# Patient Record
Sex: Male | Born: 1945 | Race: White | Hispanic: No | Marital: Married | Smoking: Never smoker
Health system: Southern US, Community
[De-identification: ages and names within clinical notes are randomized; demographics above are authoritative.]

## PROBLEM LIST (undated history)

## (undated) ENCOUNTER — Emergency Department (HOSPITAL_BASED_OUTPATIENT_CLINIC_OR_DEPARTMENT_OTHER): Payer: PPO

## (undated) DIAGNOSIS — R42 Dizziness and giddiness: Secondary | ICD-10-CM

## (undated) DIAGNOSIS — E7800 Pure hypercholesterolemia, unspecified: Secondary | ICD-10-CM

## (undated) DIAGNOSIS — R0789 Other chest pain: Secondary | ICD-10-CM

## (undated) DIAGNOSIS — R7303 Prediabetes: Secondary | ICD-10-CM

## (undated) DIAGNOSIS — N183 Chronic kidney disease, stage 3 unspecified: Secondary | ICD-10-CM

## (undated) DIAGNOSIS — I480 Paroxysmal atrial fibrillation: Secondary | ICD-10-CM

## (undated) DIAGNOSIS — I2510 Atherosclerotic heart disease of native coronary artery without angina pectoris: Secondary | ICD-10-CM

## (undated) DIAGNOSIS — L219 Seborrheic dermatitis, unspecified: Secondary | ICD-10-CM

## (undated) DIAGNOSIS — I499 Cardiac arrhythmia, unspecified: Secondary | ICD-10-CM

## (undated) DIAGNOSIS — IMO0001 Reserved for inherently not codable concepts without codable children: Secondary | ICD-10-CM

## (undated) DIAGNOSIS — K219 Gastro-esophageal reflux disease without esophagitis: Secondary | ICD-10-CM

## (undated) DIAGNOSIS — M109 Gout, unspecified: Secondary | ICD-10-CM

## (undated) DIAGNOSIS — C50911 Malignant neoplasm of unspecified site of right female breast: Secondary | ICD-10-CM

## (undated) DIAGNOSIS — M1990 Unspecified osteoarthritis, unspecified site: Secondary | ICD-10-CM

## (undated) DIAGNOSIS — I10 Essential (primary) hypertension: Secondary | ICD-10-CM

## (undated) DIAGNOSIS — D126 Benign neoplasm of colon, unspecified: Secondary | ICD-10-CM

## (undated) DIAGNOSIS — N529 Male erectile dysfunction, unspecified: Secondary | ICD-10-CM

## (undated) DIAGNOSIS — K5790 Diverticulosis of intestine, part unspecified, without perforation or abscess without bleeding: Secondary | ICD-10-CM

## (undated) DIAGNOSIS — I77810 Thoracic aortic ectasia: Secondary | ICD-10-CM

## (undated) DIAGNOSIS — I639 Cerebral infarction, unspecified: Secondary | ICD-10-CM

## (undated) DIAGNOSIS — Z973 Presence of spectacles and contact lenses: Secondary | ICD-10-CM

## (undated) DIAGNOSIS — N62 Hypertrophy of breast: Secondary | ICD-10-CM

## (undated) DIAGNOSIS — H9190 Unspecified hearing loss, unspecified ear: Secondary | ICD-10-CM

## (undated) DIAGNOSIS — B351 Tinea unguium: Secondary | ICD-10-CM

## (undated) DIAGNOSIS — Z974 Presence of external hearing-aid: Secondary | ICD-10-CM

## (undated) DIAGNOSIS — R519 Headache, unspecified: Secondary | ICD-10-CM

## (undated) DIAGNOSIS — R030 Elevated blood-pressure reading, without diagnosis of hypertension: Secondary | ICD-10-CM

## (undated) DIAGNOSIS — R51 Headache: Secondary | ICD-10-CM

## (undated) HISTORY — DX: Paroxysmal atrial fibrillation: I48.0

## (undated) HISTORY — DX: Chronic kidney disease, stage 3 (moderate): N18.3

## (undated) HISTORY — DX: Male erectile dysfunction, unspecified: N52.9

## (undated) HISTORY — DX: Unspecified osteoarthritis, unspecified site: M19.90

## (undated) HISTORY — PX: HIP ARTHROPLASTY: SHX981

## (undated) HISTORY — DX: Gout, unspecified: M10.9

## (undated) HISTORY — DX: Diverticulosis of intestine, part unspecified, without perforation or abscess without bleeding: K57.90

## (undated) HISTORY — DX: Pure hypercholesterolemia, unspecified: E78.00

## (undated) HISTORY — DX: Headache, unspecified: R51.9

## (undated) HISTORY — DX: Unspecified hearing loss, unspecified ear: H91.90

## (undated) HISTORY — DX: Gastro-esophageal reflux disease without esophagitis: K21.9

## (undated) HISTORY — DX: Elevated blood-pressure reading, without diagnosis of hypertension: R03.0

## (undated) HISTORY — DX: Dizziness and giddiness: R42

## (undated) HISTORY — DX: Thoracic aortic ectasia: I77.810

## (undated) HISTORY — DX: Atherosclerotic heart disease of native coronary artery without angina pectoris: I25.10

## (undated) HISTORY — DX: Headache: R51

## (undated) HISTORY — DX: Essential (primary) hypertension: I10

## (undated) HISTORY — DX: Chronic kidney disease, stage 3 unspecified: N18.30

## (undated) HISTORY — DX: Seborrheic dermatitis, unspecified: L21.9

## (undated) HISTORY — DX: Tinea unguium: B35.1

## (undated) HISTORY — DX: Reserved for inherently not codable concepts without codable children: IMO0001

## (undated) HISTORY — DX: Hypertrophy of breast: N62

## (undated) HISTORY — DX: Benign neoplasm of colon, unspecified: D12.6

## (undated) HISTORY — PX: KNEE ARTHROSCOPY: SUR90

## (undated) HISTORY — DX: Other chest pain: R07.89

## (undated) HISTORY — PX: OTHER SURGICAL HISTORY: SHX169

---

## 2003-09-22 ENCOUNTER — Ambulatory Visit (HOSPITAL_COMMUNITY): Admission: RE | Admit: 2003-09-22 | Discharge: 2003-09-22 | Payer: Self-pay | Admitting: Gastroenterology

## 2004-01-22 ENCOUNTER — Inpatient Hospital Stay (HOSPITAL_COMMUNITY): Admission: RE | Admit: 2004-01-22 | Discharge: 2004-01-25 | Payer: Self-pay | Admitting: Orthopedic Surgery

## 2004-08-28 ENCOUNTER — Inpatient Hospital Stay (HOSPITAL_COMMUNITY): Admission: RE | Admit: 2004-08-28 | Discharge: 2004-08-31 | Payer: Self-pay | Admitting: Orthopedic Surgery

## 2013-10-13 DIAGNOSIS — D126 Benign neoplasm of colon, unspecified: Secondary | ICD-10-CM

## 2013-10-13 HISTORY — DX: Benign neoplasm of colon, unspecified: D12.6

## 2014-08-09 ENCOUNTER — Other Ambulatory Visit: Payer: Self-pay | Admitting: Internal Medicine

## 2015-07-08 DIAGNOSIS — E785 Hyperlipidemia, unspecified: Secondary | ICD-10-CM | POA: Insufficient documentation

## 2015-07-08 DIAGNOSIS — R079 Chest pain, unspecified: Secondary | ICD-10-CM | POA: Insufficient documentation

## 2015-07-08 DIAGNOSIS — I10 Essential (primary) hypertension: Secondary | ICD-10-CM | POA: Insufficient documentation

## 2015-07-08 NOTE — Progress Notes (Signed)
Cardiology Office Note   Date:  07/11/2015   ID:  Logan Wright, DOB October 10, 1946, MRN [id number]  PCP:  Lottie Dawson, MD  Cardiologist:  Sinclair Grooms, MD   Chief Complaint  Patient presents with  . Chest Pain      History of Present Illness: Logan Wright is a 69 y.o. male who presents for CHEST PAIN. There is a history of hypertension, hyperlipidemia, and 20th ago a negative stress Cardiolite study.  Several months ago the patient was referred by the primary physician, Dr. Michail Sermon. He has been fleeting left parasternal chest discomfort. The discomfort is sharp. The discomfort is nonexertional. 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 Recent exertional tolerance has been slightly impaired compared to one to 2 years ago.   Past Medical History  Diagnosis Date  . Non-cardiac chest pain   . Gynecomastia     LEFT  . Hypercholesteremia   . Seborrhea   . Severe frontal headaches   . GERD (gastroesophageal reflux disease)   . DJD (degenerative joint disease)   . ED (erectile dysfunction)   . Diverticulosis   . Elevated blood pressure 2007-2008  . Vertigo   . Onychomycosis of foot with other complication   . Hearing loss   . Colon adenoma 2015  . OA (osteoarthritis)     LEFT SHOULDER  . Esophageal reflux   . HTN (hypertension)   . CKD (chronic kidney disease), stage III   . Gout     No past surgical history on file.   Current Outpatient Prescriptions  Medication Sig Dispense Refill  . aspirin EC 81 MG tablet Take 81 mg by mouth daily.    . colchicine 0.6 MG tablet Take 0.6 mg by mouth daily.    . colestipol (COLESTID) 1 G tablet Take 1 g by mouth 2 (two) times daily.    Marland Kitchen GLUCOS-MSM-C-MN-GINGER-WILLOW PO Take 1 tablet by mouth daily.     . hydrochlorothiazide (HYDRODIURIL) 25 MG tablet Take 25 mg by mouth daily.    . indomethacin (INDOCIN) 50 MG capsule Take 50 mg by mouth 3 (three) times daily with  meals.    . Multiple Vitamin (MULTIVITAMIN) tablet Take 1 tablet by mouth as directed.    . Omega-3 Fatty Acids (FISH OIL PO) Take 1,000 mg by mouth daily.    . pseudoephedrine (SUDAFED) 30 MG tablet Take 30 mg by mouth every 6 (six) hours as needed for congestion.    . simvastatin (ZOCOR) 20 MG tablet Take 20 mg by mouth daily.     No current facility-administered medications for this visit.    Allergies:   Review of patient's allergies indicates no known allergies.    Social History:  The patient  reports that he quit smoking about 50 years ago. He has never used smokeless tobacco. He reports that he does not drink alcohol or use illicit drugs.   Family History:  The patient's family history is not on file.    ROS:  Please see the history of present illness.   Otherwise, review of systems are positive for cramping, but no claudication, no neurological complaints, but significant weight gain. .   All other systems are reviewed and negative.    PHYSICAL EXAM: VS:  BP 160/90 mmHg  Pulse 64  Ht 5\' 7"  (1.702 m)  Wt 103.021 kg (227 lb 1.9 oz)  BMI 35.56 kg/m2 , BMI Body mass index is 35.56 kg/(m^2). GEN:  Well nourished, well developed, in no acute distress HEENT: normal Neck: no JVD, carotid bruits, or masses Cardiac: RRR.  There is no murmur, rub, or gallop. There is no edema. Respiratory:  clear to auscultation bilaterally, normal work of breathing. GI: soft, nontender, nondistended, + BS MS: no deformity or atrophy Skin: warm and dry, no rash Neuro:  Strength and sensation are intact Psych: euthymic mood, full affect   EKG:  EKG is ordered today. The ekg reveals normal sinus rhythm with nonspecific T-wave abnormality suggestive of hypokalemia   Recent Labs: No results found for requested labs within last 365 days.    Lipid Panel No results found for: CHOL, TRIG, HDL, CHOLHDL, VLDL, LDLCALC, LDLDIRECT    Wt Readings from Last 3 Encounters:  07/11/15 103.021 kg (227 lb  1.9 oz)      Other studies Reviewed: Additional studies/ records that were reviewed today inclde records from [place]. Findings include  previous negative stress Cardiolite 1996. Evidence of hyperlipidemia treated with non-statin therapy without significant control..    ASSESSMENT AND PLAN:  1. Other chest pain Atypical. Symptoms do not warrant ischemic evaluation  2. Essential hypertension Elevated and needs tighter control.   3. Hyperlipidemia  No recent data concerning levels on the current medical regimen    Current medicines are reviewed at length with the patient today.  The patient has the following concerns regarding medicines was discussed:  Potential detriments  of high-dose thiazide diuretic, can include hyperglycemia, hyperuricemia, and hypercalcemia.  The following changes/actions have been instituted:    Change antihypertensive regimen to Hyzaar 50/12.5 mg . This will allow [place] to decrease intensity of diuretic therapy.   Bmet in 1 month  Cautioned to call if any side effects.  We'll have blood pressure follow-up in the hypertension clinic in the next week.  Labs/ tests ordered today include:  No orders of the defined types were placed in this encounter.     Disposition:   FU with HS in 1 month  Signed, Sinclair Grooms, MD  07/11/2015 9:16 AM    [facility] [place], [place], [place]  [address] Phone: [phone]; Fax: [phone]

## 2015-07-09 DIAGNOSIS — R51 Headache: Secondary | ICD-10-CM

## 2015-07-09 DIAGNOSIS — E7800 Pure hypercholesterolemia, unspecified: Secondary | ICD-10-CM | POA: Insufficient documentation

## 2015-07-09 DIAGNOSIS — D126 Benign neoplasm of colon, unspecified: Secondary | ICD-10-CM | POA: Insufficient documentation

## 2015-07-09 DIAGNOSIS — B351 Tinea unguium: Secondary | ICD-10-CM | POA: Insufficient documentation

## 2015-07-09 DIAGNOSIS — R0789 Other chest pain: Secondary | ICD-10-CM | POA: Insufficient documentation

## 2015-07-09 DIAGNOSIS — M1990 Unspecified osteoarthritis, unspecified site: Secondary | ICD-10-CM | POA: Insufficient documentation

## 2015-07-09 DIAGNOSIS — N62 Hypertrophy of breast: Secondary | ICD-10-CM | POA: Insufficient documentation

## 2015-07-09 DIAGNOSIS — K219 Gastro-esophageal reflux disease without esophagitis: Secondary | ICD-10-CM | POA: Insufficient documentation

## 2015-07-09 DIAGNOSIS — R519 Headache, unspecified: Secondary | ICD-10-CM | POA: Insufficient documentation

## 2015-07-09 DIAGNOSIS — N529 Male erectile dysfunction, unspecified: Secondary | ICD-10-CM | POA: Insufficient documentation

## 2015-07-09 DIAGNOSIS — K5790 Diverticulosis of intestine, part unspecified, without perforation or abscess without bleeding: Secondary | ICD-10-CM | POA: Insufficient documentation

## 2015-07-09 DIAGNOSIS — R030 Elevated blood-pressure reading, without diagnosis of hypertension: Secondary | ICD-10-CM

## 2015-07-09 DIAGNOSIS — L219 Seborrheic dermatitis, unspecified: Secondary | ICD-10-CM | POA: Insufficient documentation

## 2015-07-09 DIAGNOSIS — H9190 Unspecified hearing loss, unspecified ear: Secondary | ICD-10-CM | POA: Insufficient documentation

## 2015-07-09 DIAGNOSIS — R42 Dizziness and giddiness: Secondary | ICD-10-CM | POA: Insufficient documentation

## 2015-07-09 DIAGNOSIS — IMO0001 Reserved for inherently not codable concepts without codable children: Secondary | ICD-10-CM | POA: Insufficient documentation

## 2015-07-11 ENCOUNTER — Ambulatory Visit (INDEPENDENT_AMBULATORY_CARE_PROVIDER_SITE_OTHER): Payer: Medicare Other | Admitting: Interventional Cardiology

## 2015-07-11 ENCOUNTER — Encounter: Payer: Self-pay | Admitting: Interventional Cardiology

## 2015-07-11 VITALS — BP 160/90 | HR 64 | Ht 67.0 in | Wt 227.1 lb

## 2015-07-11 DIAGNOSIS — I10 Essential (primary) hypertension: Secondary | ICD-10-CM

## 2015-07-11 DIAGNOSIS — E785 Hyperlipidemia, unspecified: Secondary | ICD-10-CM | POA: Diagnosis not present

## 2015-07-11 DIAGNOSIS — R0789 Other chest pain: Secondary | ICD-10-CM | POA: Diagnosis not present

## 2015-07-11 MED ORDER — LOSARTAN POTASSIUM-HCTZ 50-12.5 MG PO TABS: 1.0000 | ORAL_TABLET | Freq: Every day | ORAL | Status: DC

## 2015-07-11 NOTE — Patient Instructions (Addendum)
Medication Instructions:  Your physician has recommended you make the following change in your medication:  1) STOP Hctz 2) START Hyzaar 50-12.5mg  daily. An Rx has been sent to your pharmacy   Labwork: Your physician recommends that you return for lab work in: 1 month (Bmet)   Testing/Procedures: None ordered  Follow-Up: Your physician recommends that you schedule a follow-up appointment in: 1 month with a PA/NP   Any Other Special Instructions Will Be Listed Below (If Applicable).

## 2015-08-07 ENCOUNTER — Encounter: Payer: Self-pay | Admitting: Cardiology

## 2015-08-07 ENCOUNTER — Ambulatory Visit (INDEPENDENT_AMBULATORY_CARE_PROVIDER_SITE_OTHER): Payer: Medicare Other | Admitting: Cardiology

## 2015-08-07 ENCOUNTER — Other Ambulatory Visit (INDEPENDENT_AMBULATORY_CARE_PROVIDER_SITE_OTHER): Payer: Medicare Other | Admitting: *Deleted

## 2015-08-07 VITALS — BP 140/78 | HR 68 | Ht 67.0 in | Wt 233.0 lb

## 2015-08-07 DIAGNOSIS — E785 Hyperlipidemia, unspecified: Secondary | ICD-10-CM

## 2015-08-07 DIAGNOSIS — E7800 Pure hypercholesterolemia, unspecified: Secondary | ICD-10-CM

## 2015-08-07 DIAGNOSIS — I10 Essential (primary) hypertension: Secondary | ICD-10-CM

## 2015-08-07 LAB — BASIC METABOLIC PANEL
BUN: 23 mg/dL (ref 7–25)
CO2: 23 mmol/L (ref 20–31)
Calcium: 9.3 mg/dL (ref 8.6–10.3)
Chloride: 103 mmol/L (ref 98–110)
Creat: 1.41 mg/dL — ABNORMAL HIGH (ref 0.70–1.18)
Glucose, Bld: 182 mg/dL — ABNORMAL HIGH (ref 65–99)
Potassium: 3.9 mmol/L (ref 3.5–5.3)
Sodium: 137 mmol/L (ref 135–146)

## 2015-08-07 NOTE — Progress Notes (Signed)
08/07/2015 Logan Wright   1946-08-30  [id number]  Primary Physician Shamleffer, Herschell Dimes, MD Primary Cardiologist: Dr. Tamala Julian   Reason for Visit/CC: 1 Month follow-up for hypertension  HPI:  The patient is a 69 year old male followed by Dr. Tamala Julian who presents to clinic today for follow-up of his HTN. He also has a history of hyperlipidemia followed by his PCP. He was initially referred to Dr. Tamala Julian by his PCP 1 month ago for evaluation of chest pain. Per Dr. Thompson Caul note, his chest pain was felt to be atypical and did not warrant ischemic evaluation. However, at the time of his last office appointment his blood pressure was poorly controlled. His BP was 160/90. Dr. Tamala Julian adjusted his medications. He initiated Hyzaar 50/12.5 mg. The patient was instructed to follow-up in one month for a follow-up BMP as well as a blood pressure check.  He reports full compliance with his medicines. No side effects. He denies dizziness, HA, syncope/ near syncope. No further chest pain or dyspnea. His BP today is 140/68. He admits that he eats high sodium foods and notes moderate caffeine consumption. He dose not smoke. He exercises regularly but admits that he needs to loose more weight (mainly truncal obesity). He wishes to avoid additional medications, if possible.     Current Outpatient Prescriptions  Medication Sig Dispense Refill  . aspirin EC 81 MG tablet Take 81 mg by mouth daily.    . colchicine 0.6 MG tablet Take 0.6 mg by mouth as needed (GOUT).     . colestipol (COLESTID) 1 G tablet Take 1 g by mouth 2 (two) times daily.    Marland Kitchen GLUCOS-MSM-C-MN-GINGER-WILLOW PO Take 1 tablet by mouth daily.     . indomethacin (INDOCIN) 50 MG capsule Take 50 mg by mouth 3 (three) times daily as needed (GOUT).     Marland Kitchen losartan-hydrochlorothiazide (HYZAAR) 50-12.5 MG tablet Take 1 tablet by mouth daily. 30 tablet 5  . Multiple Vitamin (MULTIVITAMIN) tablet Take 1 tablet by mouth as directed.    . Omega-3 Fatty  Acids (FISH OIL PO) Take 1,000 mg by mouth daily.    . pseudoephedrine (SUDAFED) 30 MG tablet Take 30 mg by mouth every 6 (six) hours as needed for congestion.    . simvastatin (ZOCOR) 20 MG tablet Take 20 mg by mouth daily.    Marland Kitchen FLUZONE HIGH-DOSE 0.5 ML SUSY      No current facility-administered medications for this visit.    No Known Allergies  Social History   Social History  . Marital Status: Married    Spouse Name: N/A  . Number of Children: N/A  . Years of Education: N/A   Occupational History  . Not on file.   Social History Main Topics  . Smoking status: Former Smoker -- 0.25 packs/day    Quit date: 07/08/1965  . Smokeless tobacco: Never Used  . Alcohol Use: No  . Drug Use: No  . Sexual Activity: Not on file   Other Topics Concern  . Not on file   Social History Narrative     Review of Systems: General: negative for chills, fever, night sweats or weight changes.  Cardiovascular: negative for chest pain, dyspnea on exertion, edema, orthopnea, palpitations, paroxysmal nocturnal dyspnea or shortness of breath Dermatological: negative for rash Respiratory: negative for cough or wheezing Urologic: negative for hematuria Abdominal: negative for nausea, vomiting, diarrhea, bright red blood per rectum, melena, or hematemesis Neurologic: negative for visual changes, syncope, or dizziness All other systems reviewed and  are otherwise negative except as noted above.    Blood pressure 140/78, pulse 68, height 5\' 7"  (1.702 m), weight 233 lb (105.688 kg), SpO2 93 %.  General appearance: alert, cooperative and no distress Neck: no carotid bruit and no JVD Lungs: clear to auscultation bilaterally Heart: regular rate and rhythm, S1, S2 normal, no murmur, click, rub or gallop Extremities: no LEE Pulses: 2+ and symmetric Skin: warm and dry Neurologic: Grossly normal  EKG not preformed  ASSESSMENT AND PLAN:   1. HTN: BP improved with change to Hyzarr. Will check f/u  BMP today to asses renal function and K. He wishes to avoid addtion of a second agent if possible. He will try lifestyle modifications, through diet and weight loss for additional BP controlled. If feel that this is reasonable, since his BP is only mildly elevated at [id number] systolic (signfiicantly improved from 160 at last OV). We also discussed restricting sodium from his diet. His PCP will continue to follow his BP.  2. HLD: on statin therapy with simvastatin. Followed by his PCP.   3. Chest Pain: denies any further recurrence. Previous symptoms were atypical. He denies any exertional chest pain or dyspnea. Agree with Dr. Tamala Julian that no ischemic eval is warranted. Continue CAD risk factor modification.    PLAN  F/u as needed.   Lyda Jester PA-C 08/07/2015 9:40 AM

## 2015-08-07 NOTE — Addendum Note (Signed)
Addended by: Eulis Foster on: 08/07/2015 08:48 AM   Modules accepted: Orders

## 2015-08-07 NOTE — Patient Instructions (Addendum)
Medication Instructions:  Your physician recommends that you continue on your current medications as directed. Please refer to the Current Medication list given to you today.   Labwork: None ordered  Testing/Procedures: None ordered  Follow-Up: Your physician recommends that you schedule a follow-up appointment AS NEEDED WITH Dr Tamala Julian   Any Other Special Instructions Will Be Listed Below (If Applicable).     If you need a refill on your cardiac medications before your next appointment, please call your pharmacy.

## 2015-08-21 ENCOUNTER — Telehealth: Payer: Self-pay

## 2015-08-21 NOTE — Telephone Encounter (Signed)
2nd attempt to reach pt. Called to give pt lab results, and Dr.Smith's recommendation. Lmtcb. Message also via my chart

## 2015-08-22 ENCOUNTER — Telehealth: Payer: Self-pay | Admitting: Interventional Cardiology

## 2015-08-22 DIAGNOSIS — I10 Essential (primary) hypertension: Secondary | ICD-10-CM

## 2015-08-22 NOTE — Telephone Encounter (Signed)
Follow Up  Pt returned call for results.   Please call the mobile number

## 2015-08-23 NOTE — Telephone Encounter (Signed)
Pt has had an app f/u on 10/25 with Leland, PA. Pt will come to the office on 11/11 for a repeat bmet.

## 2015-08-24 ENCOUNTER — Other Ambulatory Visit (INDEPENDENT_AMBULATORY_CARE_PROVIDER_SITE_OTHER): Payer: Medicare Other | Admitting: *Deleted

## 2015-08-24 DIAGNOSIS — I10 Essential (primary) hypertension: Secondary | ICD-10-CM | POA: Diagnosis not present

## 2015-08-24 LAB — BASIC METABOLIC PANEL
BUN: 22 mg/dL (ref 7–25)
CO2: 22 mmol/L (ref 20–31)
Calcium: 9.2 mg/dL (ref 8.6–10.3)
Chloride: 103 mmol/L (ref 98–110)
Creat: 1.47 mg/dL — ABNORMAL HIGH (ref 0.70–1.18)
Glucose, Bld: 109 mg/dL — ABNORMAL HIGH (ref 65–99)
Potassium: 3.7 mmol/L (ref 3.5–5.3)
Sodium: 138 mmol/L (ref 135–146)

## 2015-08-24 NOTE — Addendum Note (Signed)
Addended by: Eulis Foster on: 08/24/2015 12:56 PM   Modules accepted: Orders

## 2016-01-10 ENCOUNTER — Other Ambulatory Visit: Payer: Self-pay | Admitting: Interventional Cardiology

## 2017-11-02 DIAGNOSIS — M109 Gout, unspecified: Secondary | ICD-10-CM | POA: Diagnosis not present

## 2017-11-02 DIAGNOSIS — E6609 Other obesity due to excess calories: Secondary | ICD-10-CM | POA: Diagnosis not present

## 2017-11-02 DIAGNOSIS — I10 Essential (primary) hypertension: Secondary | ICD-10-CM | POA: Diagnosis not present

## 2017-11-02 DIAGNOSIS — Z6834 Body mass index (BMI) 34.0-34.9, adult: Secondary | ICD-10-CM | POA: Diagnosis not present

## 2017-11-02 DIAGNOSIS — K219 Gastro-esophageal reflux disease without esophagitis: Secondary | ICD-10-CM | POA: Diagnosis not present

## 2017-11-02 DIAGNOSIS — E782 Mixed hyperlipidemia: Secondary | ICD-10-CM | POA: Diagnosis not present

## 2017-11-02 DIAGNOSIS — R739 Hyperglycemia, unspecified: Secondary | ICD-10-CM | POA: Diagnosis not present

## 2017-11-02 DIAGNOSIS — N183 Chronic kidney disease, stage 3 (moderate): Secondary | ICD-10-CM | POA: Diagnosis not present

## 2017-11-06 DIAGNOSIS — E6609 Other obesity due to excess calories: Secondary | ICD-10-CM | POA: Diagnosis not present

## 2017-11-06 DIAGNOSIS — E782 Mixed hyperlipidemia: Secondary | ICD-10-CM | POA: Diagnosis not present

## 2017-11-06 DIAGNOSIS — I10 Essential (primary) hypertension: Secondary | ICD-10-CM | POA: Diagnosis not present

## 2017-11-06 DIAGNOSIS — R739 Hyperglycemia, unspecified: Secondary | ICD-10-CM | POA: Diagnosis not present

## 2017-11-06 DIAGNOSIS — K219 Gastro-esophageal reflux disease without esophagitis: Secondary | ICD-10-CM | POA: Diagnosis not present

## 2017-11-06 DIAGNOSIS — M109 Gout, unspecified: Secondary | ICD-10-CM | POA: Diagnosis not present

## 2017-11-06 DIAGNOSIS — N183 Chronic kidney disease, stage 3 (moderate): Secondary | ICD-10-CM | POA: Diagnosis not present

## 2018-01-28 DIAGNOSIS — R0989 Other specified symptoms and signs involving the circulatory and respiratory systems: Secondary | ICD-10-CM | POA: Diagnosis not present

## 2018-01-28 DIAGNOSIS — E6609 Other obesity due to excess calories: Secondary | ICD-10-CM | POA: Diagnosis not present

## 2018-01-28 DIAGNOSIS — Z6835 Body mass index (BMI) 35.0-35.9, adult: Secondary | ICD-10-CM | POA: Diagnosis not present

## 2018-01-28 DIAGNOSIS — R05 Cough: Secondary | ICD-10-CM | POA: Diagnosis not present

## 2018-05-05 DIAGNOSIS — K219 Gastro-esophageal reflux disease without esophagitis: Secondary | ICD-10-CM | POA: Diagnosis not present

## 2018-05-05 DIAGNOSIS — N183 Chronic kidney disease, stage 3 (moderate): Secondary | ICD-10-CM | POA: Diagnosis not present

## 2018-05-05 DIAGNOSIS — Z0000 Encounter for general adult medical examination without abnormal findings: Secondary | ICD-10-CM | POA: Diagnosis not present

## 2018-05-05 DIAGNOSIS — E782 Mixed hyperlipidemia: Secondary | ICD-10-CM | POA: Diagnosis not present

## 2018-05-05 DIAGNOSIS — M1610 Unilateral primary osteoarthritis, unspecified hip: Secondary | ICD-10-CM | POA: Diagnosis not present

## 2018-05-05 DIAGNOSIS — Z125 Encounter for screening for malignant neoplasm of prostate: Secondary | ICD-10-CM | POA: Diagnosis not present

## 2018-05-05 DIAGNOSIS — Z6835 Body mass index (BMI) 35.0-35.9, adult: Secondary | ICD-10-CM | POA: Diagnosis not present

## 2018-05-05 DIAGNOSIS — Z1389 Encounter for screening for other disorder: Secondary | ICD-10-CM | POA: Diagnosis not present

## 2018-05-05 DIAGNOSIS — I10 Essential (primary) hypertension: Secondary | ICD-10-CM | POA: Diagnosis not present

## 2018-05-05 DIAGNOSIS — M1710 Unilateral primary osteoarthritis, unspecified knee: Secondary | ICD-10-CM | POA: Diagnosis not present

## 2018-05-05 DIAGNOSIS — M109 Gout, unspecified: Secondary | ICD-10-CM | POA: Diagnosis not present

## 2018-05-05 DIAGNOSIS — Z0184 Encounter for antibody response examination: Secondary | ICD-10-CM | POA: Diagnosis not present

## 2018-05-05 DIAGNOSIS — R739 Hyperglycemia, unspecified: Secondary | ICD-10-CM | POA: Diagnosis not present

## 2018-07-28 ENCOUNTER — Encounter: Payer: Self-pay | Admitting: Registered"

## 2018-08-18 DIAGNOSIS — L821 Other seborrheic keratosis: Secondary | ICD-10-CM | POA: Diagnosis not present

## 2018-08-18 DIAGNOSIS — D229 Melanocytic nevi, unspecified: Secondary | ICD-10-CM | POA: Diagnosis not present

## 2018-08-18 DIAGNOSIS — D485 Neoplasm of uncertain behavior of skin: Secondary | ICD-10-CM | POA: Diagnosis not present

## 2018-08-18 DIAGNOSIS — L820 Inflamed seborrheic keratosis: Secondary | ICD-10-CM | POA: Diagnosis not present

## 2018-08-18 DIAGNOSIS — B351 Tinea unguium: Secondary | ICD-10-CM | POA: Diagnosis not present

## 2018-08-23 DIAGNOSIS — I10 Essential (primary) hypertension: Secondary | ICD-10-CM | POA: Diagnosis not present

## 2018-08-23 DIAGNOSIS — M1710 Unilateral primary osteoarthritis, unspecified knee: Secondary | ICD-10-CM | POA: Diagnosis not present

## 2018-08-23 DIAGNOSIS — R739 Hyperglycemia, unspecified: Secondary | ICD-10-CM | POA: Diagnosis not present

## 2018-08-23 DIAGNOSIS — E782 Mixed hyperlipidemia: Secondary | ICD-10-CM | POA: Diagnosis not present

## 2018-08-23 DIAGNOSIS — M1990 Unspecified osteoarthritis, unspecified site: Secondary | ICD-10-CM | POA: Diagnosis not present

## 2018-08-23 DIAGNOSIS — H6123 Impacted cerumen, bilateral: Secondary | ICD-10-CM | POA: Diagnosis not present

## 2018-08-23 DIAGNOSIS — N183 Chronic kidney disease, stage 3 (moderate): Secondary | ICD-10-CM | POA: Diagnosis not present

## 2018-08-27 DIAGNOSIS — M25562 Pain in left knee: Secondary | ICD-10-CM | POA: Diagnosis not present

## 2018-08-27 DIAGNOSIS — M1711 Unilateral primary osteoarthritis, right knee: Secondary | ICD-10-CM | POA: Diagnosis not present

## 2018-08-27 DIAGNOSIS — M25561 Pain in right knee: Secondary | ICD-10-CM | POA: Diagnosis not present

## 2018-08-27 DIAGNOSIS — M1712 Unilateral primary osteoarthritis, left knee: Secondary | ICD-10-CM | POA: Diagnosis not present

## 2018-09-01 DIAGNOSIS — H524 Presbyopia: Secondary | ICD-10-CM | POA: Diagnosis not present

## 2018-09-01 DIAGNOSIS — H40013 Open angle with borderline findings, low risk, bilateral: Secondary | ICD-10-CM | POA: Diagnosis not present

## 2018-09-01 DIAGNOSIS — H2513 Age-related nuclear cataract, bilateral: Secondary | ICD-10-CM | POA: Diagnosis not present

## 2018-09-01 DIAGNOSIS — H25043 Posterior subcapsular polar age-related cataract, bilateral: Secondary | ICD-10-CM | POA: Diagnosis not present

## 2018-09-29 DIAGNOSIS — M1712 Unilateral primary osteoarthritis, left knee: Secondary | ICD-10-CM | POA: Diagnosis not present

## 2018-09-29 DIAGNOSIS — M25561 Pain in right knee: Secondary | ICD-10-CM | POA: Diagnosis not present

## 2018-09-29 DIAGNOSIS — M25562 Pain in left knee: Secondary | ICD-10-CM | POA: Diagnosis not present

## 2018-11-05 DIAGNOSIS — N183 Chronic kidney disease, stage 3 (moderate): Secondary | ICD-10-CM | POA: Diagnosis not present

## 2018-11-05 DIAGNOSIS — M1710 Unilateral primary osteoarthritis, unspecified knee: Secondary | ICD-10-CM | POA: Diagnosis not present

## 2018-11-05 DIAGNOSIS — I10 Essential (primary) hypertension: Secondary | ICD-10-CM | POA: Diagnosis not present

## 2018-11-05 DIAGNOSIS — R739 Hyperglycemia, unspecified: Secondary | ICD-10-CM | POA: Diagnosis not present

## 2018-11-05 DIAGNOSIS — E782 Mixed hyperlipidemia: Secondary | ICD-10-CM | POA: Diagnosis not present

## 2018-11-08 DIAGNOSIS — M1712 Unilateral primary osteoarthritis, left knee: Secondary | ICD-10-CM | POA: Diagnosis not present

## 2018-11-08 NOTE — H&P (Signed)
TOTAL KNEE ADMISSION H&P  Patient is being admitted for left total knee arthroplasty.  Subjective:  Chief Complaint:   Left knee primary OA / pain  HPI: Logan Wright, 73 y.o. male, has a history of pain and functional disability in the left knee due to arthritis and has failed non-surgical conservative treatments for greater than 12 weeks to include NSAID's and/or analgesics, corticosteriod injections and activity modification.  Onset of symptoms was gradual, starting 1+ years ago with gradually worsening course since that time. The patient noted prior procedures on the knee to include  arthroscopy on the left knee.  Patient currently rates pain in the left knee(s) at 7 out of 10 with activity. Patient has worsening of pain with activity and weight bearing, pain that interferes with activities of daily living, pain with passive range of motion, crepitus and joint swelling.  Patient has evidence of periarticular osteophytes and joint space narrowing by imaging studies.  There is no active infection.   Risks, benefits and expectations were discussed with the patient.  Risks including but not limited to the risk of anesthesia, blood clots, nerve damage, blood vessel damage, failure of the prosthesis, infection and up to and including death.  Patient understand the risks, benefits and expectations and wishes to proceed with surgery.   PCP: Shamleffer, Melanie Crazier, MD  D/C Plans:       Home   Post-op Meds:       No Rx given  Tranexamic Acid:      To be given - IV   Decadron:      Is to be given  FYI:      ASA  Norco  DME:    Rx given for - RW & 3-n-1  PT:    OPPT    Patient Active Problem List   Diagnosis Date Noted  . Non-cardiac chest pain   . Gynecomastia   . Hypercholesteremia   . Seborrhea   . Severe frontal headaches   . GERD (gastroesophageal reflux disease)   . ED (erectile dysfunction)   . Diverticulosis   . Elevated blood pressure   . Vertigo   . Onychomycosis of foot  with other complication   . Hearing loss   . Colon adenoma   . OA (osteoarthritis)   . Esophageal reflux   . Chest pain 07/08/2015  . Essential hypertension 07/08/2015  . Hyperlipidemia 07/08/2015   Past Medical History:  Diagnosis Date  . CKD (chronic kidney disease), stage III   . Colon adenoma 2015  . Diverticulosis   . DJD (degenerative joint disease)   . ED (erectile dysfunction)   . Elevated blood pressure 2007-2008  . Esophageal reflux   . GERD (gastroesophageal reflux disease)   . Gout   . Gynecomastia    LEFT  . Hearing loss   . HTN (hypertension)   . Hypercholesteremia   . Non-cardiac chest pain   . OA (osteoarthritis)    LEFT SHOULDER  . Onychomycosis of foot with other complication   . Seborrhea   . Severe frontal headaches   . Vertigo     No past surgical history on file.  No current facility-administered medications for this encounter.    Current Outpatient Medications  Medication Sig Dispense Refill Last Dose  . aspirin EC 81 MG tablet Take 81 mg by mouth daily.   Taking  . colchicine 0.6 MG tablet Take 0.6 mg by mouth as needed (GOUT).    Taking  . colestipol (COLESTID) 1  G tablet Take 1 g by mouth 2 (two) times daily.   Taking  . FLUZONE HIGH-DOSE 0.5 ML SUSY      . GLUCOS-MSM-C-MN-GINGER-WILLOW PO Take 1 tablet by mouth daily.    Taking  . indomethacin (INDOCIN) 50 MG capsule Take 50 mg by mouth 3 (three) times daily as needed (GOUT).    Taking  . losartan-hydrochlorothiazide (HYZAAR) 50-12.5 MG tablet TAKE 1 TABLET BY MOUTH DAILY. 30 tablet 5   . Multiple Vitamin (MULTIVITAMIN) tablet Take 1 tablet by mouth as directed.   Taking  . Omega-3 Fatty Acids (FISH OIL PO) Take 1,000 mg by mouth daily.   Taking  . pseudoephedrine (SUDAFED) 30 MG tablet Take 30 mg by mouth every 6 (six) hours as needed for congestion.   Taking  . simvastatin (ZOCOR) 20 MG tablet Take 20 mg by mouth daily.   Taking   No Known Allergies   Social History   Tobacco Use  .  Smoking status: Former Smoker    Packs/day: 0.25    Last attempt to quit: 07/08/1965    Years since quitting: 53.3  . Smokeless tobacco: Never Used  Substance Use Topics  . Alcohol use: No    Alcohol/week: 0.0 standard drinks    Family History  Problem Relation Age of Onset  . Heart failure Father        21  . Heart attack Mother   . Stroke Mother   . Hypertension Neg Hx      Review of Systems  Constitutional: Negative.   HENT: Positive for hearing loss.   Eyes: Negative.   Respiratory: Negative.   Cardiovascular: Negative.   Gastrointestinal: Positive for heartburn.  Genitourinary: Negative.   Musculoskeletal: Positive for joint pain.  Skin: Negative.   Neurological: Positive for headaches.  Endo/Heme/Allergies: Negative.   Psychiatric/Behavioral: Negative.     Objective:  Physical Exam  Constitutional: He is oriented to person, place, and time. He appears well-developed.  HENT:  Head: Normocephalic.  Eyes: Pupils are equal, round, and reactive to light.  Neck: Neck supple. No JVD present. No tracheal deviation present. No thyromegaly present.  Cardiovascular: Normal rate, regular rhythm and intact distal pulses.  Respiratory: Effort normal and breath sounds normal. No respiratory distress. He has no wheezes.  GI: Soft. There is no abdominal tenderness. There is no guarding.  Musculoskeletal:     Left knee: He exhibits decreased range of motion, swelling and bony tenderness. He exhibits no ecchymosis, no deformity, no laceration and no erythema. Tenderness found.  Lymphadenopathy:    He has no cervical adenopathy.  Neurological: He is alert and oriented to person, place, and time.  Skin: Skin is warm and dry.  Psychiatric: He has a normal mood and affect.      Labs:  Estimated body mass index is 36.49 kg/m as calculated from the following:   Height as of 08/07/15: 5\' 7"  (1.702 m).   Weight as of 08/07/15: 105.7 kg.   Imaging Review Plain radiographs  demonstrate severe degenerative joint disease of the left knee. The overall alignment isneutral. The bone quality appears to be good for age and reported activity level.   Preoperative templating of the joint replacement has been completed, documented, and submitted to the Operating Room personnel in order to optimize intra-operative equipment management.    Patient's anticipated LOS is less than 2 midnights, meeting these requirements: - Lives within 1 hour of care - Has a competent adult at home to recover with post-op recover -  NO history of  - Chronic pain requiring opiods  - Diabetes  - Coronary Artery Disease  - Heart failure  - Heart attack  - Stroke  - DVT/VTE  - Cardiac arrhythmia  - Respiratory Failure/COPD  - Renal failure  - Anemia  - Advanced Liver disease    Assessment/Plan:  End stage arthritis, left knee   The patient history, physical examination, clinical judgment of the provider and imaging studies are consistent with end stage degenerative joint disease of the left knee(s) and total knee arthroplasty is deemed medically necessary. The treatment options including medical management, injection therapy arthroscopy and arthroplasty were discussed at length. The risks and benefits of total knee arthroplasty were presented and reviewed. The risks due to aseptic loosening, infection, stiffness, patella tracking problems, thromboembolic complications and other imponderables were discussed. The patient acknowledged the explanation, agreed to proceed with the plan and consent was signed. Patient is being admitted for inpatient treatment for surgery, pain control, PT, OT, prophylactic antibiotics, VTE prophylaxis, progressive ambulation and ADL's and discharge planning. The patient is planning to be discharged home.     West Pugh Chizaram Latino   PA-C  11/08/2018, 11:16 AM

## 2018-11-16 NOTE — Patient Instructions (Addendum)
Logan Wright  11/16/2018   Your procedure is scheduled on:  11-23-2018    Report to [facility] Main  Entrance     Report to admitting at 5:30AM    Call this number if you have problems the morning of surgery [phone]     Remember: Do not eat food or drink liquids :After Midnight. BRUSH YOUR TEETH MORNING OF SURGERY AND RINSE YOUR MOUTH OUT, NO CHEWING GUM CANDY OR MINTS.     Take these medicines the morning of surgery with A SIP OF WATER: ALLOPURINOL, AMLODIPINE                                You may not have any metal on your body including hair pins and              piercings  Do not wear jewelry, make-up, lotions, powders or perfumes, deodorant                     Men may shave face and neck.   Do not bring valuables to the hospital. [place].  Contacts, dentures or bridgework may not be worn into surgery.  Leave suitcase in the car. After surgery it may be brought to your room.                  Please read over the following fact sheets you were given: _____________________________________________________________________             [facility] - Preparing for Surgery Before surgery, you can play an important role.  Because skin is not sterile, your skin needs to be as free of germs as possible.  You can reduce the number of germs on your skin by washing with CHG (chlorahexidine gluconate) soap before surgery.  CHG is an antiseptic cleaner which kills germs and bonds with the skin to continue killing germs even after washing. Please DO NOT use if you have an allergy to CHG or antibacterial soaps.  If your skin becomes reddened/irritated stop using the CHG and inform your nurse when you arrive at Short Stay. Do not shave (including legs and underarms) for at least 48 hours prior to the first CHG shower.  You may shave your face/neck. Please follow these instructions carefully:  1.  Shower with  CHG Soap the night before surgery and the  morning of Surgery.  2.  If you choose to wash your hair, wash your hair first as usual with your  normal  shampoo.  3.  After you shampoo, rinse your hair and body thoroughly to remove the  shampoo.                           4.  Use CHG as you would any other liquid soap.  You can apply chg directly  to the skin and wash                       Gently with a scrungie or clean washcloth.  5.  Apply the CHG Soap to your body ONLY FROM THE NECK DOWN.   Do not use on face/ open  Wound or open sores. Avoid contact with eyes, ears mouth and genitals (private parts).                       Wash face,  Genitals (private parts) with your normal soap.             6.  Wash thoroughly, paying special attention to the area where your surgery  will be performed.  7.  Thoroughly rinse your body with warm water from the neck down.  8.  DO NOT shower/wash with your normal soap after using and rinsing off  the CHG Soap.                9.  Pat yourself dry with a clean towel.            10.  Wear clean pajamas.            11.  Place clean sheets on your bed the night of your first shower and do not  sleep with pets. Day of Surgery : Do not apply any lotions/deodorants the morning of surgery.  Please wear clean clothes to the hospital/surgery center.  FAILURE TO FOLLOW THESE INSTRUCTIONS MAY RESULT IN THE CANCELLATION OF YOUR SURGERY PATIENT SIGNATURE_________________________________  NURSE SIGNATURE__________________________________  ________________________________________________________________________   Adam Phenix  An incentive spirometer is a tool that can help keep your lungs clear and active. This tool measures how well you are filling your lungs with each breath. Taking long deep breaths may help reverse or decrease the chance of developing breathing (pulmonary) problems (especially infection) following:  A long period of  time when you are unable to move or be active. BEFORE THE PROCEDURE   If the spirometer includes an indicator to show your best effort, your nurse or respiratory therapist will set it to a desired goal.  If possible, sit up straight or lean slightly forward. Try not to slouch.  Hold the incentive spirometer in an upright position. INSTRUCTIONS FOR USE  1. Sit on the edge of your bed if possible, or sit up as far as you can in bed or on a chair. 2. Hold the incentive spirometer in an upright position. 3. Breathe out normally. 4. Place the mouthpiece in your mouth and seal your lips tightly around it. 5. Breathe in slowly and as deeply as possible, raising the piston or the ball toward the top of the column. 6. Hold your breath for 3-5 seconds or for as long as possible. Allow the piston or ball to fall to the bottom of the column. 7. Remove the mouthpiece from your mouth and breathe out normally. 8. Rest for a few seconds and repeat Steps 1 through 7 at least 10 times every 1-2 hours when you are awake. Take your time and take a few normal breaths between deep breaths. 9. The spirometer may include an indicator to show your best effort. Use the indicator as a goal to work toward during each repetition. 10. After each set of 10 deep breaths, practice coughing to be sure your lungs are clear. If you have an incision (the cut made at the time of surgery), support your incision when coughing by placing a pillow or rolled up towels firmly against it. Once you are able to get out of bed, walk around indoors and cough well. You may stop using the incentive spirometer when instructed by your caregiver.  RISKS AND COMPLICATIONS  Take your time so you do not get  dizzy or light-headed.  If you are in pain, you may need to take or ask for pain medication before doing incentive spirometry. It is harder to take a deep breath if you are having pain. AFTER USE  Rest and breathe slowly and easily.  It can  be helpful to keep track of a log of your progress. Your caregiver can provide you with a simple table to help with this. If you are using the spirometer at home, follow these instructions: [place] IF:   You are having difficultly using the spirometer.  You have trouble using the spirometer as often as instructed.  Your pain medication is not giving enough relief while using the spirometer.  You develop fever of 100.5 F (38.1 C) or higher. SEEK IMMEDIATE MEDICAL CARE IF:   You cough up bloody sputum that had not been present before.  You develop fever of 102 F (38.9 C) or greater.  You develop worsening pain at or near the incision site. MAKE SURE YOU:   Understand these instructions.  Will watch your condition.  Will get help right away if you are not doing well or get worse. Document Released: 02/09/2007 Document Revised: 12/22/2011 Document Reviewed: 04/12/2007 ExitCare Patient Information 2014 ExitCare, [place].   ________________________________________________________________________  WHAT IS A BLOOD TRANSFUSION? Blood Transfusion Information  A transfusion is the replacement of blood or some of its parts. Blood is made up of multiple cells which provide different functions.  Red blood cells carry oxygen and are used for blood loss replacement.  White blood cells fight against infection.  Platelets control bleeding.  Plasma helps clot blood.  Other blood products are available for specialized needs, such as hemophilia or other clotting disorders. BEFORE THE TRANSFUSION  Who gives blood for transfusions?   Healthy volunteers who are fully evaluated to make sure their blood is safe. This is blood bank blood. Transfusion therapy is the safest it has ever been in the practice of medicine. Before blood is taken from a donor, a complete history is taken to make sure that person has no history of diseases nor engages in risky social behavior (examples are  intravenous drug use or sexual activity with multiple partners). The donor's travel history is screened to minimize risk of transmitting infections, such as malaria. The donated blood is tested for signs of infectious diseases, such as HIV and hepatitis. The blood is then tested to be sure it is compatible with you in order to minimize the chance of a transfusion reaction. If you or a relative donates blood, this is often done in anticipation of surgery and is not appropriate for emergency situations. It takes many days to process the donated blood. RISKS AND COMPLICATIONS Although transfusion therapy is very safe and saves many lives, the main dangers of transfusion include:   Getting an infectious disease.  Developing a transfusion reaction. This is an allergic reaction to something in the blood you were given. Every precaution is taken to prevent this. The decision to have a blood transfusion has been considered carefully by your caregiver before blood is given. Blood is not given unless the benefits outweigh the risks. AFTER THE TRANSFUSION  Right after receiving a blood transfusion, you will usually feel much better and more energetic. This is especially true if your red blood cells have gotten low (anemic). The transfusion raises the level of the red blood cells which carry oxygen, and this usually causes an energy increase.  The nurse administering the transfusion will  monitor you carefully for complications. HOME CARE INSTRUCTIONS  No special instructions are needed after a transfusion. You may find your energy is better. Speak with your caregiver about any limitations on activity for underlying diseases you may have. SEEK MEDICAL CARE IF:   Your condition is not improving after your transfusion.  You develop redness or irritation at the intravenous (IV) site. SEEK IMMEDIATE MEDICAL CARE IF:  Any of the following symptoms occur over the next 12 hours:  Shaking chills.  You have a  temperature by mouth above 102 F (38.9 C), not controlled by medicine.  Chest, back, or muscle pain.  People around you feel you are not acting correctly or are confused.  Shortness of breath or difficulty breathing.  Dizziness and fainting.  You get a rash or develop hives.  You have a decrease in urine output.  Your urine turns a dark color or changes to pink, red, or brown. Any of the following symptoms occur over the next 10 days:  You have a temperature by mouth above 102 F (38.9 C), not controlled by medicine.  Shortness of breath.  Weakness after normal activity.  The white part of the eye turns yellow (jaundice).  You have a decrease in the amount of urine or are urinating less often.  Your urine turns a dark color or changes to pink, red, or brown. Document Released: 09/26/2000 Document Revised: 12/22/2011 Document Reviewed: 05/15/2008 [facility] Patient Information 2014 [place], [place].  _______________________________________________________________________

## 2018-11-18 ENCOUNTER — Other Ambulatory Visit: Payer: Self-pay

## 2018-11-18 ENCOUNTER — Encounter (HOSPITAL_COMMUNITY)
Admission: RE | Admit: 2018-11-18 | Discharge: 2018-11-18 | Disposition: A | Discharge status: Home / Self Care | Payer: PPO | Source: Ambulatory Visit | Attending: Orthopedic Surgery | Admitting: Orthopedic Surgery

## 2018-11-18 ENCOUNTER — Encounter (HOSPITAL_COMMUNITY): Payer: Self-pay

## 2018-11-18 DIAGNOSIS — Z01818 Encounter for other preprocedural examination: Secondary | ICD-10-CM | POA: Diagnosis not present

## 2018-11-18 HISTORY — DX: Prediabetes: R73.03

## 2018-11-18 HISTORY — DX: Presence of external hearing-aid: Z97.4

## 2018-11-18 LAB — CBC
HCT: 49 % (ref 39.0–52.0)
Hemoglobin: 15.5 g/dL (ref 13.0–17.0)
MCH: 28.1 pg (ref 26.0–34.0)
MCHC: 31.6 g/dL (ref 30.0–36.0)
MCV: 88.8 fL (ref 80.0–100.0)
Platelets: 253 10*3/uL (ref 150–400)
RBC: 5.52 MIL/uL (ref 4.22–5.81)
RDW: 14.4 % (ref 11.5–15.5)
WBC: 9.5 10*3/uL (ref 4.0–10.5)
nRBC: 0 % (ref 0.0–0.2)

## 2018-11-18 LAB — ABO/RH: ABO/RH(D): B POS

## 2018-11-18 LAB — BASIC METABOLIC PANEL
Anion gap: 9 (ref 5–15)
BUN: 21 mg/dL (ref 8–23)
CO2: 25 mmol/L (ref 22–32)
Calcium: 9.1 mg/dL (ref 8.9–10.3)
Chloride: 103 mmol/L (ref 98–111)
Creatinine, Ser: 1.32 mg/dL — ABNORMAL HIGH (ref 0.61–1.24)
GFR calc Af Amer: >60 mL/min (ref >60)
GFR calc non Af Amer: 53 mL/min — ABNORMAL LOW (ref >60)
Glucose, Bld: 92 mg/dL (ref 70–99)
Potassium: 4 mmol/L (ref 3.5–5.1)
Sodium: 137 mmol/L (ref 135–145)

## 2018-11-18 LAB — SURGICAL PCR SCREEN
MRSA, PCR: NEGATIVE
Staphylococcus aureus: NEGATIVE

## 2018-11-18 LAB — HEMOGLOBIN A1C
Hgb A1c MFr Bld: 6.3 % — ABNORMAL HIGH (ref 4.8–5.6)
Mean Plasma Glucose: 134.11 mg/dL

## 2018-11-18 NOTE — Progress Notes (Addendum)
Anesthesia Chart Review   Case:  [id number] Date/Time:  11/23/18 0700   Procedure:  TOTAL KNEE ARTHROPLASTY (Left ) - 70 mins   Anesthesia type:  Spinal   Pre-op diagnosis:  Left knee osteoarthritis   Location:  [place] ROOM 09 / [facility] ORS   Surgeon:  Paralee Cancel, MD      DISCUSSION: 73 yo former smoker (quit 07/08/65) with h/o GERD, HTN, CKD Stage III, pre-diabetics, left knee OA scheduled for above surgery on 11/23/2018 with Dr. Paralee Cancel.   Clearance received from PCP, Dr. Yaakov Guthrie, which states pt is moderate risk due to multiple risk factors including pre-diabetes, age, morbid obesity, hyperlipidemia, HTN, CKD.  Per his clearance Dr. Jacelyn Grip states he requires cardiac clearance.    Dr. Aurea Graff office made aware.   Addendum 11/22/2018: Pt seen by cardiologist, Dr. Mertie Moores, 11/22/2018 as requested by PCP.  Per his note, "BP has been well controlled. He does not have any cardiac issues.  He is at low risk for vascular complications with his upcoming left total knee replacement."  Pt can proceed with planned procedure barring acute status change.  VS: BP 138/78 (BP Location: Right Arm)   Pulse 61   Temp 37 C (Oral)   Resp 18   Ht 5\' 6"  (1.676 m)   Wt 103.5 kg   SpO2 96%   BMI 36.83 kg/m   PROVIDERS: [name], MD is PCP   Daneen Schick, MD is Cardiologist  LABS: Labs reviewed: Acceptable for surgery. (all labs ordered are listed, but only abnormal results are displayed)  Labs Reviewed  BASIC METABOLIC PANEL - Abnormal; Notable for the following components:      Result Value   Creatinine, Ser 1.32 (*)    GFR calc non Af Amer 53 (*)    All other components within normal limits  HEMOGLOBIN A1C - Abnormal; Notable for the following components:   Hgb A1c MFr Bld 6.3 (*)    All other components within normal limits  SURGICAL PCR SCREEN  CBC  TYPE AND SCREEN  ABO/RH     IMAGES:   EKG: 11/18/2018 Rate 60 bpm Sinus rhythm with Premature atrial complexes in a  pattern of bigeminy Minimal voltage criteria for LVH, may be normal variant Borderline ECG   CV:  Past Medical History:  Diagnosis Date  . CKD (chronic kidney disease), stage III ([place])   . Colon adenoma 2015  . Diverticulosis    DENIES   . DJD (degenerative joint disease)   . ED (erectile dysfunction)   . Elevated blood pressure 2007-2008  . Esophageal reflux   . GERD (gastroesophageal reflux disease)   . Gout   . Gynecomastia    LEFT  . Hearing loss   . HTN (hypertension)   . Hypercholesteremia   . Non-cardiac chest pain   . OA (osteoarthritis)    LEFT SHOULDER  . Onychomycosis of foot with other complication   . Pre-diabetes   . Seborrhea   . Severe frontal headaches    RESOLVED   . Vertigo    RESOLVED   . Wears hearing aid     Past Surgical History:  Procedure Laterality Date  . COLONSCOPY     . HIP ARTHROPLASTY Bilateral    AT [place]   . KNEE ARTHROSCOPY     UNSURE WHICH KNEE     MEDICATIONS: . allopurinol (ZYLOPRIM) 100 MG tablet  . amLODipine (NORVASC) 2.5 MG tablet  . aspirin EC 81 MG tablet  .  atorvastatin (LIPITOR) 40 MG tablet  . GLUCOSAMINE-CHONDROITIN PO  . losartan-hydrochlorothiazide (HYZAAR) 50-12.5 MG tablet  . Multiple Vitamin (MULTIVITAMIN) tablet  . Omega-3 Fatty Acids (FISH OIL PO)  . Potassium 99 MG TABS   No current facility-administered medications for this encounter.     [facility] Pre-Surgical Testing [phone] 11/18/18 4:14 PM

## 2018-11-19 ENCOUNTER — Telehealth: Payer: Self-pay | Admitting: *Deleted

## 2018-11-19 NOTE — Telephone Encounter (Signed)
PT IS NEW PT.. SCHEDULED TO SEE DR. [name] 11/22/2018.       [facility] Pre-operative Risk Assessment    Request for surgical clearance:  1. What type of surgery is being performed? LEFT TOTAL KNEE   2. When is this surgery scheduled?  11/23/18   3. What type of clearance is required (medical clearance vs. Pharmacy clearance to hold med vs. Both)?  MEDICAL  4. Are there any medications that need to be held prior to surgery and how long? N/A   5. Practice name and name of physician performing surgery?  [facility] / DR, OLIN   6. What is your office phone number [phone]    7.   What is your office fax number [phone]  8.   Anesthesia type (None, local, MAC, general) ?  SPINAL    Logan Wright 11/19/2018, 2:12 PM  _________________________________________________________________   (provider comments below)

## 2018-11-22 ENCOUNTER — Ambulatory Visit (INDEPENDENT_AMBULATORY_CARE_PROVIDER_SITE_OTHER): Payer: PPO | Admitting: Cardiovascular Disease

## 2018-11-22 ENCOUNTER — Encounter: Payer: Self-pay | Admitting: Cardiovascular Disease

## 2018-11-22 VITALS — BP 128/80 | HR 75 | Ht 66.0 in | Wt 230.1 lb

## 2018-11-22 DIAGNOSIS — I10 Essential (primary) hypertension: Secondary | ICD-10-CM | POA: Diagnosis not present

## 2018-11-22 NOTE — Patient Instructions (Signed)
Medication Instructions:  Your physician recommends that you continue on your current medications as directed. Please refer to the Current Medication list given to you today.  If you need a refill on your cardiac medications before your next appointment, please call your pharmacy.    Lab work: None Ordered    Testing/Procedures: None Ordered    Follow-Up: At [facility], you and your health needs are our priority.  As part of our continuing mission to provide you with exceptional heart care, we have created designated Provider Care Teams.  These Care Teams include your primary Cardiologist (physician) and Advanced Practice Providers (APPs -  Physician Assistants and Nurse Practitioners) who all work together to provide you with the care you need, when you need it. You will need a follow up appointment in:   as needed.  You may see Dr. Acie Fredrickson or one of the following Advanced Practice Providers on your designated Care Team: Richardson Dopp, PA-C [place], [place] . Daune Perch, NP

## 2018-11-22 NOTE — Progress Notes (Signed)
Cardiology Office Note:    Date:  11/22/2018   ID:  Logan Wright, DOB [birth year], MRN [id number]  PCP:  [name], MD  Cardiologist:  No primary care provider on file.  Electrophysiologist:  None   Referring MD: Paralee Cancel, MD   Chief Complaint  Patient presents with  . Hypertension     Feb. 10, 2020    Logan Wright is a 73 y.o. male with a hx of hypertension and atypical chest pain.  Asked to see him today for preoperative evaluation  By Dr. Alvan Dame prior to left total knee replacement.  Has seen Dr. Tamala Julian in the past ( at his request)  Now he was told that he needs cardiac clearence.   Exercises regularly .   Aerobic and weight lifting . Does a boot camp several times a week.  He averages exercising 5 times a week. Recently has been having some left knee problems so he is been cycling more.  Not any chest pain or shortness of breath while exercising.  No syncope or presyncope. He has held his aspirin for the past 4 days. Has also been taking Turmeric.     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    Past Medical History:  Diagnosis Date  . CKD (chronic kidney disease), stage III ([place])   . Colon adenoma 2015  . Diverticulosis    DENIES   . DJD (degenerative joint disease)   . ED (erectile dysfunction)   . Elevated blood pressure 2007-2008  . Esophageal reflux   . GERD (gastroesophageal reflux disease)   . Gout   . Gynecomastia    LEFT  . Hearing loss   . HTN (hypertension)   . Hypercholesteremia   . Non-cardiac chest pain   . OA (osteoarthritis)    LEFT SHOULDER  . Onychomycosis of foot with other complication   . Pre-diabetes   . Seborrhea   . Severe frontal headaches    RESOLVED   . Vertigo    RESOLVED   . Wears hearing aid     Past Surgical History:  Procedure Laterality Date  . COLONSCOPY     . HIP ARTHROPLASTY Bilateral    AT [place]   . KNEE ARTHROSCOPY     UNSURE WHICH KNEE     Current Medications: Current  Meds  Medication Sig  . allopurinol (ZYLOPRIM) 100 MG tablet Take 200 mg by mouth daily.  Marland Kitchen amLODipine (NORVASC) 2.5 MG tablet Take 2.5 mg by mouth daily.  Marland Kitchen aspirin EC 81 MG tablet Take 81 mg by mouth daily.  Marland Kitchen atorvastatin (LIPITOR) 40 MG tablet Take 40 mg by mouth daily.  Marland Kitchen GLUCOSAMINE-CHONDROITIN PO Take 2 tablets by mouth daily.  Marland Kitchen losartan-hydrochlorothiazide (HYZAAR) 50-12.5 MG tablet TAKE 1 TABLET BY MOUTH DAILY.  . Multiple Vitamin (MULTIVITAMIN) tablet Take 1 tablet by mouth daily.   . Omega-3 Fatty Acids (FISH OIL PO) Take 2,800 mg by mouth daily.   . Potassium 99 MG TABS Take 99 mg by mouth daily.     Allergies:   Patient has no known allergies.   Social History   Socioeconomic History  . Marital status: Married    Spouse name: Not on file  . Number of children: Not on file  . Years of education: Not on file  . Highest education level: Not on file  Occupational History  . Not on file  Social Needs  . Financial resource strain: Not on file  .  Food insecurity:    Worry: Not on file    Inability: Not on file  . Transportation needs:    Medical: Not on file    Non-medical: Not on file  Tobacco Use  . Smoking status: Former Smoker    Packs/day: 0.25    Last attempt to quit: 07/08/1965    Years since quitting: 53.4  . Smokeless tobacco: Never Used  Substance and Sexual Activity  . Alcohol use: No    Alcohol/week: 0.0 standard drinks  . Drug use: No  . Sexual activity: Not on file  Lifestyle  . Physical activity:    Days per week: Not on file    Minutes per session: Not on file  . Stress: Not on file  Relationships  . Social connections:    Talks on phone: Not on file    Gets together: Not on file    Attends religious service: Not on file    Active member of club or organization: Not on file    Attends meetings of clubs or organizations: Not on file    Relationship status: Not on file  Other Topics Concern  . Not on file  Social History Narrative  . Not  on file     Family History: The patient's family history includes Heart attack in his mother; Heart failure in his father; Stroke in his mother. There is no history of Hypertension.  ROS:   Please see the history of present illness.     All other systems reviewed and are negative.  EKGs/Labs/Other Studies Reviewed:    The following studies were reviewed today:   EKG:    Feb. 6, 2020 :   NSR .  No St or T wave changes.   Recent Labs: 11/18/2018: BUN 21; Creatinine, Ser 1.32; Hemoglobin 15.5; Platelets 253; Potassium 4.0; Sodium 137  Recent Lipid Panel No results found for: CHOL, TRIG, HDL, CHOLHDL, VLDL, LDLCALC, LDLDIRECT  Physical Exam:    VS:  BP 128/80   Pulse 75   Ht 5\' 6"  (1.676 m)   Wt 230 lb 1.9 oz (104.4 kg)   SpO2 96%   BMI 37.14 kg/m     Wt Readings from Last 3 Encounters:  11/22/18 230 lb 1.9 oz (104.4 kg)  11/18/18 228 lb 3 oz (103.5 kg)  08/07/15 233 lb (105.7 kg)     GEN:  Well nourished, well developed in no acute distress HEENT: Normal NECK: No JVD; No carotid bruits LYMPHATICS: No lymphadenopathy CARDIAC: RRR,  Soft systlic murmur .  RESPIRATORY:  Clear to auscultation without rales, wheezing or rhonchi  ABDOMEN: Soft, non-tender, non-distended MUSCULOSKELETAL:  No edema; No deformity  SKIN: Warm and dry NEUROLOGIC:  Alert and oriented x 3 PSYCHIATRIC:  Normal affect   ASSESSMENT:    1. Essential hypertension    PLAN:    In order of problems listed above:  1.  HTN:    BP has been well controlled.   He does not have any cardiac issues.  He is at low risk for vascular complications with his upcoming left total knee replacement.  Blood pressure is very well controlled at this point.  He will continue to follow-up with his primary medical doctor.  I will see him on an as-needed basis.   Medication Adjustments/Labs and Tests Ordered: Current medicines are reviewed at length with the patient today.  Concerns regarding medicines are outlined  above.  No orders of the defined types were placed in this encounter.  No  orders of the defined types were placed in this encounter.   Patient Instructions  Medication Instructions:  Your physician recommends that you continue on your current medications as directed. Please refer to the Current Medication list given to you today.  If you need a refill on your cardiac medications before your next appointment, please call your pharmacy.    Lab work: None Ordered    Testing/Procedures: None Ordered    Follow-Up: At [facility], you and your health needs are our priority.  As part of our continuing mission to provide you with exceptional heart care, we have created designated Provider Care Teams.  These Care Teams include your primary Cardiologist (physician) and Advanced Practice Providers (APPs -  Physician Assistants and Nurse Practitioners) who all work together to provide you with the care you need, when you need it. You will need a follow up appointment in:   as needed.  You may see Dr. Acie Fredrickson or one of the following Advanced Practice Providers on your designated Care Team: Richardson Dopp, PA-C [place], [place] . Daune Perch, NP      Signed, Mertie Moores, MD  11/22/2018 5:23 PM    [place]

## 2018-11-22 NOTE — Anesthesia Preprocedure Evaluation (Addendum)
Anesthesia Evaluation  Patient identified by MRN, date of birth, ID band Patient awake    Reviewed: Allergy & Precautions, H&P , NPO status , Patient's Chart, lab work & pertinent test results  Airway Mallampati: II   Neck ROM: full    Dental   Pulmonary former smoker,    breath sounds clear to auscultation       Cardiovascular hypertension,  Rhythm:regular Rate:Normal     Neuro/Psych  Headaches,    GI/Hepatic GERD  ,  Endo/Other  obese  Renal/GU Renal InsufficiencyRenal disease     Musculoskeletal  (+) Arthritis ,   Abdominal   Peds  Hematology   Anesthesia Other Findings   Reproductive/Obstetrics                            Anesthesia Physical Anesthesia Plan  ASA: III  Anesthesia Plan: Spinal   Post-op Pain Management:  Regional for Post-op pain   Induction: Intravenous  PONV Risk Score and Plan: 1 and Propofol infusion, Ondansetron and Treatment may vary due to age or medical condition  Airway Management Planned: Simple Face Mask  Additional Equipment:   Intra-op Plan:   Post-operative Plan:   Informed Consent: I have reviewed the patients History and Physical, chart, labs and discussed the procedure including the risks, benefits and alternatives for the proposed anesthesia with the patient or authorized representative who has indicated his/her understanding and acceptance.       Plan Discussed with: CRNA, Anesthesiologist and Surgeon  Anesthesia Plan Comments: (See PST note 11/18/18, Konrad Felix, PA-C)       Anesthesia Quick Evaluation

## 2018-11-22 NOTE — Telephone Encounter (Signed)
Logan Wright is at low risk for his upcoming left total knee replacement     Mertie Moores, MD  11/22/2018 3:28 PM    [address],  [place], [place]  [id number] Pager [phone] Phone: [phone]; Fax: [phone]

## 2018-11-23 ENCOUNTER — Inpatient Hospital Stay (HOSPITAL_COMMUNITY): Payer: PPO | Admitting: Certified Registered Nurse Anesthetist

## 2018-11-23 ENCOUNTER — Encounter (HOSPITAL_COMMUNITY): Payer: Self-pay | Admitting: Emergency Medicine

## 2018-11-23 ENCOUNTER — Other Ambulatory Visit: Payer: Self-pay

## 2018-11-23 ENCOUNTER — Inpatient Hospital Stay (HOSPITAL_COMMUNITY): Payer: PPO | Admitting: Physician Assistant

## 2018-11-23 ENCOUNTER — Encounter (HOSPITAL_COMMUNITY)
Admission: RE | Disposition: A | Discharge status: Home / Self Care | Payer: Self-pay | Source: Home / Self Care | Attending: Orthopedic Surgery

## 2018-11-23 ENCOUNTER — Inpatient Hospital Stay (HOSPITAL_COMMUNITY)
Admission: RE | Admit: 2018-11-23 | Discharge: 2018-11-24 | DRG: 470 | Disposition: A | Discharge status: Home / Self Care | Payer: PPO | Attending: Orthopedic Surgery | Admitting: Orthopedic Surgery

## 2018-11-23 DIAGNOSIS — Z96652 Presence of left artificial knee joint: Secondary | ICD-10-CM

## 2018-11-23 DIAGNOSIS — Z7982 Long term (current) use of aspirin: Secondary | ICD-10-CM

## 2018-11-23 DIAGNOSIS — N183 Chronic kidney disease, stage 3 (moderate): Secondary | ICD-10-CM | POA: Diagnosis not present

## 2018-11-23 DIAGNOSIS — M1712 Unilateral primary osteoarthritis, left knee: Secondary | ICD-10-CM | POA: Diagnosis not present

## 2018-11-23 DIAGNOSIS — M25462 Effusion, left knee: Secondary | ICD-10-CM | POA: Diagnosis present

## 2018-11-23 DIAGNOSIS — Z79899 Other long term (current) drug therapy: Secondary | ICD-10-CM

## 2018-11-23 DIAGNOSIS — M659 Synovitis and tenosynovitis, unspecified: Secondary | ICD-10-CM | POA: Diagnosis present

## 2018-11-23 DIAGNOSIS — E7800 Pure hypercholesterolemia, unspecified: Secondary | ICD-10-CM | POA: Diagnosis present

## 2018-11-23 DIAGNOSIS — I129 Hypertensive chronic kidney disease with stage 1 through stage 4 chronic kidney disease, or unspecified chronic kidney disease: Secondary | ICD-10-CM | POA: Diagnosis not present

## 2018-11-23 DIAGNOSIS — G8918 Other acute postprocedural pain: Secondary | ICD-10-CM | POA: Diagnosis not present

## 2018-11-23 DIAGNOSIS — Z8249 Family history of ischemic heart disease and other diseases of the circulatory system: Secondary | ICD-10-CM | POA: Diagnosis not present

## 2018-11-23 DIAGNOSIS — I10 Essential (primary) hypertension: Secondary | ICD-10-CM | POA: Diagnosis not present

## 2018-11-23 DIAGNOSIS — Z87891 Personal history of nicotine dependence: Secondary | ICD-10-CM

## 2018-11-23 DIAGNOSIS — E1122 Type 2 diabetes mellitus with diabetic chronic kidney disease: Secondary | ICD-10-CM | POA: Diagnosis present

## 2018-11-23 DIAGNOSIS — M25762 Osteophyte, left knee: Secondary | ICD-10-CM | POA: Diagnosis present

## 2018-11-23 DIAGNOSIS — E785 Hyperlipidemia, unspecified: Secondary | ICD-10-CM | POA: Diagnosis not present

## 2018-11-23 HISTORY — PX: TOTAL KNEE ARTHROPLASTY: SHX125

## 2018-11-23 LAB — TYPE AND SCREEN
ABO/RH(D): B POS
Antibody Screen: NEGATIVE

## 2018-11-23 SURGERY — ARTHROPLASTY, KNEE, TOTAL
Anesthesia: Spinal | Site: Knee | Laterality: Left

## 2018-11-23 MED ORDER — TRANEXAMIC ACID-NACL 1000-0.7 MG/100ML-% IV SOLN
1000.0000 mg | Freq: Once | INTRAVENOUS | Status: AC
  Administered 2018-11-23: 1000 mg via INTRAVENOUS
  Filled 2018-11-23: qty 100

## 2018-11-23 MED ORDER — PROPOFOL 500 MG/50ML IV EMUL
INTRAVENOUS | Status: DC | PRN
  Administered 2018-11-23 (×2): 25 ug/kg/min via INTRAVENOUS

## 2018-11-23 MED ORDER — DOCUSATE SODIUM 100 MG PO CAPS
100.0000 mg | ORAL_CAPSULE | Freq: Two times a day (BID) | ORAL | Status: DC
  Administered 2018-11-23 – 2018-11-24 (×2): 100 mg via ORAL
  Filled 2018-11-23 (×2): qty 1

## 2018-11-23 MED ORDER — POLYETHYLENE GLYCOL 3350 17 G PO PACK: 17.0000 g | PACK | Freq: Two times a day (BID) | ORAL | 0 refills | Status: DC

## 2018-11-23 MED ORDER — OXYCODONE HCL 5 MG PO TABS: 5.0000 mg | ORAL_TABLET | Freq: Once | ORAL | Status: DC | PRN

## 2018-11-23 MED ORDER — KETOROLAC TROMETHAMINE 30 MG/ML IJ SOLN
INTRAMUSCULAR | Status: DC | PRN
  Administered 2018-11-23: 30 mg

## 2018-11-23 MED ORDER — TRANEXAMIC ACID-NACL 1000-0.7 MG/100ML-% IV SOLN
1000.0000 mg | INTRAVENOUS | Status: AC
  Administered 2018-11-23: 1000 mg via INTRAVENOUS
  Filled 2018-11-23: qty 100

## 2018-11-23 MED ORDER — DOCUSATE SODIUM 100 MG PO CAPS: 100.0000 mg | ORAL_CAPSULE | Freq: Two times a day (BID) | ORAL | 0 refills | Status: DC

## 2018-11-23 MED ORDER — SODIUM CHLORIDE 0.9 % IV SOLN
INTRAVENOUS | Status: DC
  Administered 2018-11-23 – 2018-11-24 (×2): via INTRAVENOUS

## 2018-11-23 MED ORDER — CEFAZOLIN SODIUM-DEXTROSE 2-4 GM/100ML-% IV SOLN
2.0000 g | INTRAVENOUS | Status: AC
  Administered 2018-11-23: 2 g via INTRAVENOUS
  Filled 2018-11-23: qty 100

## 2018-11-23 MED ORDER — SODIUM CHLORIDE (PF) 0.9 % IJ SOLN
INTRAMUSCULAR | Status: AC
  Filled 2018-11-23: qty 50

## 2018-11-23 MED ORDER — STERILE WATER FOR IRRIGATION IR SOLN
Status: DC | PRN
  Administered 2018-11-23: 2000 mL

## 2018-11-23 MED ORDER — LOSARTAN POTASSIUM-HCTZ 50-12.5 MG PO TABS: 1.0000 | ORAL_TABLET | Freq: Every day | ORAL | Status: DC

## 2018-11-23 MED ORDER — HYDROCODONE-ACETAMINOPHEN 5-325 MG PO TABS
1.0000 | ORAL_TABLET | ORAL | Status: DC | PRN
  Administered 2018-11-23 – 2018-11-24 (×3): 1 via ORAL
  Filled 2018-11-23 (×3): qty 1

## 2018-11-23 MED ORDER — CELECOXIB 200 MG PO CAPS
200.0000 mg | ORAL_CAPSULE | Freq: Two times a day (BID) | ORAL | Status: DC
  Administered 2018-11-23 – 2018-11-24 (×2): 200 mg via ORAL
  Filled 2018-11-23 (×2): qty 1

## 2018-11-23 MED ORDER — PROPOFOL 10 MG/ML IV BOLUS
INTRAVENOUS | Status: DC | PRN
  Administered 2018-11-23: 20 mg via INTRAVENOUS

## 2018-11-23 MED ORDER — LACTATED RINGERS IV SOLN
INTRAVENOUS | Status: DC
  Administered 2018-11-23 (×3): via INTRAVENOUS

## 2018-11-23 MED ORDER — ASPIRIN 81 MG PO CHEW: 81.0000 mg | CHEWABLE_TABLET | Freq: Two times a day (BID) | ORAL | 0 refills | Status: DC

## 2018-11-23 MED ORDER — METOCLOPRAMIDE HCL 5 MG/ML IJ SOLN: 5.0000 mg | Freq: Three times a day (TID) | INTRAMUSCULAR | Status: DC | PRN

## 2018-11-23 MED ORDER — FENTANYL CITRATE (PF) 100 MCG/2ML IJ SOLN: 25.0000 ug | INTRAMUSCULAR | Status: DC | PRN

## 2018-11-23 MED ORDER — PROPOFOL 10 MG/ML IV BOLUS
INTRAVENOUS | Status: AC
  Filled 2018-11-23: qty 60

## 2018-11-23 MED ORDER — FENTANYL CITRATE (PF) 100 MCG/2ML IJ SOLN
INTRAMUSCULAR | Status: AC
  Filled 2018-11-23: qty 2

## 2018-11-23 MED ORDER — DIPHENHYDRAMINE HCL 12.5 MG/5ML PO ELIX: 12.5000 mg | ORAL_SOLUTION | ORAL | Status: DC | PRN

## 2018-11-23 MED ORDER — CEFAZOLIN SODIUM-DEXTROSE 2-4 GM/100ML-% IV SOLN
2.0000 g | Freq: Four times a day (QID) | INTRAVENOUS | Status: AC
  Administered 2018-11-23 (×2): 2 g via INTRAVENOUS
  Filled 2018-11-23 (×2): qty 100

## 2018-11-23 MED ORDER — 0.9 % SODIUM CHLORIDE (POUR BTL) OPTIME
TOPICAL | Status: DC | PRN
  Administered 2018-11-23: 1000 mL

## 2018-11-23 MED ORDER — MIDAZOLAM HCL 5 MG/5ML IJ SOLN
INTRAMUSCULAR | Status: DC | PRN
  Administered 2018-11-23: 1 mg via INTRAVENOUS
  Administered 2018-11-23: 2 mg via INTRAVENOUS

## 2018-11-23 MED ORDER — ALLOPURINOL 100 MG PO TABS
200.0000 mg | ORAL_TABLET | Freq: Every day | ORAL | Status: DC
  Filled 2018-11-23: qty 2

## 2018-11-23 MED ORDER — ONDANSETRON HCL 4 MG PO TABS: 4.0000 mg | ORAL_TABLET | Freq: Four times a day (QID) | ORAL | Status: DC | PRN

## 2018-11-23 MED ORDER — ACETAMINOPHEN 325 MG PO TABS: 325.0000 mg | ORAL_TABLET | Freq: Four times a day (QID) | ORAL | Status: DC | PRN

## 2018-11-23 MED ORDER — SODIUM CHLORIDE 0.9 % IR SOLN
Status: DC | PRN
  Administered 2018-11-23: 1000 mL

## 2018-11-23 MED ORDER — METOCLOPRAMIDE HCL 5 MG PO TABS: 5.0000 mg | ORAL_TABLET | Freq: Three times a day (TID) | ORAL | Status: DC | PRN

## 2018-11-23 MED ORDER — DEXAMETHASONE SODIUM PHOSPHATE 10 MG/ML IJ SOLN
INTRAMUSCULAR | Status: AC
  Filled 2018-11-23: qty 1

## 2018-11-23 MED ORDER — BUPIVACAINE HCL (PF) 0.25 % IJ SOLN
INTRAMUSCULAR | Status: AC
  Filled 2018-11-23: qty 30

## 2018-11-23 MED ORDER — SODIUM CHLORIDE (PF) 0.9 % IJ SOLN
INTRAMUSCULAR | Status: DC | PRN
  Administered 2018-11-23: 30 mL

## 2018-11-23 MED ORDER — ASPIRIN 81 MG PO CHEW
81.0000 mg | CHEWABLE_TABLET | Freq: Two times a day (BID) | ORAL | Status: DC
  Administered 2018-11-23 – 2018-11-24 (×2): 81 mg via ORAL
  Filled 2018-11-23 (×2): qty 1

## 2018-11-23 MED ORDER — DEXAMETHASONE SODIUM PHOSPHATE 10 MG/ML IJ SOLN
10.0000 mg | Freq: Once | INTRAMUSCULAR | Status: AC
  Administered 2018-11-23: 10 mg via INTRAVENOUS

## 2018-11-23 MED ORDER — METHOCARBAMOL 500 MG PO TABS
500.0000 mg | ORAL_TABLET | Freq: Four times a day (QID) | ORAL | Status: DC | PRN
  Filled 2018-11-23: qty 1

## 2018-11-23 MED ORDER — ATORVASTATIN CALCIUM 40 MG PO TABS
40.0000 mg | ORAL_TABLET | Freq: Every day | ORAL | Status: DC
  Administered 2018-11-24: 40 mg via ORAL
  Filled 2018-11-23: qty 1

## 2018-11-23 MED ORDER — MIDAZOLAM HCL 2 MG/2ML IJ SOLN
INTRAMUSCULAR | Status: AC
  Filled 2018-11-23: qty 2

## 2018-11-23 MED ORDER — METHOCARBAMOL 500 MG IVPB - SIMPLE MED
500.0000 mg | Freq: Four times a day (QID) | INTRAVENOUS | Status: DC | PRN
  Filled 2018-11-23: qty 50

## 2018-11-23 MED ORDER — ROPIVACAINE HCL 5 MG/ML IJ SOLN
INTRAMUSCULAR | Status: DC | PRN
  Administered 2018-11-23: 20 mL via PERINEURAL

## 2018-11-23 MED ORDER — HYDROCODONE-ACETAMINOPHEN 7.5-325 MG PO TABS: 1.0000 | ORAL_TABLET | ORAL | 0 refills | Status: DC | PRN

## 2018-11-23 MED ORDER — ALUM & MAG HYDROXIDE-SIMETH 200-200-20 MG/5ML PO SUSP: 15.0000 mL | ORAL | Status: DC | PRN

## 2018-11-23 MED ORDER — POLYETHYLENE GLYCOL 3350 17 G PO PACK
17.0000 g | PACK | Freq: Two times a day (BID) | ORAL | Status: DC
  Administered 2018-11-23 – 2018-11-24 (×2): 17 g via ORAL
  Filled 2018-11-23 (×2): qty 1

## 2018-11-23 MED ORDER — DEXAMETHASONE SODIUM PHOSPHATE 10 MG/ML IJ SOLN
10.0000 mg | Freq: Once | INTRAMUSCULAR | Status: AC
  Administered 2018-11-24: 10 mg via INTRAVENOUS
  Filled 2018-11-23: qty 1

## 2018-11-23 MED ORDER — LOSARTAN POTASSIUM 50 MG PO TABS
50.0000 mg | ORAL_TABLET | Freq: Every day | ORAL | Status: DC
  Administered 2018-11-24: 50 mg via ORAL
  Filled 2018-11-23: qty 1

## 2018-11-23 MED ORDER — BISACODYL 10 MG RE SUPP: 10.0000 mg | Freq: Every day | RECTAL | Status: DC | PRN

## 2018-11-23 MED ORDER — PHENOL 1.4 % MT LIQD: 1.0000 | OROMUCOSAL | Status: DC | PRN

## 2018-11-23 MED ORDER — METHOCARBAMOL 500 MG PO TABS: 500.0000 mg | ORAL_TABLET | Freq: Four times a day (QID) | ORAL | 0 refills | Status: DC | PRN

## 2018-11-23 MED ORDER — ONDANSETRON HCL 4 MG/2ML IJ SOLN: 4.0000 mg | Freq: Four times a day (QID) | INTRAMUSCULAR | Status: DC | PRN

## 2018-11-23 MED ORDER — HYDROCHLOROTHIAZIDE 12.5 MG PO CAPS
12.5000 mg | ORAL_CAPSULE | Freq: Every day | ORAL | Status: DC
  Administered 2018-11-24: 12.5 mg via ORAL
  Filled 2018-11-23: qty 1

## 2018-11-23 MED ORDER — BUPIVACAINE IN DEXTROSE 0.75-8.25 % IT SOLN
INTRATHECAL | Status: DC | PRN
  Administered 2018-11-23: 1.8 mL via INTRATHECAL

## 2018-11-23 MED ORDER — FERROUS SULFATE 325 (65 FE) MG PO TABS
325.0000 mg | ORAL_TABLET | Freq: Two times a day (BID) | ORAL | Status: DC
  Administered 2018-11-23 – 2018-11-24 (×2): 325 mg via ORAL
  Filled 2018-11-23 (×2): qty 1

## 2018-11-23 MED ORDER — PROPOFOL 10 MG/ML IV BOLUS
INTRAVENOUS | Status: AC
  Filled 2018-11-23: qty 20

## 2018-11-23 MED ORDER — MENTHOL 3 MG MT LOZG: 1.0000 | LOZENGE | OROMUCOSAL | Status: DC | PRN

## 2018-11-23 MED ORDER — FERROUS SULFATE 325 (65 FE) MG PO TABS: 325.0000 mg | ORAL_TABLET | Freq: Three times a day (TID) | ORAL | 3 refills | Status: DC

## 2018-11-23 MED ORDER — OXYCODONE HCL 5 MG/5ML PO SOLN
5.0000 mg | Freq: Once | ORAL | Status: DC | PRN
  Filled 2018-11-23: qty 5

## 2018-11-23 MED ORDER — CHLORHEXIDINE GLUCONATE 4 % EX LIQD: 60.0000 mL | Freq: Once | CUTANEOUS | Status: DC

## 2018-11-23 MED ORDER — MAGNESIUM CITRATE PO SOLN: 1.0000 | Freq: Once | ORAL | Status: DC | PRN

## 2018-11-23 MED ORDER — BUPIVACAINE HCL (PF) 0.25 % IJ SOLN
INTRAMUSCULAR | Status: DC | PRN
  Administered 2018-11-23: 30 mL

## 2018-11-23 MED ORDER — AMLODIPINE BESYLATE 5 MG PO TABS
2.5000 mg | ORAL_TABLET | Freq: Every day | ORAL | Status: DC
  Administered 2018-11-24: 2.5 mg via ORAL
  Filled 2018-11-23: qty 1

## 2018-11-23 MED ORDER — KETOROLAC TROMETHAMINE 30 MG/ML IJ SOLN
INTRAMUSCULAR | Status: AC
  Filled 2018-11-23: qty 1

## 2018-11-23 MED ORDER — FENTANYL CITRATE (PF) 100 MCG/2ML IJ SOLN
INTRAMUSCULAR | Status: DC | PRN
  Administered 2018-11-23 (×2): 50 ug via INTRAVENOUS
  Administered 2018-11-23: 100 ug via INTRAVENOUS

## 2018-11-23 MED ORDER — HYDROMORPHONE HCL 1 MG/ML IJ SOLN: 0.5000 mg | INTRAMUSCULAR | Status: DC | PRN

## 2018-11-23 MED ORDER — HYDROCODONE-ACETAMINOPHEN 7.5-325 MG PO TABS: 1.0000 | ORAL_TABLET | ORAL | Status: DC | PRN

## 2018-11-23 SURGICAL SUPPLY — 65 items
ADH SKN CLS APL DERMABOND .7 (GAUZE/BANDAGES/DRESSINGS) ×1
ATTUNE MED ANAT PAT 38 KNEE (Knees) ×1 IMPLANT
ATTUNE PS FEM LT SZ 6 CEM KNEE (Femur) ×2 IMPLANT
ATTUNE PSRP INSR SZ6 6 KNEE (Insert) ×2 IMPLANT
BAG SPEC THK2 15X12 ZIP CLS (MISCELLANEOUS)
BAG ZIPLOCK 12X15 (MISCELLANEOUS) IMPLANT
BANDAGE ACE 6X5 VEL STRL LF (GAUZE/BANDAGES/DRESSINGS) ×2 IMPLANT
BASE TIBIA ATTUNE KNEE SYS SZ6 (Knees) ×1 IMPLANT
BLADE SAW SGTL 11.0X1.19X90.0M (BLADE) IMPLANT
BLADE SAW SGTL 13.0X1.19X90.0M (BLADE) ×2 IMPLANT
BLADE SURG SZ10 CARB STEEL (BLADE) ×4 IMPLANT
BOWL SMART MIX CTS (DISPOSABLE) ×2 IMPLANT
BSPLAT TIB 6 CMNT ROT PLAT STR (Knees) ×1 IMPLANT
CEMENT HV SMART SET (Cement) ×4 IMPLANT
COVER SURGICAL LIGHT HANDLE (MISCELLANEOUS) ×2 IMPLANT
COVER WAND RF STERILE (DRAPES) ×2 IMPLANT
CUFF TOURN SGL QUICK 34 (TOURNIQUET CUFF) ×2
CUFF TRNQT CYL 34X4X40X1 (TOURNIQUET CUFF) ×1 IMPLANT
DECANTER SPIKE VIAL GLASS SM (MISCELLANEOUS) ×4 IMPLANT
DERMABOND ADVANCED (GAUZE/BANDAGES/DRESSINGS) ×1
DERMABOND ADVANCED .7 DNX12 (GAUZE/BANDAGES/DRESSINGS) ×1 IMPLANT
DRAPE U-SHAPE 47X51 STRL (DRAPES) ×2 IMPLANT
DRESSING AQUACEL AG SP 3.5X10 (GAUZE/BANDAGES/DRESSINGS) ×1 IMPLANT
DRSG AQUACEL AG SP 3.5X10 (GAUZE/BANDAGES/DRESSINGS) ×2
DURAPREP 26ML APPLICATOR (WOUND CARE) ×4 IMPLANT
ELECT REM PT RETURN 15FT ADLT (MISCELLANEOUS) ×2 IMPLANT
GLOVE BIO SURGEON STRL SZ 6.5 (GLOVE) ×8 IMPLANT
GLOVE BIO SURGEON STRL SZ7 (GLOVE) ×1 IMPLANT
GLOVE BIOGEL PI IND STRL 6.5 (GLOVE) ×1 IMPLANT
GLOVE BIOGEL PI IND STRL 7.0 (GLOVE) IMPLANT
GLOVE BIOGEL PI IND STRL 7.5 (GLOVE) ×3 IMPLANT
GLOVE BIOGEL PI IND STRL 8 (GLOVE) ×1 IMPLANT
GLOVE BIOGEL PI IND STRL 8.5 (GLOVE) IMPLANT
GLOVE BIOGEL PI INDICATOR 6.5 (GLOVE) ×1
GLOVE BIOGEL PI INDICATOR 7.0 (GLOVE) ×1
GLOVE BIOGEL PI INDICATOR 7.5 (GLOVE) ×3
GLOVE BIOGEL PI INDICATOR 8 (GLOVE) ×1
GLOVE BIOGEL PI INDICATOR 8.5 (GLOVE)
GLOVE ECLIPSE 8.0 STRL XLNG CF (GLOVE) IMPLANT
GLOVE ORTHO TXT STRL SZ7.5 (GLOVE) ×2 IMPLANT
GOWN SPEC L4 XLG W/TWL (GOWN DISPOSABLE) ×2 IMPLANT
GOWN STRL REUS W/TWL 2XL LVL3 (GOWN DISPOSABLE) IMPLANT
GOWN STRL REUS W/TWL LRG LVL3 (GOWN DISPOSABLE) ×6 IMPLANT
HANDPIECE INTERPULSE COAX TIP (DISPOSABLE) ×2
HOLDER FOLEY CATH W/STRAP (MISCELLANEOUS) ×2 IMPLANT
MANIFOLD NEPTUNE II (INSTRUMENTS) ×2 IMPLANT
NDL SAFETY ECLIPSE 18X1.5 (NEEDLE) ×1 IMPLANT
NEEDLE HYPO 18GX1.5 SHARP (NEEDLE) ×2
NS IRRIG 1000ML POUR BTL (IV SOLUTION) ×2 IMPLANT
PACK TOTAL KNEE CUSTOM (KITS) ×2 IMPLANT
PIN FIX SIGMA HP QUICK REL (PIN) ×2 IMPLANT
PROTECTOR NERVE ULNAR (MISCELLANEOUS) ×2 IMPLANT
SET HNDPC FAN SPRY TIP SCT (DISPOSABLE) ×1 IMPLANT
SET PAD KNEE POSITIONER (MISCELLANEOUS) ×2 IMPLANT
SUT MNCRL AB 4-0 PS2 18 (SUTURE) ×2 IMPLANT
SUT STRATAFIX PDS+ 0 24IN (SUTURE) ×2 IMPLANT
SUT VIC AB 1 CT1 36 (SUTURE) ×2 IMPLANT
SUT VIC AB 2-0 CT1 27 (SUTURE) ×6
SUT VIC AB 2-0 CT1 TAPERPNT 27 (SUTURE) ×3 IMPLANT
SYR 3ML LL SCALE MARK (SYRINGE) ×2 IMPLANT
TIBIA ATTUNE KNEE SYS BASE SZ6 (Knees) ×2 IMPLANT
TRAY FOLEY MTR SLVR 16FR STAT (SET/KITS/TRAYS/PACK) ×2 IMPLANT
WATER STERILE IRR 1000ML POUR (IV SOLUTION) ×4 IMPLANT
WRAP KNEE MAXI GEL POST OP (GAUZE/BANDAGES/DRESSINGS) ×2 IMPLANT
YANKAUER SUCT BULB TIP 10FT TU (MISCELLANEOUS) ×2 IMPLANT

## 2018-11-23 NOTE — Transfer of Care (Signed)
Immediate Anesthesia Transfer of Care Note  Patient: Logan Wright  Procedure(s) Performed: TOTAL KNEE ARTHROPLASTY (Left Knee)  Patient Location: PACU  Anesthesia Type:Spinal  Level of Consciousness: awake, alert , oriented and patient cooperative  Airway & Oxygen Therapy: Patient Spontanous Breathing and Patient connected to face mask oxygen  Post-op Assessment: Report given to RN and Post -op Vital signs reviewed and stable  Post vital signs: stable  Last Vitals:  Vitals Value Taken Time  BP 126/80 11/23/2018  9:23 AM  Temp 36.4 C 11/23/2018  9:23 AM  Pulse 58 11/23/2018  9:29 AM  Resp 18 11/23/2018  9:29 AM  SpO2 98 % 11/23/2018  9:29 AM  Vitals shown include unvalidated device data.  Last Pain:  Vitals:   11/23/18 0539  TempSrc:   PainSc: 0-No pain      Patients Stated Pain Goal: 4 (44/45/84 8350)  Complications: No apparent anesthesia complications

## 2018-11-23 NOTE — Anesthesia Procedure Notes (Signed)
Procedure Name: MAC Date/Time: 11/23/2018 7:18 AM Performed by: Lissa Morales, CRNA Pre-anesthesia Checklist: Patient identified, Emergency Drugs available, Suction available, Patient being monitored and Timeout performed Patient Re-evaluated:Patient Re-evaluated prior to induction Oxygen Delivery Method: Simple face mask Placement Confirmation: positive ETCO2

## 2018-11-23 NOTE — Interval H&P Note (Signed)
History and Physical Interval Note:  11/23/2018 6:57 AM  Logan Wright  has presented today for surgery, with the diagnosis of Left knee osteoarthritis  The various methods of treatment have been discussed with the patient and family. After consideration of risks, benefits and other options for treatment, the patient has consented to  Procedure(s) with comments: TOTAL KNEE ARTHROPLASTY (Left) - 70 mins as a surgical intervention .  The patient's history has been reviewed, patient examined, no change in status, stable for surgery.  I have reviewed the patient's chart and labs.  Questions were answered to the patient's satisfaction.     Mauri Pole

## 2018-11-23 NOTE — Anesthesia Postprocedure Evaluation (Signed)
Anesthesia Post Note  Patient: Logan Wright  Procedure(s) Performed: TOTAL KNEE ARTHROPLASTY (Left Knee)     Patient location during evaluation: PACU Anesthesia Type: Spinal Level of consciousness: oriented and awake and alert Pain management: pain level controlled Vital Signs Assessment: post-procedure vital signs reviewed and stable Respiratory status: spontaneous breathing, respiratory function stable and patient connected to nasal cannula oxygen Cardiovascular status: blood pressure returned to baseline and stable Postop Assessment: no headache, no backache and no apparent nausea or vomiting Anesthetic complications: no    Last Vitals:  Vitals:   11/23/18 1046 11/23/18 1147  BP: 136/77 135/81  Pulse: 62 61  Resp:  18  Temp: 36.7 C 36.7 C  SpO2: 95% 95%    Last Pain:  Vitals:   11/23/18 1147  TempSrc: Oral  PainSc:                  [name]

## 2018-11-23 NOTE — Evaluation (Signed)
Physical Therapy Evaluation Patient Details Name: Logan Wright MRN: [id number] DOB: 1945/12/23 Today's Date: 11/23/2018   History of Present Illness  73 yo male s/p L TKR on 11/23/18. PMH includes GERD, diverticulosis, vertigo, colon adenoma, OA, HTN, HLD.  Clinical Impression  Pt presents with mild L knee pain, decreased L knee ROM, difficulty performing bed mobility, and increased time and effort to perform mobility tasks. Pt to benefit from acute PT to address deficits. Pt ambulated hallway distance with RW with min guard assist, verbal cuing provided throughout for form and safety. Pt highly motivated, and works out multiple times a week. Pt states he wants to do everything he can to help his knee heal well. PT to progress mobility as tolerated, and will continue to follow acutely.        Follow Up Recommendations Follow surgeon's recommendation for DC plan and follow-up therapies;Supervision for mobility/OOB(OPPT)    Equipment Recommendations  Rolling walker with 5" wheels;3in1 (PT)    Recommendations for Other Services       Precautions / Restrictions Precautions Precautions: Fall Restrictions Weight Bearing Restrictions: No LLE Weight Bearing: Weight bearing as tolerated      Mobility  Bed Mobility Overal bed mobility: Needs Assistance Bed Mobility: Supine to Sit     Supine to sit: Min assist;HOB elevated     General bed mobility comments: Min assist for LLE lifting and translation to EOB, increased time to scoot to EOB.   Transfers Overall transfer level: Needs assistance Equipment used: Rolling walker (2 wheeled) Transfers: Sit to/from Stand Sit to Stand: Min assist;From elevated surface         General transfer comment: Min assist for power up, steadying upon standing. Verbal cuing for hand placement when rising, foot placement within RW upon standing.   Ambulation/Gait Ambulation/Gait assistance: Min guard Gait Distance (Feet): 60 Feet Assistive  device: Rolling walker (2 wheeled) Gait Pattern/deviations: Step-to pattern;Decreased stance time - left;Decreased weight shift to left;Antalgic;Trunk flexed Gait velocity: decr    General Gait Details: Min guard for safety, close guarding for L knee buckling but no L knee instability present. Verbal cuing for sequencing, placement in RW, turning. Pt noted slight dizziness once sitting in recliner after ambulation, but passed quickly.   Stairs            Wheelchair Mobility    Modified Rankin (Stroke Patients Only)       Balance Overall balance assessment: Mild deficits observed, not formally tested                                           Pertinent Vitals/Pain Pain Assessment: 0-10 Pain Score: 2  Pain Location: L knee  Pain Descriptors / Indicators: Sore Pain Intervention(s): Limited activity within patient's tolerance;Repositioned;Ice applied;Monitored during session;Premedicated before session    Home Living Family/patient expects to be discharged to:: Private residence Living Arrangements: Spouse/significant other Available Help at Discharge: Family;Friend(s);Available 24 hours/day Type of Home: House Home Access: Stairs to enter Entrance Stairs-Rails: None Entrance Stairs-Number of Steps: 3 Home Layout: Multi-level;Able to live on main level with bedroom/bathroom Home Equipment: Kasandra Knudsen - single point      Prior Function Level of Independence: Independent               Hand Dominance   Dominant Hand: Right    Extremity/Trunk Assessment   Upper Extremity Assessment Upper Extremity Assessment: Overall  WFL for tasks assessed    Lower Extremity Assessment Lower Extremity Assessment: Overall WFL for tasks assessed;LLE deficits/detail LLE Deficits / Details: suspected post-surgical weakness; able to perform SLR with min lift assist, quad set, heel slide, and ankle pumps  LLE Sensation: WNL    Cervical / Trunk Assessment Cervical /  Trunk Assessment: Normal  Communication   Communication: No difficulties  Cognition Arousal/Alertness: Awake/alert Behavior During Therapy: WFL for tasks assessed/performed Overall Cognitive Status: Within Functional Limits for tasks assessed                                        General Comments      Exercises Total Joint Exercises Goniometric ROM: knee arom ~10-75*, limited by pain and stiffness   Assessment/Plan    PT Assessment Patient needs continued PT services  PT Problem List Decreased strength;Pain;Decreased range of motion;Decreased activity tolerance;Decreased knowledge of use of DME;Decreased balance;Decreased mobility       PT Treatment Interventions DME instruction;Therapeutic activities;Gait training;Therapeutic exercise;Patient/family education;Stair training;Balance training;Functional mobility training    PT Goals (Current goals can be found in the Care Plan section)  Acute Rehab PT Goals Patient Stated Goal: do the right things for my knee to heal properly PT Goal Formulation: With patient Time For Goal Achievement: 11/30/18 Potential to Achieve Goals: Good    Frequency 7X/week   Barriers to discharge        Co-evaluation               AM-PAC PT "6 Clicks" Mobility  Outcome Measure Help needed turning from your back to your side while in a flat bed without using bedrails?: A Little Help needed moving from lying on your back to sitting on the side of a flat bed without using bedrails?: A Little Help needed moving to and from a bed to a chair (including a wheelchair)?: A Little Help needed standing up from a chair using your arms (e.g., wheelchair or bedside chair)?: A Little Help needed to walk in hospital room?: A Little Help needed climbing 3-5 steps with a railing? : A Little 6 Click Score: 18    End of Session Equipment Utilized During Treatment: Gait belt Activity Tolerance: Patient tolerated treatment well Patient  left: in chair;with chair alarm set;with call bell/phone within reach;with SCD's reapplied Nurse Communication: Mobility status PT Visit Diagnosis: Other abnormalities of gait and mobility (R26.89);Difficulty in walking, not elsewhere classified (R26.2)    Time: 2751-7001 PT Time Calculation (min) (ACUTE ONLY): 27 min   Charges:   PT Evaluation $PT Eval Low Complexity: 1 Low PT Treatments $Gait Training: 8-22 mins        Julien Girt, PT Acute Rehabilitation Services Pager [phone]  Office [phone]   Roxine Caddy D Elonda Husky 11/23/2018, 6:31 PM

## 2018-11-23 NOTE — Anesthesia Procedure Notes (Addendum)
Spinal  Patient location during procedure: OR End time: 11/23/2018 7:23 AM Staffing Resident/CRNA: Lissa Morales, CRNA Performed: anesthesiologist and resident/CRNA  Preanesthetic Checklist Completed: patient identified, site marked, surgical consent, pre-op evaluation, timeout performed, IV checked, risks and benefits discussed and monitors and equipment checked Spinal Block Patient position: sitting Prep: DuraPrep Patient monitoring: heart rate, continuous pulse ox and blood pressure Approach: midline Location: L3-4 Injection technique: single-shot Needle Needle type: Pencan  Needle gauge: 24 G Needle length: 9 cm Additional Notes Expiration date of kit checked and confirmed. Patient tolerated procedure well, without complications.

## 2018-11-23 NOTE — Discharge Instructions (Signed)

## 2018-11-23 NOTE — Op Note (Signed)
NAME:  Logan Wright                      MEDICAL RECORD NO.:  [id number]                             FACILITY:  [facility]      PHYSICIAN:  Pietro Cassis. Alvan Dame, M.D.  DATE OF BIRTH:  [birth year]      DATE OF PROCEDURE:  11/23/2018                                     OPERATIVE REPORT         PREOPERATIVE DIAGNOSIS:  Left knee osteoarthritis.      POSTOPERATIVE DIAGNOSIS:  Left knee osteoarthritis.      FINDINGS:  The patient was noted to have complete loss of cartilage and   bone-on-bone arthritis with associated osteophytes in the medial and patellofemroal compartments of   the knee with a significant synovitis and associated effusion.  The patient had failed months of conservative treatment including medications, injection therapy, activity modification.     PROCEDURE:  Left total knee replacement.      COMPONENTS USED:  DePuy Attune rotating platform posterior stabilized knee   system, a size 6 femur, 6 tibia, size 6 mm PS AOX insert, and 38 anatomic patellar   button.      SURGEON:  Pietro Cassis. Alvan Dame, M.D.      ASSISTANT:  Griffith Citron, PA-C.      ANESTHESIA:  Regional and Spinal.      SPECIMENS:  None.      COMPLICATION:  None.      DRAINS:  None.  EBL: <100cc      TOURNIQUET TIME:   Total Tourniquet Time Documented: Thigh (Left) - 27 minutes Total: Thigh (Left) - 27 minutes  .      The patient was stable to the recovery room.      INDICATION FOR PROCEDURE:  Logan Wright is a 73 y.o. male patient of   mine.  The patient had been seen, evaluated, and treated for months conservatively in the   office with medication, activity modification, and injections.  The patient had   radiographic changes of bone-on-bone arthritis with endplate sclerosis and osteophytes noted.  Based on the radiographic changes and failed conservative measures, the patient   decided to proceed with definitive treatment, total knee replacement.  Risks of infection, DVT, component failure, need for  revision surgery, neurovascular injury were reviewed in the office setting.  The postop course was reviewed stressing the efforts to maximize post-operative satisfaction and function.  Consent was obtained for benefit of pain   relief.      PROCEDURE IN DETAIL:  The patient was brought to the operative theater.   Once adequate anesthesia, preoperative antibiotics, 2 gm of Ancef,1 gm of Tranexamic Acid, and 10 mg of Decadron administered, the patient was positioned supine with a left thigh tourniquet placed.  The  left lower extremity was prepped and draped in sterile fashion.  A time-   out was performed identifying the patient, planned procedure, and the appropriate extremity.      The left lower extremity was placed in the [facility] leg holder.  The leg was   exsanguinated, tourniquet elevated to 250 mmHg.  A midline incision was   made  followed by median parapatellar arthrotomy.  Following initial   exposure, attention was first directed to the patella.  Precut   measurement was noted to be 23 mm.  I resected down to 14 mm and used a   38 anatomic patellar button to restore patellar height as well as cover the cut surface.      The lug holes were drilled and a metal shim was placed to protect the   patella from retractors and saw blade during the procedure.      At this point, attention was now directed to the femur.  The femoral   canal was opened with a drill, irrigated to try to prevent fat emboli.  An   intramedullary rod was passed at 5 degrees valgus, 9 mm of bone was   resected off the distal femur.  Following this resection, the tibia was   subluxated anteriorly.  Using the extramedullary guide, 2 mm of bone was resected off   the proximal medial tibia.  We confirmed the gap would be   stable medially and laterally with a size 5 spacer block as well as confirmed that the tibial cut was perpendicular in the coronal plane, checking with an alignment rod.      Once this was done, I  sized the femur to be a size 6 in the anterior-   posterior dimension, chose a standard component based on medial and   lateral dimension.  The size 6 rotation block was then pinned in   position anterior referenced using the C-clamp to set rotation.  The   anterior, posterior, and  chamfer cuts were made without difficulty nor   notching making certain that I was along the anterior cortex to help   with flexion gap stability.      The final box cut was made off the lateral aspect of distal femur.      At this point, the tibia was sized to be a size 6.  The size 6 tray was   then pinned in position through the medial third of the tubercle,   drilled, and keel punched.  Trial reduction was now carried with a 6 femur,  6 tibia, a size 6 mm PS insert, and the 38 anatomic patella botton.  The knee was brought to full extension with good flexion stability with the patella   tracking through the trochlea without application of pressure.  Given   all these findings the trial components removed.  Final components were   opened and cement was mixed.  The knee was irrigated with normal saline solution and pulse lavage.  The synovial lining was   then injected with 30 cc of 0.25% Marcaine with epinephrine, 1 cc of Toradol and 30 cc of NS for a total of 61 cc.     Final implants were then cemented onto cleaned and dried cut surfaces of bone with the knee brought to extension with a size 6 mm PS trial insert.      Once the cement had fully cured, excess cement was removed   throughout the knee.  I confirmed that I was satisfied with the range of   motion and stability, and the final size 6 mm PS AOX insert was chosen.  It was   placed into the knee.      The tourniquet had been let down at 27 minutes.  No significant   hemostasis was required.  The extensor mechanism was then reapproximated using #1 Vicryl and #  1 Stratafix sutures with the knee   in flexion.  The   remaining wound was closed with 2-0  Vicryl and running 4-0 Monocryl.   The knee was cleaned, dried, dressed sterilely using Dermabond and   Aquacel dressing.  The patient was then   brought to recovery room in stable condition, tolerating the procedure   well.   Please note that Physician Assistant, Griffith Citron, PA-C was present for the entirety of the case, and was utilized for pre-operative positioning, peri-operative retractor management, general facilitation of the procedure and for primary wound closure at the end of the case.              Pietro Cassis Alvan Dame, M.D.    11/23/2018 8:38 AM

## 2018-11-23 NOTE — Anesthesia Procedure Notes (Signed)
Anesthesia Regional Block: Adductor canal block   Pre-Anesthetic Checklist: ,, timeout performed, Correct Patient, Correct Site, Correct Laterality, Correct Procedure, Correct Position, site marked, Risks and benefits discussed,  Surgical consent,  Pre-op evaluation,  At surgeon's request and post-op pain management  Laterality: Left  Prep: chloraprep       Needles:  Injection technique: Single-shot  Needle Type: Echogenic Needle     Needle Length: 9cm  Needle Gauge: 21     Additional Needles:   Narrative:  Start time: 11/23/2018 7:02 AM End time: 11/23/2018 7:06 AM Injection made incrementally with aspirations every 5 mL.  Performed by: Personally  Anesthesiologist: Albertha Ghee, MD  Additional Notes: Pt tolerated the procedure well.

## 2018-11-24 LAB — CBC
HCT: 43.2 % (ref 39.0–52.0)
Hemoglobin: 13.8 g/dL (ref 13.0–17.0)
MCH: 28.3 pg (ref 26.0–34.0)
MCHC: 31.9 g/dL (ref 30.0–36.0)
MCV: 88.7 fL (ref 80.0–100.0)
Platelets: 248 10*3/uL (ref 150–400)
RBC: 4.87 MIL/uL (ref 4.22–5.81)
RDW: 13.8 % (ref 11.5–15.5)
WBC: 16.5 10*3/uL — ABNORMAL HIGH (ref 4.0–10.5)
nRBC: 0 % (ref 0.0–0.2)

## 2018-11-24 LAB — BASIC METABOLIC PANEL
Anion gap: 9 (ref 5–15)
BUN: 28 mg/dL — ABNORMAL HIGH (ref 8–23)
CO2: 24 mmol/L (ref 22–32)
Calcium: 8.4 mg/dL — ABNORMAL LOW (ref 8.9–10.3)
Chloride: 103 mmol/L (ref 98–111)
Creatinine, Ser: 1.4 mg/dL — ABNORMAL HIGH (ref 0.61–1.24)
GFR calc Af Amer: 57 mL/min — ABNORMAL LOW (ref >60)
GFR calc non Af Amer: 49 mL/min — ABNORMAL LOW (ref >60)
Glucose, Bld: 120 mg/dL — ABNORMAL HIGH (ref 70–99)
Potassium: 4.4 mmol/L (ref 3.5–5.1)
Sodium: 136 mmol/L (ref 135–145)

## 2018-11-24 MED ORDER — HYDROCODONE-ACETAMINOPHEN 7.5-325 MG PO TABS: 1.0000 | ORAL_TABLET | ORAL | 0 refills | Status: DC | PRN

## 2018-11-24 MED ORDER — ASPIRIN 81 MG PO CHEW: 81.0000 mg | CHEWABLE_TABLET | Freq: Two times a day (BID) | ORAL | 0 refills | Status: AC

## 2018-11-24 MED ORDER — METHOCARBAMOL 500 MG PO TABS: 500.0000 mg | ORAL_TABLET | Freq: Four times a day (QID) | ORAL | 0 refills | Status: DC | PRN

## 2018-11-24 NOTE — Progress Notes (Signed)
Physical Therapy Treatment Patient Details Name: RICK CARRUTHERS MRN: [id number] DOB: [birth year] Today's Date: 11/24/2018    History of Present Illness 73 yo male s/p L TKR on 11/23/18. PMH includes GERD, diverticulosis, vertigo, colon adenoma, OA, HTN, HLD.    PT Comments    Pt progressing with mobility and no c/o dizziness this session.  Spouse present for family ed including stair training and home therex program - written instruction provided.   Follow Up Recommendations  Follow surgeon's recommendation for DC plan and follow-up therapies;Supervision for mobility/OOB     Equipment Recommendations  Rolling walker with 5" wheels;3in1 (PT)    Recommendations for Other Services       Precautions / Restrictions Precautions Precautions: Fall Restrictions Weight Bearing Restrictions: No LLE Weight Bearing: Weight bearing as tolerated    Mobility  Bed Mobility Overal bed mobility: Needs Assistance Bed Mobility: Supine to Sit;Sit to Supine     Supine to sit: Min guard Sit to supine: Min guard   General bed mobility comments: cues for sequence  Transfers Overall transfer level: Needs assistance Equipment used: Rolling walker (2 wheeled) Transfers: Sit to/from Stand Sit to Stand: Min guard         General transfer comment: steady assist and cues for LE management  Ambulation/Gait Ambulation/Gait assistance: Min guard;Supervision Gait Distance (Feet): 150 Feet Assistive device: Rolling walker (2 wheeled) Gait Pattern/deviations: Step-to pattern;Decreased stance time - left;Decreased weight shift to left;Antalgic;Trunk flexed Gait velocity: decr    General Gait Details: cues for posture, position from RW and to slow pace for safety   Stairs             Wheelchair Mobility    Modified Rankin (Stroke Patients Only)       Balance Overall balance assessment: Mild deficits observed, not formally tested                                           Cognition Arousal/Alertness: Awake/alert Behavior During Therapy: WFL for tasks assessed/performed Overall Cognitive Status: Within Functional Limits for tasks assessed                                        Exercises Total Joint Exercises Ankle Circles/Pumps: AROM;Both;20 reps;Supine Quad Sets: AROM;Both;10 reps;Supine Heel Slides: AAROM;Left;15 reps;Supine Straight Leg Raises: AAROM;AROM;Left;15 reps;Supine Long Arc Quad: AROM;AAROM;Left;10 reps;Seated Knee Flexion: AROM;AAROM;Left;10 reps;Seated    General Comments        Pertinent Vitals/Pain Pain Assessment: 0-10 Pain Score: 5  Pain Location: L knee with flex Pain Descriptors / Indicators: Aching;Sore Pain Intervention(s): Limited activity within patient's tolerance;Monitored during session;Premedicated before session;Ice applied    Home Living                      Prior Function            PT Goals (current goals can now be found in the care plan section) Acute Rehab PT Goals Patient Stated Goal: do the right things for my knee to heal properly PT Goal Formulation: With patient Time For Goal Achievement: 11/30/18 Potential to Achieve Goals: Good Progress towards PT goals: Progressing toward goals    Frequency    7X/week      PT Plan Current plan remains appropriate    Co-evaluation  AM-PAC PT "6 Clicks" Mobility   Outcome Measure  Help needed turning from your back to your side while in a flat bed without using bedrails?: A Little Help needed moving from lying on your back to sitting on the side of a flat bed without using bedrails?: A Little Help needed moving to and from a bed to a chair (including a wheelchair)?: A Little Help needed standing up from a chair using your arms (e.g., wheelchair or bedside chair)?: A Little Help needed to walk in hospital room?: A Little Help needed climbing 3-5 steps with a railing? : A Little 6 Click Score: 18     End of Session Equipment Utilized During Treatment: Gait belt Activity Tolerance: Patient tolerated treatment well Patient left: in chair;with chair alarm set;with call bell/phone within reach;with SCD's reapplied Nurse Communication: Mobility status PT Visit Diagnosis: Other abnormalities of gait and mobility (R26.89);Difficulty in walking, not elsewhere classified (R26.2)     Time: 1120-1203 PT Time Calculation (min) (ACUTE ONLY): 43 min  Charges:  $Gait Training: 8-22 mins $Therapeutic Exercise: 8-22 mins $Therapeutic Activity: 8-22 mins                     Debe Coder PT Acute Rehabilitation Services Pager [phone] Office [phone]    Braidyn Scorsone 11/24/2018, 12:21 PM

## 2018-11-24 NOTE — Progress Notes (Signed)
     Subjective: 1 Day Post-Op Procedure(s) (LRB): TOTAL KNEE ARTHROPLASTY (Left)   Patient reports pain as mild, pain controled.  States that he really hasn't had to take much analgesic medication.  No reported events throughout the night. Many questions asked and answered.  Ready to be discharged home if he does well with PT.    Patient's anticipated LOS is less than 2 midnights, meeting these requirements: - Lives within 1 hour of care - Has a competent adult at home to recover with post-op recover - NO history of  - Chronic pain requiring opiods  - Diabetes  - Coronary Artery Disease  - Heart failure  - Heart attack  - Stroke  - DVT/VTE  - Cardiac arrhythmia  - Respiratory Failure/COPD  - Renal failure  - Anemia  - Advanced Liver disease    Objective:   VITALS:   Vitals:   11/24/18 0153 11/24/18 0707  BP: 125/68 (!) 150/85  Pulse: 64 67  Resp: 20 14  Temp: 98 F (36.7 C) 98.4 F (36.9 C)  SpO2: 97% 97%    Dorsiflexion/Plantar flexion intact Incision: dressing C/D/I No cellulitis present Compartment soft  LABS Recent Labs    11/24/18 0516  HGB 13.8  HCT 43.2  WBC 16.5*  PLT 248    Recent Labs    11/24/18 0516  NA 136  K 4.4  BUN 28*  CREATININE 1.40*  GLUCOSE 120*     Assessment/Plan: 1 Day Post-Op Procedure(s) (LRB): TOTAL KNEE ARTHROPLASTY (Left) Foley cath d/c'ed Advance diet Up with therapy D/C IV fluids Discharge home Follow up in 2 weeks at [facility] Follow up with OLIN,Reiko Vinje D in 2 weeks.  Contact information:  [facility]) [address], [place] [phone]    Obese (BMI 30-39.9) Estimated body mass index is 37.14 kg/m as calculated from the following:   Height as of this encounter: 5\' 6"  (1.676 m).   Weight as of this encounter: 104.4 kg. Patient also counseled that weight may inhibit the healing process Patient  counseled that losing weight will help with future health issues         West Pugh. Kaylib Furness   PAC  11/24/2018, 9:26 AM

## 2018-11-24 NOTE — Progress Notes (Signed)
Physical Therapy Treatment Patient Details Name: [facility] MRN: [id number] DOB: 10/10/1946 Today's Date: 11/24/2018    History of Present Illness 73 yo male s/p L TKR on 11/23/18. PMH includes GERD, diverticulosis, vertigo, colon adenoma, OA, HTN, HLD.    PT Comments    Pt cooperative but ltd by c/o dizziness with mobility - BP 145/75 - RN aware.   Follow Up Recommendations  Follow surgeon's recommendation for DC plan and follow-up therapies;Supervision for mobility/OOB     Equipment Recommendations  Rolling walker with 5" wheels;3in1 (PT)    Recommendations for Other Services       Precautions / Restrictions Precautions Precautions: Fall Restrictions Weight Bearing Restrictions: No LLE Weight Bearing: Weight bearing as tolerated    Mobility  Bed Mobility                  Transfers Overall transfer level: Needs assistance Equipment used: Rolling walker (2 wheeled) Transfers: Sit to/from Stand Sit to Stand: Min guard         General transfer comment: steady assist and cues for LE management  Ambulation/Gait Ambulation/Gait assistance: Min assist;Min guard Gait Distance (Feet): 100 Feet Assistive device: Rolling walker (2 wheeled) Gait Pattern/deviations: Step-to pattern;Decreased stance time - left;Decreased weight shift to left;Antalgic;Trunk flexed     General Gait Details: cues for posture, position from RW and to slow pace for safety   Stairs             Wheelchair Mobility    Modified Rankin (Stroke Patients Only)       Balance Overall balance assessment: Mild deficits observed, not formally tested                                          Cognition Arousal/Alertness: Awake/alert Behavior During Therapy: WFL for tasks assessed/performed Overall Cognitive Status: Within Functional Limits for tasks assessed                                        Exercises Total Joint Exercises Ankle  Circles/Pumps: AROM;Both;20 reps;Supine Quad Sets: AROM;Both;10 reps;Supine Heel Slides: AAROM;Left;15 reps;Supine Straight Leg Raises: AAROM;AROM;Left;15 reps;Supine    General Comments        Pertinent Vitals/Pain Pain Assessment: 0-10 Pain Score: 5  Pain Location: L knee with flex Pain Descriptors / Indicators: Aching;Sore Pain Intervention(s): Premedicated before session;Monitored during session;Limited activity within patient's tolerance;Ice applied    Home Living                      Prior Function            PT Goals (current goals can now be found in the care plan section) Acute Rehab PT Goals Patient Stated Goal: do the right things for my knee to heal properly PT Goal Formulation: With patient Time For Goal Achievement: 11/30/18 Potential to Achieve Goals: Good Progress towards PT goals: Progressing toward goals    Frequency    7X/week      PT Plan Current plan remains appropriate    Co-evaluation              AM-PAC PT "6 Clicks" Mobility   Outcome Measure  Help needed turning from your back to your side while in a flat bed without using bedrails?: A Little  Help needed moving from lying on your back to sitting on the side of a flat bed without using bedrails?: A Little Help needed moving to and from a bed to a chair (including a wheelchair)?: A Little Help needed standing up from a chair using your arms (e.g., wheelchair or bedside chair)?: A Little Help needed to walk in hospital room?: A Little Help needed climbing 3-5 steps with a railing? : A Little 6 Click Score: 18    End of Session Equipment Utilized During Treatment: Gait belt Activity Tolerance: Patient tolerated treatment well Patient left: in chair;with chair alarm set;with call bell/phone within reach;with SCD's reapplied Nurse Communication: Mobility status PT Visit Diagnosis: Other abnormalities of gait and mobility (R26.89);Difficulty in walking, not elsewhere  classified (R26.2)     Time: 4458-4835 PT Time Calculation (min) (ACUTE ONLY): 27 min  Charges:  $Gait Training: 8-22 mins $Therapeutic Exercise: 8-22 mins                     [place] Pager [phone] Office [phone]    Dalyah Pla 11/24/2018, 12:15 PM

## 2018-11-24 NOTE — Plan of Care (Signed)
Plan of care reviewed and discussed with the patient. 

## 2018-11-25 ENCOUNTER — Encounter (HOSPITAL_COMMUNITY): Payer: Self-pay | Admitting: Orthopedic Surgery

## 2018-11-26 DIAGNOSIS — M25662 Stiffness of left knee, not elsewhere classified: Secondary | ICD-10-CM | POA: Diagnosis not present

## 2018-11-29 DIAGNOSIS — M25662 Stiffness of left knee, not elsewhere classified: Secondary | ICD-10-CM | POA: Diagnosis not present

## 2018-11-30 NOTE — Discharge Summary (Signed)
Physician Discharge Summary  Patient ID: [facility] MRN: [id number] DOB/AGE: Mar 10, 1946 73 y.o.  Admit date: 11/23/2018 Discharge date: 11/24/2018   Procedures:  Procedure(s) (LRB): TOTAL KNEE ARTHROPLASTY (Left)  Attending Physician:  Dr. Paralee Cancel   Admission Diagnoses:    Left knee primary OA / pain  Discharge Diagnoses:  Active Problems:   Status post total left knee replacement  Past Medical History:  Diagnosis Date  . CKD (chronic kidney disease), stage III ([place])   . Colon adenoma 2015  . Diverticulosis    DENIES   . DJD (degenerative joint disease)   . ED (erectile dysfunction)   . Elevated blood pressure 2007-2008  . Esophageal reflux   . GERD (gastroesophageal reflux disease)   . Gout   . Gynecomastia    LEFT  . Hearing loss   . HTN (hypertension)   . Hypercholesteremia   . Non-cardiac chest pain   . OA (osteoarthritis)    LEFT SHOULDER  . Onychomycosis of foot with other complication   . Pre-diabetes   . Seborrhea   . Severe frontal headaches    RESOLVED   . Vertigo    RESOLVED   . Wears hearing aid     HPI:    Logan Wright, 73 y.o. male, has a history of pain and functional disability in the left knee due to arthritis and has failed non-surgical conservative treatments for greater than 12 weeks to include NSAID's and/or analgesics, corticosteriod injections and activity modification.  Onset of symptoms was gradual, starting 1+ years ago with gradually worsening course since that time. The patient noted prior procedures on the knee to include  arthroscopy on the left knee.  Patient currently rates pain in the left knee(s) at 7 out of 10 with activity. Patient has worsening of pain with activity and weight bearing, pain that interferes with activities of daily living, pain with passive range of motion, crepitus and joint swelling.  Patient has evidence of periarticular osteophytes and joint space narrowing by imaging studies.  There is no active  infection.   Risks, benefits and expectations were discussed with the patient.  Risks including but not limited to the risk of anesthesia, blood clots, nerve damage, blood vessel damage, failure of the prosthesis, infection and up to and including death.  Patient understand the risks, benefits and expectations and wishes to proceed with surgery.   PCP: [name], MD   Discharged Condition: good  Hospital Course:  Patient underwent the above stated procedure on 11/23/2018. Patient tolerated the procedure well and brought to the recovery room in good condition and subsequently to the floor.  POD #1 BP: 150/85 ; Pulse: 67 ; Temp: 98.4 F (36.9 C) ; Resp: 14 Patient reports pain as mild, pain controled.  States that he really hasn't had to take much analgesic medication.  No reported events throughout the night. Many questions asked and answered.  Ready to be discharged home. Dorsiflexion/plantar flexion intact, incision: dressing C/D/I, no cellulitis present and compartment soft.   LABS  Basename    HGB     13.8  HCT     43.2    Discharge Exam: General appearance: alert, cooperative and no distress Extremities: Homans sign is negative, no sign of DVT, no edema, redness or tenderness in the calves or thighs and no ulcers, gangrene or trophic changes  Disposition:  Home with follow up in 2 weeks   Follow-up Information    Paralee Cancel, MD. Schedule an appointment  as soon as possible for a visit in 2 weeks.   Specialty:  Orthopedic Surgery Contact information: [address] [place] [address] [phone]           Discharge Instructions    Call MD / Call 911   Complete by:  As directed    If you experience chest pain or shortness of breath, CALL 911 and be transported to the hospital emergency room.  If you develope a fever above 101 F, pus (white drainage) or increased drainage or redness at the wound, or calf pain, call your surgeon's office.   Change  dressing   Complete by:  As directed    Maintain surgical dressing until follow up in the clinic. If the edges start to pull up, may reinforce with tape. If the dressing is no longer working, may remove and cover with gauze and tape, but must keep the area dry and clean.  Call with any questions or concerns.   Constipation Prevention   Complete by:  As directed    Drink plenty of fluids.  Prune juice may be helpful.  You may use a stool softener, such as Colace (over the counter) 100 mg twice a day.  Use MiraLax (over the counter) for constipation as needed.   Diet - low sodium heart healthy   Complete by:  As directed    Discharge instructions   Complete by:  As directed    Maintain surgical dressing until follow up in the clinic. If the edges start to pull up, may reinforce with tape. If the dressing is no longer working, may remove and cover with gauze and tape, but must keep the area dry and clean.  Follow up in 2 weeks at [facility]. Call with any questions or concerns.   Increase activity slowly as tolerated   Complete by:  As directed    Weight bearing as tolerated with assist device (walker, cane, etc) as directed, use it as long as suggested by your surgeon or therapist, typically at least 4-6 weeks.   TED hose   Complete by:  As directed    Use stockings (TED hose) for 2 weeks on both leg(s).  You may remove them at night for sleeping.      Allergies as of 11/24/2018   No Known Allergies     Medication List    STOP taking these medications   aspirin EC 81 MG tablet Replaced by:  aspirin 81 MG chewable tablet   Potassium 99 MG Tabs     TAKE these medications   allopurinol 100 MG tablet Commonly known as:  ZYLOPRIM Take 200 mg by mouth daily.   amLODipine 2.5 MG tablet Commonly known as:  NORVASC Take 2.5 mg by mouth daily.   aspirin 81 MG chewable tablet Commonly known as:  ASPIRIN CHILDRENS Chew 1 tablet (81 mg total) by mouth 2 (two) times daily for  30 days. Take for 4 weeks, then resume regular dose. Replaces:  aspirin EC 81 MG tablet   atorvastatin 40 MG tablet Commonly known as:  LIPITOR Take 40 mg by mouth daily.   docusate sodium 100 MG capsule Commonly known as:  COLACE Take 1 capsule (100 mg total) by mouth 2 (two) times daily.   ferrous sulfate 325 (65 FE) MG tablet Commonly known as:  FERROUSUL Take 1 tablet (325 mg total) by mouth 3 (three) times daily with meals.   FISH OIL PO Take 2,800 mg by mouth daily.  GLUCOSAMINE-CHONDROITIN PO Take 2 tablets by mouth daily.   HYDROcodone-acetaminophen 7.5-325 MG tablet Commonly known as:  NORCO Take 1-2 tablets by mouth every 4 (four) hours as needed for moderate pain.   losartan-hydrochlorothiazide 50-12.5 MG tablet Commonly known as:  HYZAAR TAKE 1 TABLET BY MOUTH DAILY.   methocarbamol 500 MG tablet Commonly known as:  ROBAXIN Take 1 tablet (500 mg total) by mouth every 6 (six) hours as needed for muscle spasms.   multivitamin tablet Take 1 tablet by mouth daily.   polyethylene glycol packet Commonly known as:  MIRALAX / GLYCOLAX Take 17 g by mouth 2 (two) times daily.   TURMERIC PO Take by mouth.            Discharge Care Instructions  (From admission, onward)         Start     Ordered   11/24/18 0000  Change dressing    Comments:  Maintain surgical dressing until follow up in the clinic. If the edges start to pull up, may reinforce with tape. If the dressing is no longer working, may remove and cover with gauze and tape, but must keep the area dry and clean.  Call with any questions or concerns.   11/24/18 3112           Signed: West Pugh. Heman Que   PA-C  11/30/2018, 11:05 AM

## 2018-12-01 DIAGNOSIS — M25662 Stiffness of left knee, not elsewhere classified: Secondary | ICD-10-CM | POA: Diagnosis not present

## 2018-12-03 DIAGNOSIS — M25662 Stiffness of left knee, not elsewhere classified: Secondary | ICD-10-CM | POA: Diagnosis not present

## 2018-12-06 DIAGNOSIS — M25662 Stiffness of left knee, not elsewhere classified: Secondary | ICD-10-CM | POA: Diagnosis not present

## 2018-12-08 DIAGNOSIS — M25662 Stiffness of left knee, not elsewhere classified: Secondary | ICD-10-CM | POA: Diagnosis not present

## 2018-12-10 DIAGNOSIS — M25662 Stiffness of left knee, not elsewhere classified: Secondary | ICD-10-CM | POA: Diagnosis not present

## 2018-12-14 DIAGNOSIS — M25662 Stiffness of left knee, not elsewhere classified: Secondary | ICD-10-CM | POA: Diagnosis not present

## 2018-12-17 DIAGNOSIS — M25662 Stiffness of left knee, not elsewhere classified: Secondary | ICD-10-CM | POA: Diagnosis not present

## 2018-12-20 ENCOUNTER — Ambulatory Visit: Payer: PPO | Admitting: Cardiology

## 2018-12-21 DIAGNOSIS — R5383 Other fatigue: Secondary | ICD-10-CM | POA: Diagnosis not present

## 2018-12-21 DIAGNOSIS — H8110 Benign paroxysmal vertigo, unspecified ear: Secondary | ICD-10-CM | POA: Diagnosis not present

## 2018-12-21 DIAGNOSIS — M25662 Stiffness of left knee, not elsewhere classified: Secondary | ICD-10-CM | POA: Diagnosis not present

## 2018-12-23 DIAGNOSIS — M25662 Stiffness of left knee, not elsewhere classified: Secondary | ICD-10-CM | POA: Diagnosis not present

## 2018-12-28 DIAGNOSIS — M25662 Stiffness of left knee, not elsewhere classified: Secondary | ICD-10-CM | POA: Diagnosis not present

## 2019-01-05 DIAGNOSIS — M25662 Stiffness of left knee, not elsewhere classified: Secondary | ICD-10-CM | POA: Diagnosis not present

## 2019-01-06 DIAGNOSIS — Z96652 Presence of left artificial knee joint: Secondary | ICD-10-CM | POA: Diagnosis not present

## 2019-01-06 DIAGNOSIS — Z471 Aftercare following joint replacement surgery: Secondary | ICD-10-CM | POA: Diagnosis not present

## 2019-01-07 DIAGNOSIS — M25662 Stiffness of left knee, not elsewhere classified: Secondary | ICD-10-CM | POA: Diagnosis not present

## 2019-01-11 DIAGNOSIS — M25662 Stiffness of left knee, not elsewhere classified: Secondary | ICD-10-CM | POA: Diagnosis not present

## 2019-01-13 DIAGNOSIS — N183 Chronic kidney disease, stage 3 unspecified: Secondary | ICD-10-CM | POA: Diagnosis not present

## 2019-01-13 DIAGNOSIS — I890 Lymphedema, not elsewhere classified: Secondary | ICD-10-CM | POA: Diagnosis not present

## 2019-01-13 DIAGNOSIS — E1122 Type 2 diabetes mellitus with diabetic chronic kidney disease: Secondary | ICD-10-CM | POA: Diagnosis not present

## 2019-01-13 DIAGNOSIS — E7800 Pure hypercholesterolemia, unspecified: Secondary | ICD-10-CM | POA: Diagnosis not present

## 2019-01-13 DIAGNOSIS — I10 Essential (primary) hypertension: Secondary | ICD-10-CM | POA: Diagnosis not present

## 2019-01-13 DIAGNOSIS — M170 Bilateral primary osteoarthritis of knee: Secondary | ICD-10-CM | POA: Diagnosis not present

## 2019-01-13 DIAGNOSIS — M25662 Stiffness of left knee, not elsewhere classified: Secondary | ICD-10-CM | POA: Diagnosis not present

## 2019-01-13 DIAGNOSIS — Z6835 Body mass index (BMI) 35.0-35.9, adult: Secondary | ICD-10-CM | POA: Diagnosis not present

## 2019-01-13 DIAGNOSIS — Z23 Encounter for immunization: Secondary | ICD-10-CM | POA: Diagnosis not present

## 2019-01-18 DIAGNOSIS — Z1231 Encounter for screening mammogram for malignant neoplasm of breast: Secondary | ICD-10-CM | POA: Diagnosis not present

## 2019-01-18 DIAGNOSIS — M25662 Stiffness of left knee, not elsewhere classified: Secondary | ICD-10-CM | POA: Diagnosis not present

## 2019-01-20 DIAGNOSIS — E785 Hyperlipidemia, unspecified: Secondary | ICD-10-CM | POA: Diagnosis not present

## 2019-01-20 DIAGNOSIS — E1140 Type 2 diabetes mellitus with diabetic neuropathy, unspecified: Secondary | ICD-10-CM | POA: Diagnosis not present

## 2019-01-20 DIAGNOSIS — N400 Enlarged prostate without lower urinary tract symptoms: Secondary | ICD-10-CM | POA: Diagnosis not present

## 2019-01-20 DIAGNOSIS — D638 Anemia in other chronic diseases classified elsewhere: Secondary | ICD-10-CM | POA: Diagnosis not present

## 2019-01-20 DIAGNOSIS — M109 Gout, unspecified: Secondary | ICD-10-CM | POA: Diagnosis not present

## 2019-01-20 DIAGNOSIS — R350 Frequency of micturition: Secondary | ICD-10-CM | POA: Diagnosis not present

## 2019-01-20 DIAGNOSIS — M25662 Stiffness of left knee, not elsewhere classified: Secondary | ICD-10-CM | POA: Diagnosis not present

## 2019-01-25 DIAGNOSIS — M25662 Stiffness of left knee, not elsewhere classified: Secondary | ICD-10-CM | POA: Diagnosis not present

## 2019-01-25 DIAGNOSIS — N184 Chronic kidney disease, stage 4 (severe): Secondary | ICD-10-CM | POA: Diagnosis not present

## 2019-01-25 DIAGNOSIS — E113592 Type 2 diabetes mellitus with proliferative diabetic retinopathy without macular edema, left eye: Secondary | ICD-10-CM | POA: Diagnosis not present

## 2019-01-25 DIAGNOSIS — I129 Hypertensive chronic kidney disease with stage 1 through stage 4 chronic kidney disease, or unspecified chronic kidney disease: Secondary | ICD-10-CM | POA: Diagnosis not present

## 2019-01-25 DIAGNOSIS — R42 Dizziness and giddiness: Secondary | ICD-10-CM | POA: Diagnosis not present

## 2019-01-28 DIAGNOSIS — R600 Localized edema: Secondary | ICD-10-CM | POA: Diagnosis not present

## 2019-01-28 DIAGNOSIS — M25662 Stiffness of left knee, not elsewhere classified: Secondary | ICD-10-CM | POA: Diagnosis not present

## 2019-01-28 DIAGNOSIS — C182 Malignant neoplasm of ascending colon: Secondary | ICD-10-CM | POA: Diagnosis not present

## 2019-01-28 DIAGNOSIS — R0602 Shortness of breath: Secondary | ICD-10-CM | POA: Diagnosis not present

## 2019-02-01 DIAGNOSIS — M25662 Stiffness of left knee, not elsewhere classified: Secondary | ICD-10-CM | POA: Diagnosis not present

## 2019-02-03 DIAGNOSIS — M25662 Stiffness of left knee, not elsewhere classified: Secondary | ICD-10-CM | POA: Diagnosis not present

## 2019-02-04 DIAGNOSIS — M109 Gout, unspecified: Secondary | ICD-10-CM | POA: Diagnosis not present

## 2019-02-04 DIAGNOSIS — I10 Essential (primary) hypertension: Secondary | ICD-10-CM | POA: Diagnosis not present

## 2019-02-04 DIAGNOSIS — E782 Mixed hyperlipidemia: Secondary | ICD-10-CM | POA: Diagnosis not present

## 2019-02-04 DIAGNOSIS — R739 Hyperglycemia, unspecified: Secondary | ICD-10-CM | POA: Diagnosis not present

## 2019-02-04 DIAGNOSIS — Z9889 Other specified postprocedural states: Secondary | ICD-10-CM | POA: Diagnosis not present

## 2019-02-04 DIAGNOSIS — N183 Chronic kidney disease, stage 3 (moderate): Secondary | ICD-10-CM | POA: Diagnosis not present

## 2019-02-17 DIAGNOSIS — M25662 Stiffness of left knee, not elsewhere classified: Secondary | ICD-10-CM | POA: Diagnosis not present

## 2019-02-24 DIAGNOSIS — M25662 Stiffness of left knee, not elsewhere classified: Secondary | ICD-10-CM | POA: Diagnosis not present

## 2019-03-02 DIAGNOSIS — M25662 Stiffness of left knee, not elsewhere classified: Secondary | ICD-10-CM | POA: Diagnosis not present

## 2019-03-09 DIAGNOSIS — M25662 Stiffness of left knee, not elsewhere classified: Secondary | ICD-10-CM | POA: Diagnosis not present

## 2019-05-31 ENCOUNTER — Encounter (HOSPITAL_BASED_OUTPATIENT_CLINIC_OR_DEPARTMENT_OTHER): Payer: Self-pay | Admitting: *Deleted

## 2019-05-31 ENCOUNTER — Other Ambulatory Visit: Payer: Self-pay

## 2019-05-31 ENCOUNTER — Emergency Department (HOSPITAL_BASED_OUTPATIENT_CLINIC_OR_DEPARTMENT_OTHER)
Admission: EM | Admit: 2019-05-31 | Discharge: 2019-05-31 | Disposition: A | Discharge status: Home / Self Care | Payer: PPO | Attending: Emergency Medicine | Admitting: Emergency Medicine

## 2019-05-31 DIAGNOSIS — Z87891 Personal history of nicotine dependence: Secondary | ICD-10-CM | POA: Insufficient documentation

## 2019-05-31 DIAGNOSIS — N183 Chronic kidney disease, stage 3 (moderate): Secondary | ICD-10-CM | POA: Insufficient documentation

## 2019-05-31 DIAGNOSIS — Z203 Contact with and (suspected) exposure to rabies: Secondary | ICD-10-CM | POA: Diagnosis not present

## 2019-05-31 DIAGNOSIS — Z79899 Other long term (current) drug therapy: Secondary | ICD-10-CM | POA: Diagnosis not present

## 2019-05-31 DIAGNOSIS — Z96652 Presence of left artificial knee joint: Secondary | ICD-10-CM | POA: Insufficient documentation

## 2019-05-31 DIAGNOSIS — I129 Hypertensive chronic kidney disease with stage 1 through stage 4 chronic kidney disease, or unspecified chronic kidney disease: Secondary | ICD-10-CM | POA: Insufficient documentation

## 2019-05-31 DIAGNOSIS — Z23 Encounter for immunization: Secondary | ICD-10-CM | POA: Insufficient documentation

## 2019-05-31 DIAGNOSIS — Z96641 Presence of right artificial hip joint: Secondary | ICD-10-CM | POA: Insufficient documentation

## 2019-05-31 DIAGNOSIS — Z96642 Presence of left artificial hip joint: Secondary | ICD-10-CM | POA: Insufficient documentation

## 2019-05-31 MED ORDER — RABIES IMMUNE GLOBULIN 150 UNIT/ML IM INJ
20.0000 [IU]/kg | INJECTION | Freq: Once | INTRAMUSCULAR | Status: AC
  Administered 2019-05-31: 1950 [IU] via INTRAMUSCULAR
  Filled 2019-05-31: qty 14

## 2019-05-31 MED ORDER — RABIES VACCINE, PCEC IM SUSR
1.0000 mL | Freq: Once | INTRAMUSCULAR | Status: AC
  Administered 2019-05-31: 1 mL via INTRAMUSCULAR
  Filled 2019-05-31: qty 1

## 2019-05-31 NOTE — ED Triage Notes (Signed)
Bat in house over night while sleeping  No known bite

## 2019-05-31 NOTE — ED Provider Notes (Signed)
[facility] Provider Note   CSN: [id number] Arrival date & time: 05/31/19  1715     History   Chief Complaint Chief Complaint  Patient presents with  . Rabies Injection    HPI Logan Wright is a 73 y.o. male.  He is here for evaluation of a potential rabies exposure.  He said they had a bat in the house while they were sleeping 3 nights ago.  At the primary care doctor's request they are here to get rabies vaccine.  No medical complaints.     The history is provided by the patient.  Animal Bite Contact animal:  Bat Animal bite location: unknown. Time since incident:  3 days Pain details:    Quality: none. Incident location:  Home Provoked: unprovoked   Notifications:  None Animal's rabies vaccination status:  Unknown Animal in possession: no   Tetanus status:  Up to date Relieved by:  Nothing Worsened by:  Nothing Ineffective treatments:  None tried Associated symptoms: no fever     Past Medical History:  Diagnosis Date  . CKD (chronic kidney disease), stage III ([place])   . Colon adenoma 2015  . Diverticulosis    DENIES   . DJD (degenerative joint disease)   . ED (erectile dysfunction)   . Elevated blood pressure 2007-2008  . Esophageal reflux   . GERD (gastroesophageal reflux disease)   . Gout   . Gynecomastia    LEFT  . Hearing loss   . HTN (hypertension)   . Hypercholesteremia   . Non-cardiac chest pain   . OA (osteoarthritis)    LEFT SHOULDER  . Onychomycosis of foot with other complication   . Pre-diabetes   . Seborrhea   . Severe frontal headaches    RESOLVED   . Vertigo    RESOLVED   . Wears hearing aid     Patient Active Problem List   Diagnosis Date Noted  . 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 Non-cardiac chest pain   . Gynecomastia   . Hypercholesteremia   . Seborrhea   . Severe frontal headaches   . GERD (gastroesophageal reflux disease)   . ED (erectile dysfunction)   .  Diverticulosis   . Elevated blood pressure   . Vertigo   . Onychomycosis of foot with other complication   . Hearing loss   . Colon adenoma   . OA (osteoarthritis)   . Esophageal reflux   . Chest pain 07/08/2015  . Essential hypertension 07/08/2015  . Hyperlipidemia 07/08/2015    Past Surgical History:  Procedure Laterality Date  . COLONSCOPY     . HIP ARTHROPLASTY Bilateral    AT [place]   . KNEE ARTHROSCOPY     UNSURE WHICH KNEE   . TOTAL KNEE ARTHROPLASTY Left 11/23/2018   Procedure: TOTAL KNEE ARTHROPLASTY;  Surgeon: Paralee Cancel, MD;  Location: [facility];  Service: Orthopedics;  Laterality: Left;  70 mins        Home Medications    Prior to Admission medications   Medication Sig Start Date End Date Taking? Authorizing Provider  allopurinol (ZYLOPRIM) 100 MG tablet Take 200 mg by mouth daily.    [provider]  amLODipine (NORVASC) 2.5 MG tablet Take 2.5 mg by mouth daily.    [provider]  atorvastatin (LIPITOR) 40 MG tablet Take 40 mg by mouth daily.    [provider]  docusate sodium (COLACE) 100 MG capsule Take 1 capsule (100  mg total) by mouth 2 (two) times daily. 11/23/18   Danae Orleans, PA-C  ferrous sulfate (FERROUSUL) 325 (65 FE) MG tablet Take 1 tablet (325 mg total) by mouth 3 (three) times daily with meals. 11/23/18   Danae Orleans, PA-C  GLUCOSAMINE-CHONDROITIN PO Take 2 tablets by mouth daily.    [provider]  HYDROcodone-acetaminophen (NORCO) 7.5-325 MG tablet Take 1-2 tablets by mouth every 4 (four) hours as needed for moderate pain. 11/24/18   Danae Orleans, PA-C  losartan-hydrochlorothiazide (HYZAAR) 50-12.5 MG tablet TAKE 1 TABLET BY MOUTH DAILY. 01/10/16   Belva Crome, MD  methocarbamol (ROBAXIN) 500 MG tablet Take 1 tablet (500 mg total) by mouth every 6 (six) hours as needed for muscle spasms. 11/24/18   Danae Orleans, PA-C  Multiple Vitamin (MULTIVITAMIN) tablet Take 1 tablet by mouth daily.      [provider]  Omega-3 Fatty Acids (FISH OIL PO) Take 2,800 mg by mouth daily.     [provider]  polyethylene glycol (MIRALAX / GLYCOLAX) packet Take 17 g by mouth 2 (two) times daily. 11/23/18   Danae Orleans, PA-C  TURMERIC PO Take by mouth.    [provider]    Family History Family History  Problem Relation Age of Onset  . Heart attack Mother   . Stroke Mother   . Heart failure Father        28  . Hypertension Neg Hx     Social History Social History   Tobacco Use  . Smoking status: Former Smoker    Packs/day: 0.25    Quit date: 07/08/1965    Years since quitting: 53.9  . Smokeless tobacco: Never Used  Substance Use Topics  . Alcohol use: No    Alcohol/week: 0.0 standard drinks  . Drug use: No     Allergies   Patient has no known allergies.   Review of Systems Review of Systems  Constitutional: Negative for fever.  Skin: Negative for wound.     Physical Exam Updated Vital Signs BP 130/80 (BP Location: Left Arm)   Pulse 64   Temp 98.9 F (37.2 C) (Oral)   Resp 16   Ht 5\' 7"  (1.702 m)   Wt 96.6 kg   SpO2 98%   BMI 33.36 kg/m   Physical Exam Vitals signs and nursing note reviewed.  Constitutional:      Appearance: He is well-developed.  HENT:     Head: Normocephalic and atraumatic.  Eyes:     Conjunctiva/sclera: Conjunctivae normal.  Neck:     Musculoskeletal: Neck supple.  Cardiovascular:     Rate and Rhythm: Normal rate and regular rhythm.  Pulmonary:     Effort: Pulmonary effort is normal.     Breath sounds: Normal breath sounds.  Skin:    General: Skin is warm and dry.  Neurological:     General: No focal deficit present.     Mental Status: He is alert. Mental status is at baseline.     GCS: GCS eye subscore is 4. GCS verbal subscore is 5. GCS motor subscore is 6.      ED Treatments / Results  Labs (all labs ordered are listed, but only abnormal results are displayed) Labs Reviewed - No data to  display  EKG None  Radiology No results found.  Procedures Procedures (including critical care time)  Medications Ordered in ED Medications  rabies immune globulin (HYPERAB/KEDRAB) injection 1,950 Units (1,950 Units Intramuscular Given 05/31/19 1822)  rabies vaccine (RABAVERT)  injection 1 mL (1 mL Intramuscular Given 05/31/19 1819)     Initial Impression / Assessment and Plan / ED Course  I have reviewed the triage vital signs and the nursing notes.  Pertinent labs & imaging results that were available during my care of the patient were reviewed by me and considered in my medical decision making (see chart for details).  Clinical Course as of May 31 902  Tue May 31, 2019  1822 Patient was counseled regarding rabies potential exposure and risks and benefits of vaccine.  They are in agreement to get the vaccine.  He understands will require further vaccinations over the course of the next few weeks.   [MB]    Clinical Course User Index [MB] Hayden Rasmussen, MD        Final Clinical Impressions(s) / ED Diagnoses   Final diagnoses:  Contact with and (suspected) exposure to rabies    ED Discharge Orders    None       Hayden Rasmussen, MD 06/01/19 [phone]

## 2019-06-03 ENCOUNTER — Other Ambulatory Visit: Payer: Self-pay

## 2019-06-03 ENCOUNTER — Emergency Department (INDEPENDENT_AMBULATORY_CARE_PROVIDER_SITE_OTHER)
Admission: EM | Admit: 2019-06-03 | Discharge: 2019-06-03 | Disposition: A | Discharge status: Home / Self Care | Payer: PPO | Source: Home / Self Care

## 2019-06-03 ENCOUNTER — Encounter: Payer: Self-pay | Admitting: Emergency Medicine

## 2019-06-03 DIAGNOSIS — Z203 Contact with and (suspected) exposure to rabies: Secondary | ICD-10-CM

## 2019-06-03 DIAGNOSIS — Z23 Encounter for immunization: Secondary | ICD-10-CM

## 2019-06-03 MED ORDER — RABIES VACCINE, PCEC IM SUSR
1.0000 mL | Freq: Once | INTRAMUSCULAR | Status: AC
  Administered 2019-06-03: 1 mL via INTRAMUSCULAR

## 2019-06-03 NOTE — ED Triage Notes (Signed)
Patient here for follow up rabies vaccinations; initial visit 05/31/19; no adverse reaction to injections. He has not travelled past 4 weeks.

## 2019-06-07 ENCOUNTER — Other Ambulatory Visit: Payer: Self-pay

## 2019-06-07 ENCOUNTER — Emergency Department (INDEPENDENT_AMBULATORY_CARE_PROVIDER_SITE_OTHER)
Admission: EM | Admit: 2019-06-07 | Discharge: 2019-06-07 | Disposition: A | Discharge status: Home / Self Care | Payer: PPO | Source: Home / Self Care

## 2019-06-07 DIAGNOSIS — Z23 Encounter for immunization: Secondary | ICD-10-CM

## 2019-06-07 MED ORDER — RABIES VACCINE, PCEC IM SUSR
1.0000 mL | Freq: Once | INTRAMUSCULAR | Status: AC
  Administered 2019-06-07: 1 mL via INTRAMUSCULAR

## 2019-06-07 NOTE — ED Triage Notes (Signed)
Pt here for rabies injection. 

## 2019-06-14 ENCOUNTER — Emergency Department (INDEPENDENT_AMBULATORY_CARE_PROVIDER_SITE_OTHER)
Admission: EM | Admit: 2019-06-14 | Discharge: 2019-06-14 | Disposition: A | Discharge status: Home / Self Care | Payer: PPO | Source: Home / Self Care

## 2019-06-14 ENCOUNTER — Encounter: Payer: Self-pay | Admitting: Emergency Medicine

## 2019-06-14 ENCOUNTER — Other Ambulatory Visit: Payer: Self-pay

## 2019-06-14 DIAGNOSIS — Z203 Contact with and (suspected) exposure to rabies: Secondary | ICD-10-CM

## 2019-06-14 MED ORDER — RABIES VACCINE, PCEC IM SUSR
1.0000 mL | Freq: Once | INTRAMUSCULAR | Status: AC
  Administered 2019-06-14: 1 mL via INTRAMUSCULAR

## 2019-06-14 NOTE — ED Triage Notes (Signed)
Here for day 14 rabies injection

## 2019-07-12 DIAGNOSIS — N183 Chronic kidney disease, stage 3 (moderate): Secondary | ICD-10-CM | POA: Diagnosis not present

## 2019-07-12 DIAGNOSIS — M1610 Unilateral primary osteoarthritis, unspecified hip: Secondary | ICD-10-CM | POA: Diagnosis not present

## 2019-07-12 DIAGNOSIS — I10 Essential (primary) hypertension: Secondary | ICD-10-CM | POA: Diagnosis not present

## 2019-07-12 DIAGNOSIS — M1710 Unilateral primary osteoarthritis, unspecified knee: Secondary | ICD-10-CM | POA: Diagnosis not present

## 2019-07-12 DIAGNOSIS — E785 Hyperlipidemia, unspecified: Secondary | ICD-10-CM | POA: Diagnosis not present

## 2019-07-12 DIAGNOSIS — E782 Mixed hyperlipidemia: Secondary | ICD-10-CM | POA: Diagnosis not present

## 2019-07-19 DIAGNOSIS — E8881 Metabolic syndrome: Secondary | ICD-10-CM | POA: Diagnosis not present

## 2019-07-19 DIAGNOSIS — N183 Chronic kidney disease, stage 3 unspecified: Secondary | ICD-10-CM | POA: Diagnosis not present

## 2019-07-19 DIAGNOSIS — E782 Mixed hyperlipidemia: Secondary | ICD-10-CM | POA: Diagnosis not present

## 2019-07-19 DIAGNOSIS — Z9889 Other specified postprocedural states: Secondary | ICD-10-CM | POA: Diagnosis not present

## 2019-07-19 DIAGNOSIS — Z0000 Encounter for general adult medical examination without abnormal findings: Secondary | ICD-10-CM | POA: Diagnosis not present

## 2019-07-19 DIAGNOSIS — Z125 Encounter for screening for malignant neoplasm of prostate: Secondary | ICD-10-CM | POA: Diagnosis not present

## 2019-07-19 DIAGNOSIS — M109 Gout, unspecified: Secondary | ICD-10-CM | POA: Diagnosis not present

## 2019-07-19 DIAGNOSIS — R5383 Other fatigue: Secondary | ICD-10-CM | POA: Diagnosis not present

## 2019-07-19 DIAGNOSIS — K219 Gastro-esophageal reflux disease without esophagitis: Secondary | ICD-10-CM | POA: Diagnosis not present

## 2019-07-21 DIAGNOSIS — M1610 Unilateral primary osteoarthritis, unspecified hip: Secondary | ICD-10-CM | POA: Diagnosis not present

## 2019-07-21 DIAGNOSIS — E785 Hyperlipidemia, unspecified: Secondary | ICD-10-CM | POA: Diagnosis not present

## 2019-07-21 DIAGNOSIS — I10 Essential (primary) hypertension: Secondary | ICD-10-CM | POA: Diagnosis not present

## 2019-07-21 DIAGNOSIS — N183 Chronic kidney disease, stage 3 unspecified: Secondary | ICD-10-CM | POA: Diagnosis not present

## 2019-07-21 DIAGNOSIS — E782 Mixed hyperlipidemia: Secondary | ICD-10-CM | POA: Diagnosis not present

## 2019-07-21 DIAGNOSIS — M1710 Unilateral primary osteoarthritis, unspecified knee: Secondary | ICD-10-CM | POA: Diagnosis not present

## 2019-08-10 ENCOUNTER — Encounter: Payer: Self-pay | Admitting: Cardiology

## 2019-08-10 ENCOUNTER — Other Ambulatory Visit: Payer: Self-pay

## 2019-08-10 ENCOUNTER — Telehealth (HOSPITAL_COMMUNITY): Payer: Self-pay | Admitting: *Deleted

## 2019-08-10 ENCOUNTER — Telehealth (INDEPENDENT_AMBULATORY_CARE_PROVIDER_SITE_OTHER): Payer: PPO | Admitting: Cardiology

## 2019-08-10 VITALS — BP 130/78 | HR 63 | Ht 67.0 in | Wt 216.0 lb

## 2019-08-10 DIAGNOSIS — I10 Essential (primary) hypertension: Secondary | ICD-10-CM | POA: Diagnosis not present

## 2019-08-10 DIAGNOSIS — Z6833 Body mass index (BMI) 33.0-33.9, adult: Secondary | ICD-10-CM | POA: Diagnosis not present

## 2019-08-10 DIAGNOSIS — E785 Hyperlipidemia, unspecified: Secondary | ICD-10-CM | POA: Diagnosis not present

## 2019-08-10 DIAGNOSIS — R0600 Dyspnea, unspecified: Secondary | ICD-10-CM

## 2019-08-10 DIAGNOSIS — E6609 Other obesity due to excess calories: Secondary | ICD-10-CM

## 2019-08-10 DIAGNOSIS — R0609 Other forms of dyspnea: Secondary | ICD-10-CM

## 2019-08-10 NOTE — Patient Instructions (Addendum)
Medication Instructions:  Your physician recommends that you continue on your current medications as directed. Please refer to the Current Medication list given to you today.   *If you need a refill on your cardiac medications before your next appointment, please call your pharmacy*  Lab Work: None   If you have labs (blood work) drawn today and your tests are completely normal, you will receive your results only by: Marland Kitchen MyChart Message (if you have MyChart) OR . A paper copy in the mail If you have any lab test that is abnormal or we need to change your treatment, we will call you to review the results.  Testing/Procedures: Your physician has requested that you have a lexiscan myoview. For further information please visit [email or web address]. Please follow instruction sheet, as given.   Follow-Up: Follow up with Dr. Acie Fredrickson as needed   Other Instructions  Lifestyle Modifications to Prevent and Treat Heart Disease -Recommend heart healthy/Mediterranean diet, with whole grains, fruits, vegetables, fish, lean meats, nuts, olive oil and avocado oil.  -Limit salt intake to less than 2000 mg per day.  -Recommend moderate walking, starting slowly with a few minutes and working up to 3-5 times/week for 30-50 minutes each session. Aim for at least 150 minutes.week. Goal should be pace of 3 miles/hours, or walking 1.5 miles in 30 minutes -Recommend avoidance of tobacco products. Avoid excess alcohol. -Keep blood pressure well controlled, ideally less than 130/80.     DASH Eating Plan DASH stands for "Dietary Approaches to Stop Hypertension." The DASH eating plan is a healthy eating plan that has been shown to reduce high blood pressure (hypertension). It may also reduce your risk for type 2 diabetes, heart disease, and stroke. The DASH eating plan may also help with weight loss. What are tips for following this plan?  General guidelines  Avoid eating more than 2,300 mg (milligrams) of salt  (sodium) a day. If you have hypertension, you may need to reduce your sodium intake to 1,500 mg a day.  Limit alcohol intake to no more than 1 drink a day for nonpregnant women and 2 drinks a day for men. One drink equals 12 oz of beer, 5 oz of wine, or 1 oz of hard liquor.  Work with your health care provider to maintain a healthy body weight or to lose weight. Ask what an ideal weight is for you.  Get at least 30 minutes of exercise that causes your heart to beat faster (aerobic exercise) most days of the week. Activities may include walking, swimming, or biking.  Work with your health care provider or diet and nutrition specialist (dietitian) to adjust your eating plan to your individual calorie needs. Reading food labels   Check food labels for the amount of sodium per serving. Choose foods with less than 5 percent of the Daily Value of sodium. Generally, foods with less than 300 mg of sodium per serving fit into this eating plan.  To find whole grains, look for the word "whole" as the first word in the ingredient list. Shopping  Buy products labeled as "low-sodium" or "no salt added."  Buy fresh foods. Avoid canned foods and premade or frozen meals. Cooking  Avoid adding salt when cooking. Use salt-free seasonings or herbs instead of table salt or sea salt. Check with your health care provider or pharmacist before using salt substitutes.  Do not fry foods. Cook foods using healthy methods such as baking, boiling, grilling, and broiling instead.  Cook with heart-healthy oils, such  as olive, canola, soybean, or sunflower oil. Meal planning  Eat a balanced diet that includes: ? 5 or more servings of fruits and vegetables each day. At each meal, try to fill half of your plate with fruits and vegetables. ? Up to 6-8 servings of whole grains each day. ? Less than 6 oz of lean meat, poultry, or fish each day. A 3-oz serving of meat is about the same size as a deck of cards. One egg  equals 1 oz. ? 2 servings of low-fat dairy each day. ? A serving of nuts, seeds, or beans 5 times each week. ? Heart-healthy fats. Healthy fats called Omega-3 fatty acids are found in foods such as flaxseeds and coldwater fish, like sardines, salmon, and mackerel.  Limit how much you eat of the following: ? Canned or prepackaged foods. ? Food that is high in trans fat, such as fried foods. ? Food that is high in saturated fat, such as fatty meat. ? Sweets, desserts, sugary drinks, and other foods with added sugar. ? Full-fat dairy products.  Do not salt foods before eating.  Try to eat at least 2 vegetarian meals each week.  Eat more home-cooked food and less restaurant, buffet, and fast food.  When eating at a restaurant, ask that your food be prepared with less salt or no salt, if possible. What foods are recommended? The items listed may not be a complete list. Talk with your dietitian about what dietary choices are best for you. Grains Whole-grain or whole-wheat bread. Whole-grain or whole-wheat pasta. Brown rice. [place]. Bulgur. Whole-grain and low-sodium cereals. Pita bread. Low-fat, low-sodium crackers. Whole-wheat flour tortillas. Vegetables Fresh or frozen vegetables (raw, steamed, roasted, or grilled). Low-sodium or reduced-sodium tomato and vegetable juice. Low-sodium or reduced-sodium tomato sauce and tomato paste. Low-sodium or reduced-sodium canned vegetables. Fruits All fresh, dried, or frozen fruit. Canned fruit in natural juice (without added sugar). Meat and other protein foods Skinless chicken or [place]. Ground chicken or [place]. Pork with fat trimmed off. Fish and seafood. Egg whites. Dried beans, peas, or lentils. Unsalted nuts, nut butters, and seeds. Unsalted canned beans. Lean cuts of beef with fat trimmed off. Low-sodium, lean deli meat. Dairy Low-fat (1%) or fat-free (skim) milk. Fat-free, low-fat, or reduced-fat cheeses. Nonfat, low-sodium ricotta or  cottage cheese. Low-fat or nonfat yogurt. Low-fat, low-sodium cheese. Fats and oils Soft margarine without trans fats. Vegetable oil. Low-fat, reduced-fat, or light mayonnaise and salad dressings (reduced-sodium). Canola, safflower, olive, soybean, and sunflower oils. Avocado. Seasoning and other foods Herbs. Spices. Seasoning mixes without salt. Unsalted popcorn and pretzels. Fat-free sweets. What foods are not recommended? The items listed may not be a complete list. Talk with your dietitian about what dietary choices are best for you. Grains Baked goods made with fat, such as croissants, muffins, or some breads. Dry pasta or rice meal packs. Vegetables Creamed or fried vegetables. Vegetables in a cheese sauce. Regular canned vegetables (not low-sodium or reduced-sodium). Regular canned tomato sauce and paste (not low-sodium or reduced-sodium). Regular tomato and vegetable juice (not low-sodium or reduced-sodium). Angie Fava. Olives. Fruits Canned fruit in a light or heavy syrup. Fried fruit. Fruit in cream or butter sauce. Meat and other protein foods Fatty cuts of meat. Ribs. Fried meat. Berniece Salines. Sausage. Bologna and other processed lunch meats. Salami. Fatback. Hotdogs. Bratwurst. Salted nuts and seeds. Canned beans with added salt. Canned or smoked fish. Whole eggs or egg yolks. Chicken or [place] with skin. Dairy Whole or 2% milk, cream, and  half-and-half. Whole or full-fat cream cheese. Whole-fat or sweetened yogurt. Full-fat cheese. Nondairy creamers. Whipped toppings. Processed cheese and cheese spreads. Fats and oils Butter. Stick margarine. Lard. Shortening. Ghee. Bacon fat. Tropical oils, such as coconut, palm kernel, or palm oil. Seasoning and other foods Salted popcorn and pretzels. Onion salt, garlic salt, seasoned salt, table salt, and sea salt. Worcestershire sauce. Tartar sauce. Barbecue sauce. Teriyaki sauce. Soy sauce, including reduced-sodium. Steak sauce. Canned and packaged  gravies. Fish sauce. Oyster sauce. Cocktail sauce. Horseradish that you find on the shelf. Ketchup. Mustard. Meat flavorings and tenderizers. Bouillon cubes. Hot sauce and Tabasco sauce. Premade or packaged marinades. Premade or packaged taco seasonings. Relishes. Regular salad dressings. Where to find more information:  National Heart, Lung, and [place]: https://wilson-eaton.com/  American Heart Association: www.heart.org Summary  The DASH eating plan is a healthy eating plan that has been shown to reduce high blood pressure (hypertension). It may also reduce your risk for type 2 diabetes, heart disease, and stroke.  With the DASH eating plan, you should limit salt (sodium) intake to 2,300 mg a day. If you have hypertension, you may need to reduce your sodium intake to 1,500 mg a day.  When on the DASH eating plan, aim to eat more fresh fruits and vegetables, whole grains, lean proteins, low-fat dairy, and heart-healthy fats.  Work with your health care provider or diet and nutrition specialist (dietitian) to adjust your eating plan to your individual calorie needs. This information is not intended to replace advice given to you by your health care provider. Make sure you discuss any questions you have with your health care provider. Document Released: 09/18/2011 Document Revised: 09/11/2017 Document Reviewed: 09/22/2016 Elsevier Patient Education  2020 [employer].

## 2019-08-10 NOTE — Progress Notes (Signed)
Virtual Visit via Telephone Note   This visit type was conducted due to national recommendations for restrictions regarding the COVID-19 Pandemic (e.g. social distancing) in an effort to limit this patient's exposure and mitigate transmission in our community.  Due to his co-morbid illnesses, this patient is at least at moderate risk for complications without adequate follow up.  This format is felt to be most appropriate for this patient at this time.  The patient did not have access to video technology/had technical difficulties with video requiring transitioning to audio format only (telephone).  All issues noted in this document were discussed and addressed.  No physical exam could be performed with this format.  Please refer to the patient's chart for his  consent to telehealth for [facility].   Date:  08/10/2019   ID:  Logan Wright, DOB July 31, 1946, MRN [id number]  Patient Location: Home Provider Location: Home  PCP:  [name], MD  Cardiologist:  No primary care provider on file.   ?Smith/[name] Electrophysiologist:  None   Evaluation Performed:  Follow-Up Visit  Chief Complaint:  Shortness of breath  History of Present Illness:    Logan Wright is a 73 y.o. male with hypertension, hyperlipidemia, CKD stage III.  The patient has been followed in the past also for atypical chest pain that did not warrant ischemic evaluation.  He had been followed by Dr. Tamala Julian in 2016.  He was recently seen by Dr. Acie Fredrickson in 11/2018 for cardiac clearance for left total knee replacement at which time his blood pressure was well controlled and he was not having any cardiac issues.  He was felt to be low risk for surgery. He was planned to follow up only as needed.   The Patient was referred to our office by Dr. Jacelyn Grip for evaluation of possible bradycardia and some shortness of breath with activity.   Pt walks about 45-60 minutes daily. Lately he is feeling more "winded" by the end of the walk  which is not usual for him. He also does 20 minutes of aerobic exercise several times per week with his wife. He has not had any chest discomfort at all. He has had a couple of episodes of feeling woozy, while walking or driving, lightheaded and floaty. This occurs once or twice a month. He has had no syncope. This has not caused him too much inconvenience he just wanted to get checked out 'In an abundence of caution".   With his rehab after his knee replacement (6 months ago) one time he got very woozy, very dizzy. This occurred when sitting up after laying on his back. They checked his BP which was fine.  He felt weak for a couple of hours after.  He went to Dr. Jacelyn Grip at the time and checked out fine.   Home BPs 120-140/65-80.  HR are always greater than 60.    The patient does not have symptoms concerning for COVID-19 infection (fever, chills, cough, or new shortness of breath).    Past Medical History:  Diagnosis Date  . CKD (chronic kidney disease), stage III   . Colon adenoma 2015  . Diverticulosis    DENIES   . DJD (degenerative joint disease)   . ED (erectile dysfunction)   . Elevated blood pressure 2007-2008  . Esophageal reflux   . GERD (gastroesophageal reflux disease)   . Gout   . Gynecomastia    LEFT  . Hearing loss   . HTN (hypertension)   .  Hypercholesteremia   . Non-cardiac chest pain   . OA (osteoarthritis)    LEFT SHOULDER  . Onychomycosis of foot with other complication   . Pre-diabetes   . Seborrhea   . Severe frontal headaches    RESOLVED   . Vertigo    RESOLVED   . Wears hearing aid    Past Surgical History:  Procedure Laterality Date  . COLONSCOPY     . HIP ARTHROPLASTY Bilateral    AT [place]   . KNEE ARTHROSCOPY     UNSURE WHICH KNEE   . 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 allopurinol (ZYLOPRIM) 100 MG tablet Take 200 mg by mouth daily.  Marland Kitchen atorvastatin (LIPITOR) 40 MG tablet Take 40 mg by mouth daily.  Marland Kitchen docusate sodium (COLACE) 100 MG capsule Take 1 capsule (100 mg total) by mouth 2 (two) times daily.  Marland Kitchen GLUCOSAMINE-CHONDROITIN PO Take 2 tablets by mouth daily.  Marland Kitchen losartan-hydrochlorothiazide (HYZAAR) 50-12.5 MG tablet TAKE 1 TABLET BY MOUTH DAILY.  . Multiple Vitamin (MULTIVITAMIN) tablet Take 1 tablet by mouth daily.   . Omega-3 Fatty Acids (FISH OIL PO) Take 2,800 mg by mouth daily.   . polyethylene glycol (MIRALAX / GLYCOLAX) packet Take 17 g by mouth 2 (two) times daily.  . [DISCONTINUED] amLODipine (NORVASC) 2.5 MG tablet Take 2.5 mg by mouth daily.  . [DISCONTINUED] TURMERIC PO Take by mouth.     Allergies:   Patient has no known allergies.   Social History   Tobacco Use  . Smoking status: Former Smoker    Packs/day: 0.25    Quit date: 07/08/1965    Years since quitting: 54.1  . Smokeless tobacco: Never Used  Substance Use Topics  . Alcohol use: No    Alcohol/week: 0.0 standard drinks  . Drug use: No     Family Hx: The patient's family history includes Heart attack in his mother; Heart failure in his father; Stroke in his mother. There is no history of Hypertension.  ROS:   Please see the history of present illness.     All other systems reviewed and are negative.   Prior CV studies:   The following studies were reviewed today:  No recent testing  Labs/Other Tests and Data Reviewed:    EKG:  An ECG dated 07/19/2019 was personally reviewed today and demonstrated:  Sinus bradycardia, 57 bpm, no ischemic changes  Recent Labs: 11/24/2018: BUN 28; Creatinine, Ser 1.40; Hemoglobin 13.8; Platelets 248; Potassium 4.4; Sodium 136   Recent Lipid Panel No results found for: CHOL, TRIG, HDL, CHOLHDL, LDLCALC, LDLDIRECT  Wt Readings from Last 3 Encounters:  08/10/19 216 lb (98 kg)  06/03/19 211 lb 10.3 oz (96 kg)  05/31/19 213 lb  (96.6 kg)     Objective:    Vital Signs:  BP 130/78   Pulse 63   Ht 5\' 7"  (1.702 m)   Wt 216 lb (98 kg)   BMI 33.83 kg/m    VITAL SIGNS:  reviewed GEN:  no acute distress RESPIRATORY:  No audible wheezes or cough noted over the phone.  Patient is able to speak in complete sentences. NEURO:  alert and oriented x 3, no obvious focal deficit PSYCH:  normal affect  ASSESSMENT & PLAN:    DOE -Pt is able to walk  but has noted a recent increase in shortness of breath towards the end of his walk which is new for him.  No chest pain.  He has had some occasional wooziness with walking. This has been occurring since his knee replacement and may just be related to surgery, however, he does have CVD risk factors including hypertension, hyperlipidemia and obesity.  He also notes that his father had bypass surgery at about this age. -We will pursue a Lexiscan Myoview to evaluate for possible ischemia.  The patient is in agreement with this plan.  Hypertension -On amlodipine 2.5 mg daily, losartan-hydrochlorothiazide 50-12.5 mg daily -BP well controlled.   Hyperlipidemia -Lipid panel from 07/19/19: TC 167, Trig 375, HDL 33, LDL 59. Advise to decrease sugar intake for triglyceride control. (pt was not fasting for this test) -Continue atorvastatin 40 mg  CKD stage 3 -Labs from 07/19/2019: SCr 1.26, GFR 56  Obesity -Body mass index is 33.83 kg/m.  -Patient advised on heart healthy diet and exercise.   COVID-19 Education: The signs and symptoms of COVID-19 were discussed with the patient and how to seek care for testing (follow up with PCP or arrange E-visit).  The importance of social distancing was discussed today.  Time:   Today, I have spent 20 minutes with the patient with telehealth technology discussing the above problems.     Medication Adjustments/Labs and Tests Ordered: Current medicines are reviewed at length with the patient today.  Concerns regarding medicines are outlined  above.   Tests Ordered: No orders of the defined types were placed in this encounter.   Medication Changes: No orders of the defined types were placed in this encounter.   Follow Up:  Either In Person or Virtual prn  Signed, Daune Perch, NP  08/10/2019 10:37 AM    [place]

## 2019-08-10 NOTE — Telephone Encounter (Signed)
Patient given detailed instructions per Myocardial Perfusion Study Information Sheet for the test on 08/17/2019 at 1015. Patient notified to arrive 15 minutes early and that it is imperative to arrive on time for appointment to keep from having the test rescheduled.  If you need to cancel or reschedule your appointment, please call the office within 24 hours of your appointment. . Patient verbalized understanding.Rosine Solecki, Ranae Palms    Patient does not want my chart letter sent with instructions

## 2019-08-17 ENCOUNTER — Ambulatory Visit (HOSPITAL_COMMUNITY): Payer: PPO | Attending: Cardiology

## 2019-08-17 ENCOUNTER — Other Ambulatory Visit: Payer: Self-pay

## 2019-08-17 DIAGNOSIS — R0600 Dyspnea, unspecified: Secondary | ICD-10-CM | POA: Diagnosis not present

## 2019-08-17 DIAGNOSIS — R0609 Other forms of dyspnea: Secondary | ICD-10-CM

## 2019-08-17 LAB — MYOCARDIAL PERFUSION IMAGING
LV dias vol: 81 mL (ref 62–150)
LV sys vol: 26 mL
Peak HR: 84 {beats}/min
Rest HR: 59 {beats}/min
SDS: 1
SRS: 0
SSS: 1
TID: 0.96

## 2019-08-17 MED ORDER — REGADENOSON 0.4 MG/5ML IV SOLN
0.4000 mg | Freq: Once | INTRAVENOUS | Status: AC
  Administered 2019-08-17: 0.4 mg via INTRAVENOUS

## 2019-08-17 MED ORDER — TECHNETIUM TC 99M TETROFOSMIN IV KIT
9.8000 | PACK | Freq: Once | INTRAVENOUS | Status: AC | PRN
  Administered 2019-08-17: 9.8 via INTRAVENOUS
  Filled 2019-08-17: qty 10

## 2019-08-17 MED ORDER — TECHNETIUM TC 99M TETROFOSMIN IV KIT
32.5000 | PACK | Freq: Once | INTRAVENOUS | Status: AC | PRN
  Administered 2019-08-17: 32.5 via INTRAVENOUS
  Filled 2019-08-17: qty 33

## 2019-10-05 DIAGNOSIS — H40013 Open angle with borderline findings, low risk, bilateral: Secondary | ICD-10-CM | POA: Diagnosis not present

## 2019-10-05 DIAGNOSIS — H5213 Myopia, bilateral: Secondary | ICD-10-CM | POA: Diagnosis not present

## 2019-10-05 DIAGNOSIS — H2513 Age-related nuclear cataract, bilateral: Secondary | ICD-10-CM | POA: Diagnosis not present

## 2019-10-05 DIAGNOSIS — H25043 Posterior subcapsular polar age-related cataract, bilateral: Secondary | ICD-10-CM | POA: Diagnosis not present

## 2019-10-12 DIAGNOSIS — M1710 Unilateral primary osteoarthritis, unspecified knee: Secondary | ICD-10-CM | POA: Diagnosis not present

## 2019-10-12 DIAGNOSIS — N183 Chronic kidney disease, stage 3 unspecified: Secondary | ICD-10-CM | POA: Diagnosis not present

## 2019-10-12 DIAGNOSIS — M1610 Unilateral primary osteoarthritis, unspecified hip: Secondary | ICD-10-CM | POA: Diagnosis not present

## 2019-10-12 DIAGNOSIS — I10 Essential (primary) hypertension: Secondary | ICD-10-CM | POA: Diagnosis not present

## 2019-10-12 DIAGNOSIS — E782 Mixed hyperlipidemia: Secondary | ICD-10-CM | POA: Diagnosis not present

## 2019-10-12 DIAGNOSIS — E785 Hyperlipidemia, unspecified: Secondary | ICD-10-CM | POA: Diagnosis not present

## 2019-10-14 DIAGNOSIS — C50919 Malignant neoplasm of unspecified site of unspecified female breast: Secondary | ICD-10-CM

## 2019-10-14 HISTORY — DX: Malignant neoplasm of unspecified site of unspecified female breast: C50.919

## 2019-11-20 ENCOUNTER — Ambulatory Visit: Payer: PPO | Attending: Internal Medicine

## 2019-11-20 DIAGNOSIS — Z23 Encounter for immunization: Secondary | ICD-10-CM | POA: Insufficient documentation

## 2019-11-20 NOTE — Progress Notes (Signed)
   Covid-19 Vaccination Clinic  Name:  Logan Wright    MRN: [id number] DOB: 03-23-1946  11/20/2019  Logan Wright was observed post Covid-19 immunization for 15 minutes without incidence. He was provided with Vaccine Information Sheet and instruction to access the V-Safe system.   Logan Wright was instructed to call 911 with any severe reactions post vaccine: Marland Kitchen Difficulty breathing  . Swelling of your face and throat  . A fast heartbeat  . A bad rash all over your body  . Dizziness and weakness    Immunizations Administered    Name Date Dose VIS Date Route   Pfizer COVID-19 Vaccine 11/20/2019  2:30 PM 0.3 mL 09/23/2019 Intramuscular   Manufacturer: [place]   Lot: [id number]   Portal: [address]-2

## 2019-11-21 DIAGNOSIS — E782 Mixed hyperlipidemia: Secondary | ICD-10-CM | POA: Diagnosis not present

## 2019-11-21 DIAGNOSIS — M1610 Unilateral primary osteoarthritis, unspecified hip: Secondary | ICD-10-CM | POA: Diagnosis not present

## 2019-11-21 DIAGNOSIS — I10 Essential (primary) hypertension: Secondary | ICD-10-CM | POA: Diagnosis not present

## 2019-11-21 DIAGNOSIS — E785 Hyperlipidemia, unspecified: Secondary | ICD-10-CM | POA: Diagnosis not present

## 2019-11-21 DIAGNOSIS — M1710 Unilateral primary osteoarthritis, unspecified knee: Secondary | ICD-10-CM | POA: Diagnosis not present

## 2019-12-05 ENCOUNTER — Ambulatory Visit: Payer: PPO

## 2019-12-14 ENCOUNTER — Ambulatory Visit: Payer: PPO

## 2019-12-14 ENCOUNTER — Ambulatory Visit: Payer: PPO | Attending: Internal Medicine

## 2019-12-14 DIAGNOSIS — Z23 Encounter for immunization: Secondary | ICD-10-CM | POA: Insufficient documentation

## 2019-12-14 NOTE — Progress Notes (Signed)
   Covid-19 Vaccination Clinic  Name:  Logan Wright    MRN: [id number] DOB: [birth year]  12/14/2019  [name] was observed post Covid-19 immunization for 15 minutes without incident. He was provided with Vaccine Information Sheet and instruction to access the V-Safe system.   [name] was instructed to call 911 with any severe reactions post vaccine: Marland Kitchen Difficulty breathing  . Swelling of face and throat  . A fast heartbeat  . A bad rash all over body  . Dizziness and weakness   Immunizations Administered    Name Date Dose VIS Date Route   Pfizer COVID-19 Vaccine 12/14/2019  9:34 AM 0.3 mL 09/23/2019 Intramuscular   Manufacturer: [place]   Lot: [id number]   [place]: [id number]

## 2020-01-20 DIAGNOSIS — N183 Chronic kidney disease, stage 3 unspecified: Secondary | ICD-10-CM | POA: Diagnosis not present

## 2020-01-20 DIAGNOSIS — I10 Essential (primary) hypertension: Secondary | ICD-10-CM | POA: Diagnosis not present

## 2020-01-20 DIAGNOSIS — E785 Hyperlipidemia, unspecified: Secondary | ICD-10-CM | POA: Diagnosis not present

## 2020-01-20 DIAGNOSIS — M1710 Unilateral primary osteoarthritis, unspecified knee: Secondary | ICD-10-CM | POA: Diagnosis not present

## 2020-01-20 DIAGNOSIS — M1610 Unilateral primary osteoarthritis, unspecified hip: Secondary | ICD-10-CM | POA: Diagnosis not present

## 2020-01-20 DIAGNOSIS — E782 Mixed hyperlipidemia: Secondary | ICD-10-CM | POA: Diagnosis not present

## 2020-01-27 DIAGNOSIS — N6310 Unspecified lump in the right breast, unspecified quadrant: Secondary | ICD-10-CM | POA: Diagnosis not present

## 2020-01-30 ENCOUNTER — Other Ambulatory Visit: Payer: Self-pay | Admitting: Family Medicine

## 2020-01-30 ENCOUNTER — Ambulatory Visit: Payer: PPO | Admitting: Dermatology

## 2020-01-30 ENCOUNTER — Encounter: Payer: Self-pay | Admitting: Dermatology

## 2020-01-30 ENCOUNTER — Other Ambulatory Visit: Payer: Self-pay

## 2020-01-30 DIAGNOSIS — N6310 Unspecified lump in the right breast, unspecified quadrant: Secondary | ICD-10-CM

## 2020-01-30 DIAGNOSIS — L821 Other seborrheic keratosis: Secondary | ICD-10-CM

## 2020-01-30 DIAGNOSIS — D229 Melanocytic nevi, unspecified: Secondary | ICD-10-CM | POA: Diagnosis not present

## 2020-01-30 NOTE — Progress Notes (Signed)
   Follow-Up Visit   Subjective  Logan Wright is a 74 y.o. male who presents for the following: Annual Exam (no new concerns).  moles Location: All over Duration: Years Quality: Increased number dry spots Associated Signs/Symptoms: No symptoms Modifying Factors:  Severity:  Timing: Context:   The following portions of the chart were reviewed this encounter and updated as appropriate:     Objective  Well appearing patient in no apparent distress; mood and affect are within normal limits. Normal foot to scalp exam. A full examination was performed including scalp, head, eyes, ears, nose, lips, neck, chest, axillae, abdomen, back, buttocks, bilateral upper extremities, bilateral lower extremities, hands, feet, fingers, toes, fingernails, and toenails. All findings within normal limits unless otherwise noted below.   Assessment & Plan  Nothing currently requires removal or close observation.

## 2020-01-31 ENCOUNTER — Encounter: Payer: Self-pay | Admitting: Dermatology

## 2020-02-21 ENCOUNTER — Other Ambulatory Visit: Payer: Self-pay

## 2020-02-21 ENCOUNTER — Ambulatory Visit
Admission: RE | Admit: 2020-02-21 | Discharge: 2020-02-21 | Disposition: A | Discharge status: Home / Self Care | Payer: PPO | Source: Ambulatory Visit | Attending: Family Medicine | Admitting: Family Medicine

## 2020-02-21 ENCOUNTER — Other Ambulatory Visit: Payer: Self-pay | Admitting: Family Medicine

## 2020-02-21 DIAGNOSIS — N6310 Unspecified lump in the right breast, unspecified quadrant: Secondary | ICD-10-CM

## 2020-03-02 ENCOUNTER — Ambulatory Visit
Admission: RE | Admit: 2020-03-02 | Discharge: 2020-03-02 | Disposition: A | Discharge status: Home / Self Care | Payer: PPO | Source: Ambulatory Visit | Attending: Family Medicine | Admitting: Family Medicine

## 2020-03-02 ENCOUNTER — Other Ambulatory Visit: Payer: Self-pay | Admitting: Body Imaging

## 2020-03-02 ENCOUNTER — Other Ambulatory Visit: Payer: Self-pay

## 2020-03-02 DIAGNOSIS — N6311 Unspecified lump in the right breast, upper outer quadrant: Secondary | ICD-10-CM | POA: Diagnosis not present

## 2020-03-02 DIAGNOSIS — N6310 Unspecified lump in the right breast, unspecified quadrant: Secondary | ICD-10-CM

## 2020-03-02 DIAGNOSIS — C50421 Malignant neoplasm of upper-outer quadrant of right male breast: Secondary | ICD-10-CM | POA: Diagnosis not present

## 2020-03-06 ENCOUNTER — Other Ambulatory Visit: Payer: Self-pay | Admitting: Surgery

## 2020-03-06 DIAGNOSIS — Z853 Personal history of malignant neoplasm of breast: Secondary | ICD-10-CM

## 2020-03-06 DIAGNOSIS — C50911 Malignant neoplasm of unspecified site of right female breast: Secondary | ICD-10-CM | POA: Diagnosis not present

## 2020-03-07 ENCOUNTER — Telehealth: Payer: Self-pay | Admitting: Hematology and Oncology

## 2020-03-07 ENCOUNTER — Telehealth: Payer: Self-pay | Admitting: Oncology

## 2020-03-07 NOTE — Telephone Encounter (Signed)
Mr. Ridener has been rescheduled to see Dr. Jana Hakim on 6/15 at 4pm w/labs at 3:30pm d/t Dr. Lindi Adie being out of the office.

## 2020-03-07 NOTE — Telephone Encounter (Signed)
Received a new pt referral from Dr. [name] for breast cancer. Logan Wright has been cld and scheduled to see Dr. Lindi Adie on 6/15 at 345pm. I offered an appt prior to surgery, but the pt declined bc he has to self-quarantine prior to surgery. Pt aware to arrive 15 minutes early.

## 2020-03-08 ENCOUNTER — Other Ambulatory Visit: Payer: Self-pay

## 2020-03-08 ENCOUNTER — Encounter (HOSPITAL_BASED_OUTPATIENT_CLINIC_OR_DEPARTMENT_OTHER): Payer: Self-pay | Admitting: Surgery

## 2020-03-13 ENCOUNTER — Other Ambulatory Visit (HOSPITAL_COMMUNITY)
Admission: RE | Admit: 2020-03-13 | Discharge: 2020-03-13 | Disposition: A | Discharge status: Home / Self Care | Payer: PPO | Source: Ambulatory Visit | Attending: Surgery | Admitting: Surgery

## 2020-03-13 ENCOUNTER — Encounter (HOSPITAL_BASED_OUTPATIENT_CLINIC_OR_DEPARTMENT_OTHER)
Admission: RE | Admit: 2020-03-13 | Discharge: 2020-03-13 | Disposition: A | Discharge status: Home / Self Care | Payer: PPO | Source: Ambulatory Visit | Attending: Surgery | Admitting: Surgery

## 2020-03-13 DIAGNOSIS — Z20822 Contact with and (suspected) exposure to covid-19: Secondary | ICD-10-CM | POA: Diagnosis not present

## 2020-03-13 DIAGNOSIS — Z01812 Encounter for preprocedural laboratory examination: Secondary | ICD-10-CM | POA: Diagnosis not present

## 2020-03-13 LAB — BASIC METABOLIC PANEL
Anion gap: 13 (ref 5–15)
BUN: 19 mg/dL (ref 8–23)
CO2: 23 mmol/L (ref 22–32)
Calcium: 9.4 mg/dL (ref 8.9–10.3)
Chloride: 101 mmol/L (ref 98–111)
Creatinine, Ser: 1.24 mg/dL (ref 0.61–1.24)
GFR calc Af Amer: >60 mL/min (ref >60)
GFR calc non Af Amer: 57 mL/min — ABNORMAL LOW (ref >60)
Glucose, Bld: 140 mg/dL — ABNORMAL HIGH (ref 70–99)
Potassium: 4.3 mmol/L (ref 3.5–5.1)
Sodium: 137 mmol/L (ref 135–145)

## 2020-03-13 LAB — SARS CORONAVIRUS 2 (TAT 6-24 HRS): SARS Coronavirus 2: NEGATIVE

## 2020-03-13 MED ORDER — CHLORHEXIDINE GLUCONATE CLOTH 2 % EX PADS: 6.0000 | MEDICATED_PAD | Freq: Once | CUTANEOUS | Status: DC

## 2020-03-13 MED ORDER — ENSURE PRE-SURGERY PO LIQD: 296.0000 mL | Freq: Once | ORAL | Status: DC

## 2020-03-13 NOTE — Progress Notes (Signed)

## 2020-03-14 ENCOUNTER — Encounter: Payer: Self-pay | Admitting: Adult Health

## 2020-03-14 DIAGNOSIS — C50421 Malignant neoplasm of upper-outer quadrant of right male breast: Secondary | ICD-10-CM | POA: Insufficient documentation

## 2020-03-14 DIAGNOSIS — Z170 Estrogen receptor positive status [ER+]: Secondary | ICD-10-CM | POA: Insufficient documentation

## 2020-03-14 NOTE — Progress Notes (Signed)
Location of Breast Cancer: Right Breast Cancer- [facility]   Did patient present with symptoms (if so, please note symptoms) or was this found on screening mammography?: Patient felt a mass in the upper outer quadrant of his right breast approximately 4-5 months ago.  It has slowly been getting larger.  Mammogram: 3.2 x 3.2 x 2.3 cm.  Histology per Pathology Report: Right breast 03/02/2020  Receptor Status: ER(90% +), PR (5 % +), Her2-neu (-), Ki-67(15%)    Past/Anticipated interventions by surgeon, if any: Dr. [name] 03/06/2020 -This is a large mass and requires a mastectomy to remove.  He does not want any neoadjuvant therapy and wants to proceed to the operating room as soon as possible. -Right breast mastectomy with SLN biopsy 03/16/2020   Past/Anticipated interventions by medical oncology, if any: Chemotherapy  Dr. Lindi Adie 03/27/2020  Lymphedema issues, if any: No    Pain issues, if any:  no  SAFETY ISSUES:  Prior radiation? No  Pacemaker/ICD? No  Possible current pregnancy?  n/a  Is the patient on methotrexate? No  Current Complaints / other details:      Cori Razor, RN 03/14/2020,3:11 PM

## 2020-03-15 ENCOUNTER — Other Ambulatory Visit: Payer: Self-pay

## 2020-03-15 ENCOUNTER — Ambulatory Visit
Admission: RE | Admit: 2020-03-15 | Discharge: 2020-03-15 | Disposition: A | Discharge status: Home / Self Care | Payer: PPO | Source: Ambulatory Visit | Attending: Radiation Oncology | Admitting: Radiation Oncology

## 2020-03-15 ENCOUNTER — Encounter: Payer: Self-pay | Admitting: Radiation Oncology

## 2020-03-15 VITALS — BP 152/77 | HR 73 | Temp 97.2°F | Resp 20 | Ht 67.0 in | Wt 224.8 lb

## 2020-03-15 DIAGNOSIS — C50421 Malignant neoplasm of upper-outer quadrant of right male breast: Secondary | ICD-10-CM | POA: Diagnosis not present

## 2020-03-15 DIAGNOSIS — Z170 Estrogen receptor positive status [ER+]: Secondary | ICD-10-CM | POA: Diagnosis not present

## 2020-03-15 DIAGNOSIS — N183 Chronic kidney disease, stage 3 unspecified: Secondary | ICD-10-CM | POA: Insufficient documentation

## 2020-03-15 DIAGNOSIS — E7800 Pure hypercholesterolemia, unspecified: Secondary | ICD-10-CM | POA: Diagnosis not present

## 2020-03-15 DIAGNOSIS — I129 Hypertensive chronic kidney disease with stage 1 through stage 4 chronic kidney disease, or unspecified chronic kidney disease: Secondary | ICD-10-CM | POA: Insufficient documentation

## 2020-03-15 DIAGNOSIS — N529 Male erectile dysfunction, unspecified: Secondary | ICD-10-CM | POA: Diagnosis not present

## 2020-03-15 DIAGNOSIS — M1990 Unspecified osteoarthritis, unspecified site: Secondary | ICD-10-CM | POA: Insufficient documentation

## 2020-03-15 DIAGNOSIS — K219 Gastro-esophageal reflux disease without esophagitis: Secondary | ICD-10-CM | POA: Diagnosis not present

## 2020-03-15 DIAGNOSIS — Z87891 Personal history of nicotine dependence: Secondary | ICD-10-CM | POA: Insufficient documentation

## 2020-03-15 DIAGNOSIS — Z79899 Other long term (current) drug therapy: Secondary | ICD-10-CM | POA: Diagnosis not present

## 2020-03-15 NOTE — H&P (Signed)
Jacklynn Barnacle Appointment: 03/06/2020 9:50 AM Location: [facility] Patient #: [id number] DOB: 01/24/1946 Married / Language: Cleophus Molt / Race: White Male   History of Present Illness Nathaneil Canary A. [name] MD; 03/06/2020 10:16 AM) The patient is a 74 year old male who presents with breast cancer.  Chief complaint: Right breast cancer  This is a 74 year old gentleman with a recent diagnosis of a right breast cancer. He reports feeling a mass in the upper-outer quadrant of the left breast approximate 4-5 months ago. It is been slowly getting larger. He denies nipple discharge. He has no arm swelling. 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 The rest of the profile is currently pending. There is no family history of breast cancer. He is otherwise healthy without complaints.   Past Surgical History Malachi Bonds, CMA; 03/06/2020 9:48 AM) Hip Surgery  Bilateral. Knee Surgery  Left.  Diagnostic Studies History Malachi Bonds, CMA; 03/06/2020 9:48 AM) Colonoscopy  1-5 years ago  Allergies Malachi Bonds, CMA; 03/06/2020 9:49 AM) No Known Drug Allergies  [03/06/2020]:  Medication History Malachi Bonds, CMA; 03/06/2020 9:49 AM) Allopurinol (100MG  Tablet, Oral) Active. Atorvastatin Calcium (40MG  Tablet, Oral) Active. Losartan Potassium-HCTZ (50-12.5MG  Tablet, Oral) Active. Medications Reconciled  Social History Malachi Bonds, CMA; 03/06/2020 9:48 AM) Caffeine use  Coffee. Tobacco use  Never smoker.  Other Problems Malachi Bonds, CMA; 03/06/2020 9:48 AM) Gastroesophageal Reflux Disease     Review of Systems (Chemira Jones CMA; 03/06/2020 9:49 AM) General Not Present- Appetite Loss, Chills, Fatigue, Fever, Night Sweats, Weight Gain and Weight Loss. Skin Not Present- Change in Wart/Mole, Dryness, Hives, Jaundice, New Lesions, Non-Healing Wounds, Rash and Ulcer. HEENT Not Present- Earache, Hearing Loss, Hoarseness, Nose Bleed, Oral Ulcers,  Ringing in the Ears, Seasonal Allergies, Sinus Pain, Sore Throat, Visual Disturbances, Wears glasses/contact lenses and Yellow Eyes. Respiratory Not Present- Bloody sputum, Chronic Cough, Difficulty Breathing, Snoring and Wheezing. Breast Present- Breast Mass. Not Present- Breast Pain, Nipple Discharge and Skin Changes. Cardiovascular Not Present- Chest Pain, Difficulty Breathing Lying Down, Leg Cramps, Palpitations, Rapid Heart Rate, Shortness of Breath and Swelling of Extremities. Musculoskeletal Not Present- Back Pain, Joint Pain, Joint Stiffness, Muscle Pain, Muscle Weakness and Swelling of Extremities. Neurological Not Present- Decreased Memory, Fainting, Headaches, Numbness, Seizures, Tingling, Tremor, Trouble walking and Weakness. Psychiatric Not Present- Anxiety, Bipolar, Change in Sleep Pattern, Depression, Fearful and Frequent crying.  Vitals (Chemira Jones CMA; 03/06/2020 9:49 AM) 03/06/2020 9:49 AM Weight: 222.6 lb Height: 67in Body Surface Area: 2.12 m Body Mass Index: 34.86 kg/m  Temp.: 98.19F  Pulse: 42 (Regular)  BP: 122/84(Sitting, Left Arm, Standard)       Physical Exam (Donaciano Range A. [name] MD; 03/06/2020 10:17 AM) The physical exam findings are as follows: Note: He appears well on exam.  There is a 3-4 cm mass in the upper-outer quadrant of the right breast. It is slightly mobile. There is no axillary adenopathy. The nipple areolar complex is normal.    Assessment & Plan (Kiasha Bellin A. [name] MD; 03/06/2020 10:19 AM) BREAST CANCER, RIGHT (C50.911) Impression: This is a patient with a right breast cancer. 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 I reviewed the patient's mammogram, ultrasound, and pathology results. The mass measured 3.2 x 3.2 x 2.3 cm. I had a long discussion with the patient and his wife regarding breast cancer. This is a large mass in require a mastectomy to remove. He does not want any neoadjuvant therapy and was to proceed to the operating  room as soon as  possible. I discussed proceeding with a right breast mastectomy and sentinel node biopsy. I discussed a sentinel node biopsy with him and the reasons for looking lymph nodes. I discussed the entire surgical procedure in detail. I discussed the risk of the procedure was includes but is not limited to bleeding, infection, injury to surrounding structures, the need for a drain postoperatively, chronic arm swelling, the need for further surgery if margins or nodes are positive, referral to the cancer center for both medical and radiation oncology. Cardiopulmonary issues, DVT, postoperative recovery, etc. Again, he will most proceed to the operating room as soon as possible. Surgery will be scheduled urgently. This patient encounter took 30 minutes today to perform the following: take history, perform exam, review outside records, interpret imaging, counsel the patient on their diagnosis and document encounter, findings & plan in the EHR

## 2020-03-15 NOTE — Progress Notes (Addendum)
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   HISTORY OF PRESENT ILLNESS: Logan Wright is a 74 y.o. male with a new diagnosis of right male breast cancer. The patient was noted to have a palpable mass in the right breast and further diagnostic imaging revealed a mass at 11:00 measuring 3.2 x 3.2 x 2.3 cm and his right axilla was negative for adenopathy. 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 He is contacted today to discuss treatment recommendations for his cancer. He is planning to undergo surgical resection tomorrow.    PREVIOUS RADIATION THERAPY: No   PAST MEDICAL HISTORY:  Past Medical History:  Diagnosis Date  . CKD (chronic kidney disease), stage III   . Colon adenoma 2015  . Diverticulosis    DENIES   . DJD (degenerative joint disease)   . ED (erectile dysfunction)   . Elevated blood pressure 2007-2008  . Esophageal reflux   . GERD (gastroesophageal reflux disease)   . Gout   . Gynecomastia    LEFT  . Hearing loss   . HTN (hypertension)   . Hypercholesteremia   . Non-cardiac chest pain   . OA (osteoarthritis)    LEFT SHOULDER  . Onychomycosis of foot with other complication   . Pre-diabetes   . Seborrhea   . Severe frontal headaches    RESOLVED   . Vertigo    RESOLVED   . Wears hearing aid        PAST SURGICAL HISTORY: Past Surgical History:  Procedure Laterality Date  . COLONSCOPY     . HIP ARTHROPLASTY Bilateral    AT [facility]   . KNEE ARTHROSCOPY     UNSURE WHICH KNEE   . TOTAL KNEE ARTHROPLASTY Left  11/23/2018   Procedure: TOTAL KNEE ARTHROPLASTY;  Surgeon: Paralee Cancel, MD;  Location: [facility];  Service: Orthopedics;  Laterality: Left;  70 mins     FAMILY HISTORY:  Family History  Problem Relation Age of Onset  . Heart attack Mother   . Stroke Mother   . Heart failure Father        56  . Hypertension Neg Hx      SOCIAL HISTORY:  reports that he quit smoking about 54 years ago. He smoked 0.25 packs per day. He has never used smokeless tobacco. He reports that he does not drink alcohol or use drugs.  The patient is married and lives in [place].  He is accompanied by his wife.   ALLERGIES: Patient has no known allergies.   MEDICATIONS:  Current Outpatient Medications  Medication Sig Dispense Refill  . allopurinol (ZYLOPRIM) 100 MG tablet Take 200 mg by mouth daily.    Marland Kitchen atorvastatin (LIPITOR) 40 MG tablet Take 40 mg by mouth daily.    Marland Kitchen GLUCOSAMINE-CHONDROITIN PO Take 2 tablets by mouth daily.    Marland Kitchen losartan-hydrochlorothiazide (HYZAAR) 50-12.5 MG tablet TAKE 1 TABLET BY MOUTH DAILY. 30 tablet 5  . Multiple Vitamin (MULTIVITAMIN) tablet Take 1 tablet by mouth daily.     Marland Kitchen  Omega-3 Fatty Acids (FISH OIL PO) Take 2,800 mg by mouth daily.     . Potassium 99 MG TABS Take by mouth.     No current facility-administered medications for this encounter.   Facility-Administered Medications Ordered in Other Encounters  Medication Dose Route Frequency Provider Last Rate Last Admin  . Chlorhexidine Gluconate Cloth 2 % PADS 6 each  6 each Topical Once [name], MD       And  . Chlorhexidine Gluconate Cloth 2 % PADS 6 each  6 each Topical Once [name], MD      . feeding supplement (ENSURE PRE-SURGERY) liquid 296 mL  296 mL Oral Once [name], MD         REVIEW OF SYSTEMS: On review of systems, the patient reports that he is doing very well.  No specific complaints are verbalized.     PHYSICAL EXAM:  Wt Readings from Last 3 Encounters:  03/15/20 224 lb  12.8 oz (102 kg)  08/17/19 216 lb (98 kg)  08/10/19 216 lb (98 kg)   Temp Readings from Last 3 Encounters:  03/15/20 (!) 97.2 F (36.2 C)  06/14/19 98.1 F (36.7 C) (Oral)  06/03/19 97.9 F (36.6 C) (Oral)   BP Readings from Last 3 Encounters:  03/15/20 (!) 152/77  08/10/19 130/78  06/14/19 131/76   Pulse Readings from Last 3 Encounters:  03/15/20 73  08/10/19 63  06/14/19 63   In general this is a well appearing Caucasian male in no acute distress.  He's alert and oriented x4 and appropriate throughout the examination. Cardiopulmonary assessment is negative for acute distress and he exhibits normal effort.     ECOG = 1  0 - Asymptomatic (Fully active, able to carry on all predisease activities without restriction)  1 - Symptomatic but completely ambulatory (Restricted in physically strenuous activity but ambulatory and able to carry out work of a light or sedentary nature. For example, light housework, office work)  2 - Symptomatic, <50% in bed during the day (Ambulatory and capable of all self care but unable to carry out any work activities. Up and about more than 50% of waking hours)  3 - Symptomatic, >50% in bed, but not bedbound (Capable of only limited self-care, confined to bed or chair 50% or more of waking hours)  4 - Bedbound (Completely disabled. Cannot carry on any self-care. Totally confined to bed or chair)  5 - Death   Eustace Pen MM, Creech RH, Tormey DC, et al. [phone]). "Toxicity and response criteria of the [facility]". [place] Oncol. 5 (6): 649-55    LABORATORY DATA:  Lab Results  Component Value Date   WBC 16.5 (H) 11/24/2018   HGB 13.8 11/24/2018   HCT 43.2 11/24/2018   MCV 88.7 11/24/2018   PLT 248 11/24/2018   Lab Results  Component Value Date   NA 137 03/13/2020   K 4.3 03/13/2020   CL 101 03/13/2020   CO2 23 03/13/2020   No results found for: ALT, AST, GGT, ALKPHOS, BILITOT    RADIOGRAPHY: US BREAST LTD UNI  RIGHT INC AXILLA  Addendum Date: 02/21/2020   ADDENDUM REPORT: 02/21/2020 17:53 ADDENDUM: The mammogram and portion of the exam title should read as follows: DIGITAL DIAGNOSTIC BILATERAL MAMMOGRAM WITH CAD AND TOMO. Electronically Signed   By: Claudie Revering M.D.   On: 02/21/2020 17:53   Result Date: 02/21/2020 CLINICAL DATA:  Mass felt by the patient in the right breast for the past  4-5 months. EXAM: DIGITAL DIAGNOSTIC RIGHT MAMMOGRAM WITH CAD AND TOMO ULTRASOUND RIGHT BREAST COMPARISON:  None. ACR Breast Density Category b: There are scattered areas of fibroglandular density. FINDINGS: There is an irregular, dense mass in upper outer quadrant of the right breast, corresponding to the mass felt by the patient, marked with a metallic marker. This contains a small amount of punctate peripheral calcifications. No findings on the left suspicious for malignancy. Mammographic images were processed with CAD. On physical exam, the patient has an approximately 4 cm rounded, firm, palpable mass in the anterior right breast in the upper outer quadrant. There are no palpable right axillary lymph nodes. Targeted ultrasound is performed, showing a 3.2 x 3.2 x 2 3 cm irregular, mildly heterogeneous, predominantly hypoechoic mass in the 11 o'clock position of the right breast, 2 cm from the nipple. Ultrasound of the right axilla demonstrated normal right axillary lymph nodes. IMPRESSION: 3.2 cm mass in the 11 o'clock position of the right breast with imaging features highly suspicious for malignancy. RECOMMENDATION: Ultrasound-guided core needle biopsy of the 3.2 cm mass in the 11 o'clock position of the right breast. This has been discussed with the patient and scheduled at 7:30 a.m. on 03/02/2020. I have discussed the findings and recommendations with the patient. If applicable, a reminder letter will be sent to the patient regarding the next appointment. BI-RADS CATEGORY  5: Highly suggestive of malignancy. Electronically  Signed: By: Claudie Revering M.D. On: 02/21/2020 09:55   MM DIAG BREAST TOMO BILATERAL  Addendum Date: 02/21/2020   ADDENDUM REPORT: 02/21/2020 17:53 ADDENDUM: The mammogram and portion of the exam title should read as follows: DIGITAL DIAGNOSTIC BILATERAL MAMMOGRAM WITH CAD AND TOMO. Electronically Signed   By: Claudie Revering M.D.   On: 02/21/2020 17:53   Result Date: 02/21/2020 CLINICAL DATA:  Mass felt by the patient in the right breast for the past 4-5 months. EXAM: DIGITAL DIAGNOSTIC RIGHT MAMMOGRAM WITH CAD AND TOMO ULTRASOUND RIGHT BREAST COMPARISON:  None. ACR Breast Density Category b: There are scattered areas of fibroglandular density. FINDINGS: There is an irregular, dense mass in upper outer quadrant of the right breast, corresponding to the mass felt by the patient, marked with a metallic marker. This contains a small amount of punctate peripheral calcifications. No findings on the left suspicious for malignancy. Mammographic images were processed with CAD. On physical exam, the patient has an approximately 4 cm rounded, firm, palpable mass in the anterior right breast in the upper outer quadrant. There are no palpable right axillary lymph nodes. Targeted ultrasound is performed, showing a 3.2 x 3.2 x 2 3 cm irregular, mildly heterogeneous, predominantly hypoechoic mass in the 11 o'clock position of the right breast, 2 cm from the nipple. Ultrasound of the right axilla demonstrated normal right axillary lymph nodes. IMPRESSION: 3.2 cm mass in the 11 o'clock position of the right breast with imaging features highly suspicious for malignancy. RECOMMENDATION: Ultrasound-guided core needle biopsy of the 3.2 cm mass in the 11 o'clock position of the right breast. This has been discussed with the patient and scheduled at 7:30 a.m. on 03/02/2020. I have discussed the findings and recommendations with the patient. If applicable, a reminder letter will be sent to the patient regarding the next appointment.  BI-RADS CATEGORY  5: Highly suggestive of malignancy. Electronically Signed: By: Claudie Revering M.D. On: 02/21/2020 09:55   MM CLIP PLACEMENT RIGHT  Result Date: 03/02/2020 CLINICAL DATA:  Post biopsy mammogram of the right breast for clip  placement. EXAM: DIAGNOSTIC RIGHT MAMMOGRAM POST ULTRASOUND BIOPSY COMPARISON:  Previous exam(s). FINDINGS: Mammographic images were obtained following ultrasound guided biopsy of a mass in the right breast at 11 o'clock. The biopsy marking clip is in expected position at the site of biopsy. IMPRESSION: Appropriate positioning of the coil shaped biopsy marking clip at the site of biopsy in the upper-outer right breast. Final Assessment: Post Procedure Mammograms for Marker Placement Electronically Signed   By: Ammie Ferrier M.D.   On: 03/02/2020 08:24   [place] RT BREAST BX W LOC DEV 1ST LESION IMG BX SPEC US GUIDE  Addendum Date: 03/05/2020   ADDENDUM REPORT: 03/05/2020 15:00 ADDENDUM: Pathology revealed GRADE II-III INVASIVE DUCTAL CARCINOMA of the Right breast, mass 11 o'clock. This was found to be concordant by Dr. Ammie Ferrier. Pathology results were discussed with the patient by telephone. The patient reported doing well after the biopsy with tenderness at the site. Post biopsy instructions and care were reviewed and questions were answered. The patient was encouraged to call [facility] for any additional concerns. Surgical consultation has been arranged with Dr. Nedra Hai at [facility] on Mar 06, 2020. Pathology results reported by Terie Purser, RN on 03/05/2020. Electronically Signed   By: Ammie Ferrier M.D.   On: 03/05/2020 15:00   Result Date: 03/05/2020 CLINICAL DATA:  74 year old male presenting for ultrasound-guided biopsy of a right breast mass. EXAM: ULTRASOUND GUIDED RIGHT BREAST CORE NEEDLE BIOPSY COMPARISON:  Previous exam(s). PROCEDURE: I met with the patient and we discussed the procedure of  ultrasound-guided biopsy, including benefits and alternatives. We discussed the high likelihood of a successful procedure. We discussed the risks of the procedure, including infection, bleeding, tissue injury, clip migration, and inadequate sampling. Informed written consent was given. The usual time-out protocol was performed immediately prior to the procedure. Lesion quadrant: Upper outer quadrant Using sterile technique and 1% Lidocaine as local anesthetic, under direct ultrasound visualization, a 14 gauge spring-loaded device was used to perform biopsy of a mass in the right breast at 11 o'clock using an inferior approach. At the conclusion of the procedure coil shaped tissue marker clip was deployed into the biopsy cavity. Follow up 2 view mammogram was performed and dictated separately. IMPRESSION: Ultrasound guided biopsy of a right breast mass at 11 o'clock. No apparent complications. Electronically Signed: By: Ammie Ferrier M.D. On: 03/02/2020 08:12       IMPRESSION/PLAN: 1. 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 Dr. Lisbeth Renshaw discusses the pathology findings and reviews the nature of right male breast disease. The consensus from the breast conference includes proceeding with mastectomy and sentinel node biopsy. He would also benefit from tamoxifen. Depending on his nodal status, and size of his final tumor, there may be a role for adjuvant postmastectomy radiotherapy.  We discussed the risks, benefits, short, and long term effects of radiotherapy, and the patient would proceed if indicated by pathology, and if so. Dr. Lisbeth Renshaw would recommend 6 1/2  weeks of radiotherapy. We will review his pathology and see him back as indicated by surgical findings.  He is in agreement with this plan. 2. Possible genetic predisposition to malignancy. The patient is a candidate for genetic testing given his personal and family history. He was offered referral and  is interested in meeting with genetics in a few weeks.   In a visit lasting 60 minutes, greater than 50% of the time was spent face  to face discussing the patient's condition, in preparation for the discussion, and coordinating the patient's care.   The above documentation reflects my direct findings during this shared patient visit. Please see the separate note by Dr. Lisbeth Renshaw on this date for the remainder of the patient's plan of care.    Carola Rhine, PAC

## 2020-03-16 ENCOUNTER — Other Ambulatory Visit: Payer: Self-pay

## 2020-03-16 ENCOUNTER — Encounter (HOSPITAL_BASED_OUTPATIENT_CLINIC_OR_DEPARTMENT_OTHER)
Admission: RE | Disposition: A | Discharge status: Home / Self Care | Payer: Self-pay | Source: Home / Self Care | Attending: Surgery

## 2020-03-16 ENCOUNTER — Ambulatory Visit (HOSPITAL_BASED_OUTPATIENT_CLINIC_OR_DEPARTMENT_OTHER): Payer: PPO | Admitting: Anesthesiology

## 2020-03-16 ENCOUNTER — Encounter (HOSPITAL_BASED_OUTPATIENT_CLINIC_OR_DEPARTMENT_OTHER): Payer: Self-pay | Admitting: Surgery

## 2020-03-16 ENCOUNTER — Ambulatory Visit (HOSPITAL_COMMUNITY)
Admission: RE | Admit: 2020-03-16 | Discharge: 2020-03-16 | Disposition: A | Discharge status: Home / Self Care | Payer: PPO | Source: Ambulatory Visit | Attending: Surgery | Admitting: Surgery

## 2020-03-16 ENCOUNTER — Ambulatory Visit (HOSPITAL_BASED_OUTPATIENT_CLINIC_OR_DEPARTMENT_OTHER)
Admission: RE | Admit: 2020-03-16 | Discharge: 2020-03-17 | Disposition: A | Discharge status: Home / Self Care | Payer: PPO | Attending: Surgery | Admitting: Surgery

## 2020-03-16 DIAGNOSIS — Z170 Estrogen receptor positive status [ER+]: Secondary | ICD-10-CM | POA: Diagnosis not present

## 2020-03-16 DIAGNOSIS — Z79899 Other long term (current) drug therapy: Secondary | ICD-10-CM | POA: Diagnosis not present

## 2020-03-16 DIAGNOSIS — K219 Gastro-esophageal reflux disease without esophagitis: Secondary | ICD-10-CM | POA: Diagnosis not present

## 2020-03-16 DIAGNOSIS — I10 Essential (primary) hypertension: Secondary | ICD-10-CM | POA: Diagnosis not present

## 2020-03-16 DIAGNOSIS — Z9011 Acquired absence of right breast and nipple: Secondary | ICD-10-CM | POA: Diagnosis present

## 2020-03-16 DIAGNOSIS — G8918 Other acute postprocedural pain: Secondary | ICD-10-CM | POA: Diagnosis not present

## 2020-03-16 DIAGNOSIS — C50921 Malignant neoplasm of unspecified site of right male breast: Secondary | ICD-10-CM | POA: Insufficient documentation

## 2020-03-16 DIAGNOSIS — C50421 Malignant neoplasm of upper-outer quadrant of right male breast: Secondary | ICD-10-CM | POA: Diagnosis not present

## 2020-03-16 DIAGNOSIS — Z01812 Encounter for preprocedural laboratory examination: Secondary | ICD-10-CM | POA: Diagnosis not present

## 2020-03-16 DIAGNOSIS — Z853 Personal history of malignant neoplasm of breast: Secondary | ICD-10-CM

## 2020-03-16 DIAGNOSIS — E7800 Pure hypercholesterolemia, unspecified: Secondary | ICD-10-CM | POA: Diagnosis not present

## 2020-03-16 HISTORY — PX: MASTECTOMY: SHX3

## 2020-03-16 HISTORY — PX: MASTECTOMY W/ SENTINEL NODE BIOPSY: SHX2001

## 2020-03-16 SURGERY — MASTECTOMY WITH SENTINEL LYMPH NODE BIOPSY
Anesthesia: General | Site: Breast | Laterality: Right

## 2020-03-16 MED ORDER — ONDANSETRON HCL 4 MG/2ML IJ SOLN: 4.0000 mg | Freq: Four times a day (QID) | INTRAMUSCULAR | Status: DC | PRN

## 2020-03-16 MED ORDER — ROPIVACAINE HCL 5 MG/ML IJ SOLN
INTRAMUSCULAR | Status: DC | PRN
  Administered 2020-03-16: 30 mL

## 2020-03-16 MED ORDER — GABAPENTIN 300 MG PO CAPS
300.0000 mg | ORAL_CAPSULE | ORAL | Status: AC
  Administered 2020-03-16: 300 mg via ORAL

## 2020-03-16 MED ORDER — DIPHENHYDRAMINE HCL 50 MG/ML IJ SOLN: 12.5000 mg | Freq: Four times a day (QID) | INTRAMUSCULAR | Status: DC | PRN

## 2020-03-16 MED ORDER — POTASSIUM CHLORIDE IN NACL 20-0.9 MEQ/L-% IV SOLN: INTRAVENOUS | Status: DC

## 2020-03-16 MED ORDER — CELECOXIB 200 MG PO CAPS
200.0000 mg | ORAL_CAPSULE | Freq: Two times a day (BID) | ORAL | Status: DC
  Administered 2020-03-16: 200 mg via ORAL
  Filled 2020-03-16: qty 1

## 2020-03-16 MED ORDER — LIDOCAINE HCL (CARDIAC) PF 100 MG/5ML IV SOSY
PREFILLED_SYRINGE | INTRAVENOUS | Status: DC | PRN
  Administered 2020-03-16: 30 mg via INTRAVENOUS

## 2020-03-16 MED ORDER — FENTANYL CITRATE (PF) 100 MCG/2ML IJ SOLN
INTRAMUSCULAR | Status: AC
  Filled 2020-03-16: qty 2

## 2020-03-16 MED ORDER — LACTATED RINGERS IV SOLN: INTRAVENOUS | Status: DC

## 2020-03-16 MED ORDER — GABAPENTIN 300 MG PO CAPS
300.0000 mg | ORAL_CAPSULE | Freq: Two times a day (BID) | ORAL | Status: DC
  Administered 2020-03-16: 300 mg via ORAL
  Filled 2020-03-16: qty 1

## 2020-03-16 MED ORDER — CEFAZOLIN SODIUM-DEXTROSE 2-4 GM/100ML-% IV SOLN
INTRAVENOUS | Status: AC
  Filled 2020-03-16: qty 100

## 2020-03-16 MED ORDER — GABAPENTIN 300 MG PO CAPS
ORAL_CAPSULE | ORAL | Status: AC
  Filled 2020-03-16: qty 1

## 2020-03-16 MED ORDER — PROPOFOL 10 MG/ML IV BOLUS
INTRAVENOUS | Status: AC
  Filled 2020-03-16: qty 20

## 2020-03-16 MED ORDER — ACETAMINOPHEN 500 MG PO TABS
ORAL_TABLET | ORAL | Status: AC
  Filled 2020-03-16: qty 2

## 2020-03-16 MED ORDER — HYDROCODONE-ACETAMINOPHEN 5-325 MG PO TABS: 1.0000 | ORAL_TABLET | ORAL | Status: DC | PRN

## 2020-03-16 MED ORDER — ALLOPURINOL 100 MG PO TABS
200.0000 mg | ORAL_TABLET | Freq: Every day | ORAL | Status: DC
  Administered 2020-03-16: 200 mg via ORAL
  Filled 2020-03-16: qty 2

## 2020-03-16 MED ORDER — TRAMADOL HCL 50 MG PO TABS: 50.0000 mg | ORAL_TABLET | Freq: Four times a day (QID) | ORAL | Status: DC | PRN

## 2020-03-16 MED ORDER — LOSARTAN POTASSIUM 50 MG PO TABS
50.0000 mg | ORAL_TABLET | Freq: Every day | ORAL | Status: DC
  Administered 2020-03-16: 50 mg via ORAL
  Filled 2020-03-16: qty 1

## 2020-03-16 MED ORDER — LIDOCAINE 2% (20 MG/ML) 5 ML SYRINGE
INTRAMUSCULAR | Status: AC
  Filled 2020-03-16: qty 5

## 2020-03-16 MED ORDER — ONDANSETRON 4 MG PO TBDP: 4.0000 mg | ORAL_TABLET | Freq: Four times a day (QID) | ORAL | Status: DC | PRN

## 2020-03-16 MED ORDER — PROPOFOL 500 MG/50ML IV EMUL
INTRAVENOUS | Status: DC | PRN
Start: 2020-03-16 — End: 2020-03-16
  Administered 2020-03-16: 25 ug/kg/min via INTRAVENOUS

## 2020-03-16 MED ORDER — 0.9 % SODIUM CHLORIDE (POUR BTL) OPTIME
TOPICAL | Status: DC | PRN
  Administered 2020-03-16: 1000 mL

## 2020-03-16 MED ORDER — MIDAZOLAM HCL 2 MG/2ML IJ SOLN
1.0000 mg | Freq: Once | INTRAMUSCULAR | Status: AC
  Administered 2020-03-16: 1 mg via INTRAVENOUS

## 2020-03-16 MED ORDER — CLONIDINE HCL (ANALGESIA) 100 MCG/ML EP SOLN
EPIDURAL | Status: DC | PRN
  Administered 2020-03-16: 100 ug

## 2020-03-16 MED ORDER — FENTANYL CITRATE (PF) 100 MCG/2ML IJ SOLN: 25.0000 ug | INTRAMUSCULAR | Status: DC | PRN

## 2020-03-16 MED ORDER — OXYCODONE HCL 5 MG PO TABS: 5.0000 mg | ORAL_TABLET | Freq: Once | ORAL | Status: DC | PRN

## 2020-03-16 MED ORDER — HYDROCHLOROTHIAZIDE 12.5 MG PO CAPS
12.5000 mg | ORAL_CAPSULE | Freq: Every day | ORAL | Status: DC
  Administered 2020-03-16: 12.5 mg via ORAL
  Filled 2020-03-16: qty 1

## 2020-03-16 MED ORDER — METHOCARBAMOL 500 MG PO TABS: 500.0000 mg | ORAL_TABLET | Freq: Four times a day (QID) | ORAL | Status: DC | PRN

## 2020-03-16 MED ORDER — MORPHINE SULFATE (PF) 4 MG/ML IV SOLN: 2.0000 mg | INTRAVENOUS | Status: DC | PRN

## 2020-03-16 MED ORDER — FENTANYL CITRATE (PF) 100 MCG/2ML IJ SOLN
INTRAMUSCULAR | Status: DC | PRN
  Administered 2020-03-16: 50 ug via INTRAVENOUS
  Administered 2020-03-16 (×2): 25 ug via INTRAVENOUS

## 2020-03-16 MED ORDER — MIDAZOLAM HCL 2 MG/2ML IJ SOLN
INTRAMUSCULAR | Status: AC
  Filled 2020-03-16: qty 2

## 2020-03-16 MED ORDER — LOSARTAN POTASSIUM-HCTZ 50-12.5 MG PO TABS: 1.0000 | ORAL_TABLET | Freq: Every day | ORAL | Status: DC

## 2020-03-16 MED ORDER — TECHNETIUM TC 99M SULFUR COLLOID FILTERED
1.0000 | Freq: Once | INTRAVENOUS | Status: AC | PRN
  Administered 2020-03-16: 1 via INTRADERMAL

## 2020-03-16 MED ORDER — CEFAZOLIN SODIUM-DEXTROSE 2-4 GM/100ML-% IV SOLN
2.0000 g | INTRAVENOUS | Status: AC
  Administered 2020-03-16: 2 mg via INTRAVENOUS

## 2020-03-16 MED ORDER — PROPOFOL 10 MG/ML IV BOLUS
INTRAVENOUS | Status: DC | PRN
  Administered 2020-03-16: 150 mg via INTRAVENOUS

## 2020-03-16 MED ORDER — DEXAMETHASONE SODIUM PHOSPHATE 4 MG/ML IJ SOLN
INTRAMUSCULAR | Status: DC | PRN
  Administered 2020-03-16: 5 mg via INTRAVENOUS

## 2020-03-16 MED ORDER — DIPHENHYDRAMINE HCL 12.5 MG/5ML PO ELIX: 12.5000 mg | ORAL_SOLUTION | Freq: Four times a day (QID) | ORAL | Status: DC | PRN

## 2020-03-16 MED ORDER — ENOXAPARIN SODIUM 40 MG/0.4ML ~~LOC~~ SOLN: 40.0000 mg | SUBCUTANEOUS | Status: DC

## 2020-03-16 MED ORDER — ATORVASTATIN CALCIUM 40 MG PO TABS
40.0000 mg | ORAL_TABLET | Freq: Every day | ORAL | Status: DC
  Administered 2020-03-16: 40 mg via ORAL
  Filled 2020-03-16: qty 1

## 2020-03-16 MED ORDER — ACETAMINOPHEN 500 MG PO TABS
1000.0000 mg | ORAL_TABLET | ORAL | Status: AC
  Administered 2020-03-16: 1000 mg via ORAL

## 2020-03-16 MED ORDER — OXYCODONE HCL 5 MG/5ML PO SOLN: 5.0000 mg | Freq: Once | ORAL | Status: DC | PRN

## 2020-03-16 MED ORDER — ONDANSETRON HCL 4 MG/2ML IJ SOLN
INTRAMUSCULAR | Status: DC | PRN
  Administered 2020-03-16: 4 mg via INTRAVENOUS

## 2020-03-16 MED ORDER — FENTANYL CITRATE (PF) 100 MCG/2ML IJ SOLN
50.0000 ug | Freq: Once | INTRAMUSCULAR | Status: AC
  Administered 2020-03-16: 50 ug via INTRAVENOUS

## 2020-03-16 SURGICAL SUPPLY — 54 items
ADH SKN CLS APL DERMABOND .7 (GAUZE/BANDAGES/DRESSINGS) ×1
APL PRP STRL LF DISP 70% ISPRP (MISCELLANEOUS) ×1
APPLIER CLIP 9.375 MED OPEN (MISCELLANEOUS)
APR CLP MED 9.3 20 MLT OPN (MISCELLANEOUS)
BLADE CLIPPER SURG (BLADE) IMPLANT
BLADE SURG 10 STRL SS (BLADE) ×1 IMPLANT
BLADE SURG 15 STRL LF DISP TIS (BLADE) ×1 IMPLANT
BLADE SURG 15 STRL SS (BLADE) ×2
CANISTER SUCT 1200ML W/VALVE (MISCELLANEOUS) ×2 IMPLANT
CHLORAPREP W/TINT 26 (MISCELLANEOUS) ×2 IMPLANT
CLIP APPLIE 9.375 MED OPEN (MISCELLANEOUS) IMPLANT
COVER BACK TABLE 60X90IN (DRAPES) ×2 IMPLANT
COVER MAYO STAND STRL (DRAPES) ×2 IMPLANT
COVER PROBE W GEL 5X96 (DRAPES) ×2 IMPLANT
COVER WAND RF STERILE (DRAPES) IMPLANT
DECANTER SPIKE VIAL GLASS SM (MISCELLANEOUS) IMPLANT
DERMABOND ADVANCED (GAUZE/BANDAGES/DRESSINGS) ×1
DERMABOND ADVANCED .7 DNX12 (GAUZE/BANDAGES/DRESSINGS) ×1 IMPLANT
DRAIN CHANNEL 19F RND (DRAIN) ×2 IMPLANT
DRAPE LAPAROSCOPIC ABDOMINAL (DRAPES) ×2 IMPLANT
DRAPE UTILITY XL STRL (DRAPES) ×2 IMPLANT
DRSG PAD ABDOMINAL 8X10 ST (GAUZE/BANDAGES/DRESSINGS) ×2 IMPLANT
ELECT REM PT RETURN 9FT ADLT (ELECTROSURGICAL) ×2
ELECTRODE REM PT RTRN 9FT ADLT (ELECTROSURGICAL) ×1 IMPLANT
EVACUATOR SILICONE 100CC (DRAIN) ×2 IMPLANT
GAUZE SPONGE 4X4 12PLY STRL (GAUZE/BANDAGES/DRESSINGS) ×2 IMPLANT
GAUZE SPONGE 4X4 12PLY STRL LF (GAUZE/BANDAGES/DRESSINGS) IMPLANT
GLOVE SURG SIGNA 7.5 PF LTX (GLOVE) ×2 IMPLANT
GOWN STRL REUS W/ TWL LRG LVL3 (GOWN DISPOSABLE) ×1 IMPLANT
GOWN STRL REUS W/ TWL XL LVL3 (GOWN DISPOSABLE) ×1 IMPLANT
GOWN STRL REUS W/TWL LRG LVL3 (GOWN DISPOSABLE) ×2
GOWN STRL REUS W/TWL XL LVL3 (GOWN DISPOSABLE) ×2
HEMOSTAT SURGICEL 2X14 (HEMOSTASIS) IMPLANT
LIGHT WAVEGUIDE WIDE FLAT (MISCELLANEOUS) ×2 IMPLANT
NDL SAFETY ECLIPSE 18X1.5 (NEEDLE) ×1 IMPLANT
NEEDLE HYPO 18GX1.5 SHARP (NEEDLE) ×2
NEEDLE HYPO 25X1 1.5 SAFETY (NEEDLE) ×4 IMPLANT
NS IRRIG 1000ML POUR BTL (IV SOLUTION) ×2 IMPLANT
PENCIL SMOKE EVACUATOR (MISCELLANEOUS) ×2 IMPLANT
PIN SAFETY STERILE (MISCELLANEOUS) ×2 IMPLANT
SET BASIN DAY SURGERY F.S. (CUSTOM PROCEDURE TRAY) ×2 IMPLANT
SLEEVE SCD COMPRESS KNEE MED (MISCELLANEOUS) ×2 IMPLANT
SPONGE LAP 18X18 RF (DISPOSABLE) ×4 IMPLANT
SUT ETHILON 3 0 PS 1 (SUTURE) ×2 IMPLANT
SUT MNCRL AB 4-0 PS2 18 (SUTURE) ×2 IMPLANT
SUT SILK 2 0 PERMA HAND 18 BK (SUTURE) ×1 IMPLANT
SUT VIC AB 3-0 SH 27 (SUTURE) ×2
SUT VIC AB 3-0 SH 27X BRD (SUTURE) ×1 IMPLANT
SUT VICRYL 3-0 CR8 SH (SUTURE) ×4 IMPLANT
SYR BULB EAR ULCER 3OZ GRN STR (SYRINGE) ×2 IMPLANT
SYR CONTROL 10ML LL (SYRINGE) ×4 IMPLANT
TOWEL GREEN STERILE FF (TOWEL DISPOSABLE) ×2 IMPLANT
TUBE CONNECTING 20X1/4 (TUBING) ×2 IMPLANT
YANKAUER SUCT BULB TIP NO VENT (SUCTIONS) ×2 IMPLANT

## 2020-03-16 NOTE — Interval H&P Note (Signed)
History and Physical Interval Note:no change in H and P  03/16/2020 2:16 PM  Logan Wright  has presented today for surgery, with the diagnosis of RIGHT BREAST CANCER.  The various methods of treatment have been discussed with the patient and family. After consideration of risks, benefits and other options for treatment, the patient has consented to  Procedure(s) with comments: RIGHT BREAST MASTECTOMY WITH SENTINEL LYMPH NODE BIOPSY (Right) - LMA, PEC BLOCK as a surgical intervention.  The patient's history has been reviewed, patient examined, no change in status, stable for surgery.  I have reviewed the patient's chart and labs.  Questions were answered to the patient's satisfaction.     [name]

## 2020-03-16 NOTE — Progress Notes (Signed)
Assisted Dr. Rose with right, ultrasound guided, pectoralis block. Side rails up, monitors on throughout procedure. See vital signs in flow sheet. Tolerated Procedure well. 

## 2020-03-16 NOTE — Anesthesia Preprocedure Evaluation (Signed)
Anesthesia Evaluation  Patient identified by MRN, date of birth, ID band Patient awake    Reviewed: Allergy & Precautions, NPO status , Patient's Chart, lab work & pertinent test results  Airway Mallampati: II  TM Distance: >3 FB Neck ROM: Full    Dental no notable dental hx.    Pulmonary neg pulmonary ROS,    Pulmonary exam normal breath sounds clear to auscultation       Cardiovascular hypertension, Normal cardiovascular exam Rhythm:Regular Rate:Normal     Neuro/Psych negative neurological ROS  negative psych ROS   GI/Hepatic Neg liver ROS, GERD  ,  Endo/Other  negative endocrine ROS  Renal/GU Renal InsufficiencyRenal disease  negative genitourinary   Musculoskeletal negative musculoskeletal ROS (+)   Abdominal   Peds negative pediatric ROS (+)  Hematology negative hematology ROS (+)   Anesthesia Other Findings   Reproductive/Obstetrics negative OB ROS                             Anesthesia Physical Anesthesia Plan  ASA: III  Anesthesia Plan: General   Post-op Pain Management:  Regional for Post-op pain   Induction: Intravenous  PONV Risk Score and Plan: 2 and Ondansetron, Dexamethasone and Treatment may vary due to age or medical condition  Airway Management Planned: LMA  Additional Equipment:   Intra-op Plan:   Post-operative Plan: Extubation in OR  Informed Consent: I have reviewed the patients History and Physical, chart, labs and discussed the procedure including the risks, benefits and alternatives for the proposed anesthesia with the patient or authorized representative who has indicated his/her understanding and acceptance.     Dental advisory given  Plan Discussed with: CRNA and Surgeon  Anesthesia Plan Comments:         Anesthesia Quick Evaluation

## 2020-03-16 NOTE — Transfer of Care (Signed)
Immediate Anesthesia Transfer of Care Note  Patient: Logan Wright  Procedure(s) Performed: RIGHT BREAST MASTECTOMY WITH SENTINEL LYMPH NODE BIOPSY (Right Breast)  Patient Location: PACU  Anesthesia Type:GA combined with regional for post-op pain  Level of Consciousness: drowsy  Airway & Oxygen Therapy: Patient Spontanous Breathing and Patient connected to face mask oxygen  Post-op Assessment: Report given to RN and Post -op Vital signs reviewed and stable  Post vital signs: Reviewed and stable  Last Vitals:  Vitals Value Taken Time  BP 149/81 03/16/20 1645  Temp 36.7 C 03/16/20 1640  Pulse 70 03/16/20 1651  Resp 22 03/16/20 1651  SpO2 98 % 03/16/20 1651  Vitals shown include unvalidated device data.  Last Pain:  Vitals:   03/16/20 1640  TempSrc:   PainSc: Asleep         Complications: No apparent anesthesia complications

## 2020-03-16 NOTE — Anesthesia Postprocedure Evaluation (Signed)
Anesthesia Post Note  Patient: Logan Wright  Procedure(s) Performed: RIGHT BREAST MASTECTOMY WITH SENTINEL LYMPH NODE BIOPSY (Right Breast)     Patient location during evaluation: PACU Anesthesia Type: General Level of consciousness: awake and alert Pain management: pain level controlled Vital Signs Assessment: post-procedure vital signs reviewed and stable Respiratory status: spontaneous breathing, nonlabored ventilation, respiratory function stable and patient connected to nasal cannula oxygen Cardiovascular status: blood pressure returned to baseline and stable Postop Assessment: no apparent nausea or vomiting Anesthetic complications: no    Last Vitals:  Vitals:   03/16/20 1640 03/16/20 1645  BP:  (!) 149/81  Pulse: 76 75  Resp: 20 (!) 27  Temp: 36.7 C   SpO2: 97% 97%    Last Pain:  Vitals:   03/16/20 1640  TempSrc:   PainSc: Asleep                 Lace Chenevert S

## 2020-03-16 NOTE — Anesthesia Procedure Notes (Signed)
Procedure Name: LMA Insertion Date/Time: 03/16/2020 3:34 PM Performed by: Signe Colt, CRNA Pre-anesthesia Checklist: Patient identified, Emergency Drugs available, Suction available and Patient being monitored Patient Re-evaluated:Patient Re-evaluated prior to induction Oxygen Delivery Method: Circle system utilized Preoxygenation: Pre-oxygenation with 100% oxygen Induction Type: IV induction Ventilation: Mask ventilation without difficulty LMA: LMA inserted LMA Size: 4.0 Number of attempts: 1 Airway Equipment and Method: Bite block Placement Confirmation: positive ETCO2 Tube secured with: Tape Dental Injury: Teeth and Oropharynx as per pre-operative assessment

## 2020-03-16 NOTE — Progress Notes (Signed)
Emotional support provided through nuclear medicine breast injections. Vital signs stable. Patient tolerated well.  

## 2020-03-16 NOTE — Anesthesia Procedure Notes (Signed)
Anesthesia Regional Block: Pectoralis block   Pre-Anesthetic Checklist: ,, timeout performed, Correct Patient, Correct Site, Correct Laterality, Correct Procedure, Correct Position, site marked, Risks and benefits discussed,  Surgical consent,  Pre-op evaluation,  At surgeon's request and post-op pain management  Laterality: Right  Prep: chloraprep       Needles:  Injection technique: Single-shot  Needle Type: Echogenic Needle     Needle Length: 9cm      Additional Needles:   Procedures:,,,, ultrasound used (permanent image in chart),,,,  Narrative:  Start time: 03/16/2020 2:43 PM End time: 03/16/2020 2:52 PM Injection made incrementally with aspirations every 5 mL.  Performed by: Personally  Anesthesiologist: Myrtie Soman, MD  Additional Notes: Patient tolerated the procedure well without complications

## 2020-03-16 NOTE — Anesthesia Procedure Notes (Signed)
Anesthesia Procedure Image    

## 2020-03-16 NOTE — Op Note (Signed)
RIGHT BREAST MASTECTOMY WITH SENTINEL LYMPH NODE BIOPSY  Procedure Note  Logan Wright 03/16/2020   Pre-op Diagnosis: RIGHT BREAST CANCER     Post-op Diagnosis: same  Procedure(s): RIGHT BREAST MASTECTOMY WITH DEEP RIGHT AXILLARY SENTINEL LYMPH NODE BIOPSY  Surgeon(s): [name], MD  Anesthesia: General  Staff:  Circulator: Paulette Blanch, RN Scrub Person: Rowe Robert, NT  Estimated Blood Loss: Minimal               Specimens: sent to path  Indications: This is a 74 year old gentleman who presents with a large right breast cancer.  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  After discussion, he wishes to proceed with a right mastectomy and sentinel lymph node biopsy  Procedure: The patient was identified in the preoperative holding area and the radiation technologist injected the radioactive isotope around the nipple areolar complex.  The patient was then taken to the operating room.  He was placed upon the operating table general anesthesia was induced.  His right chest and axilla were then prepped and draped in the usual sterile fashion.  There was a large palpable mass in the upper outer quadrant near the areola.  I made elliptical incision across the chest incorporating the mass in the nipple areolar complex with a scalpel.  I then took this down into the breast tissue circumferentially with the cautery.  I then created superior and inferior skin flaps with the cautery going toward the clavicle and the inframammary ridge and then down to the chest wall circumferentially.  I took this then from medial to lateral toward the axilla.  I then dissected the breast tissue off of the pectoralis muscle with electrocautery dissecting medial to laterally toward the axilla.  After I reached the edge of the pectoralis I entered the axilla.  I then used the neoprobe to identify 2 large deep axillary lymph nodes.  These lymph nodes were excised as the sentinel lymph nodes.  I then evaluated  the nodal basin and no other increased uptake was identified.  There were no other large palpable nodes.  I then completed the mastectomy with the electrocautery.  I then marked the specimen laterally with a silk suture.  I thoroughly irrigated the wound with saline.  Hemostasis appeared to be achieved.  I then made a separate skin incision and placed a 19 [place] Blake drain into the wound.  This was sutured in place with a nylon suture.  I then closed the subcutaneous tissue with interrupted 3-0 Vicryl sutures and closed the skin with a running 4-0 Monocryl.  Dermabond and a breast binder were applied.  The patient tolerated the procedure well.  All the counts were correct at the end of the procedure.  The patient was then extubated in the operating room and taken in a stable condition to the recovery room.          [name]   Date: 03/16/2020  Time: 4:30 PM

## 2020-03-17 MED ORDER — HYDROCODONE-ACETAMINOPHEN 5-325 MG PO TABS: 1.0000 | ORAL_TABLET | ORAL | 0 refills | Status: DC | PRN

## 2020-03-17 NOTE — Discharge Summary (Signed)
Physician Discharge Summary  Patient ID: Logan Wright MRN: [id number] DOB/AGE: 1946-05-02 74 y.o.  Admit date: 03/16/2020 Discharge date: 03/17/2020  Admission Diagnoses:  Discharge Diagnoses:  Active Problems:   S/P mastectomy, right right breast cancer  Discharged Condition: good  Hospital Course: uneventful post op recovery.  Discharged home POD#1  Consults: None  Significant Diagnostic Studies:   Treatments: surgery: right mastectomy with sentinel node biopsy  Discharge Exam: Blood pressure 118/64, pulse (!) 54, temperature (!) 97.1 F (36.2 C), resp. rate 16, height 5\' 7"  (1.702 m), weight 100.2 kg, SpO2 95 %. General appearance: alert, cooperative and no distress Resp: clear to auscultation bilaterally Cardio: regular rate and rhythm, S1, S2 normal, no murmur, click, rub or gallop Incision/Wound:incision clean, drain serosangenous   Disposition: Discharge disposition: 01-Home or Self Care       Discharge Instructions    Discharge patient   Complete by: As directed    Teach drain care, recording output   Discharge disposition: 01-Home or Self Care   Discharge patient date: 03/17/2020     Allergies as of 03/17/2020   No Known Allergies     Medication List    TAKE these medications   allopurinol 100 MG tablet Commonly known as: ZYLOPRIM Take 200 mg by mouth daily.   atorvastatin 40 MG tablet Commonly known as: LIPITOR Take 40 mg by mouth daily.   FISH OIL PO Take 2,800 mg by mouth daily.   GLUCOSAMINE-CHONDROITIN PO Take 2 tablets by mouth daily.   HYDROcodone-acetaminophen 5-325 MG tablet Commonly known as: NORCO/VICODIN Take 1 tablet by mouth every 4 (four) hours as needed for moderate pain.   losartan-hydrochlorothiazide 50-12.5 MG tablet Commonly known as: HYZAAR TAKE 1 TABLET BY MOUTH DAILY.   multivitamin tablet Take 1 tablet by mouth daily.   Potassium 99 MG Tabs Take by mouth.      Follow-up Information    [name],  MD. Schedule an appointment as soon as possible for a visit on 03/29/2020.   Specialty: General Surgery Why: call office and make an appointment to see nurses for drain removal on 6/17 Contact information: [address] [place] [address] [phone]           Signed: [name] 03/17/2020, 6:41 AM

## 2020-03-17 NOTE — Progress Notes (Signed)
Patient ID: Logan Wright, male   DOB: Dec 10, 1945, 74 y.o.   MRN: [id number]   Doing well No complaints Pain controlled Drain serosang  Plan: Discharge home

## 2020-03-17 NOTE — Discharge Instructions (Signed)
[facility] [phone]  MASTECTOMY: POST OP INSTRUCTIONS  Always review your discharge instruction sheet given to you by the facility where your surgery was performed. IF YOU HAVE DISABILITY OR FAMILY LEAVE FORMS, YOU MUST BRING THEM TO THE OFFICE FOR PROCESSING.   DO NOT GIVE THEM TO YOUR DOCTOR. A prescription for pain medication may be given to you upon discharge.  Take your pain medication as prescribed, if needed.  If narcotic pain medicine is not needed, then you may take acetaminophen (Tylenol) or ibuprofen (Advil) as needed. 1. Take your usually prescribed medications unless otherwise directed. 2. If you need a refill on your pain medication, please contact your pharmacy.  They will contact our office to request authorization.  Prescriptions will not be filled after 5pm or on week-ends. 3. You should follow a light diet the first few days after arrival home, such as soup and crackers, etc.  Resume your normal diet the day after surgery. 4. Most patients will experience some swelling and bruising on the chest and underarm.  Ice packs will help.  Swelling and bruising can take several days to resolve.  5. It is common to experience some constipation if taking pain medication after surgery.  Increasing fluid intake and taking a stool softener (such as Colace) will usually help or prevent this problem from occurring.  A mild laxative (Milk of Magnesia or Miralax) should be taken according to package instructions if there are no bowel movements after 48 hours. 6. Unless discharge instructions indicate otherwise, leave your bandage dry and in place until your next appointment in 3-5 days.  You may take a limited sponge bath.  No tube baths or showers until the drains are removed.  You may have steri-strips (small skin tapes) in place directly over the incision.  These strips should be left on the skin for 7-10 days.  If your surgeon used skin glue on the incision, you may  shower in 24 hours.  The glue will flake off over the next 2-3 weeks.  Any sutures or staples will be removed at the office during your follow-up visit. 7. DRAINS:  If you have drains in place, it is important to keep a list of the amount of drainage produced each day in your drains.  Before leaving the hospital, you should be instructed on drain care.  Call our office if you have any questions about your drains. 8. ACTIVITIES:  You may resume regular (light) daily activities beginning the next day--such as daily self-care, walking, climbing stairs--gradually increasing activities as tolerated.  You may have sexual intercourse when it is comfortable.  Refrain from any heavy lifting or straining until approved by your doctor. a. You may drive when you are no longer taking prescription pain medication, you can comfortably wear a seatbelt, and you can safely maneuver your car and apply brakes. b. RETURN TO WORK:  __________________________________________________________ 9. You should see your doctor in the office for a follow-up appointment approximately 3-5 days after your surgery.  Your doctor's nurse will typically make your follow-up appointment when she calls you with your pathology report.  Expect your pathology report 2-3 business days after your surgery.  You may call to check if you do not hear from [place] after three days.   10. OTHER INSTRUCTIONS: OK TO SHOWER TODAY ______________________________________________________________________________________________ ____________________________________________________________________________________________ WHEN TO CALL YOUR DOCTOR: 1. Fever over 101.0 2. Nausea and/or vomiting 3. Extreme swelling or bruising 4. Continued bleeding from incision. 5. Increased pain, redness, or  drainage from the incision. The clinic staff is available to answer your questions during regular business hours.  Please don't hesitate to call and ask to speak to one of the nurses  for clinical concerns.  If you have a medical emergency, go to the nearest emergency room or call 911.  A surgeon from [facility] is always on call at the hospital. [address], [place], [place], [place]  [address] ? [address] [place], [place], [place]   [phone] ? [phone] ? FAX [phone] Web site: [email or web address]

## 2020-03-19 ENCOUNTER — Encounter: Payer: Self-pay | Admitting: *Deleted

## 2020-03-19 LAB — SURGICAL PATHOLOGY

## 2020-03-26 ENCOUNTER — Other Ambulatory Visit: Payer: Self-pay | Admitting: Oncology

## 2020-03-26 DIAGNOSIS — C50421 Malignant neoplasm of upper-outer quadrant of right male breast: Secondary | ICD-10-CM

## 2020-03-26 DIAGNOSIS — Z170 Estrogen receptor positive status [ER+]: Secondary | ICD-10-CM

## 2020-03-26 NOTE — Progress Notes (Signed)
[facility]  [phone] [phone]     ID: ERYC BODEY DOB: [birth year]  MR#: [id number]  VEH#:[id number]  Patient Care Team: [name], MD as PCP - General (Family Medicine) Hunner Garcon, [name], MD as Consulting Physician (Oncology) [name], MD as Consulting Physician (General Surgery) Kyung Rudd, MD as Consulting Physician (Radiation Oncology) Lavonna Monarch, MD as Consulting Physician (Dermatology) Paralee Cancel, MD as Consulting Physician (Orthopedic Surgery) Chauncey Cruel, MD OTHER MD:  CHIEF COMPLAINT: estrogen receptor positive breast cancer  CURRENT TREATMENT: awaiting Oncotype results   HISTORY OF CURRENT ILLNESS: VEER ELAMIN palpated a right breast mass sometime late 2020.  He had a prior left breast mass in the 1990s which was found to be a fat deposit and he assumed this would be the same.  He did mention it to Dr. Jacelyn Grip at the time of their regular physical and he set [name] up for bilateral diagnostic mammography with tomography and right breast ultrasonography at [facility] on 02/21/2020 showing: breast density category B; 3.2 cm irregular mass at 11 o'clock in the right breast; normal right axillary lymph nodes.  Accordingly on 03/02/2020 the patient proceeded to biopsy of the right breast area in question. The pathology from this procedure ([id number]) showed: invasive ductal carcinoma, grade 2-3. Prognostic indicators significant for: estrogen receptor, 90% positive with strong staining intensity and progesterone receptor, 5% positive with moderate staining intensity. Proliferation marker Ki67 at 15%. HER2 equivocal by immunohistochemistry (2+), but negative by fluorescent in situ hybridization with a signals ratio 1.35 and number per cell 3.1.  He met with Dr. Lisbeth Renshaw on 03/16/2020 to discuss adjuvant radiation therapy. Per consultation note,  postmastectomy radiation was felt to be unlikely.  He proceeded to  right mastectomy on 03/16/2020 under Dr. [name]. Pathology from the procedure [id number]) showed: invasive ductal carcinoma, grade 2, 3.2 cm; margins not involved.  All three sentinel lymph nodes removed were negative for carcinoma (0/3).  The patient's subsequent history is as detailed below.   INTERVAL HISTORY: Sayre was evaluated in the breast cancer clinic on 03/27/2020. His case was also presented at the multidisciplinary breast cancer conference on 03/14/2020. At that time a preliminary plan was proposed: Mastectomy, question regarding Oncotype given the patient's age, no adjuvant radiation, tamoxifen   REVIEW OF SYSTEMS: Vadhir still has a drain in place.  And it continues to drain 30 to 40 cc every 9 hours or so.  The drainage is clearing in color but not significantly changing in volume.  He does have pain from this and takes 2 Tylenol at bedtime and then sometimes 2 additional Tylenol in the middle of the night to get through the night.  Otherwise the patient denies unusual headaches, visual changes, nausea, vomiting, stiff neck, dizziness, or gait imbalance. There has been no cough, phlegm production, or pleurisy, no chest pain or pressure, and no change in bowel or bladder habits. The patient denies fever, rash, unexplained fatigue or unexplained weight loss.  He exercises regularly chiefly by walking and doing weights.  A detailed review of systems was otherwise entirely negative.   PAST MEDICAL HISTORY: Past Medical History:  Diagnosis Date   CKD (chronic kidney disease), stage III    Colon adenoma 2015   Diverticulosis    DENIES    DJD (degenerative joint disease)    ED (erectile dysfunction)    Elevated blood pressure 2007-2008   Esophageal reflux    GERD (gastroesophageal reflux disease)  Gout    Gynecomastia    LEFT   Hearing loss    HTN (hypertension)    Hypercholesteremia    Non-cardiac chest pain    OA (osteoarthritis)    LEFT SHOULDER    Onychomycosis of foot with other complication    Pre-diabetes    Seborrhea    Severe frontal headaches    RESOLVED    Vertigo    RESOLVED    Wears hearing aid     PAST SURGICAL HISTORY: Past Surgical History:  Procedure Laterality Date   COLONSCOPY      HIP ARTHROPLASTY Bilateral    AT [place] [facility]    KNEE ARTHROSCOPY     UNSURE WHICH KNEE    MASTECTOMY W/ SENTINEL NODE BIOPSY Right 03/16/2020   Procedure: RIGHT BREAST MASTECTOMY WITH SENTINEL LYMPH NODE BIOPSY;  Surgeon: [name], MD;  Location: [facility];  Service: General;  Laterality: Right;   TOTAL KNEE ARTHROPLASTY Left 11/23/2018   Procedure: TOTAL KNEE ARTHROPLASTY;  Surgeon: Paralee Cancel, MD;  Location: [facility];  Service: Orthopedics;  Laterality: Left;  70 mins    FAMILY HISTORY: Family History  Problem Relation Age of Onset   Heart attack Mother    Stroke Mother    Heart failure Father        57   Hypertension Neg Hx   The patient is of Ashkenazi background.  His father died at age 77 and his mother at age 11.  On the father's side, the paternal grandfather died young from unclear causes.  The paternal grandmother did not have cancer.  The patient's father was a single child.  On the maternal side the patient's maternal grandmother died young again from unclear reasons but the paternal grandfather and the 3 maternal aunts were all died in old age with no cancer.  The patient also knows of no cancer in his cousins.  He himself has 1 brother with no history of cancer, no sisters.   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  Previously he used to on [employer] which she sold in 1999.  He is a IT sales professional.  His wife of 45+ years Ivin Booty is very active in the community.  Their daughter Lowella Curb his Engineer, building services for a [facility] psychiatric group.  She lives  in [place].  Son Annie Main lives in [place] by himself.  Patient has 3 grandchildren.  He attends [employer]    ADVANCED DIRECTIVES: In the absence of any documentation to the contrary, the patient's spouse is his HCPOA.    HEALTH MAINTENANCE: Social History   Tobacco Use   Smoking status: Never Smoker   Smokeless tobacco: Never Used  Substance Use Topics   Alcohol use: No    Alcohol/week: 0.0 standard drinks   Drug use: No     Colonoscopy: [facility]  Bone density: -  PSA:   No Known Allergies  Current Outpatient Medications  Medication Sig Dispense Refill   allopurinol (ZYLOPRIM) 100 MG tablet Take 200 mg by mouth daily.     atorvastatin (LIPITOR) 40 MG tablet Take 40 mg by mouth daily.     GLUCOSAMINE-CHONDROITIN PO Take 2 tablets by mouth daily.     losartan-hydrochlorothiazide (HYZAAR) 50-12.5 MG tablet TAKE 1 TABLET BY MOUTH DAILY. 30 tablet 5   Multiple Vitamin (MULTIVITAMIN) tablet Take 1 tablet by mouth daily.  Omega-3 Fatty Acids (FISH OIL PO) Take 2,800 mg by mouth daily.      Potassium 99 MG TABS Take by mouth.     HYDROcodone-acetaminophen (NORCO/VICODIN) 5-325 MG tablet Take 1 tablet by mouth every 4 (four) hours as needed for moderate pain. (Patient not taking: Reported on 03/27/2020) 25 tablet 0   No current facility-administered medications for this visit.    OBJECTIVE: White man in no acute distress  Vitals:   03/27/20 1552  BP: (!) 150/69  Pulse: 66  Resp: 17  Temp: 98.7 F (37.1 C)  SpO2: 98%     Body mass index is 34.74 kg/m.   Wt Readings from Last 3 Encounters:  03/27/20 221 lb 12.8 oz (100.6 kg)  03/16/20 220 lb 14.4 oz (100.2 kg)  03/15/20 224 lb 12.8 oz (102 kg)      ECOG FS:1 - Symptomatic but completely ambulatory  Ocular: Sclerae unicteric, pupils round and equal Ear-nose-throat: Wearing a mask Lymphatic: No cervical or supraclavicular adenopathy Lungs no rales or rhonchi Heart regular rate and  rhythm Abd soft, nontender, positive bowel sounds MSK no focal spinal tenderness, no joint edema Neuro: non-focal, well-oriented, appropriate affect Breasts: The right breast is status post mastectomy.  The incision is healing nicely, with no dehiscence, erythema or swelling.  There is a drain in place with about 40 cc serosanguineous fluid in the bulb.  There is mild erythema at the exit site of the drainage tube.  The left breast is benign.  Both axillae are benign   LAB RESULTS:  CMP     Component Value Date/Time   NA 142 03/27/2020 1538   K 4.1 03/27/2020 1538   CL 107 03/27/2020 1538   CO2 24 03/27/2020 1538   GLUCOSE 119 (H) 03/27/2020 1538   BUN 21 03/27/2020 1538   CREATININE 1.24 03/27/2020 1538   CREATININE 1.47 (H) 08/24/2015 1257   CALCIUM 9.1 03/27/2020 1538   PROT 6.7 03/27/2020 1538   ALBUMIN 3.1 (L) 03/27/2020 1538   AST 23 03/27/2020 1538   ALT 28 03/27/2020 1538   ALKPHOS 80 03/27/2020 1538   BILITOT 0.5 03/27/2020 1538   GFRNONAA 57 (L) 03/27/2020 1538   GFRAA >60 03/27/2020 1538    No results found for: TOTALPROTELP, ALBUMINELP, A1GS, A2GS, BETS, BETA2SER, GAMS, MSPIKE, SPEI  Lab Results  Component Value Date   WBC 10.7 (H) 03/27/2020   NEUTROABS 6.3 03/27/2020   HGB 14.1 03/27/2020   HCT 43.3 03/27/2020   MCV 85.2 03/27/2020   PLT 293 03/27/2020    No results found for: LABCA2  No components found for: ZDGLOV564  No results for input(s): INR in the last 168 hours.  No results found for: LABCA2  No results found for: PPI951  No results found for: OAC166  No results found for: AYT016  No results found for: CA2729  No components found for: HGQUANT  No results found for: CEA1 / No results found for: CEA1   No results found for: AFPTUMOR  No results found for: CHROMOGRNA  No results found for: KPAFRELGTCHN, LAMBDASER, KAPLAMBRATIO (kappa/lambda light chains)  No results found for: HGBA, HGBA2QUANT, HGBFQUANT,  HGBSQUAN (Hemoglobinopathy evaluation)   No results found for: LDH  No results found for: IRON, TIBC, IRONPCTSAT (Iron and TIBC)  No results found for: FERRITIN  Urinalysis No results found for: COLORURINE, APPEARANCEUR, LABSPEC, PHURINE, GLUCOSEU, HGBUR, BILIRUBINUR, KETONESUR, PROTEINUR, UROBILINOGEN, NITRITE, LEUKOCYTESUR   STUDIES: NM Sentinel Node Inj-No Rpt (Breast)  Result Date: 03/16/2020 Sulfur  colloid was injected by the nuclear medicine technologist for melanoma sentinel node.   MM CLIP PLACEMENT RIGHT  Result Date: 03/02/2020 CLINICAL DATA:  Post biopsy mammogram of the right breast for clip placement. EXAM: DIAGNOSTIC RIGHT MAMMOGRAM POST ULTRASOUND BIOPSY COMPARISON:  Previous exam(s). FINDINGS: Mammographic images were obtained following ultrasound guided biopsy of a mass in the right breast at 11 o'clock. The biopsy marking clip is in expected position at the site of biopsy. IMPRESSION: Appropriate positioning of the coil shaped biopsy marking clip at the site of biopsy in the upper-outer right breast. Final Assessment: Post Procedure Mammograms for Marker Placement Electronically Signed   By: Ammie Ferrier M.D.   On: 03/02/2020 08:24   [place] RT BREAST BX W LOC DEV 1ST LESION IMG BX SPEC US GUIDE  Addendum Date: 03/05/2020   ADDENDUM REPORT: 03/05/2020 15:00 ADDENDUM: Pathology revealed GRADE II-III INVASIVE DUCTAL CARCINOMA of the Right breast, mass 11 o'clock. This was found to be concordant by Dr. Ammie Ferrier. Pathology results were discussed with the patient by telephone. The patient reported doing well after the biopsy with tenderness at the site. Post biopsy instructions and care were reviewed and questions were answered. The patient was encouraged to call [facility] for any additional concerns. Surgical consultation has been arranged with Dr. Nedra Hai at [facility] on Mar 06, 2020. Pathology results reported by Terie Purser, RN on 03/05/2020. Electronically Signed   By: Ammie Ferrier M.D.   On: 03/05/2020 15:00   Result Date: 03/05/2020 CLINICAL DATA:  74 year old male presenting for ultrasound-guided biopsy of a right breast mass. EXAM: ULTRASOUND GUIDED RIGHT BREAST CORE NEEDLE BIOPSY COMPARISON:  Previous exam(s). PROCEDURE: I met with the patient and we discussed the procedure of ultrasound-guided biopsy, including benefits and alternatives. We discussed the high likelihood of a successful procedure. We discussed the risks of the procedure, including infection, bleeding, tissue injury, clip migration, and inadequate sampling. Informed written consent was given. The usual time-out protocol was performed immediately prior to the procedure. Lesion quadrant: Upper outer quadrant Using sterile technique and 1% Lidocaine as local anesthetic, under direct ultrasound visualization, a 14 gauge spring-loaded device was used to perform biopsy of a mass in the right breast at 11 o'clock using an inferior approach. At the conclusion of the procedure coil shaped tissue marker clip was deployed into the biopsy cavity. Follow up 2 view mammogram was performed and dictated separately. IMPRESSION: Ultrasound guided biopsy of a right breast mass at 11 o'clock. No apparent complications. Electronically Signed: By: Ammie Ferrier M.D. On: 03/02/2020 08:12     ELIGIBLE FOR AVAILABLE RESEARCH PROTOCOL: no  ASSESSMENT: 74 y.o. [place] man status post right breast upper outer quadrant lumpectomy 03/02/2020 for a pT2 pN0, stage IIA invasive ductal carcinoma, grade 2, with negative margins; estrogen receptor strongly positive, progesterone receptor moderately positive, HER-2 negative by FISH, MIB-1 of 15%  (a) a total of 3 sentinel lymph nodes were removed  (1) Oncotype to be obtained from the definitive surgical sample  (2) if the patient Oncotype is high and he agrees to chemotherapy consider Cytoxan/Taxotere versus CMF  (3)  tamoxifen to start at the completion of local treatment.  PLAN: I met today with Draedyn to review his new diagnosis. Specifically we discussed the biology of his breast cancer, its diagnosis, staging, treatment  options and prognosis. We first reviewed the fact that cancer is not one disease but more than 100 different diseases and that it  is important to keep them separate-- otherwise when friends and relatives discuss their own cancer experiences with Cristhian confusion can result. Similarly we explained that if breast cancer spreads to the bone or liver, the patient would not have bone cancer or liver cancer, but breast cancer in the bone and breast cancer in the liver: one cancer in three places-- not 3 different cancers which otherwise would have to be treated in 3 different ways.  We discussed the difference between local and systemic therapy.  He has completed local treatment, with successful surgery and no need for adjuvant radiation.  We then discussed the rationale for systemic therapy. There is some risk that this cancer may have already spread to other parts of her body. Patients frequently ask at this point about bone scans, CAT scans and PET scans to find out if they have occult breast cancer somewhere else. The problem is that in early stage disease we are much more likely to find false positives then true cancers and this would expose the patient to unnecessary procedures as well as unnecessary radiation. Scans cannot answer the question the patient really would like to know, which is whether she has microscopic disease elsewhere in her body. For those reasons we do not recommend them.  Of course we would proceed to aggressive evaluation of any symptoms that might suggest metastatic disease, but that is not the case here.  Next we went over the options for systemic therapy which are anti-estrogens, anti-HER-2 immunotherapy, and chemotherapy. Saeed does not meet criteria for anti-HER-2  immunotherapy.  He is a good candidate for anti-estrogens.  The question of chemotherapy is more complicated. Chemotherapy is most effective in rapidly growing, aggressive tumors. It is not at all effective in indolent, slow-growing tumors.  Demetrious situation is somewhere in the middle.  In these cases we generally send an Oncotype and we discussed that extensively today.  He understands that the purpose of the test is to determine whether he would benefit from chemotherapy or not and therefore if he was adamantly against chemotherapy there would be no point sending the test.   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  Accordingly we are requesting an Oncotype test to be run on the surgical specimen and I will call him with those results in approximately 2 weeks.  We discussed pain issues and I gave him some information regarding that.  Hopefully he will be able to get the drain removed soon and that will take care of the pain problem.  We discussed the fact that he needs to avoid using the right arm for blood draws or blood pressures if the left arm is available.  Finally he qualifies for genetics testing.  He may carry an Ashkenazi founder mutation, or perhaps a new mutation since there is really no significant family history of cancer to his knowledge.  He is agreeable to this and I have put in the referral to our genetic counselors.  We did discuss tamoxifen to some extent today and he understands he will certainly benefit from 5 years on that medication.  I also reassured him that stage I patients like himself have an excellent prognosis with standard of care.  Jeryn has a good understanding of the overall plan. He agrees with it. He knows the goal of treatment in his case  is cure. He will call with any problems that may develop before his next  visit here.  Total encounter time 65 minutes.Sarajane Jews C. Lynix Bonine, MD 03/27/2020 5:13 PM Medical Oncology and Hematology [facility] [place], [place] [address] Tel. [phone]    Fax. [phone]   This document serves as a record of services personally performed by Lurline Del, MD. It was created on his behalf by Wilburn Mylar, a trained medical scribe. The creation of this record is based on the scribe's personal observations and the provider's statements to them.   I, Lurline Del MD, have reviewed the above documentation for accuracy and completeness, and I agree with the above.    *Total Encounter Time as defined by the Centers for Medicare and Medicaid Services includes, in addition to the face-to-face time of a patient visit (documented in the note above) non-face-to-face time: obtaining and reviewing outside history, ordering and reviewing medications, tests or procedures, care coordination (communications with other health care professionals or caregivers) and documentation in the medical record.

## 2020-03-27 ENCOUNTER — Other Ambulatory Visit: Payer: Self-pay

## 2020-03-27 ENCOUNTER — Inpatient Hospital Stay: Payer: PPO | Attending: Oncology

## 2020-03-27 ENCOUNTER — Inpatient Hospital Stay (HOSPITAL_BASED_OUTPATIENT_CLINIC_OR_DEPARTMENT_OTHER): Payer: PPO | Admitting: Oncology

## 2020-03-27 ENCOUNTER — Ambulatory Visit: Payer: PPO | Admitting: Hematology and Oncology

## 2020-03-27 VITALS — BP 150/69 | HR 66 | Temp 98.7°F | Resp 17 | Ht 67.0 in | Wt 221.8 lb

## 2020-03-27 DIAGNOSIS — Z9011 Acquired absence of right breast and nipple: Secondary | ICD-10-CM

## 2020-03-27 DIAGNOSIS — I129 Hypertensive chronic kidney disease with stage 1 through stage 4 chronic kidney disease, or unspecified chronic kidney disease: Secondary | ICD-10-CM

## 2020-03-27 DIAGNOSIS — Z170 Estrogen receptor positive status [ER+]: Secondary | ICD-10-CM | POA: Insufficient documentation

## 2020-03-27 DIAGNOSIS — Z79899 Other long term (current) drug therapy: Secondary | ICD-10-CM

## 2020-03-27 DIAGNOSIS — N183 Chronic kidney disease, stage 3 unspecified: Secondary | ICD-10-CM

## 2020-03-27 DIAGNOSIS — M109 Gout, unspecified: Secondary | ICD-10-CM | POA: Insufficient documentation

## 2020-03-27 DIAGNOSIS — C50421 Malignant neoplasm of upper-outer quadrant of right male breast: Secondary | ICD-10-CM | POA: Insufficient documentation

## 2020-03-27 LAB — CBC WITH DIFFERENTIAL/PLATELET
Abs Immature Granulocytes: 0.07 10*3/uL (ref 0.00–0.07)
Basophils Absolute: 0.1 10*3/uL (ref 0.0–0.1)
Basophils Relative: 1 %
Eosinophils Absolute: 0.3 10*3/uL (ref 0.0–0.5)
Eosinophils Relative: 3 %
HCT: 43.3 % (ref 39.0–52.0)
Hemoglobin: 14.1 g/dL (ref 13.0–17.0)
Immature Granulocytes: 1 %
Lymphocytes Relative: 26 %
Lymphs Abs: 2.8 10*3/uL (ref 0.7–4.0)
MCH: 27.8 pg (ref 26.0–34.0)
MCHC: 32.6 g/dL (ref 30.0–36.0)
MCV: 85.2 fL (ref 80.0–100.0)
Monocytes Absolute: 1.3 10*3/uL — ABNORMAL HIGH (ref 0.1–1.0)
Monocytes Relative: 12 %
Neutro Abs: 6.3 10*3/uL (ref 1.7–7.7)
Neutrophils Relative %: 57 %
Platelets: 293 10*3/uL (ref 150–400)
RBC: 5.08 MIL/uL (ref 4.22–5.81)
RDW: 13.6 % (ref 11.5–15.5)
WBC: 10.7 10*3/uL — ABNORMAL HIGH (ref 4.0–10.5)
nRBC: 0 % (ref 0.0–0.2)

## 2020-03-27 LAB — COMPREHENSIVE METABOLIC PANEL
ALT: 28 U/L (ref 0–44)
AST: 23 U/L (ref 15–41)
Albumin: 3.1 g/dL — ABNORMAL LOW (ref 3.5–5.0)
Alkaline Phosphatase: 80 U/L (ref 38–126)
Anion gap: 11 (ref 5–15)
BUN: 21 mg/dL (ref 8–23)
CO2: 24 mmol/L (ref 22–32)
Calcium: 9.1 mg/dL (ref 8.9–10.3)
Chloride: 107 mmol/L (ref 98–111)
Creatinine, Ser: 1.24 mg/dL (ref 0.61–1.24)
GFR calc Af Amer: >60 mL/min (ref >60)
GFR calc non Af Amer: 57 mL/min — ABNORMAL LOW (ref >60)
Glucose, Bld: 119 mg/dL — ABNORMAL HIGH (ref 70–99)
Potassium: 4.1 mmol/L (ref 3.5–5.1)
Sodium: 142 mmol/L (ref 135–145)
Total Bilirubin: 0.5 mg/dL (ref 0.3–1.2)
Total Protein: 6.7 g/dL (ref 6.5–8.1)

## 2020-03-28 ENCOUNTER — Telehealth: Payer: Self-pay | Admitting: *Deleted

## 2020-03-28 ENCOUNTER — Encounter: Payer: Self-pay | Admitting: *Deleted

## 2020-03-28 NOTE — Telephone Encounter (Signed)
Received order for oncotype testing. Requisition faxed to pathology and [facility] °

## 2020-03-29 ENCOUNTER — Telehealth: Payer: Self-pay | Admitting: Oncology

## 2020-03-29 NOTE — Telephone Encounter (Signed)
Scheduled appts per 6/16 los. Left voicemail with appt dates and times.

## 2020-03-30 ENCOUNTER — Telehealth: Payer: Self-pay | Admitting: Licensed Clinical Social Worker

## 2020-03-30 NOTE — Telephone Encounter (Signed)
[place] [employer]  [employer] representative attempted to reach patient by phone  to offer support and assess for needs for newly diagnosed breast cancer patient. No answer, left VM with direct contact information.        Violeta Lecount, [place], [place] Worker [facility]

## 2020-04-02 ENCOUNTER — Telehealth: Payer: Self-pay | Admitting: *Deleted

## 2020-04-02 NOTE — Telephone Encounter (Signed)
This RN returned call per VM left by the patient - obtained VM- message left to call again and if urgent to request to speak to the triage nurse or leave more detailed message.

## 2020-04-03 DIAGNOSIS — Z170 Estrogen receptor positive status [ER+]: Secondary | ICD-10-CM | POA: Diagnosis not present

## 2020-04-03 DIAGNOSIS — C50421 Malignant neoplasm of upper-outer quadrant of right male breast: Secondary | ICD-10-CM | POA: Diagnosis not present

## 2020-04-04 DIAGNOSIS — E782 Mixed hyperlipidemia: Secondary | ICD-10-CM | POA: Diagnosis not present

## 2020-04-04 DIAGNOSIS — M1610 Unilateral primary osteoarthritis, unspecified hip: Secondary | ICD-10-CM | POA: Diagnosis not present

## 2020-04-04 DIAGNOSIS — C50921 Malignant neoplasm of unspecified site of right male breast: Secondary | ICD-10-CM | POA: Diagnosis not present

## 2020-04-04 DIAGNOSIS — N183 Chronic kidney disease, stage 3 unspecified: Secondary | ICD-10-CM | POA: Diagnosis not present

## 2020-04-04 DIAGNOSIS — M1710 Unilateral primary osteoarthritis, unspecified knee: Secondary | ICD-10-CM | POA: Diagnosis not present

## 2020-04-04 DIAGNOSIS — I10 Essential (primary) hypertension: Secondary | ICD-10-CM | POA: Diagnosis not present

## 2020-04-05 ENCOUNTER — Other Ambulatory Visit: Payer: Self-pay | Admitting: Oncology

## 2020-04-05 NOTE — Progress Notes (Signed)
I called Barrywhich show that he will benefit greatly from chemotherapy and has a significant risk of stage IV disease without it.  He and his wife will see me on July 9 to discuss chemotherapy options.  If he does opt for chemotherapy of course he will need a port

## 2020-04-10 ENCOUNTER — Encounter: Payer: Self-pay | Admitting: *Deleted

## 2020-04-10 ENCOUNTER — Telehealth: Payer: Self-pay | Admitting: *Deleted

## 2020-04-10 NOTE — Telephone Encounter (Signed)
Oncotype results 40/28%.  Patient aware. Will see Dr. Jana Hakim 7/9 to discuss.

## 2020-04-19 NOTE — Progress Notes (Signed)
[facility]  [phone] [phone]     ID: Logan Wright DOB: October 11, 1946  MR#: [id number]  PIR#:[id number]  Patient Care Team: Logan Shanks, MD as PCP - General (Family Medicine) Logan Wright, [name], MD as Consulting Physician (Oncology) [name], MD as Consulting Physician (General Surgery) Kyung Rudd, MD as Consulting Physician (Radiation Oncology) Lavonna Monarch, MD as Consulting Physician (Dermatology) Paralee Cancel, MD as Consulting Physician (Orthopedic Surgery) Mauro Kaufmann, RN as Oncology Nurse Navigator Logan Germany, RN as Oncology Nurse Navigator Logan Cruel, MD OTHER MD:  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 He was evaluated in the breast cancer clinic on 03/27/2020.   Since his last visit, Oncotype DX was obtained on the final surgical sample and the recurrence score of 40 predicts a risk of recurrence outside the breast over the next 9 years of 28%, if the patient's only systemic therapy is an antiestrogen for 5 years.  It also predicts a significant benefit from chemotherapy.  He returns today to discuss these results.   REVIEW OF SYSTEMS: Logan Wright did very well with his surgery, without significant problems with pain bleeding or fever.  He walks at least 45 to 60 minutes most mornings.  He has not yet had genetics testing but this is scheduled for April 30, 2020.  He has not been scheduled for physical therapy yet.  A detailed review of systems today was otherwise stable.   HISTORY OF CURRENT ILLNESS: From the original intake note:  Logan Wright palpated a right breast mass sometime late 2020.  He had a prior left breast mass in the 1990s which was found to be a fat deposit and he assumed this would be the same.   He did mention it to Dr. Jacelyn Grip at the time of their regular physical and he set Logan Wright up for bilateral diagnostic mammography with tomography and right breast ultrasonography at [facility] on 02/21/2020 showing: breast density category B; 3.2 cm irregular mass at 11 o'clock in the right breast; normal right axillary lymph nodes.  Accordingly on 03/02/2020 the patient proceeded to biopsy of the right breast area in question. The pathology from this procedure ([id number]) showed: invasive ductal carcinoma, grade 2-3. Prognostic indicators significant for: estrogen receptor, 90% positive with strong staining intensity and progesterone receptor, 5% positive with moderate staining intensity. Proliferation marker Ki67 at 15%. HER2 equivocal by immunohistochemistry (2+), but negative by fluorescent in situ hybridization with a signals ratio 1.35 and number per cell 3.1.  He met with Dr. Lisbeth Wright on 03/16/2020 to discuss adjuvant radiation therapy. Per consultation note,  postmastectomy radiation was felt to be unlikely.  He proceeded to right mastectomy on 03/16/2020 under Dr. [name]. Pathology from the procedure [id number]) showed: invasive ductal carcinoma, grade 2, 3.2 cm; margins not involved.  All three sentinel lymph nodes removed were negative for carcinoma (0/3).  The patient's subsequent history is as detailed below.   PAST MEDICAL HISTORY: Past Medical History:  Diagnosis Date  . CKD (chronic kidney disease), stage III   . Colon adenoma 2015  . Diverticulosis    DENIES   . DJD (degenerative joint disease)   . ED (erectile dysfunction)   . Elevated blood pressure 2007-2008  . Esophageal reflux   . GERD (gastroesophageal reflux disease)   . Gout   .  Gynecomastia    LEFT  . Hearing loss   . HTN (hypertension)   . Hypercholesteremia   . Non-cardiac chest pain   . OA (osteoarthritis)    LEFT SHOULDER  . Onychomycosis of foot with other complication   . Pre-diabetes   . Seborrhea    . Severe frontal headaches    RESOLVED   . Vertigo    RESOLVED   . Wears hearing aid     PAST SURGICAL HISTORY: Past Surgical History:  Procedure Laterality Date  . COLONSCOPY     . HIP ARTHROPLASTY Bilateral    AT [place]   . KNEE ARTHROSCOPY     UNSURE WHICH KNEE   . MASTECTOMY W/ SENTINEL NODE BIOPSY Right 03/16/2020   Procedure: RIGHT BREAST MASTECTOMY WITH SENTINEL LYMPH NODE BIOPSY;  Surgeon: [name], MD;  Location: [facility];  Service: General;  Laterality: Right;  . TOTAL KNEE ARTHROPLASTY Left 11/23/2018   Procedure: TOTAL KNEE ARTHROPLASTY;  Surgeon: Paralee Cancel, MD;  Location: [facility];  Service: Orthopedics;  Laterality: Left;  70 mins    FAMILY HISTORY: Family History  Problem Relation Age of Onset  . Heart attack Mother   . Stroke Mother   . Heart failure Father        22  . Hypertension Neg Hx   The patient is of Ashkenazi background.  His father died at age 95 and his mother at age 34.  On the father's side, the paternal grandfather died young from unclear causes.  The paternal grandmother did not have cancer.  The patient's father was a single child.  On the maternal side the patient's maternal grandmother died young again from unclear reasons but the paternal grandfather and the 3 maternal aunts were all died in old age with no cancer.  The patient also knows of no cancer in his cousins.  He himself has 1 brother with no history of cancer, no sisters.   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  Previously he used to on [employer] which she sold in 1999.  He is a IT sales professional.  His wife of 25+ years Logan Wright is very active in the community.  Their daughter Logan Wright his Engineer, building services for a [facility] psychiatric group.  She lives in [place].  Son Logan Wright lives in [place] by himself.  Patient has 3  grandchildren.  He attends [employer]    ADVANCED DIRECTIVES: In the absence of any documentation to the contrary, the patient's spouse is his HCPOA.    HEALTH MAINTENANCE: Social History   Tobacco Use  . Smoking status: Never Smoker  . Smokeless tobacco: Never Used  Substance Use Topics  . Alcohol use: No    Alcohol/week: 0.0 standard drinks  . Drug use: No     Colonoscopy: [facility]  Bone density: -  PSA:   No Known Allergies  Current Outpatient Medications  Medication Sig Dispense Refill  . allopurinol (ZYLOPRIM) 100 MG tablet Take 200 mg by mouth daily.    Marland Kitchen atorvastatin (LIPITOR) 40 MG tablet Take 40 mg by mouth daily.    Marland Kitchen GLUCOSAMINE-CHONDROITIN PO Take 2 tablets by mouth daily.    Marland Kitchen HYDROcodone-acetaminophen (NORCO/VICODIN) 5-325 MG tablet Take 1 tablet by mouth every 4 (four) hours as needed for moderate pain. (Patient not taking: Reported on 03/27/2020) 25 tablet 0  . losartan-hydrochlorothiazide (HYZAAR)  50-12.5 MG tablet TAKE 1 TABLET BY MOUTH DAILY. 30 tablet 5  . Multiple Vitamin (MULTIVITAMIN) tablet Take 1 tablet by mouth daily.     . Omega-3 Fatty Acids (FISH OIL PO) Take 2,800 mg by mouth daily.     . Potassium 99 MG TABS Take by mouth.     No current facility-administered medications for this visit.    OBJECTIVE: White man in no acute distress  Vitals:   04/20/20 1500  BP: 137/82  Pulse: (!) 59  Resp: 18  Temp: 98.9 F (37.2 C)  SpO2: 98%     Body mass index is 35.08 kg/m.   Wt Readings from Last 3 Encounters:  04/20/20 224 lb (101.6 kg)  03/27/20 221 lb 12.8 oz (100.6 kg)  03/16/20 220 lb 14.4 oz (100.2 kg)      ECOG FS:1 - Symptomatic but completely ambulatory  Sclerae unicteric, EOMs intact Wearing a mask No cervical or supraclavicular adenopathy Lungs no rales or rhonchi Heart regular rate and rhythm Abd soft, nontender, positive bowel sounds MSK no focal spinal tenderness, no upper extremity lymphedema Neuro: nonfocal, well  oriented, appropriate affect Breasts: The right breast is status post mastectomy.  The incision is healing nicely.  There is no dehiscence, erythema, or swelling.  Left breast shows no suspicious masses.  Both axillae are benign.   LAB RESULTS:  CMP     Component Value Date/Time   NA 142 03/27/2020 1538   K 4.1 03/27/2020 1538   CL 107 03/27/2020 1538   CO2 24 03/27/2020 1538   GLUCOSE 119 (H) 03/27/2020 1538   BUN 21 03/27/2020 1538   CREATININE 1.24 03/27/2020 1538   CREATININE 1.47 (H) 08/24/2015 1257   CALCIUM 9.1 03/27/2020 1538   PROT 6.7 03/27/2020 1538   ALBUMIN 3.1 (L) 03/27/2020 1538   AST 23 03/27/2020 1538   ALT 28 03/27/2020 1538   ALKPHOS 80 03/27/2020 1538   BILITOT 0.5 03/27/2020 1538   GFRNONAA 57 (L) 03/27/2020 1538   GFRAA >60 03/27/2020 1538    No results found for: TOTALPROTELP, ALBUMINELP, A1GS, A2GS, BETS, BETA2SER, GAMS, MSPIKE, SPEI  Lab Results  Component Value Date   WBC 10.7 (H) 03/27/2020   NEUTROABS 6.3 03/27/2020   HGB 14.1 03/27/2020   HCT 43.3 03/27/2020   MCV 85.2 03/27/2020   PLT 293 03/27/2020    No results found for: LABCA2  No components found for: RDEYCX448  No results for input(s): INR in the last 168 hours.  No results found for: LABCA2  No results found for: JEH631  No results found for: SHF026  No results found for: VZC588  No results found for: CA2729  No components found for: HGQUANT  No results found for: CEA1 / No results found for: CEA1   No results found for: AFPTUMOR  No results found for: CHROMOGRNA  No results found for: KPAFRELGTCHN, LAMBDASER, KAPLAMBRATIO (kappa/lambda light chains)  No results found for: HGBA, HGBA2QUANT, HGBFQUANT, HGBSQUAN (Hemoglobinopathy evaluation)   No results found for: LDH  No results found for: IRON, TIBC, IRONPCTSAT (Iron and TIBC)  No results found for: FERRITIN  Urinalysis No results found for: COLORURINE, APPEARANCEUR, LABSPEC, PHURINE, GLUCOSEU, HGBUR,  BILIRUBINUR, KETONESUR, PROTEINUR, UROBILINOGEN, NITRITE, LEUKOCYTESUR   STUDIES: No results found.   ELIGIBLE FOR AVAILABLE RESEARCH PROTOCOL: no  ASSESSMENT: 74 y.o. [place] man status post right breast upper outer quadrant lumpectomy 03/02/2020 for a pT2 pN0, stage IIA invasive ductal carcinoma, grade 2, with negative margins; estrogen receptor strongly  positive, progesterone receptor moderately positive, HER-2 negative by FISH, MIB-1 of 15%  (a) a total of 3 sentinel lymph nodes were removed  (1) Oncotype score of 40 predicts a risk of recurrence outside the breast within the next 9 years of 28% if the patient's only systemic therapy is antiestrogens for 5 years.  It also predicts a significant benefit from adjuvant chemotherapy.  (2) adjuvant chemotherapy recommended in visit 04/20/2020  (3) genetics testing pending  (4) tamoxifen to start at the completion of chemotherapy   PLAN: I reviewed the pathology results with Logan Wright and his wife.  They are very favorable, with good margins and no lymph node involvement.  Despite that he has a significant risk of developing stage IV disease within the next 9 years if he only uses antiestrogens for systemic therapy.  The reason for breast cancer is primarily a systemic disease.  In addition his Oncotype predicts a significant benefit from chemotherapy.  Logan Wright was reading the percentages initially thinking the benefit would be 15% of 28%.  We clarified the fact that his risk of developing incurable stage IV disease in the next 9years is 28% without chemotherapy and 13% or less with chemotherapy.  This is compelling and this is the reason chemotherapy is standard of care in this setting.  We then discussed the gold standard which is cyclophosphamide and doxorubicin in dose dense fashion x4 followed by paclitaxel.  My recommendation however was that he consider either cyclophosphamide and docetaxel or cyclophosphamide, methotrexate, and  fluorouracil.  We discussed the possible toxicities side effects and complications of all these agents and combinations.  In the and he is strongly leaning towards CMF chemotherapy.  We also discussed the benefits of a port and he is also in favor of that  He wanted to think about it a little more and discuss it with other family members but at this point the likely plan is to start CMF chemotherapy sometime in the next 2 to 3 weeks, as soon as the port can be placed.  He is planning to be treated on Wednesdays since he has weekly events every Tuesday.  I am of course not writing the orders or scheduling treatments until he calls [place] back and let us know his final decision  Total encounter time 45 minutes.Sarajane Jews C. Jossalyn Forgione, MD 04/20/2020 3:17 PM Medical Oncology and Hematology [facility] [place], [place] [address] Tel. [phone]    Fax. [phone]   This document serves as a record of services personally performed by Lurline Del, MD. It was created on his behalf by Wilburn Mylar, a trained medical scribe. The creation of this record is based on the scribe's personal observations and the provider's statements to them.   I, Lurline Del MD, have reviewed the above documentation for accuracy and completeness, and I agree with the above.   *Total Encounter Time as defined by the Centers for Medicare and Medicaid Services includes, in addition to the face-to-face time of a patient visit (documented in the note above) non-face-to-face time: obtaining and reviewing outside history, ordering and reviewing medications, tests or procedures, care coordination (communications with other health care professionals or caregivers) and documentation in the medical record.

## 2020-04-20 ENCOUNTER — Inpatient Hospital Stay: Payer: PPO | Attending: Oncology | Admitting: Oncology

## 2020-04-20 ENCOUNTER — Other Ambulatory Visit: Payer: Self-pay

## 2020-04-20 VITALS — BP 137/82 | HR 59 | Temp 98.9°F | Resp 18 | Ht 67.0 in | Wt 224.0 lb

## 2020-04-20 DIAGNOSIS — C50421 Malignant neoplasm of upper-outer quadrant of right male breast: Secondary | ICD-10-CM | POA: Diagnosis not present

## 2020-04-20 DIAGNOSIS — Z79899 Other long term (current) drug therapy: Secondary | ICD-10-CM | POA: Diagnosis not present

## 2020-04-20 DIAGNOSIS — I129 Hypertensive chronic kidney disease with stage 1 through stage 4 chronic kidney disease, or unspecified chronic kidney disease: Secondary | ICD-10-CM | POA: Insufficient documentation

## 2020-04-20 DIAGNOSIS — Z5189 Encounter for other specified aftercare: Secondary | ICD-10-CM | POA: Diagnosis not present

## 2020-04-20 DIAGNOSIS — N183 Chronic kidney disease, stage 3 unspecified: Secondary | ICD-10-CM | POA: Diagnosis not present

## 2020-04-20 DIAGNOSIS — Z170 Estrogen receptor positive status [ER+]: Secondary | ICD-10-CM | POA: Insufficient documentation

## 2020-04-23 ENCOUNTER — Telehealth: Payer: Self-pay | Admitting: Oncology

## 2020-04-23 ENCOUNTER — Encounter: Payer: Self-pay | Admitting: *Deleted

## 2020-04-23 ENCOUNTER — Other Ambulatory Visit: Payer: Self-pay | Admitting: Oncology

## 2020-04-23 NOTE — Telephone Encounter (Signed)
No 7/9 los. No changes made to pt's schedule.

## 2020-04-25 ENCOUNTER — Encounter: Payer: Self-pay | Admitting: Oncology

## 2020-04-26 ENCOUNTER — Encounter: Payer: Self-pay | Admitting: *Deleted

## 2020-04-27 ENCOUNTER — Other Ambulatory Visit: Payer: Self-pay | Admitting: Surgery

## 2020-04-27 ENCOUNTER — Telehealth: Payer: Self-pay | Admitting: Genetic Counselor

## 2020-04-27 ENCOUNTER — Telehealth: Payer: Self-pay | Admitting: Oncology

## 2020-04-27 NOTE — Telephone Encounter (Signed)
Scheduled appt per 7/16 sch msg - pt aware of apt date and time

## 2020-04-28 ENCOUNTER — Other Ambulatory Visit: Payer: Self-pay | Admitting: Oncology

## 2020-04-28 DIAGNOSIS — Z170 Estrogen receptor positive status [ER+]: Secondary | ICD-10-CM

## 2020-04-28 DIAGNOSIS — Z9011 Acquired absence of right breast and nipple: Secondary | ICD-10-CM

## 2020-04-28 DIAGNOSIS — C50421 Malignant neoplasm of upper-outer quadrant of right male breast: Secondary | ICD-10-CM

## 2020-04-28 MED ORDER — LORAZEPAM 0.5 MG PO TABS: 0.5000 mg | ORAL_TABLET | Freq: Every evening | ORAL | 0 refills | Status: DC | PRN

## 2020-04-28 MED ORDER — DEXAMETHASONE 4 MG PO TABS: 8.0000 mg | ORAL_TABLET | Freq: Every day | ORAL | 1 refills | Status: DC

## 2020-04-28 MED ORDER — LIDOCAINE-PRILOCAINE 2.5-2.5 % EX CREA: TOPICAL_CREAM | CUTANEOUS | 3 refills | Status: DC

## 2020-04-28 NOTE — Progress Notes (Unsigned)
Logan Wright  [phone] Fax:([phone]) [phone]     ID: Logan Wright DOB: March 22, 1946  MR#: [id number]  CXK#:[id number]  Patient Care Team: Logan Shanks, MD as PCP - General (Family Medicine) Kimiyah Blick, [name], MD as Consulting Physician (Oncology) [name], MD as Consulting Physician (General Surgery) Kyung Rudd, MD as Consulting Physician (Radiation Oncology) Lavonna Monarch, MD as Consulting Physician (Dermatology) Paralee Cancel, MD as Consulting Physician (Orthopedic Surgery) Mauro Kaufmann, RN as Oncology Nurse Navigator Rockwell Germany, RN as Oncology Nurse Navigator Chauncey Cruel, MD OTHER MD:  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 He was evaluated in the breast cancer clinic on 03/27/2020.   Since his last visit, Oncotype DX was obtained on the final surgical sample and the recurrence score of 40 predicts a risk of recurrence outside the breast over the next 9 years of 28%, if the patient's only systemic therapy is an antiestrogen for 5 years.  It also predicts a significant benefit from chemotherapy.  He returns today to discuss these results.   REVIEW OF SYSTEMS: Kohle did very well with his surgery, without significant problems with pain bleeding or fever.  He walks at least 45 to 60 minutes most mornings.  He has not yet had genetics testing but this is scheduled for April 30, 2020.  He has not been scheduled for physical therapy yet.  A detailed review of systems today was otherwise stable.   HISTORY OF CURRENT ILLNESS: From the original intake note:  ZEKIEL TORIAN palpated a right breast mass sometime late 2020.  He had a prior left breast mass in the 1990s which was found to be a fat deposit and he assumed this would be the same.   He did mention it to Dr. Jacelyn Grip at the time of their regular physical and he set Logan Wright up for bilateral diagnostic mammography with tomography and right breast ultrasonography at [facility] on 02/21/2020 showing: breast density category B; 3.2 cm irregular mass at 11 o'clock in the right breast; normal right axillary lymph nodes.  Accordingly on 03/02/2020 the patient proceeded to biopsy of the right breast area in question. The pathology from this procedure ([id number]) showed: invasive ductal carcinoma, grade 2-3. Prognostic indicators significant for: estrogen receptor, 90% positive with strong staining intensity and progesterone receptor, 5% positive with moderate staining intensity. Proliferation marker Ki67 at 15%. HER2 equivocal by immunohistochemistry (2+), but negative by fluorescent in situ hybridization with a signals ratio 1.35 and number per cell 3.1.  He met with Dr. Lisbeth Renshaw on 03/16/2020 to discuss adjuvant radiation therapy. Per consultation note,  postmastectomy radiation was felt to be unlikely.  He proceeded to right mastectomy on 03/16/2020 under Dr. [name]. Pathology from the procedure [id number]) showed: invasive ductal carcinoma, grade 2, 3.2 cm; margins not involved.  All three sentinel lymph nodes removed were negative for carcinoma (0/3).  The patient's subsequent history is as detailed below.   PAST MEDICAL HISTORY: Past Medical History:  Diagnosis Date  . CKD (chronic kidney disease), stage III   . Colon adenoma 2015  . Diverticulosis    DENIES   . DJD (degenerative joint disease)   . ED (erectile dysfunction)   . Elevated blood pressure 2007-2008  . Esophageal reflux   . GERD (gastroesophageal reflux disease)   . Gout   .  Gynecomastia    LEFT  . Hearing loss   . HTN (hypertension)   . Hypercholesteremia   . Non-cardiac chest pain   . OA (osteoarthritis)    LEFT SHOULDER  . Onychomycosis of foot with other complication   . Pre-diabetes   . Seborrhea    . Severe frontal headaches    RESOLVED   . Vertigo    RESOLVED   . Wears hearing aid     PAST SURGICAL HISTORY: Past Surgical History:  Procedure Laterality Date  . COLONSCOPY     . HIP ARTHROPLASTY Bilateral    AT [place]   . KNEE ARTHROSCOPY     UNSURE WHICH KNEE   . MASTECTOMY W/ SENTINEL NODE BIOPSY Right 03/16/2020   Procedure: RIGHT BREAST MASTECTOMY WITH SENTINEL LYMPH NODE BIOPSY;  Surgeon: [name], MD;  Location: [facility];  Service: General;  Laterality: Right;  . TOTAL KNEE ARTHROPLASTY Left 11/23/2018   Procedure: TOTAL KNEE ARTHROPLASTY;  Surgeon: Paralee Cancel, MD;  Location: [facility];  Service: Orthopedics;  Laterality: Left;  70 mins    FAMILY HISTORY: Family History  Problem Relation Age of Onset  . Heart attack Mother   . Stroke Mother   . Heart failure Father        22  . Hypertension Neg Hx   The patient is of Ashkenazi background.  His father died at age 95 and his mother at age 34.  On the father's side, the paternal grandfather died young from unclear causes.  The paternal grandmother did not have cancer.  The patient's father was a single child.  On the maternal side the patient's maternal grandmother died young again from unclear reasons but the paternal grandfather and the 3 maternal aunts were all died in old age with no cancer.  The patient also knows of no cancer in his cousins.  He himself has 1 brother with no history of cancer, no sisters.   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  Previously he used to on [employer] which she sold in 1999.  He is a IT sales professional.  His wife of 25+ years Logan Wright is very active in the community.  Their daughter Logan Wright his Engineer, building services for a [facility] psychiatric group.  She lives in [place].  Son Logan Wright lives in [place] by himself.  Patient has 3  grandchildren.  He attends [employer]    ADVANCED DIRECTIVES: In the absence of any documentation to the contrary, the patient's spouse is his HCPOA.    HEALTH MAINTENANCE: Social History   Tobacco Use  . Smoking status: Never Smoker  . Smokeless tobacco: Never Used  Substance Use Topics  . Alcohol use: No    Alcohol/week: 0.0 standard drinks  . Drug use: No     Colonoscopy: [facility]  Bone density: -  PSA:   No Known Allergies  Current Outpatient Medications  Medication Sig Dispense Refill  . allopurinol (ZYLOPRIM) 100 MG tablet Take 200 mg by mouth daily.    Marland Kitchen atorvastatin (LIPITOR) 40 MG tablet Take 40 mg by mouth daily.    Marland Kitchen GLUCOSAMINE-CHONDROITIN PO Take 2 tablets by mouth daily.    Marland Kitchen HYDROcodone-acetaminophen (NORCO/VICODIN) 5-325 MG tablet Take 1 tablet by mouth every 4 (four) hours as needed for moderate pain. (Patient not taking: Reported on 03/27/2020) 25 tablet 0  . losartan-hydrochlorothiazide (HYZAAR)  50-12.5 MG tablet TAKE 1 TABLET BY MOUTH DAILY. 30 tablet 5  . Multiple Vitamin (MULTIVITAMIN) tablet Take 1 tablet by mouth daily.     . Omega-3 Fatty Acids (FISH OIL PO) Take 2,800 mg by mouth daily.     . Potassium 99 MG TABS Take by mouth.     No current facility-administered medications for this visit.    OBJECTIVE: White man in no acute distress  There were no vitals filed for this visit.   There is no height or weight on file to calculate BMI.   Wt Readings from Last 3 Encounters:  04/20/20 224 lb (101.6 kg)  03/27/20 221 lb 12.8 oz (100.6 kg)  03/16/20 220 lb 14.4 oz (100.2 kg)      ECOG FS:1 - Symptomatic but completely ambulatory  Sclerae unicteric, EOMs intact Wearing a mask No cervical or supraclavicular adenopathy Lungs no rales or rhonchi Heart regular rate and rhythm Abd soft, nontender, positive bowel sounds MSK no focal spinal tenderness, no upper extremity lymphedema Neuro: nonfocal, well oriented, appropriate affect Breasts:  The right breast is status post mastectomy.  The incision is healing nicely.  There is no dehiscence, erythema, or swelling.  Left breast shows no suspicious masses.  Both axillae are benign.   LAB RESULTS:  CMP     Component Value Date/Time   NA 142 03/27/2020 1538   K 4.1 03/27/2020 1538   CL 107 03/27/2020 1538   CO2 24 03/27/2020 1538   GLUCOSE 119 (H) 03/27/2020 1538   BUN 21 03/27/2020 1538   CREATININE 1.24 03/27/2020 1538   CREATININE 1.47 (H) 08/24/2015 1257   CALCIUM 9.1 03/27/2020 1538   PROT 6.7 03/27/2020 1538   ALBUMIN 3.1 (L) 03/27/2020 1538   AST 23 03/27/2020 1538   ALT 28 03/27/2020 1538   ALKPHOS 80 03/27/2020 1538   BILITOT 0.5 03/27/2020 1538   GFRNONAA 57 (L) 03/27/2020 1538   GFRAA >60 03/27/2020 1538    No results found for: TOTALPROTELP, ALBUMINELP, A1GS, A2GS, BETS, BETA2SER, GAMS, MSPIKE, SPEI  Lab Results  Component Value Date   WBC 10.7 (H) 03/27/2020   NEUTROABS 6.3 03/27/2020   HGB 14.1 03/27/2020   HCT 43.3 03/27/2020   MCV 85.2 03/27/2020   PLT 293 03/27/2020    No results found for: LABCA2  No components found for: HYQMVH846  No results for input(s): INR in the last 168 hours.  No results found for: LABCA2  No results found for: NGE952  No results found for: WUX324  No results found for: MWN027  No results found for: CA2729  No components found for: HGQUANT  No results found for: CEA1 / No results found for: CEA1   No results found for: AFPTUMOR  No results found for: CHROMOGRNA  No results found for: KPAFRELGTCHN, LAMBDASER, KAPLAMBRATIO (kappa/lambda light chains)  No results found for: HGBA, HGBA2QUANT, HGBFQUANT, HGBSQUAN (Hemoglobinopathy evaluation)   No results found for: LDH  No results found for: IRON, TIBC, IRONPCTSAT (Iron and TIBC)  No results found for: FERRITIN  Urinalysis No results found for: COLORURINE, APPEARANCEUR, LABSPEC, PHURINE, GLUCOSEU, HGBUR, BILIRUBINUR, KETONESUR, PROTEINUR,  UROBILINOGEN, NITRITE, LEUKOCYTESUR   STUDIES: No results found.   ELIGIBLE FOR AVAILABLE RESEARCH PROTOCOL: no  ASSESSMENT: 74 y.o. [place] man status post right breast upper outer quadrant lumpectomy 03/02/2020 for a pT2 pN0, stage IIA invasive ductal carcinoma, grade 2, with negative margins; estrogen receptor strongly positive, progesterone receptor moderately positive, HER-2 negative by FISH, MIB-1 of 15%  (  a) a total of 3 sentinel lymph nodes were removed  (1) Oncotype score of 40 predicts a risk of recurrence outside the breast within the next 9 years of 28% if the patient's only systemic therapy is antiestrogens for 5 years.  It also predicts a significant benefit from adjuvant chemotherapy.  (2) adjuvant chemotherapy recommended in visit 04/20/2020  (3) genetics testing pending  (4) tamoxifen to start at the completion of chemotherapy   PLAN: I reviewed the pathology results with Haziel and his wife.  They are very favorable, with good margins and no lymph node involvement.  Despite that he has a significant risk of developing stage IV disease within the next 9 years if he only uses antiestrogens for systemic therapy.  The reason for breast cancer is primarily a systemic disease.  In addition his Oncotype predicts a significant benefit from chemotherapy.  Krue was reading the percentages initially thinking the benefit would be 15% of 28%.  We clarified the fact that his risk of developing incurable stage IV disease in the next 9years is 28% without chemotherapy and 13% or less with chemotherapy.  This is compelling and this is the reason chemotherapy is standard of care in this setting.  We then discussed the gold standard which is cyclophosphamide and doxorubicin in dose dense fashion x4 followed by paclitaxel.  My recommendation however was that he consider either cyclophosphamide and docetaxel or cyclophosphamide, methotrexate, and fluorouracil.  We discussed the  possible toxicities side effects and complications of all these agents and combinations.  In the and he is strongly leaning towards CMF chemotherapy.  We also discussed the benefits of a port and he is also in favor of that  He wanted to think about it a little more and discuss it with other family members but at this point the likely plan is to start CMF chemotherapy sometime in the next 2 to 3 weeks, as soon as the port can be placed.  He is planning to be treated on Wednesdays since he has weekly events every Tuesday.  I am of course not writing the orders or scheduling treatments until he calls [place] back and let us know his final decision  Total encounter time 45 minutes.Sarajane Jews C. Audreanna Torrisi, MD 04/28/2020 3:48 PM Medical Oncology and Hematology [facility] [place], [place] [address] Tel. [phone]    Fax. [phone]   This document serves as a record of services personally performed by Lurline Del, MD. It was created on his behalf by Wilburn Mylar, a trained medical scribe. The creation of this record is based on the scribe's personal observations and the provider's statements to them.   I, Lurline Del MD, have reviewed the above documentation for accuracy and completeness, and I agree with the above.   *Total Encounter Time as defined by the Centers for Medicare and Medicaid Services includes, in addition to the face-to-face time of a patient visit (documented in the note above) non-face-to-face time: obtaining and reviewing outside history, ordering and reviewing medications, tests or procedures, care coordination (communications with other health care professionals or caregivers) and documentation in the medical record.

## 2020-04-28 NOTE — Progress Notes (Signed)
START OFF PATHWAY REGIMEN - Breast   [id number]:CMF (IV cyclophosphamide) q21 days:   A cycle is every 21 days:     Cyclophosphamide      Methotrexate      Fluorouracil   **Always confirm dose/schedule in your pharmacy ordering system**  Patient Characteristics: Postoperative without Neoadjuvant Therapy (Pathologic Staging), Invasive Disease, Adjuvant Therapy, HER2 Negative/Unknown/Equivocal, ER Positive, Node Negative, pT1a-c, pN0/N66m or pT2 or Higher, pN0, Oncotype High Risk (? 26) Therapeutic Status: Postoperative without Neoadjuvant Therapy (Pathologic Staging) AJCC Grade: GX AJCC N Category: pNX AJCC M Category: cM0 ER Status: Positive (+) AJCC 8 Stage Grouping: IIA HER2 Status: Negative (-) Oncotype Dx Recurrence Score: 40 AJCC T Category: pTX PR Status: Positive (+) Has this patient completed genomic testing<= Yes - Oncotype DX(R) Intent of Therapy: Curative Intent, Discussed with Patient

## 2020-04-30 ENCOUNTER — Inpatient Hospital Stay (HOSPITAL_BASED_OUTPATIENT_CLINIC_OR_DEPARTMENT_OTHER): Payer: PPO | Admitting: Genetic Counselor

## 2020-04-30 ENCOUNTER — Inpatient Hospital Stay: Payer: PPO

## 2020-04-30 ENCOUNTER — Other Ambulatory Visit: Payer: Self-pay

## 2020-04-30 ENCOUNTER — Encounter: Payer: Self-pay | Admitting: *Deleted

## 2020-04-30 ENCOUNTER — Other Ambulatory Visit: Payer: Self-pay | Admitting: Genetic Counselor

## 2020-04-30 ENCOUNTER — Other Ambulatory Visit: Payer: Self-pay | Admitting: Oncology

## 2020-04-30 DIAGNOSIS — C50421 Malignant neoplasm of upper-outer quadrant of right male breast: Secondary | ICD-10-CM

## 2020-04-30 DIAGNOSIS — Z170 Estrogen receptor positive status [ER+]: Secondary | ICD-10-CM

## 2020-04-30 LAB — COMPREHENSIVE METABOLIC PANEL
ALT: 38 U/L (ref 0–44)
AST: 30 U/L (ref 15–41)
Albumin: 3.5 g/dL (ref 3.5–5.0)
Alkaline Phosphatase: 75 U/L (ref 38–126)
Anion gap: 10 (ref 5–15)
BUN: 24 mg/dL — ABNORMAL HIGH (ref 8–23)
CO2: 23 mmol/L (ref 22–32)
Calcium: 9.3 mg/dL (ref 8.9–10.3)
Chloride: 107 mmol/L (ref 98–111)
Creatinine, Ser: 1.59 mg/dL — ABNORMAL HIGH (ref 0.61–1.24)
GFR calc Af Amer: 48 mL/min — ABNORMAL LOW (ref >60)
GFR calc non Af Amer: 42 mL/min — ABNORMAL LOW (ref >60)
Glucose, Bld: 99 mg/dL (ref 70–99)
Potassium: 4.1 mmol/L (ref 3.5–5.1)
Sodium: 140 mmol/L (ref 135–145)
Total Bilirubin: 0.5 mg/dL (ref 0.3–1.2)
Total Protein: 7.2 g/dL (ref 6.5–8.1)

## 2020-04-30 LAB — CBC WITH DIFFERENTIAL/PLATELET
Abs Immature Granulocytes: 0.03 10*3/uL (ref 0.00–0.07)
Basophils Absolute: 0.1 10*3/uL (ref 0.0–0.1)
Basophils Relative: 1 %
Eosinophils Absolute: 0.1 10*3/uL (ref 0.0–0.5)
Eosinophils Relative: 1 %
HCT: 47 % (ref 39.0–52.0)
Hemoglobin: 15.8 g/dL (ref 13.0–17.0)
Immature Granulocytes: 0 %
Lymphocytes Relative: 34 %
Lymphs Abs: 3.1 10*3/uL (ref 0.7–4.0)
MCH: 28.4 pg (ref 26.0–34.0)
MCHC: 33.6 g/dL (ref 30.0–36.0)
MCV: 84.4 fL (ref 80.0–100.0)
Monocytes Absolute: 1.1 10*3/uL — ABNORMAL HIGH (ref 0.1–1.0)
Monocytes Relative: 12 %
Neutro Abs: 4.7 10*3/uL (ref 1.7–7.7)
Neutrophils Relative %: 52 %
Platelets: 276 10*3/uL (ref 150–400)
RBC: 5.57 MIL/uL (ref 4.22–5.81)
RDW: 13.8 % (ref 11.5–15.5)
WBC: 9 10*3/uL (ref 4.0–10.5)
nRBC: 0 % (ref 0.0–0.2)

## 2020-04-30 NOTE — Progress Notes (Signed)
REFERRING PROVIDER: Chauncey Cruel, MD [address] [place],  [place] [address]  PRIMARY PROVIDER:  [name], MD  PRIMARY REASON FOR VISIT:  1. 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 Skeet on 04/30/2020 at 10 AM EDT by MyChart video conference and verified that I am speaking with the correct person using two identifiers.   Patient location: Home Provider location: [place]   Mr. Abe, a 74 y.o. male, was seen for a [place] cancer genetics consultation at the request of Dr. Jana Hakim due to a personal history of breast cancer.  Mr. Ducre presents to clinic today to discuss the possibility of a hereditary predisposition to cancer, genetic testing, and to further clarify his future cancer risks, as well as potential cancer risks for family members.   In June 2021, at the age of 55, Mr. Walen was diagnosed with cancer of the right breast. The treatment plan included surgery and chemotherapy.   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, estrogen receptor positive ([place])   05/03/2020 -  Chemotherapy   The patient had dexamethasone (DECADRON) 4 MG tablet, 8 mg, Oral, Daily, 0 of 1 cycle, Start date: 04/28/2020, End date: -- palonosetron (ALOXI) injection 0.25 mg, 0.25 mg, Intravenous,  Once, 0 of 6 cycles methotrexate (PF) chemo injection 87.5 mg, 40 mg/m2 = 87.5 mg, Intravenous,  Once, 0 of 6 cycles cyclophosphamide (CYTOXAN) 1,320 mg in sodium chloride 0.9 %  250 mL chemo infusion, 600 mg/m2 = 1,320 mg, Intravenous,  Once, 0 of 6 cycles fluorouracil (ADRUCIL) chemo injection 1,300 mg, 600 mg/m2 = 1,300 mg, Intravenous,  Once, 0 of 6 cycles  for chemotherapy treatment.       Past Medical History:  Diagnosis Date   CKD (chronic kidney disease), stage III    Colon adenoma 2015   Diverticulosis    DENIES    DJD (degenerative joint disease)    ED (erectile dysfunction)    Elevated blood pressure 2007-2008   Esophageal reflux    GERD (gastroesophageal reflux disease)    Gout    Gynecomastia    LEFT   Hearing loss    HTN (hypertension)    Hypercholesteremia    Non-cardiac chest pain    OA (osteoarthritis)    LEFT SHOULDER   Onychomycosis of foot with other complication    Pre-diabetes    Seborrhea    Severe frontal headaches    RESOLVED    Vertigo    RESOLVED    Wears hearing aid     Past Surgical History:  Procedure Laterality Date   COLONSCOPY      HIP ARTHROPLASTY Bilateral    AT [facility]    KNEE ARTHROSCOPY     UNSURE WHICH KNEE    MASTECTOMY W/ SENTINEL NODE BIOPSY Right 03/16/2020   Procedure: RIGHT BREAST MASTECTOMY WITH SENTINEL LYMPH NODE BIOPSY;  Surgeon: [name], MD;  Location: [facility];  Service: General;  Laterality: Right;   TOTAL KNEE  ARTHROPLASTY Left 11/23/2018   Procedure: TOTAL KNEE ARTHROPLASTY;  Surgeon: Paralee Cancel, MD;  Location[facility];  Service: Orthopedics;  Laterality: Left;  70 mins    Social History   Socioeconomic History   Marital status: Married    Spouse name: Not on file   Number of children: Not on file   Years of education: Not on file   Highest education level: Not on file  Occupational History   Not on file  Tobacco Use   Smoking status: Never Smoker   Smokeless tobacco: Never Used  Substance and Sexual Activity   Alcohol use: No    Alcohol/week: 0.0 standard drinks   Drug use: No   Sexual activity: Not on file   Other Topics Concern   Not on file  Social History Narrative   Not on file   Social Determinants of Health   Financial Resource Strain:    Difficulty of Paying Living Expenses:   Food Insecurity:    Worried About Charity fundraiser in the Last Year:    Arboriculturist in the Last Year:   Transportation Needs:    Film/video editor (Medical):    Lack of Transportation (Non-Medical):   Physical Activity:    Days of Exercise per Week:    Minutes of Exercise per Session:   Stress:    Feeling of Stress :   Social Connections:    Frequency of Communication with Friends and Family:    Frequency of Social Gatherings with Friends and Family:    Attends Religious Services:    Active Member of Clubs or Organizations:    Attends Music therapist:    Marital Status:      FAMILY HISTORY:  We obtained a detailed, 4-generation family history.  Significant diagnoses are listed below: Family History  Problem Relation Age of Onset   Heart attack Mother    Stroke Mother    Heart failure Father        76   Hypertension Neg Hx     The patient has a son and daughter who are cancer free.  He has a brother who is cancer free.  Both parents are deceased at 67 and 65 years of age.  The patient's mother died at 59.  She had two full sisters and a paternal half sister who are all cancer free. The maternal grandparents died of non-cancer related issues.  The patient's father died at 27 from old age.  He was an only child.  His parents died of unknown causes.  Mr. Haubner is unaware of previous family history of genetic testing for hereditary cancer risks. Patient's maternal ancestors are of [place] descent, and paternal ancestors are of [place] descent. There is reported Ashkenazi Jewish ancestry. There is no known consanguinity.  GENETIC COUNSELING ASSESSMENT: Mr. Vonada is a 74 y.o. male with a personal history of breast cancer which is somewhat suggestive  of a hereditary cancer syndrome and predisposition to cancer given his male breast cancer. We, therefore, discussed and recommended the following at today's visit.   DISCUSSION: We discussed that 2/3 of male breast cancer is hereditary, with most cases associated with BRCA mutations.  There are other genes that can be associated with hereditary male breast cancer syndromes.  These include CHEK2 and PALB2.  We discussed that testing is beneficial for several reasons including knowing how to follow individuals after completing their treatment, identifying whether potential treatment options such as PARP inhibitors would be  beneficial, and understand if other family members could be at risk for cancer and allow them to undergo genetic testing.   We reviewed the characteristics, features and inheritance patterns of hereditary cancer syndromes. We also discussed genetic testing, including the appropriate family members to test, the process of testing, insurance coverage and turn-around-time for results. We discussed the implications of a negative, positive, carrier and/or variant of uncertain significant result. We recommended Mr. Cooprider pursue genetic testing for the common hereditary cancer gene panel. The Common Hereditary Gene Panel offered by [facility] includes sequencing and/or deletion duplication testing of the following 48 genes: APC, ATM, AXIN2, BARD1, BMPR1A, BRCA1, BRCA2, BRIP1, CDH1, CDK4, CDKN2A (p14ARF), CDKN2A (p16INK4a), CHEK2, CTNNA1, DICER1, EPCAM (Deletion/duplication testing only), GREM1 (promoter region deletion/duplication testing only), KIT, MEN1, MLH1, MSH2, MSH3, MSH6, MUTYH, NBN, NF1, NHTL1, PALB2, PDGFRA, PMS2, POLD1, POLE, PTEN, RAD50, RAD51C, RAD51D, RNF43, SDHB, SDHC, SDHD, SMAD4, SMARCA4. STK11, TP53, TSC1, TSC2, and VHL.  The following genes were evaluated for sequence changes only: SDHA and HOXB13 c.251G>A variant only.   Based on Mr. Rasmusson family history of cancer, he meets  medical criteria for genetic testing. Despite that he meets criteria, he may still have an out of pocket cost. We discussed that if his out of pocket cost for testing is over $100, the laboratory will call and confirm whether he wants to proceed with testing.  If the out of pocket cost of testing is less than $100 he will be billed by the genetic testing laboratory.   We discussed that some people do not want to undergo genetic testing due to fear of genetic discrimination.  A federal law called the Genetic Information Non-Discrimination Act (GINA) of 2008 helps protect individuals against genetic discrimination based on their genetic test results.  It impacts both health insurance and employment.  With health insurance, it protects against increased premiums, being kicked off insurance or being forced to take a test in order to be insured.  For employment it protects against hiring, firing and promoting decisions based on genetic test results.  Health status due to a cancer diagnosis is not protected under GINA.   PLAN: After considering the risks, benefits, and limitations, Mr. Kalmbach provided informed consent to pursue genetic testing and the blood sample was sent to [facility] for analysis of the common hereditary cancer panel. Results should be available within approximately 2-3 weeks' time, at which point they will be disclosed by telephone to Mr. Uphoff, as will any additional recommendations warranted by these results. Mr. Guitron will receive a summary of his genetic counseling visit and a copy of his results once available. This information will also be available in Epic.   Lastly, we encouraged Mr. Mooney to remain in contact with cancer genetics annually so that we can continuously update the family history and inform him of any changes in cancer genetics and testing that may be of benefit for this family.   Mr. Eastridge questions were answered to his satisfaction today. Our contact  information was provided should additional questions or concerns arise. Thank you for the referral and allowing [place] to share in the care of your patient.   Norah Devin P. Florene Glen, [place], [facility] Licensed, Insurance risk surveyor [email or web address] phone: [phone]  The patient was seen for a total of 35 minutes in face-to-face genetic counseling.  This patient was discussed with Drs. Magrinat, Lindi Adie and/or Burr Medico who agrees with the above.    _______________________________________________________________________ For Office Staff:  Number of people involved in session:  2 Was an Intern/ student involved with case: no

## 2020-05-01 ENCOUNTER — Other Ambulatory Visit: Payer: Self-pay | Admitting: Oncology

## 2020-05-01 LAB — GENETIC SCREENING ORDER

## 2020-05-03 ENCOUNTER — Encounter: Payer: Self-pay | Admitting: *Deleted

## 2020-05-03 ENCOUNTER — Telehealth: Payer: Self-pay | Admitting: Oncology

## 2020-05-03 ENCOUNTER — Ambulatory Visit: Payer: PPO | Attending: Oncology | Admitting: Physical Therapy

## 2020-05-03 ENCOUNTER — Other Ambulatory Visit: Payer: Self-pay

## 2020-05-03 DIAGNOSIS — R293 Abnormal posture: Secondary | ICD-10-CM | POA: Diagnosis not present

## 2020-05-03 DIAGNOSIS — M25611 Stiffness of right shoulder, not elsewhere classified: Secondary | ICD-10-CM | POA: Insufficient documentation

## 2020-05-03 DIAGNOSIS — Z483 Aftercare following surgery for neoplasm: Secondary | ICD-10-CM | POA: Diagnosis not present

## 2020-05-03 NOTE — Therapy (Signed)
[place], [place], [address] Phone: [phone]   Fax:  [phone]  Physical Therapy Evaluation  Patient Details  Name: Logan Wright MRN: [id number] Date of Birth: 1945-10-19 Referring Provider (PT): Dr. Jana Hakim    Encounter Date: 05/03/2020   PT End of Session - 05/03/20 1701    Visit Number 1    Number of Visits 1    PT Start Time 1600    PT Stop Time 1645    PT Time Calculation (min) 45 min    Activity Tolerance Patient tolerated treatment well    Behavior During Therapy [facility] for tasks assessed/performed           Past Medical History:  Diagnosis Date  . CKD (chronic kidney disease), stage III   . Colon adenoma 2015  . Diverticulosis    DENIES   . DJD (degenerative joint disease)   . ED (erectile dysfunction)   . Elevated blood pressure 2007-2008  . Esophageal reflux   . GERD (gastroesophageal reflux disease)   . Gout   . Gynecomastia    LEFT  . Hearing loss   . HTN (hypertension)   . Hypercholesteremia   . Non-cardiac chest pain   . OA (osteoarthritis)    LEFT SHOULDER  . Onychomycosis of foot with other complication   . Pre-diabetes   . Seborrhea   . Severe frontal headaches    RESOLVED   . Vertigo    RESOLVED   . Wears hearing aid     Past Surgical History:  Procedure Laterality Date  . COLONSCOPY     . HIP ARTHROPLASTY Bilateral    AT [place]   . KNEE ARTHROSCOPY     UNSURE WHICH KNEE   . MASTECTOMY W/ SENTINEL NODE BIOPSY Right 03/16/2020   Procedure: RIGHT BREAST MASTECTOMY WITH SENTINEL LYMPH NODE BIOPSY;  Surgeon: [name], MD;  Location: [facility];  Service: General;  Laterality: Right;  . TOTAL KNEE ARTHROPLASTY Left 11/23/2018   Procedure: TOTAL KNEE ARTHROPLASTY;  Surgeon: Paralee Cancel, MD;  [facility];  Service: Orthopedics;  Laterality: Left;  70 mins    There were no vitals filed for this visit.    Subjective  Assessment - 05/03/20 1610    Subjective Pt states he is not sure why he is here.  Pt states he has problems with his right shoulder for a couple of years but he is doing well.  He feels that his range of motion is back to norms.    Pertinent History pt had right mastectomy on 03/16/2020 with 3 lymph nodes removed.  He will have prophylactic chemo and will not have to have radiation . PMhx: includes 2 hip replacements and one knee replacemen    Currently in Pain? No/denies              [facility] PT Assessment - 05/03/20 0001      Assessment   Medical Diagnosis right breast cancer    Referring Provider (PT) Dr. Jana Hakim     Onset Date/Surgical Date 03/16/20    Hand Dominance Right      Balance Screen   Has the patient fallen in the past 6 months No    Has the patient had a decrease in activity level because of a fear of falling?  No    Is the patient reluctant to leave their home because of a fear of falling?  No  Home Environment   Living Environment Private residence    Living Arrangements Spouse/significant other    Available Help at Discharge Available 24 hours/day      Prior Function   Level of Independence Independent    Vocation Part time employment    Vocation Requirements fused glass     Leisure walks every morning and does aerobic exercises and weight lifting       Cognition   Overall Cognitive Status Within Functional Limits for tasks assessed      Observation/Other Assessments   Observations Pt with healed incision on right chest .  Pt has multiple skin tags Soft fullness above incision      Coordination   Gross Motor Movements are Fluid and Coordinated Yes      Posture/Postural Control   Posture/Postural Control Postural limitations    Postural Limitations Rounded Shoulders;Forward head;Increased lumbar lordosis      AROM   Overall AROM  Deficits    Overall AROM Comments pt reports he is back to baseline. He has had shoulder problems for a long time and is  fine with his exercise program.      Right Shoulder Flexion 135 Degrees    Right Shoulder ABduction 135 Degrees    Left Shoulder Flexion 145 Degrees    Left Shoulder ABduction 145 Degrees      Strength   Overall Strength Within functional limits for tasks performed             LYMPHEDEMA/ONCOLOGY QUESTIONNAIRE - 05/03/20 0001      Type   Cancer Type right breast       Surgeries   Mastectomy Date 03/16/20    Number Lymph Nodes Removed 3      Treatment   Active Chemotherapy Treatment Yes      Right Upper Extremity Lymphedema   15 cm Proximal to Olecranon Process 38 cm    10 cm Proximal to Olecranon Process 35 cm    Olecranon Process 29.5 cm    15 cm Proximal to Ulnar Styloid Process 29 cm    10 cm Proximal to Ulnar Styloid Process 26 cm    Just Proximal to Ulnar Styloid Process 19 cm    Across Hand at [employer] 22 cm    At [place] of 2nd Digit 7 cm      Left Upper Extremity Lymphedema   15 cm Proximal to Olecranon Process 38 cm    10 cm Proximal to Olecranon Process 36 cm    Olecranon Process 30 cm    15 cm Proximal to Ulnar Styloid Process 28.5 cm    10 cm Proximal to Ulnar Styloid Process 25 cm    Just Proximal to Ulnar Styloid Process 18.5 cm   under watchband   Across Hand at [employer] 21 cm    At [place] of 2nd Digit 7 cm                   Objective measurements completed on examination: See above findings.       [place] Adult PT Treatment/Exercise - 05/03/20 0001      Self-Care   Self-Care Other Self-Care Comments    Other Self-Care Comments  gave pt information about ABC class and he will call to schedule it. Also fave him information about [facility]       Exercises   Exercises Shoulder      Shoulder Exercises: Standing   External Rotation Strengthening;Right;Left;5 reps;Theraband  Theraband Level (Shoulder External Rotation) Level 3 (Green)    Row Strengthening;Right;Left;5 reps;Theraband    Theraband Level (Shoulder  Row) Level 3 Nyoka Cowden)                  PT Education - 05/03/20 1700    Education Details ABC class and [employer]) Educated Patient    Methods Explanation;Handout    Comprehension Verbalized understanding                Breast Clinic Goals - 05/03/20 1706      Patient will be able to verbalize understanding of pertinent lymphedema risk reduction practices relevant to her diagnosis specifically related to skin care.   Time 1    Period Days    Status Achieved      Patient will be able to verbalize understanding of the importance of attending the postoperative After Breast Cancer Class for further lymphedema risk reduction education and therapeutic exercise.   Time 1    Period Days    Status Achieved                 Plan - 05/03/20 1701    Clinical Impression Statement Pt comes to PT after mastectomy with 3 nodes removed.  He feels that he is back to normal.  He has always had problems with his shoulders but is happy with the exercise program he is doing.  Baseline measurements of lymphedem and instruction in what to look for and ABC Class and [facility] given instructed in exercise options for shoulder    Personal Factors and Comorbidities Comorbidity 2    Comorbidities previous shoulder impairment, recent mastectomy    Stability/Clinical Decision Making Stable/Uncomplicated    Clinical Decision Making Low    Rehab Potential Good    PT Frequency One time visit    PT Treatment/Interventions ADLs/Self Care Home Management    PT Next Visit Plan One time visit. pt will call if he has further questions.    Consulted and Agree with Plan of Care Patient           Patient will benefit from skilled therapeutic intervention in order to improve the following deficits and impairments:  Decreased knowledge of precautions, Decreased strength, Decreased range of motion, Decreased endurance  Visit Diagnosis: Aftercare following  surgery for neoplasm - Plan: PT plan of care cert/re-cert  Abnormal posture - Plan: PT plan of care cert/re-cert  Stiffness of right shoulder, not elsewhere classified - Plan: PT plan of care cert/re-cert     Problem List Patient Active Problem List   Diagnosis Date Noted  . S/P mastectomy, right 03/16/2020  . 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 Non-cardiac chest pain   . Gynecomastia   . Hypercholesteremia   . Seborrhea   . Severe frontal headaches   . GERD (gastroesophageal reflux disease)   . ED (erectile dysfunction)   . Diverticulosis   . Elevated blood pressure   . Vertigo   . Onychomycosis of foot with other complication   . Hearing loss   . Colon adenoma   . OA (osteoarthritis)   . Esophageal reflux   . Chest pain 07/08/2015  . Essential hypertension 07/08/2015  . Hyperlipidemia 07/08/2015   Donato Heinz. Owens Shark, PT  Norwood Levo 05/03/2020, 5:11 PM  [place]  [place], [place], [address] Phone: [phone]   Fax:  [phone]  Name: REAGAN KLEMZ MRN: [id number] Date of Birth: Feb 16, 1946

## 2020-05-03 NOTE — Telephone Encounter (Signed)
Scheduled appt per 7/20 sch msg - pt is aware of appts added

## 2020-05-04 ENCOUNTER — Telehealth: Payer: Self-pay | Admitting: Oncology

## 2020-05-04 ENCOUNTER — Other Ambulatory Visit (HOSPITAL_COMMUNITY)
Admission: RE | Admit: 2020-05-04 | Discharge: 2020-05-04 | Disposition: A | Discharge status: Home / Self Care | Payer: PPO | Source: Ambulatory Visit | Attending: Surgery | Admitting: Surgery

## 2020-05-04 DIAGNOSIS — Z20822 Contact with and (suspected) exposure to covid-19: Secondary | ICD-10-CM | POA: Insufficient documentation

## 2020-05-04 DIAGNOSIS — Z01812 Encounter for preprocedural laboratory examination: Secondary | ICD-10-CM | POA: Diagnosis not present

## 2020-05-04 LAB — SARS CORONAVIRUS 2 (TAT 6-24 HRS): SARS Coronavirus 2: NEGATIVE

## 2020-05-04 NOTE — Progress Notes (Signed)
Pharmacist Chemotherapy Monitoring - Initial Assessment    Anticipated start date: 05/10/20  Regimen:  . Are orders appropriate based on the patient's diagnosis, regimen, and cycle? Yes . Does the plan date match the patient's scheduled date? Yes . Is the sequencing of drugs appropriate? Yes . Are the premedications appropriate for the patient's regimen? Yes . Prior Authorization for treatment is: Approved o If applicable, is the correct biosimilar selected based on the patient's insurance? not applicable  Organ Function and Labs: Marland Kitchen Are dose adjustments needed based on the patient's renal function, hepatic function, or hematologic function? No . Are appropriate labs ordered prior to the start of patient's treatment? Yes . Other organ system assessment, if indicated: N/A . The following baseline labs, if indicated, have been ordered: N/A  Dose Assessment: . Are the drug doses appropriate? Yes . Are the following correct: o Drug concentrations Yes o IV fluid compatible with drug Yes o Administration routes Yes o Timing of therapy Yes . If applicable, does the patient have documented access for treatment and/or plans for port-a-cath placement? no . If applicable, have lifetime cumulative doses been properly documented and assessed? yes Lifetime Dose Tracking  No doses have been documented on this patient for the following tracked chemicals: Doxorubicin, Epirubicin, Idarubicin, Daunorubicin, Mitoxantrone, Bleomycin, Oxaliplatin, Carboplatin, Liposomal Doxorubicin  o   Toxicity Monitoring/Prevention: . The patient has the following take home antiemetics prescribed: Prochlorperazine and Dexamethasone . The patient has the following take home medications prescribed: N/A . Medication allergies and previous infusion related reactions, if applicable, have been reviewed and addressed. Yes . The patient's current medication list has been assessed for drug-drug interactions with their  chemotherapy regimen. no significant drug-drug interactions were identified on review.  Order Review: . Are the treatment plan orders signed? Yes . Is the patient scheduled to see a provider prior to their treatment? Yes  I verify that I have reviewed each item in the above checklist and answered each question accordingly.   Kennith Center, Pharm.D., CPP 05/04/2020@4 :46 PM

## 2020-05-04 NOTE — Telephone Encounter (Signed)
Scheduled appt per 7/22 sch msg - pt is aware of apt added

## 2020-05-07 ENCOUNTER — Encounter (HOSPITAL_COMMUNITY): Payer: Self-pay | Admitting: Surgery

## 2020-05-07 ENCOUNTER — Encounter: Payer: Self-pay | Admitting: Oncology

## 2020-05-07 ENCOUNTER — Other Ambulatory Visit: Payer: Self-pay

## 2020-05-07 NOTE — H&P (Signed)
  Logan Wright Appointment: 04/17/2020 1:40 PM Location: [facility] Patient #: [id number] DOB: 1946/05/05 Married / Language: Cleophus Molt / Race: White Male   History of Present Illness (Khairi Garman A. [name] MD; 04/17/2020 1:40 PM) The patient is a 74 year old male presenting for a post-operative visit. He is here for another postoperative visit. He reports he is doing well. He has no complaint. He has  seen medical oncology. His oncotype is intermediate so he has elected chemotherapy and needs a port a cath   Allergies Sabino Gasser, CMA; 04/17/2020 1:32 PM) No Known Drug Allergies  [03/06/2020]: Allergies Reconciled   Medication History Sabino Gasser, CMA; 04/17/2020 1:32 PM) Allopurinol (100MG  Tablet, Oral) Active. Atorvastatin Calcium (40MG  Tablet, Oral) Active. Losartan Potassium-HCTZ (50-12.5MG  Tablet, Oral) Active.  Vitals Sabino Gasser CMA; 04/17/2020 1:33 PM) 04/17/2020 1:32 PM Weight: 222.8 lb Height: 67in Body Surface Area: 2.12 m Body Mass Index: 34.9 kg/m  Temp.: 97.32F (Tympanic)  Pulse: 72 (Regular)  BP: 130/82(Sitting, Left Arm, Standard)       Physical Exam (Ladainian Therien A. [name] MD; 04/17/2020 1:40 PM) The physical exam findings are as follows: Note: On exam, incision is well-healed. There is no reaccumulation of seroma fluid. The drain site is stable. Lungs clear CV RRR    Assessment & Plan  RIGHT BREAST CANCER  Impression: He has see medical oncology. His Oncotype put him at intermediate risk for recurrence. He has decided to proceed with chemotherapy. Will proceed to the OR for port a cath insertion.  I discussed the procedure to him.  I discussed the risks which include but are not limited to bleeding, infection, injury to surrounding structures, pneumothorax, the need for further procedures, cardiopulmonary issues, etc.  He agrees to proceed Current Plans

## 2020-05-07 NOTE — Progress Notes (Signed)
Pt denies SOB, chest pain, and being under the care of a cardiologist. Pt stated that PCP is Dr. Ulanda Edison. Pt denies having a cardiac cath and echo. Pt denies having a chest x ray. Pt made aware to stop taking Aspirin (unless otherwise advised by surgeon), vitamins, fish oil, Glucosamine and herbal medications. Do not take any NSAIDs ie: Ibuprofen, Advil, Naproxen (Aleve), Motrin, BC and Goody Powder. Pt made aware of ERAS protocol and stated that he will drink only water until 6:15 A.M. Pt reminded to quarantine. Pt verbalized understanding of all pre-op instructions.

## 2020-05-08 ENCOUNTER — Ambulatory Visit (HOSPITAL_COMMUNITY): Payer: PPO

## 2020-05-08 ENCOUNTER — Other Ambulatory Visit: Payer: Self-pay

## 2020-05-08 ENCOUNTER — Encounter (HOSPITAL_COMMUNITY): Payer: Self-pay | Admitting: Surgery

## 2020-05-08 ENCOUNTER — Ambulatory Visit (HOSPITAL_COMMUNITY)
Admission: RE | Admit: 2020-05-08 | Discharge: 2020-05-08 | Disposition: A | Discharge status: Home / Self Care | Payer: PPO | Attending: Surgery | Admitting: Surgery

## 2020-05-08 ENCOUNTER — Ambulatory Visit (HOSPITAL_COMMUNITY): Payer: PPO | Admitting: Certified Registered Nurse Anesthetist

## 2020-05-08 ENCOUNTER — Encounter (HOSPITAL_COMMUNITY)
Admission: RE | Disposition: A | Discharge status: Home / Self Care | Payer: Self-pay | Source: Home / Self Care | Attending: Surgery

## 2020-05-08 DIAGNOSIS — Z452 Encounter for adjustment and management of vascular access device: Secondary | ICD-10-CM

## 2020-05-08 DIAGNOSIS — Z419 Encounter for procedure for purposes other than remedying health state, unspecified: Secondary | ICD-10-CM

## 2020-05-08 DIAGNOSIS — I10 Essential (primary) hypertension: Secondary | ICD-10-CM | POA: Diagnosis not present

## 2020-05-08 DIAGNOSIS — Z170 Estrogen receptor positive status [ER+]: Secondary | ICD-10-CM | POA: Insufficient documentation

## 2020-05-08 DIAGNOSIS — C50421 Malignant neoplasm of upper-outer quadrant of right male breast: Secondary | ICD-10-CM | POA: Diagnosis not present

## 2020-05-08 DIAGNOSIS — C50921 Malignant neoplasm of unspecified site of right male breast: Secondary | ICD-10-CM | POA: Diagnosis not present

## 2020-05-08 DIAGNOSIS — J9811 Atelectasis: Secondary | ICD-10-CM | POA: Diagnosis not present

## 2020-05-08 DIAGNOSIS — Z853 Personal history of malignant neoplasm of breast: Secondary | ICD-10-CM | POA: Diagnosis not present

## 2020-05-08 DIAGNOSIS — E785 Hyperlipidemia, unspecified: Secondary | ICD-10-CM | POA: Diagnosis not present

## 2020-05-08 HISTORY — DX: Malignant neoplasm of unspecified site of right female breast: C50.911

## 2020-05-08 HISTORY — PX: PORTACATH PLACEMENT: SHX2246

## 2020-05-08 HISTORY — DX: Presence of spectacles and contact lenses: Z97.3

## 2020-05-08 SURGERY — INSERTION, TUNNELED CENTRAL VENOUS DEVICE, WITH PORT
Anesthesia: General | Site: Chest | Laterality: Left

## 2020-05-08 MED ORDER — ONDANSETRON HCL 4 MG/2ML IJ SOLN
INTRAMUSCULAR | Status: AC
  Filled 2020-05-08: qty 2

## 2020-05-08 MED ORDER — DEXAMETHASONE SODIUM PHOSPHATE 10 MG/ML IJ SOLN
INTRAMUSCULAR | Status: DC | PRN
  Administered 2020-05-08: 10 mg via INTRAVENOUS

## 2020-05-08 MED ORDER — CHLORHEXIDINE GLUCONATE 0.12 % MT SOLN
15.0000 mL | Freq: Once | OROMUCOSAL | Status: AC
  Administered 2020-05-08: 15 mL via OROMUCOSAL
  Filled 2020-05-08: qty 15

## 2020-05-08 MED ORDER — HEPARIN SOD (PORK) LOCK FLUSH 100 UNIT/ML IV SOLN
INTRAVENOUS | Status: DC | PRN
  Administered 2020-05-08: 500 [IU] via INTRAVENOUS

## 2020-05-08 MED ORDER — LACTATED RINGERS IV SOLN: INTRAVENOUS | Status: DC

## 2020-05-08 MED ORDER — BUPIVACAINE HCL (PF) 0.25 % IJ SOLN
INTRAMUSCULAR | Status: AC
  Filled 2020-05-08: qty 30

## 2020-05-08 MED ORDER — FENTANYL CITRATE (PF) 100 MCG/2ML IJ SOLN: 25.0000 ug | INTRAMUSCULAR | Status: DC | PRN

## 2020-05-08 MED ORDER — GABAPENTIN 300 MG PO CAPS
300.0000 mg | ORAL_CAPSULE | ORAL | Status: AC
  Administered 2020-05-08: 300 mg via ORAL
  Filled 2020-05-08: qty 1

## 2020-05-08 MED ORDER — LIDOCAINE 2% (20 MG/ML) 5 ML SYRINGE
INTRAMUSCULAR | Status: DC | PRN
  Administered 2020-05-08: 40 mg via INTRAVENOUS

## 2020-05-08 MED ORDER — CHLORHEXIDINE GLUCONATE CLOTH 2 % EX PADS: 6.0000 | MEDICATED_PAD | Freq: Once | CUTANEOUS | Status: DC

## 2020-05-08 MED ORDER — ENSURE PRE-SURGERY PO LIQD: 296.0000 mL | Freq: Once | ORAL | Status: DC

## 2020-05-08 MED ORDER — DEXAMETHASONE SODIUM PHOSPHATE 10 MG/ML IJ SOLN
INTRAMUSCULAR | Status: AC
  Filled 2020-05-08: qty 1

## 2020-05-08 MED ORDER — BUPIVACAINE HCL (PF) 0.25 % IJ SOLN
INTRAMUSCULAR | Status: DC | PRN
  Administered 2020-05-08: 15 mL

## 2020-05-08 MED ORDER — ORAL CARE MOUTH RINSE: 15.0000 mL | Freq: Once | OROMUCOSAL | Status: AC

## 2020-05-08 MED ORDER — ACETAMINOPHEN 500 MG PO TABS
1000.0000 mg | ORAL_TABLET | ORAL | Status: AC
  Administered 2020-05-08: 1000 mg via ORAL
  Filled 2020-05-08: qty 2

## 2020-05-08 MED ORDER — FENTANYL CITRATE (PF) 250 MCG/5ML IJ SOLN
INTRAMUSCULAR | Status: AC
  Filled 2020-05-08: qty 5

## 2020-05-08 MED ORDER — FENTANYL CITRATE (PF) 250 MCG/5ML IJ SOLN
INTRAMUSCULAR | Status: DC | PRN
  Administered 2020-05-08 (×2): 25 ug via INTRAVENOUS

## 2020-05-08 MED ORDER — PROPOFOL 10 MG/ML IV BOLUS
INTRAVENOUS | Status: DC | PRN
  Administered 2020-05-08: 160 mg via INTRAVENOUS

## 2020-05-08 MED ORDER — ONDANSETRON HCL 4 MG/2ML IJ SOLN
INTRAMUSCULAR | Status: DC | PRN
  Administered 2020-05-08: 4 mg via INTRAVENOUS

## 2020-05-08 MED ORDER — LIDOCAINE HCL (PF) 1 % IJ SOLN
INTRAMUSCULAR | Status: AC
  Filled 2020-05-08: qty 30

## 2020-05-08 MED ORDER — LACTATED RINGERS IV SOLN: INTRAVENOUS | Status: DC | PRN

## 2020-05-08 MED ORDER — SODIUM CHLORIDE 0.9 % IV SOLN
INTRAVENOUS | Status: DC | PRN
  Administered 2020-05-08: 500 mL

## 2020-05-08 MED ORDER — SODIUM CHLORIDE 0.9 % IV SOLN
INTRAVENOUS | Status: AC
  Filled 2020-05-08: qty 1.2

## 2020-05-08 MED ORDER — CEFAZOLIN SODIUM-DEXTROSE 2-4 GM/100ML-% IV SOLN
2.0000 g | INTRAVENOUS | Status: AC
  Administered 2020-05-08: 2 g via INTRAVENOUS
  Filled 2020-05-08: qty 100

## 2020-05-08 MED ORDER — HEPARIN SOD (PORK) LOCK FLUSH 100 UNIT/ML IV SOLN
INTRAVENOUS | Status: AC
  Filled 2020-05-08: qty 5

## 2020-05-08 SURGICAL SUPPLY — 46 items
ADH SKN CLS APL DERMABOND .7 (GAUZE/BANDAGES/DRESSINGS) ×1
APL PRP STRL LF DISP 70% ISPRP (MISCELLANEOUS) ×1
BAG DECANTER FOR FLEXI CONT (MISCELLANEOUS) ×2 IMPLANT
CHLORAPREP W/TINT 26 (MISCELLANEOUS) ×2 IMPLANT
COVER SURGICAL LIGHT HANDLE (MISCELLANEOUS) ×2 IMPLANT
COVER TRANSDUCER ULTRASND GEL (DISPOSABLE) IMPLANT
COVER WAND RF STERILE (DRAPES) IMPLANT
DERMABOND ADVANCED (GAUZE/BANDAGES/DRESSINGS) ×1
DERMABOND ADVANCED .7 DNX12 (GAUZE/BANDAGES/DRESSINGS) ×1 IMPLANT
DRAPE C-ARM 42X120 X-RAY (DRAPES) ×2 IMPLANT
DRAPE CHEST BREAST 15X10 FENES (DRAPES) ×2 IMPLANT
DRSG TEGADERM 4X4.75 (GAUZE/BANDAGES/DRESSINGS) ×2 IMPLANT
ELECT CAUTERY BLADE 6.4 (BLADE) ×2 IMPLANT
ELECT REM PT RETURN 9FT ADLT (ELECTROSURGICAL) ×2
ELECTRODE REM PT RTRN 9FT ADLT (ELECTROSURGICAL) ×1 IMPLANT
GAUZE 4X4 16PLY RFD (DISPOSABLE) ×2 IMPLANT
GAUZE SPONGE 2X2 8PLY STRL LF (GAUZE/BANDAGES/DRESSINGS) ×1 IMPLANT
GEL ULTRASOUND 20GR AQUASONIC (MISCELLANEOUS) IMPLANT
GLOVE SURG SIGNA 7.5 PF LTX (GLOVE) ×2 IMPLANT
GOWN STRL REUS W/ TWL LRG LVL3 (GOWN DISPOSABLE) ×1 IMPLANT
GOWN STRL REUS W/ TWL XL LVL3 (GOWN DISPOSABLE) ×1 IMPLANT
GOWN STRL REUS W/TWL LRG LVL3 (GOWN DISPOSABLE) ×2
GOWN STRL REUS W/TWL XL LVL3 (GOWN DISPOSABLE) ×2
INTRODUCER COOK 11FR (CATHETERS) IMPLANT
KIT BASIN OR (CUSTOM PROCEDURE TRAY) ×2 IMPLANT
KIT PORT POWER 8FR ISP CVUE (Port) ×2 IMPLANT
KIT TURNOVER KIT B (KITS) ×2 IMPLANT
NS IRRIG 1000ML POUR BTL (IV SOLUTION) ×2 IMPLANT
PAD ARMBOARD 7.5X6 YLW CONV (MISCELLANEOUS) ×2 IMPLANT
PENCIL BUTTON HOLSTER BLD 10FT (ELECTRODE) IMPLANT
PENCIL SMOKE EVACUATOR (MISCELLANEOUS) ×1 IMPLANT
POSITIONER HEAD DONUT 9IN (MISCELLANEOUS) ×2 IMPLANT
SET INTRODUCER 12FR PACEMAKER (INTRODUCER) IMPLANT
SET SHEATH INTRODUCER 10FR (MISCELLANEOUS) IMPLANT
SHEATH COOK PEEL AWAY SET 9F (SHEATH) IMPLANT
SPONGE GAUZE 2X2 STER 10/PKG (GAUZE/BANDAGES/DRESSINGS) ×1
SUT MNCRL AB 4-0 PS2 18 (SUTURE) ×2 IMPLANT
SUT PROLENE 2 0 SH 30 (SUTURE) ×2 IMPLANT
SUT SILK 2 0 (SUTURE)
SUT SILK 2-0 18XBRD TIE 12 (SUTURE) IMPLANT
SUT VIC AB 3-0 SH 27 (SUTURE) ×2
SUT VIC AB 3-0 SH 27XBRD (SUTURE) ×1 IMPLANT
SYR 5ML LUER SLIP (SYRINGE) ×2 IMPLANT
TOWEL GREEN STERILE (TOWEL DISPOSABLE) ×2 IMPLANT
TOWEL GREEN STERILE FF (TOWEL DISPOSABLE) ×2 IMPLANT
TRAY LAPAROSCOPIC MC (CUSTOM PROCEDURE TRAY) ×2 IMPLANT

## 2020-05-08 NOTE — Interval H&P Note (Signed)
History and Physical Interval Note: no change in H and P  05/08/2020 8:53 AM  Logan Wright  has presented today for surgery, with the diagnosis of BREAST CANCER, NEEDS CHEMOTHERAPY.  The various methods of treatment have been discussed with the patient and family. After consideration of risks, benefits and other options for treatment, the patient has consented to  Procedure(s) with comments: INSERTION PORT-A-CATH WITH ULTRASOUND (N/A) - LMA as a surgical intervention.  The patient's history has been reviewed, patient examined, no change in status, stable for surgery.  I have reviewed the patient's chart and labs.  Questions were answered to the patient's satisfaction.     [name]

## 2020-05-08 NOTE — Transfer of Care (Signed)
Immediate Anesthesia Transfer of Care Note  Patient: Logan Wright  Procedure(s) Performed: INSERTION PORT-A-CATH WITH ULTRASOUND (Left Chest)  Patient Location: PACU  Anesthesia Type:General  Level of Consciousness: awake, alert  and oriented  Airway & Oxygen Therapy: Patient Spontanous Breathing  Post-op Assessment: Report given to RN, Post -op Vital signs reviewed and stable and Patient moving all extremities X 4  Post vital signs: Reviewed and stable  Last Vitals:  Vitals Value Taken Time  BP 149/73 05/08/20 0950  Temp    Pulse 57 05/08/20 0950  Resp    SpO2 96 % 05/08/20 0950  Vitals shown include unvalidated device data.  Last Pain:  Vitals:   05/08/20 0726  PainSc: 0-No pain         Complications: No complications documented.

## 2020-05-08 NOTE — Discharge Instructions (Signed)
Okay to shower starting tomorrow  Ice pack, Tylenol, and ibuprofen for pain

## 2020-05-08 NOTE — Anesthesia Procedure Notes (Signed)
Procedure Name: LMA Insertion Date/Time: 05/08/2020 9:07 AM Performed by: Harden Mo, CRNA Pre-anesthesia Checklist: Patient identified, Emergency Drugs available, Suction available and Patient being monitored Patient Re-evaluated:Patient Re-evaluated prior to induction Oxygen Delivery Method: Circle System Utilized Preoxygenation: Pre-oxygenation with 100% oxygen Induction Type: IV induction Ventilation: Mask ventilation without difficulty LMA: LMA inserted LMA Size: 5.0 Number of attempts: 1 Airway Equipment and Method: Bite block Placement Confirmation: positive ETCO2 Tube secured with: Tape Dental Injury: Teeth and Oropharynx as per pre-operative assessment

## 2020-05-08 NOTE — Op Note (Signed)
° °  ZYRUS HETLAND 05/08/2020   Pre-op Diagnosis: BREAST CANCER, NEEDS CHEMOTHERAPY     Post-op Diagnosis: same  Procedure(s): LEFT SUBCLAVIAN PORT-A-CATH INSERTION (8 fr)  Surgeon(s): [name], MD  Anesthesia: General  Staff:  Circulator: Rometta Emery, RN Radiology Technologist: Ammie Dalton, RT Scrub Person: Martin[place], Karrie S, RN  Estimated Blood Loss: Minimal               Indications: This is a 74 year old gentleman with right breast cancer who is status post a right mastectomy and sentinel node biopsy.  Because of his Oncotype, chemotherapy is been recommended so a Port-A-Cath is being inserted  Procedure: The patient was brought to the operating room identifies correct patient.  He is placed upon the operating table and a roll was placed between his shoulder blades.  General anesthesia was then induced.  His left chest and neck were prepped and draped in usual sterile fashion.  The patient was placed in the Trendelenburg position.  I anesthetized the skin and clavicle with Marcaine.  I then used the introducer needle to easily cannulate the left subclavian vein.  A wire was passed through the needle and into the central venous system under direct fluoroscopy.  I then anesthetized skin further and made an incision with a scalpel at the wire site on the left upper chest.  I then created a pocket for the port with the electrocautery.  An 8 French port was brought to the field.  It was flushed along with the catheter.  I attached the catheter to the port.  I then passed the venous dilator and introducer sheath over the wire and into the central venous system easily.  The wire and dilator were removed.  I then placed the port into the pocket and cut the catheter appropriate length.  I then fed the catheter down the peel-away sheath and into the central venous system.  I then accessed the port and good flush and return were demonstrated.  Fluoroscopy confirmed placement right at  the beginning of the SVC.  I then sutured the port in place with 2 separate 3-0 Prolene sutures.  I accessed the port again and placed concentrated heparin into the port.  I then closed the subcutaneous tissue with interrupted 3-0 Vicryl sutures and closed the skin with a running 4-0 Monocryl.  Dermabond was then applied.  The patient tolerated procedure well.  All the counts were correct at the end of the procedure.  The patient was then extubated in the operating room and taken in stable addition to the recovery room.          [name]   Date: 05/08/2020  Time: 9:50 AM

## 2020-05-08 NOTE — Anesthesia Preprocedure Evaluation (Signed)
Anesthesia Evaluation  Patient identified by MRN, date of birth, ID band Patient awake    Reviewed: Allergy & Precautions, NPO status , Patient's Chart, lab work & pertinent test results  Airway Mallampati: II  TM Distance: >3 FB Neck ROM: Full    Dental no notable dental hx.    Pulmonary neg pulmonary ROS,    Pulmonary exam normal breath sounds clear to auscultation       Cardiovascular hypertension, Normal cardiovascular exam Rhythm:Regular Rate:Normal     Neuro/Psych negative neurological ROS  negative psych ROS   GI/Hepatic Neg liver ROS, GERD  ,  Endo/Other  negative endocrine ROS  Renal/GU Renal InsufficiencyRenal disease  negative genitourinary   Musculoskeletal negative musculoskeletal ROS (+)   Abdominal   Peds negative pediatric ROS (+)  Hematology negative hematology ROS (+)   Anesthesia Other Findings   Reproductive/Obstetrics negative OB ROS                             Anesthesia Physical Anesthesia Plan  ASA: II  Anesthesia Plan: General   Post-op Pain Management:    Induction: Intravenous  PONV Risk Score and Plan: 2 and Ondansetron, Dexamethasone and Treatment may vary due to age or medical condition  Airway Management Planned: LMA  Additional Equipment:   Intra-op Plan:   Post-operative Plan: Extubation in OR  Informed Consent: I have reviewed the patients History and Physical, chart, labs and discussed the procedure including the risks, benefits and alternatives for the proposed anesthesia with the patient or authorized representative who has indicated his/her understanding and acceptance.     Dental advisory given  Plan Discussed with: CRNA and Surgeon  Anesthesia Plan Comments:         Anesthesia Quick Evaluation

## 2020-05-09 ENCOUNTER — Encounter: Payer: Self-pay | Admitting: Oncology

## 2020-05-09 NOTE — Anesthesia Postprocedure Evaluation (Signed)
Anesthesia Post Note  Patient: Logan Wright  Procedure(s) Performed: INSERTION PORT-A-CATH WITH ULTRASOUND (Left Chest)     Patient location during evaluation: PACU Anesthesia Type: General Level of consciousness: awake and alert Pain management: pain level controlled Vital Signs Assessment: post-procedure vital signs reviewed and stable Respiratory status: spontaneous breathing, nonlabored ventilation, respiratory function stable and patient connected to nasal cannula oxygen Cardiovascular status: blood pressure returned to baseline and stable Postop Assessment: no apparent nausea or vomiting Anesthetic complications: no   No complications documented.  Last Vitals:  Vitals:   05/08/20 1000 05/08/20 1015  BP: (!) 132/75 (!) 136/70  Pulse: 51 54  Resp: 18 16  Temp:    SpO2: 93% 96%    Last Pain:  Vitals:   05/08/20 1015  PainSc: 0-No pain                 Nikka Hakimian S

## 2020-05-10 ENCOUNTER — Inpatient Hospital Stay: Payer: PPO

## 2020-05-10 ENCOUNTER — Encounter (HOSPITAL_COMMUNITY): Payer: Self-pay | Admitting: Surgery

## 2020-05-10 ENCOUNTER — Inpatient Hospital Stay: Payer: PPO | Admitting: Oncology

## 2020-05-10 ENCOUNTER — Inpatient Hospital Stay (HOSPITAL_BASED_OUTPATIENT_CLINIC_OR_DEPARTMENT_OTHER): Payer: PPO | Admitting: Adult Health

## 2020-05-10 ENCOUNTER — Encounter: Payer: Self-pay | Admitting: *Deleted

## 2020-05-10 ENCOUNTER — Encounter: Payer: Self-pay | Admitting: Oncology

## 2020-05-10 ENCOUNTER — Other Ambulatory Visit: Payer: Self-pay

## 2020-05-10 VITALS — BP 148/73 | HR 60 | Temp 98.3°F | Resp 17 | Ht 67.0 in | Wt 227.4 lb

## 2020-05-10 DIAGNOSIS — Z170 Estrogen receptor positive status [ER+]: Secondary | ICD-10-CM | POA: Diagnosis not present

## 2020-05-10 DIAGNOSIS — C50421 Malignant neoplasm of upper-outer quadrant of right male breast: Secondary | ICD-10-CM | POA: Diagnosis not present

## 2020-05-10 DIAGNOSIS — Z95828 Presence of other vascular implants and grafts: Secondary | ICD-10-CM

## 2020-05-10 MED ORDER — ALTEPLASE 2 MG IJ SOLR
2.0000 mg | Freq: Once | INTRAMUSCULAR | Status: AC
  Administered 2020-05-10: 2 mg
  Filled 2020-05-10: qty 2

## 2020-05-10 MED ORDER — SODIUM CHLORIDE 0.9% FLUSH
10.0000 mL | Freq: Once | INTRAVENOUS | Status: AC
  Administered 2020-05-10: 10 mL via INTRAVENOUS
  Filled 2020-05-10: qty 10

## 2020-05-10 MED ORDER — ALTEPLASE 2 MG IJ SOLR
INTRAMUSCULAR | Status: AC
  Filled 2020-05-10: qty 2

## 2020-05-10 NOTE — Progress Notes (Signed)
Met with patient at registration to introduce myself as Financial Resource Specialist and to offer available resources.  Discussed one-time $1000 [employer] grant and qualifications to assist with personal expenses while going through treatment.  Gave him my card if interested in applying and for any additional financial questions or concerns.  

## 2020-05-10 NOTE — Progress Notes (Signed)
Logan Wright  [phone]) [phone] [phone]) [phone]     ID: Logan Wright DOB: [birth year]  MR#: [id number]  KCL#:[id number]  Patient Care Team: [name], MD as PCP - General (Family Medicine) Magrinat, [name], MD as Consulting Physician (Oncology) [name], MD as Consulting Physician (General Surgery) Kyung Rudd, MD as Consulting Physician (Radiation Oncology) Lavonna Monarch, MD as Consulting Physician (Dermatology) Paralee Cancel, MD as Consulting Physician (Orthopedic Surgery) Mauro Kaufmann, RN as Oncology Nurse Navigator Rockwell Germany, RN as Oncology Nurse [place], NP OTHER MD:  CHIEF COMPLAINT: estrogen receptor positive breast cancer  CURRENT TREATMENT: Adjuvant chemotherapy   INTERVAL HISTORY: Logan Wright returns today for follow up of his estrogen receptor positive breast cancer unaccompanied.  He is here to start adjuvant chemotherapy with CMF.  This treatment is given on day 1 of a 21 day cycle x 8.    He is doing well and notes that they were unable to access his port for his lab work.  He does not want labs drawn because he notes that he has bad veins and if they cannot get the blood from his port, then he doesn't want his labs ran, as he wants to postpone chemotherapy and fix the port.     REVIEW OF SYSTEMS: Logan Wright is otherwise doing very well.  He has his anti nausea medications.  He notes that his mastectomy site is healing well and he walked 1 hour today and rode his bike for 15 minutes.     He denies any new issues such as fever, chills, chest pain, palpitations, cough, shortness of breath, bowel/bladder changes, nausea, vomiting, headaches, vision issues.  A detailed ROS was non contributory.     HISTORY OF CURRENT ILLNESS: From the original intake note:  Logan Wright palpated a right breast mass sometime late 2020.  He had a prior left breast mass in the 1990s which was found to be a fat deposit and he  assumed this would be the same.  He did mention it to Dr. Jacelyn Grip at the time of their regular physical and he set Logan Wright up for bilateral diagnostic mammography with tomography and right breast ultrasonography at [facility] on 02/21/2020 showing: breast density category B; 3.2 cm irregular mass at 11 o'clock in the right breast; normal right axillary lymph nodes.  Accordingly on 03/02/2020 the patient proceeded to biopsy of the right breast area in question. The pathology from this procedure ([id number]) showed: invasive ductal carcinoma, grade 2-3. Prognostic indicators significant for: estrogen receptor, 90% positive with strong staining intensity and progesterone receptor, 5% positive with moderate staining intensity. Proliferation marker Ki67 at 15%. HER2 equivocal by immunohistochemistry (2+), but negative by fluorescent in situ hybridization with a signals ratio 1.35 and number per cell 3.1.  He met with Dr. Lisbeth Renshaw on 03/16/2020 to discuss adjuvant radiation therapy. Per consultation note,  postmastectomy radiation was felt to be unlikely.  He proceeded to right mastectomy on 03/16/2020 under Dr. [name]. Pathology from the procedure [id number]) showed: invasive ductal carcinoma, grade 2, 3.2 cm; margins not involved.  All three sentinel lymph nodes removed were negative for carcinoma (0/3).  The patient's subsequent history is as detailed below.   PAST MEDICAL HISTORY: Past Medical History:  Diagnosis Date  . Breast cancer, right ([place])   . CKD (chronic kidney disease), stage III   . Colon adenoma 2015  . Diverticulosis    DENIES   . DJD (degenerative joint disease)   .  ED (erectile dysfunction)   . Elevated blood pressure 2007-2008  . Esophageal reflux   . GERD (gastroesophageal reflux disease)   . Gout   . Gynecomastia    LEFT  . Hearing loss   . HTN (hypertension)   . Hypercholesteremia   . Non-cardiac chest pain   . OA (osteoarthritis)    LEFT SHOULDER  . Onychomycosis  of foot with other complication   . Pre-diabetes   . Seborrhea   . Severe frontal headaches    RESOLVED   . Vertigo    RESOLVED   . Wears glasses   . Wears hearing aid     PAST SURGICAL HISTORY: Past Surgical History:  Procedure Laterality Date  . COLONSCOPY     . HIP ARTHROPLASTY Bilateral    AT [facility]   . KNEE ARTHROSCOPY     UNSURE WHICH KNEE   . MASTECTOMY W/ SENTINEL NODE BIOPSY Right 03/16/2020   Procedure: RIGHT BREAST MASTECTOMY WITH SENTINEL LYMPH NODE BIOPSY;  Surgeon: [name], MD;  Location: [facility];  Service: General;  Laterality: Right;  . PORTACATH PLACEMENT Left 05/08/2020   Procedure: INSERTION PORT-A-CATH WITH ULTRASOUND;  Surgeon: [name], MD;  Location: [facility];  Service: General;  Laterality: Left;  LMA  . TOTAL KNEE ARTHROPLASTY Left 11/23/2018   Procedure: TOTAL KNEE ARTHROPLASTY;  Surgeon: Paralee Cancel, MD;  Location: [facility];  Service: Orthopedics;  Laterality: Left;  70 mins    FAMILY HISTORY: Family History  Problem Relation Age of Onset  . Heart attack Mother   . Stroke Mother   . Heart failure Father        14  . Hypertension Neg Hx   The patient is of Ashkenazi background.  His father died at age 53 and his mother at age 52.  On the father's side, the paternal grandfather died young from unclear causes.  The paternal grandmother did not have cancer.  The patient's father was a single child.  On the maternal side the patient's maternal grandmother died young again from unclear reasons but the paternal grandfather and the 3 maternal aunts were all died in old age with no cancer.  The patient also knows of no cancer in his cousins.  He himself has 1 brother with no history of cancer, no sisters.   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  Previously he used to on [employer] which she sold in  1999.  He is a IT sales professional.  His wife of 62+ years Logan Wright is very active in the community.  Their daughter Logan Wright his Engineer, building services for a [facility] psychiatric group.  She lives in [place].  Son Logan Wright lives in [place] by himself.  Patient has 3 grandchildren.  He attends [employer]    ADVANCED DIRECTIVES: In the absence of any documentation to the contrary, the patient's spouse is his HCPOA.    HEALTH MAINTENANCE: Social History   Tobacco Use  . Smoking status: Never Smoker  . Smokeless tobacco: Never Used  Vaping Use  . Vaping Use: Never used  Substance Use Topics  . Alcohol use: No    Alcohol/week: 0.0 standard drinks  . Drug use: No     Colonoscopy: [facility]  Bone density: -  PSA:   No Known Allergies  Current Outpatient Medications  Medication Sig Dispense Refill  . allopurinol (ZYLOPRIM)  100 MG tablet Take 200 mg by mouth daily.    Marland Kitchen atorvastatin (LIPITOR) 40 MG tablet Take 40 mg by mouth daily.    Marland Kitchen dexamethasone (DECADRON) 4 MG tablet Take 2 tablets (8 mg total) by mouth daily. Start the day after chemotherapy for 2 days. Take with food. 30 tablet 1  . GLUCOSAMINE-CHONDROITIN PO Take 2 tablets by mouth daily.    Marland Kitchen HYDROcodone-acetaminophen (NORCO/VICODIN) 5-325 MG tablet Take 1 tablet by mouth every 4 (four) hours as needed for moderate pain. (Patient not taking: Reported on 04/30/2020) 25 tablet 0  . lidocaine-prilocaine (EMLA) cream Apply to affected area once (Patient taking differently: Apply 1 application topically daily as needed (port access). ) 30 g 3  . LORazepam (ATIVAN) 0.5 MG tablet Take 1 tablet (0.5 mg total) by mouth at bedtime as needed (Nausea or vomiting). 20 tablet 0  . losartan-hydrochlorothiazide (HYZAAR) 50-12.5 MG tablet TAKE 1 TABLET BY MOUTH DAILY. (Patient taking differently: Take 1 tablet by mouth daily. ) 30 tablet 5  . Multiple Vitamin (MULTIVITAMIN) tablet Take 1 tablet by mouth daily.     . Omega-3 Fatty Acids  (FISH OIL ULTRA) 1400 MG CAPS Take 1,400-2,800 mg by mouth See admin instructions. Take 1400 mg in the morning and 2800 mg in the evening    . Potassium 99 MG TABS Take 99 mg by mouth daily.      No current facility-administered medications for this visit.    OBJECTIVE: White man in no acute distress  Vitals:   05/10/20 1317  BP: (!) 148/73  Pulse: 60  Resp: 17  Temp: 98.3 F (36.8 C)  SpO2: 99%     Body mass index is 35.62 kg/m.   Wt Readings from Last 3 Encounters:  05/10/20 (!) 227 lb 6.4 oz (103.1 kg)  05/08/20 (!) 220 lb (99.8 kg)  04/20/20 224 lb (101.6 kg)      ECOG FS:1 - Symptomatic but completely ambulatory  GENERAL: Patient is a well appearing male in no acute distress HEENT:  Sclerae anicteric. Mask in place.  Neck is supple.  NODES:  No cervical, supraclavicular, or axillary lymphadenopathy palpated.  BREAST EXAM:  S/p right mastectomy, healing well.  nO sign of infection LUNGS:  Clear to auscultation bilaterally.  No wheezes or rhonchi. HEART:  Regular rate and rhythm. No murmur appreciated. ABDOMEN:  Soft, nontender.  Positive, normoactive bowel sounds. No organomegaly palpated. MSK:  No focal spinal tenderness to palpation. Full range of motion bilaterally in the upper extremities. EXTREMITIES:  No peripheral edema.   SKIN:  Clear with no obvious rashes or skin changes. No nail dyscrasia. NEURO:  Nonfocal. Well oriented.  Appropriate affect.    LAB RESULTS:  CMP     Component Value Date/Time   NA 140 04/30/2020 1547   K 4.1 04/30/2020 1547   CL 107 04/30/2020 1547   CO2 23 04/30/2020 1547   GLUCOSE 99 04/30/2020 1547   BUN 24 (H) 04/30/2020 1547   CREATININE 1.59 (H) 04/30/2020 1547   CREATININE 1.47 (H) 08/24/2015 1257   CALCIUM 9.3 04/30/2020 1547   PROT 7.2 04/30/2020 1547   ALBUMIN 3.5 04/30/2020 1547   AST 30 04/30/2020 1547   ALT 38 04/30/2020 1547   ALKPHOS 75 04/30/2020 1547   BILITOT 0.5 04/30/2020 1547   GFRNONAA 42 (L) 04/30/2020  1547   GFRAA 48 (L) 04/30/2020 1547    No results found for: TOTALPROTELP, ALBUMINELP, A1GS, A2GS, BETS, BETA2SER, GAMS, MSPIKE, SPEI  Lab Results  Component Value Date   WBC 9.0 04/30/2020   NEUTROABS 4.7 04/30/2020   HGB 15.8 04/30/2020   HCT 47.0 04/30/2020   MCV 84.4 04/30/2020   PLT 276 04/30/2020    No results found for: LABCA2  No components found for: MBTDHR416  No results for input(s): INR in the last 168 hours.  No results found for: LABCA2  No results found for: LAG536  No results found for: IWO032  No results found for: ZYY482  No results found for: CA2729  No components found for: HGQUANT  No results found for: CEA1 / No results found for: CEA1   No results found for: AFPTUMOR  No results found for: CHROMOGRNA  No results found for: KPAFRELGTCHN, LAMBDASER, KAPLAMBRATIO (kappa/lambda light chains)  No results found for: HGBA, HGBA2QUANT, HGBFQUANT, HGBSQUAN (Hemoglobinopathy evaluation)   No results found for: LDH  No results found for: IRON, TIBC, IRONPCTSAT (Iron and TIBC)  No results found for: FERRITIN  Urinalysis No results found for: COLORURINE, APPEARANCEUR, LABSPEC, PHURINE, GLUCOSEU, HGBUR, BILIRUBINUR, KETONESUR, PROTEINUR, UROBILINOGEN, NITRITE, LEUKOCYTESUR   STUDIES: DG CHEST PORT 1 VIEW  Result Date: 05/08/2020 CLINICAL DATA:  Central line placement EXAM: PORTABLE CHEST 1 VIEW COMPARISON:  01/24/2018 FINDINGS: Left Port-A-Cath placement with the tip in the upper SVC. No pneumothorax. Heart is normal size. Left base atelectasis. Right lung clear. No effusions or acute bony abnormality. IMPRESSION: Left subclavian Port-A-Cath placement with the tip in the upper SVC. No pneumothorax. Left base atelectasis. Electronically Signed   By: Rolm Baptise M.D.   On: 05/08/2020 10:17   DG Fluoro Guide CV Line-No Report  Result Date: 05/08/2020 Fluoroscopy was utilized by the requesting physician.  No radiographic interpretation.      ELIGIBLE FOR AVAILABLE RESEARCH PROTOCOL: no  ASSESSMENT: 74 y.o. Logan Wright man status post right breast upper outer quadrant lumpectomy 03/02/2020 for a pT2 pN0, stage IIA invasive ductal carcinoma, grade 2, with negative margins; estrogen receptor strongly positive, progesterone receptor moderately positive, HER-2 negative by FISH, MIB-1 of 15%  (a) a total of 3 sentinel lymph nodes were removed  (1) Oncotype score of 40 predicts a risk of recurrence outside the breast within the next 9 years of 28% if the patient's only systemic therapy is antiestrogens for 5 years.  It also predicts a significant benefit from adjuvant chemotherapy.  (a) Adjuvant CMF chemotherapy to start 05/10/2020 and to be given every 21 days x 8 cycles  (2) adjuvant chemotherapy recommended in visit 04/20/2020  (3) genetics testing pending  (4) tamoxifen to start at the completion of chemotherapy   PLAN: Emmanual is here today to get started on his chemotherapy with CMF.  He has his anti emetics.  He understands how to take them, he understands the risks and benefits of chemotherapy and is wiling to proceed.    Unfortunately, his port is not giving [place] blood return.  He has cathflo in his port now, and if he gets blood return he will get chemotherapy.  If not, we will not draw labs today and send him for a dye study on his port.    He notes that he understandably does not want to receive the chemotherapy or have labs drawn because he has difficult veins.    We will await on his port assessment by the nurse.  At either rate, he will return in one week for labs and f/u with Dr. Jana Hakim.    She knows to call for any questions that may arise between now  and her next appointment.  We are happy to see her sooner if needed.  Total encounter time 20 minutes.Wilber Bihari, NP 05/10/20 1:38 PM [facility] Oncology [facility] [place], [place] [address] Tel. [phone]     Fax. [phone]   *Total Encounter Time as defined by the Centers for Medicare and Medicaid Services includes, in addition to the face-to-face time of a patient visit (documented in the note above) non-face-to-face time: obtaining and reviewing outside history, ordering and reviewing medications, tests or procedures, care coordination (communications with other health care professionals or caregivers) and documentation in the medical record.

## 2020-05-10 NOTE — Progress Notes (Signed)
Cathflo removed from porta cath after dwelling for over 2 hours. Small amount of blood noted. Porta cath needle removed and dye study ordered for port. Dr. Jana Hakim and Wilber Bihari, NP aware. Scheduling message sent to reschedule after dye study.

## 2020-05-11 ENCOUNTER — Encounter (HOSPITAL_COMMUNITY): Payer: Self-pay | Admitting: Surgery

## 2020-05-11 ENCOUNTER — Encounter: Payer: Self-pay | Admitting: Oncology

## 2020-05-11 ENCOUNTER — Telehealth: Payer: Self-pay | Admitting: Adult Health

## 2020-05-11 NOTE — Telephone Encounter (Signed)
No 7/29 los. No changes made to pt's schedule.

## 2020-05-14 ENCOUNTER — Other Ambulatory Visit: Payer: Self-pay | Admitting: Adult Health

## 2020-05-14 ENCOUNTER — Encounter: Payer: Self-pay | Admitting: *Deleted

## 2020-05-14 ENCOUNTER — Ambulatory Visit (HOSPITAL_COMMUNITY)
Admission: RE | Admit: 2020-05-14 | Discharge: 2020-05-14 | Disposition: A | Discharge status: Home / Self Care | Payer: PPO | Source: Ambulatory Visit | Attending: Adult Health | Admitting: Adult Health

## 2020-05-14 ENCOUNTER — Encounter: Payer: Self-pay | Admitting: Oncology

## 2020-05-14 ENCOUNTER — Other Ambulatory Visit: Payer: Self-pay | Admitting: Oncology

## 2020-05-14 ENCOUNTER — Other Ambulatory Visit: Payer: Self-pay | Admitting: *Deleted

## 2020-05-14 ENCOUNTER — Encounter: Payer: Self-pay | Admitting: Genetic Counselor

## 2020-05-14 ENCOUNTER — Telehealth: Payer: Self-pay | Admitting: Genetic Counselor

## 2020-05-14 DIAGNOSIS — C50421 Malignant neoplasm of upper-outer quadrant of right male breast: Secondary | ICD-10-CM | POA: Insufficient documentation

## 2020-05-14 DIAGNOSIS — Z170 Estrogen receptor positive status [ER+]: Secondary | ICD-10-CM | POA: Diagnosis not present

## 2020-05-14 DIAGNOSIS — T82868A Thrombosis of vascular prosthetic devices, implants and grafts, initial encounter: Secondary | ICD-10-CM | POA: Diagnosis not present

## 2020-05-14 DIAGNOSIS — Z452 Encounter for adjustment and management of vascular access device: Secondary | ICD-10-CM | POA: Diagnosis not present

## 2020-05-14 DIAGNOSIS — Z1379 Encounter for other screening for genetic and chromosomal anomalies: Secondary | ICD-10-CM | POA: Insufficient documentation

## 2020-05-14 HISTORY — PX: IR CV LINE INJECTION: IMG2294

## 2020-05-14 MED ORDER — HEPARIN SOD (PORK) LOCK FLUSH 100 UNIT/ML IV SOLN
INTRAVENOUS | Status: AC
  Filled 2020-05-14: qty 5

## 2020-05-14 MED ORDER — IOHEXOL 300 MG/ML  SOLN
50.0000 mL | Freq: Once | INTRAMUSCULAR | Status: AC | PRN
  Administered 2020-05-14: 15 mL via INTRAVENOUS

## 2020-05-14 NOTE — Telephone Encounter (Signed)
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  Discussed the cancer risks and that this explains his diagnosis of breast cancer.  Discussed that his children and brother should undergo genetic testing as well.

## 2020-05-15 ENCOUNTER — Ambulatory Visit: Payer: Self-pay | Admitting: Genetic Counselor

## 2020-05-15 ENCOUNTER — Encounter: Payer: Self-pay | Admitting: *Deleted

## 2020-05-15 ENCOUNTER — Inpatient Hospital Stay: Payer: PPO

## 2020-05-15 DIAGNOSIS — Z1379 Encounter for other screening for genetic and chromosomal anomalies: Secondary | ICD-10-CM

## 2020-05-15 NOTE — Progress Notes (Signed)
GENETIC TEST RESULTS   Patient Name: Logan Wright Patient Age: 74 y.o. Encounter Date: 05/15/2020  Referring Provider: Lurline Del, MD    [name] was contacted by telephone on May 14, 2020 with test results from his recent genetic testing.  He underwent genetic testing due to a personal history of cancer and concern regarding a hereditary predisposition to cancer in the family. Please refer to the prior Genetics clinic note for more information regarding Logan Wright medical and family histories and our assessment at the time.   FAMILY HISTORY:  We obtained a detailed, 4-generation family history.  Significant diagnoses are listed below: Family History  Problem Relation Age of Onset  . Heart attack Mother   . Stroke Mother   . Heart failure Father        77  . Hypertension Neg Hx     The patient has a son and daughter who are cancer free.  He has a brother who is cancer free.  Both parents are deceased at 1 and 2 years of age.  The patient's mother died at 25.  She had two full sisters and a paternal half sister who are all cancer free. The maternal grandparents died of non-cancer related issues.  The patient's father died at 66 from old age.  He was an only child.  His parents died of unknown causes.  Logan Wright is unaware of previous family history of genetic testing for hereditary cancer risks. Patient's maternal ancestors are of [place] descent, and paternal ancestors are of [place] descent. There is reported Ashkenazi Jewish ancestry. There is no known consanguinity.    GENETIC TESTING:  At the time of Logan Wright visit, we recommended he pursue genetic testing of the Common Hereditary Cancer test. 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 There were no deleterious mutations in APC, ATM, AXIN2, BARD1, BMPR1A,  BRCA2, BRIP1, CDH1, CDK4, CDKN2A, CHEK2, CTNNA1, DICER1, EPCAM, GREM1, HOXB13, KIT, MEN1, MLH1, MSH2, MSH3, MSH6, MUTYH, NBN, NF1, NTHL1, PALB2, PDGFRA, PMS2, POLD1, POLE, PTEN, RAD50, RAD51C, RAD51D, RNF43, SDHA, SDHB, SDHC, SDHD, SMAD4, SMARCA4. STK11, TP53, TSC1, TSC2, and VHL .    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  At this time, it is unknown if this variant is associated with increased cancer risk or if this is a normal finding, but most variants such as this get reclassified to being inconsequential. It should not be used to make medical management decisions. With time, we suspect the lab will determine the significance of this variant, if any. If we do learn more about it, we will try to contact Logan Wright to discuss it further. However, it is important to stay in touch with [place] periodically and keep the address and phone number up to date.  CLINICAL CONDITION: The average woman's lifetime risk of developing breast cancer is 12%; her risk for developing ovarian cancer is 1.3% (SEER database 2014. https://seer.[employer]avedPoints.is. Accessed September 2017). Most cases of these cancers are sporadic and are not due to hereditary factors, but approximately 5-10% of breast and ovarian cancer cases are hereditary and due to an identifiable pathogenic variant in a disease-causing gene. Hereditary breast and ovarian cancer syndrome (HBOC) due to pathogenic variants in the BRCA1 and BRCA2 genes accounts for the majority of hereditary breast and ovarian cancer cases in individuals  with a strong family history or an early-onset diagnosis.  HBOC syndrome is characterized by an increased lifetime risk for generally adult-onset cancers including breast, contralateral breast, male breast, ovarian, prostate, and pancreatic (PMID: 59563875).  The cancers associated with BRCA1 are:  Marland Kitchen Breast cancer, up to a 87% risk (PMID: 6433295,  18841660) . Ovarian cancer, up to a 54% risk (PMID: 6301601, 09323557) . Pancreatic cancer, 1-3% (PMID: 32202542, 70623762, 83151761, 60737106) . Prostate cancer, elevated (26948546, 27035009)  INHERITANCE: Hereditary predisposition to cancer due to pathogenic variants in the BRCA1 gene has autosomal dominant inheritance. This means that an individual with a pathogenic variant has a 50% chance of passing the condition on to his/her offspring. Most cases are inherited from a parent, but some cases may occur spontaneously (i.e., an individual with a pathogenic variant has parents who do not have it). Identification of a pathogenic variant allows for the recognition of at-risk relatives who can pursue testing for the familial variant.  MANAGEMENT Management Guidelines for individuals with pathogenic BRCA1 variants have been developed by the [employer]  (NCCN):  NCCN cancer surveillance:  Females:  . Breast awareness starting at age 78 . Clinical breast exams every 6-12 months beginning at 75 years of age or at the age of the earliest diagnosed breast cancer in the family, if below age 43 . Annual breast MRIs with contrast beginning between the ages of 73 and 73 (or annual mammograms with consideration of tomosynthesis if MRI is unavailable), although the age to initiate screening may be individualized based on family history . Annual breast MRI with contrast and annual mammography with consideration of tomosynthesis between the ages of 74 and 57 . After age 54, management should be considered on an individual basis. . For women treated for breast cancer, screening of remaining breast tissue with annual mammography and breast MRI should continue. . Consider risk-reducing mastectomy and counsel regarding degree of protection, degree of cancer risk, and reconstruction options. o Address the psychosocial, social, and quality-of-life aspects of undergoing risk-reducing  mastectomy. . Recommend risk-reducing salpingo-oophorectomy, typically between age 30 and 30 years and upon the completion of childbearing. See specific Risk-Reducing Salpingo-Oophorectomy (RRSO) Protocol in NCCN Guidelines for Ovarian Cancer - Principles of Surgery. . Counseling includes a discussion of reproductive desires, extent of cancer risk, degree of protection for breast and ovarian cancer, management of menopausal symptoms, possible short-term hormonal replacement and medical issues. . Salpingectomy alone is not the standard of care for risk reduction, although clinical trials of interval salpingectomy and delayed oophorectomy are ongoing. The concern for risk reducing salpingectomy alone is that women are still at risk for developing ovarian cancer. In addition, in pre-menopausal women, oophorectomy likely reduces the risk of developing breast cancer, but the magnitude is uncertain and may be gene specific. . For those patients who have not elected RRSO, transvaginal ultrasound combined with serum CA-125 for ovarian cancer screening has not been shown to be sufficient sensitive or specific as to support a positive recommendation, but, although of uncertain benefit, it may be considered at the clinician's discretion starting at age 47-35 years. . Consider risk-reducing agents as options for breast and ovarian cancer.  Males:  . Breast self-exam training and education starting at age 51 years . Clinical breast exam every 12 months, beginning at age 23 years . Consider prostate-cancer screening beginning at age 68 years.  Pancreatic cancer: NCCN cites insufficient evidence to warrant screening for pancreatic cancer (NCCN. Genetic/Familial High-Risk Assessment: Breast and Ovarian. Version  1.2021. In contrast, the [employer] of Gastroenterology Clinical Guidelines recommend pancreatic cancer screening in BRCA1 carriers be limited to those with a first- or second-degree relative affected  with pancreatic cancer. Ideally, screening should be performed in experienced centers utilizing a multidisciplinary approach under research conditions. Recommended screening includes annual endoscopic ultrasound and/or MRI of the pancreas starting at age 12 or 29 years younger than the earliest age of pancreatic cancer diagnosis in the family (PMID: 08144818).  Additional risk reduction and management considerations:  . Chemoprevention can reduce the risk of breast cancer in the contralateral breast in women with BRCA1 and BRCA2 mutations who have been diagnosed with breast cancer (PMID: 5631497, 02637858). . Oral contraceptive use has been shown to reduce the risk of ovarian cancer by approximately 60% in BRCA mutation carriers if taken for at least 5 years (PMID: 8502774). . Recent studies have some preliminary data that suggest PARP inhibitors may be a beneficial chemotherapeutic agent for a subset of patients with BRCA-associated breast and ovarian cancers. Clinical trials are currently in process to determine if and how these agents can be useful in the treatment of BRCA cancer patients (PMID: 12878676).  An individual's cancer risk and medical management are not determined by genetic test results alone. Overall cancer risk assessment incorporates additional factors including personal medical history, family history, as well as available genetic information that may result in a personalized plan for cancer prevention and surveillance.  FAMILY MEMBERS: It is important that all of Logan Wright relatives (both men and women) know of the presence of this gene mutation. Site-specific genetic testing can sort out who in the family is at risk and who is not. It is advantageous to know if a BRCA1 pathogenic variant is present as medical management recommendations can be implemented. At-risk relatives can be identified, allowing pursuit of a diagnostic evaluation. In addition, the available information  regarding hereditary cancer susceptibility genes is constantly evolving and more clinically relevant BRCA1 data is likely to become available in the near future. Awareness of this cancer predisposition allows patients and their providers to be vigilant in maintaining close and regular contact with their local genetics clinic in anticipation of new information, inform at-risk family members, and diligently follow condition-specific screening protocols.  Logan Wright children and siblings have a 50% chance to have inherited this mutation. We recommend they have genetic testing, as identifying the presence of this mutation would allow them to also take advantage of risk-reducing measures.   SUPPORT AND RESOURCES: If Logan Wright is interested in BRCA-specific information and support, there are two groups, Facing Our Risk (www.facingourrisk.com) and Bright Pink (www.brightpink.org) which some people have found useful. They provide opportunities to speak with other individuals from high-risk families. To locate genetic counselors in other cities, visit the website of the [employer] (ArtistMovie.se) and Secretary/administrator for a Social worker by zip code.  We encouraged Logan Wright to remain in contact with [place] on an annual basis so we can update his personal and family histories, and let him know of advances in cancer genetics that may benefit the family. Our contact number was provided. Logan Wright questions were answered to his satisfaction today, and he knows he is welcome to call anytime with additional questions.   Beryl Hornberger P. [place], [place], [facility] Licensed, Insurance risk surveyor [email or web address] phone: [phone]

## 2020-05-16 ENCOUNTER — Encounter: Payer: Self-pay | Admitting: Oncology

## 2020-05-16 ENCOUNTER — Other Ambulatory Visit: Payer: Self-pay | Admitting: Radiology

## 2020-05-17 ENCOUNTER — Ambulatory Visit (HOSPITAL_COMMUNITY)
Admission: RE | Admit: 2020-05-17 | Discharge: 2020-05-17 | Disposition: A | Discharge status: Home / Self Care | Payer: PPO | Source: Ambulatory Visit | Attending: Oncology | Admitting: Oncology

## 2020-05-17 ENCOUNTER — Encounter (HOSPITAL_COMMUNITY): Payer: Self-pay

## 2020-05-17 ENCOUNTER — Inpatient Hospital Stay: Payer: PPO | Admitting: Oncology

## 2020-05-17 ENCOUNTER — Other Ambulatory Visit: Payer: Self-pay | Admitting: Oncology

## 2020-05-17 ENCOUNTER — Other Ambulatory Visit: Payer: Self-pay

## 2020-05-17 ENCOUNTER — Encounter: Payer: Self-pay | Admitting: *Deleted

## 2020-05-17 ENCOUNTER — Inpatient Hospital Stay: Payer: PPO

## 2020-05-17 ENCOUNTER — Telehealth: Payer: Self-pay | Admitting: Oncology

## 2020-05-17 ENCOUNTER — Telehealth: Payer: Self-pay | Admitting: *Deleted

## 2020-05-17 ENCOUNTER — Inpatient Hospital Stay: Payer: PPO | Attending: Oncology

## 2020-05-17 VITALS — BP 153/73 | HR 60 | Temp 98.1°F | Resp 20

## 2020-05-17 DIAGNOSIS — T82528A Displacement of other cardiac and vascular devices and implants, initial encounter: Secondary | ICD-10-CM | POA: Diagnosis not present

## 2020-05-17 DIAGNOSIS — K219 Gastro-esophageal reflux disease without esophagitis: Secondary | ICD-10-CM | POA: Diagnosis not present

## 2020-05-17 DIAGNOSIS — Z9221 Personal history of antineoplastic chemotherapy: Secondary | ICD-10-CM | POA: Insufficient documentation

## 2020-05-17 DIAGNOSIS — Z9011 Acquired absence of right breast and nipple: Secondary | ICD-10-CM

## 2020-05-17 DIAGNOSIS — Z79899 Other long term (current) drug therapy: Secondary | ICD-10-CM | POA: Diagnosis not present

## 2020-05-17 DIAGNOSIS — Z5111 Encounter for antineoplastic chemotherapy: Secondary | ICD-10-CM | POA: Insufficient documentation

## 2020-05-17 DIAGNOSIS — N183 Chronic kidney disease, stage 3 unspecified: Secondary | ICD-10-CM | POA: Diagnosis not present

## 2020-05-17 DIAGNOSIS — C50421 Malignant neoplasm of upper-outer quadrant of right male breast: Secondary | ICD-10-CM

## 2020-05-17 DIAGNOSIS — R7303 Prediabetes: Secondary | ICD-10-CM | POA: Diagnosis not present

## 2020-05-17 DIAGNOSIS — I129 Hypertensive chronic kidney disease with stage 1 through stage 4 chronic kidney disease, or unspecified chronic kidney disease: Secondary | ICD-10-CM | POA: Insufficient documentation

## 2020-05-17 DIAGNOSIS — M109 Gout, unspecified: Secondary | ICD-10-CM | POA: Insufficient documentation

## 2020-05-17 DIAGNOSIS — C50921 Malignant neoplasm of unspecified site of right male breast: Secondary | ICD-10-CM | POA: Diagnosis not present

## 2020-05-17 DIAGNOSIS — E7800 Pure hypercholesterolemia, unspecified: Secondary | ICD-10-CM | POA: Insufficient documentation

## 2020-05-17 DIAGNOSIS — Y839 Surgical procedure, unspecified as the cause of abnormal reaction of the patient, or of later complication, without mention of misadventure at the time of the procedure: Secondary | ICD-10-CM | POA: Diagnosis not present

## 2020-05-17 DIAGNOSIS — T82598A Other mechanical complication of other cardiac and vascular devices and implants, initial encounter: Secondary | ICD-10-CM | POA: Diagnosis not present

## 2020-05-17 DIAGNOSIS — Z170 Estrogen receptor positive status [ER+]: Secondary | ICD-10-CM

## 2020-05-17 DIAGNOSIS — T859XXA Unspecified complication of internal prosthetic device, implant and graft, initial encounter: Secondary | ICD-10-CM | POA: Diagnosis not present

## 2020-05-17 DIAGNOSIS — C50411 Malignant neoplasm of upper-outer quadrant of right female breast: Secondary | ICD-10-CM | POA: Diagnosis not present

## 2020-05-17 DIAGNOSIS — T82898A Other specified complication of vascular prosthetic devices, implants and grafts, initial encounter: Secondary | ICD-10-CM | POA: Diagnosis not present

## 2020-05-17 HISTORY — PX: IR IMAGING GUIDED PORT INSERTION: IMG5740

## 2020-05-17 HISTORY — PX: IR REMOVAL TUN ACCESS W/ PORT W/O FL MOD SED: IMG2290

## 2020-05-17 LAB — PROTIME-INR
INR: 1 (ref 0.8–1.2)
Prothrombin Time: 12.8 seconds (ref 11.4–15.2)

## 2020-05-17 LAB — CBC WITH DIFFERENTIAL/PLATELET
Abs Immature Granulocytes: 0.06 10*3/uL (ref 0.00–0.07)
Basophils Absolute: 0.1 10*3/uL (ref 0.0–0.1)
Basophils Relative: 1 %
Eosinophils Absolute: 0.2 10*3/uL (ref 0.0–0.5)
Eosinophils Relative: 2 %
HCT: 47.8 % (ref 39.0–52.0)
Hemoglobin: 15.9 g/dL (ref 13.0–17.0)
Immature Granulocytes: 1 %
Lymphocytes Relative: 34 %
Lymphs Abs: 3.1 10*3/uL (ref 0.7–4.0)
MCH: 28.8 pg (ref 26.0–34.0)
MCHC: 33.3 g/dL (ref 30.0–36.0)
MCV: 86.4 fL (ref 80.0–100.0)
Monocytes Absolute: 1 10*3/uL (ref 0.1–1.0)
Monocytes Relative: 11 %
Neutro Abs: 4.7 10*3/uL (ref 1.7–7.7)
Neutrophils Relative %: 51 %
Platelets: 298 10*3/uL (ref 150–400)
RBC: 5.53 MIL/uL (ref 4.22–5.81)
RDW: 14.3 % (ref 11.5–15.5)
WBC: 9.1 10*3/uL (ref 4.0–10.5)
nRBC: 0 % (ref 0.0–0.2)

## 2020-05-17 LAB — BASIC METABOLIC PANEL
Anion gap: 10 (ref 5–15)
BUN: 23 mg/dL (ref 8–23)
CO2: 24 mmol/L (ref 22–32)
Calcium: 9 mg/dL (ref 8.9–10.3)
Chloride: 104 mmol/L (ref 98–111)
Creatinine, Ser: 1.28 mg/dL — ABNORMAL HIGH (ref 0.61–1.24)
GFR calc Af Amer: >60 mL/min (ref >60)
GFR calc non Af Amer: 54 mL/min — ABNORMAL LOW (ref >60)
Glucose, Bld: 111 mg/dL — ABNORMAL HIGH (ref 70–99)
Potassium: 4.2 mmol/L (ref 3.5–5.1)
Sodium: 138 mmol/L (ref 135–145)

## 2020-05-17 MED ORDER — CEFAZOLIN SODIUM-DEXTROSE 2-4 GM/100ML-% IV SOLN: 2.0000 g | INTRAVENOUS | Status: AC

## 2020-05-17 MED ORDER — LIDOCAINE-EPINEPHRINE 1 %-1:100000 IJ SOLN
INTRAMUSCULAR | Status: AC | PRN
  Administered 2020-05-17: 10 mL

## 2020-05-17 MED ORDER — PROCHLORPERAZINE EDISYLATE 10 MG/2ML IJ SOLN
INTRAMUSCULAR | Status: AC
  Filled 2020-05-17: qty 2

## 2020-05-17 MED ORDER — SODIUM CHLORIDE 0.9 % IV SOLN: INTRAVENOUS | Status: DC

## 2020-05-17 MED ORDER — MIDAZOLAM HCL 2 MG/2ML IJ SOLN
INTRAMUSCULAR | Status: AC | PRN
  Administered 2020-05-17 (×2): 1 mg via INTRAVENOUS

## 2020-05-17 MED ORDER — FLUOROURACIL CHEMO INJECTION 2.5 GM/50ML
600.0000 mg/m2 | Freq: Once | INTRAVENOUS | Status: AC
  Administered 2020-05-17: 1300 mg via INTRAVENOUS
  Filled 2020-05-17: qty 26

## 2020-05-17 MED ORDER — CEFAZOLIN SODIUM-DEXTROSE 2-4 GM/100ML-% IV SOLN
INTRAVENOUS | Status: AC
  Administered 2020-05-17: 2 g via INTRAVENOUS
  Filled 2020-05-17: qty 100

## 2020-05-17 MED ORDER — SODIUM CHLORIDE 0.9 % IV SOLN
10.0000 mg | Freq: Once | INTRAVENOUS | Status: AC
  Administered 2020-05-17: 10 mg via INTRAVENOUS
  Filled 2020-05-17: qty 1
  Filled 2020-05-17: qty 10

## 2020-05-17 MED ORDER — FENTANYL CITRATE (PF) 100 MCG/2ML IJ SOLN
INTRAMUSCULAR | Status: AC
  Filled 2020-05-17: qty 2

## 2020-05-17 MED ORDER — MIDAZOLAM HCL 2 MG/2ML IJ SOLN
INTRAMUSCULAR | Status: AC
  Filled 2020-05-17: qty 4

## 2020-05-17 MED ORDER — FENTANYL CITRATE (PF) 100 MCG/2ML IJ SOLN
INTRAMUSCULAR | Status: AC | PRN
  Administered 2020-05-17: 50 ug via INTRAVENOUS

## 2020-05-17 MED ORDER — SODIUM CHLORIDE 0.9 % IV SOLN
Freq: Once | INTRAVENOUS | Status: AC
  Filled 2020-05-17: qty 250

## 2020-05-17 MED ORDER — SODIUM CHLORIDE 0.9% FLUSH
10.0000 mL | INTRAVENOUS | Status: DC | PRN
  Administered 2020-05-17: 10 mL
  Filled 2020-05-17: qty 10

## 2020-05-17 MED ORDER — PALONOSETRON HCL INJECTION 0.25 MG/5ML
0.2500 mg | Freq: Once | INTRAVENOUS | Status: AC
  Administered 2020-05-17: 0.25 mg via INTRAVENOUS

## 2020-05-17 MED ORDER — METHOTREXATE SODIUM (PF) CHEMO INJECTION 250 MG/10ML
40.0000 mg/m2 | Freq: Once | INTRAMUSCULAR | Status: AC
  Administered 2020-05-17: 87.5 mg via INTRAVENOUS
  Filled 2020-05-17: qty 3.5

## 2020-05-17 MED ORDER — SODIUM CHLORIDE 0.9 % IV SOLN
600.0000 mg/m2 | Freq: Once | INTRAVENOUS | Status: AC
  Administered 2020-05-17: 1320 mg via INTRAVENOUS
  Filled 2020-05-17: qty 66

## 2020-05-17 MED ORDER — PALONOSETRON HCL INJECTION 0.25 MG/5ML
INTRAVENOUS | Status: AC
  Filled 2020-05-17: qty 5

## 2020-05-17 MED ORDER — LIDOCAINE-EPINEPHRINE 1 %-1:100000 IJ SOLN
INTRAMUSCULAR | Status: AC
  Filled 2020-05-17: qty 1

## 2020-05-17 MED ORDER — PROCHLORPERAZINE EDISYLATE 10 MG/2ML IJ SOLN
10.0000 mg | Freq: Once | INTRAMUSCULAR | Status: AC
  Administered 2020-05-17: 10 mg via INTRAVENOUS

## 2020-05-17 MED ORDER — HEPARIN SOD (PORK) LOCK FLUSH 100 UNIT/ML IV SOLN
500.0000 [IU] | Freq: Once | INTRAVENOUS | Status: AC | PRN
  Administered 2020-05-17: 500 [IU]
  Filled 2020-05-17: qty 5

## 2020-05-17 MED ORDER — HEPARIN SOD (PORK) LOCK FLUSH 100 UNIT/ML IV SOLN
INTRAVENOUS | Status: AC
  Filled 2020-05-17: qty 5

## 2020-05-17 NOTE — Telephone Encounter (Signed)
May use CBC and Bmet drawn this AM in short stay for chemo administration today per MD.

## 2020-05-17 NOTE — Telephone Encounter (Signed)
Scheduled appt per 8/5 sch msg - pt is aware of appt added.

## 2020-05-17 NOTE — Procedures (Signed)
Interventional Radiology Procedure Note  Procedure:  Revision of left chest portacatheter including removal of the existing port and placement of a new left IJ approach single lumen PowerPort.  Tip is positioned in the right atrium and catheter is ready for immediate use.   Complications: No immediate  EBL: None  Recommendations:  - Ok to shower tomorrow - Do not submerge for 7 days - Routine line care   Signed,  Criselda Peaches, MD

## 2020-05-17 NOTE — Patient Instructions (Signed)
[facility] Discharge Instructions for Patients Receiving Chemotherapy  Today you received the following chemotherapy agents Cytoxan, Methotrexate, 5FU  To help prevent nausea and vomiting after your treatment, we encourage you to take your nausea medication as prescribed.   If you develop nausea and vomiting that is not controlled by your nausea medication, call the clinic.   BELOW ARE SYMPTOMS THAT SHOULD BE REPORTED IMMEDIATELY:  *FEVER GREATER THAN 100.5 F  *CHILLS WITH OR WITHOUT FEVER  NAUSEA AND VOMITING THAT IS NOT CONTROLLED WITH YOUR NAUSEA MEDICATION  *UNUSUAL SHORTNESS OF BREATH  *UNUSUAL BRUISING OR BLEEDING  TENDERNESS IN MOUTH AND THROAT WITH OR WITHOUT PRESENCE OF ULCERS  *URINARY PROBLEMS  *BOWEL PROBLEMS  UNUSUAL RASH Items with * indicate a potential emergency and should be followed up as soon as possible.  Feel free to call the clinic should you have any questions or concerns. The clinic phone number is [phone].  Please show the [facility] at check-in to the Emergency Department and triage nurse.  Cyclophosphamide Injection What is this medicine? CYCLOPHOSPHAMIDE (sye kloe FOSS fa mide) is a chemotherapy drug. It slows the growth of cancer cells. This medicine is used to treat many types of cancer like lymphoma, myeloma, leukemia, breast cancer, and ovarian cancer, to name a few. This medicine may be used for other purposes; ask your health care provider or pharmacist if you have questions. COMMON BRAND NAME(S): Cytoxan, Neosar What should I tell my health care provider before I take this medicine? They need to know if you have any of these conditions:  heart disease  history of irregular heartbeat  infection  kidney disease  liver disease  low blood counts, like white cells, platelets, or red blood cells  on hemodialysis  recent or ongoing radiation therapy  scarring or thickening of the lungs  trouble passing  urine  an unusual or allergic reaction to cyclophosphamide, other medicines, foods, dyes, or preservatives  pregnant or trying to get pregnant  breast-feeding How should I use this medicine? This drug is usually given as an injection into a vein or muscle or by infusion into a vein. It is administered in a hospital or clinic by a specially trained health care professional. Talk to your pediatrician regarding the use of this medicine in children. Special care may be needed. Overdosage: If you think you have taken too much of this medicine contact a poison control center or emergency room at once. NOTE: This medicine is only for you. Do not share this medicine with others. What if I miss a dose? It is important not to miss your dose. Call your doctor or health care professional if you are unable to keep an appointment. What may interact with this medicine?  amphotericin B  azathioprine  certain antivirals for HIV or hepatitis  certain medicines for blood pressure, heart disease, irregular heart beat  certain medicines that treat or prevent blood clots like warfarin  certain other medicines for cancer  cyclosporine  etanercept  indomethacin  medicines that relax muscles for surgery  medicines to increase blood counts  metronidazole This list may not describe all possible interactions. Give your health care provider a list of all the medicines, herbs, non-prescription drugs, or dietary supplements you use. Also tell them if you smoke, drink alcohol, or use illegal drugs. Some items may interact with your medicine. What should I watch for while using this medicine? Your condition will be monitored carefully while you are receiving this  medicine. You may need blood work done while you are taking this medicine. Drink water or other fluids as directed. Urinate often, even at night. Some products may contain alcohol. Ask your health care professional if this medicine contains  alcohol. Be sure to tell all health care professionals you are taking this medicine. Certain medicines, like metronidazole and disulfiram, can cause an unpleasant reaction when taken with alcohol. The reaction includes flushing, headache, nausea, vomiting, sweating, and increased thirst. The reaction can last from 30 minutes to several hours. Do not become pregnant while taking this medicine or for 1 year after stopping it. Women should inform their health care professional if they wish to become pregnant or think they might be pregnant. Men should not father a child while taking this medicine and for 4 months after stopping it. There is potential for serious side effects to an unborn child. Talk to your health care professional for more information. Do not breast-feed an infant while taking this medicine or for 1 week after stopping it. This medicine has caused ovarian failure in some women. This medicine may make it more difficult to get pregnant. Talk to your health care professional if you are concerned about your fertility. This medicine has caused decreased sperm counts in some men. This may make it more difficult to father a child. Talk to your health care professional if you are concerned about your fertility. Call your health care professional for advice if you get a fever, chills, or sore throat, or other symptoms of a cold or flu. Do not treat yourself. This medicine decreases your body's ability to fight infections. Try to avoid being around people who are sick. Avoid taking medicines that contain aspirin, acetaminophen, ibuprofen, naproxen, or ketoprofen unless instructed by your health care professional. These medicines may hide a fever. Talk to your health care professional about your risk of cancer. You may be more at risk for certain types of cancer if you take this medicine. If you are going to need surgery or other procedure, tell your health care professional that you are using this  medicine. Be careful brushing or flossing your teeth or using a toothpick because you may get an infection or bleed more easily. If you have any dental work done, tell your dentist you are receiving this medicine. What side effects may I notice from receiving this medicine? Side effects that you should report to your doctor or health care professional as soon as possible:  allergic reactions like skin rash, itching or hives, swelling of the face, lips, or tongue  breathing problems  nausea, vomiting  signs and symptoms of bleeding such as bloody or black, tarry stools; red or dark brown urine; spitting up blood or brown material that looks like coffee grounds; red spots on the skin; unusual bruising or bleeding from the eyes, gums, or nose  signs and symptoms of heart failure like fast, irregular heartbeat, sudden weight gain; swelling of the ankles, feet, hands  signs and symptoms of infection like fever; chills; cough; sore throat; pain or trouble passing urine  signs and symptoms of kidney injury like trouble passing urine or change in the amount of urine  signs and symptoms of liver injury like dark yellow or brown urine; general ill feeling or flu-like symptoms; light-colored stools; loss of appetite; nausea; right upper belly pain; unusually weak or tired; yellowing of the eyes or skin Side effects that usually do not require medical attention (report to your doctor or health care  professional if they continue or are bothersome):  confusion  decreased hearing  diarrhea  facial flushing  hair loss  headache  loss of appetite  missed menstrual periods  signs and symptoms of low red blood cells or anemia such as unusually weak or tired; feeling faint or lightheaded; falls  skin discoloration This list may not describe all possible side effects. Call your doctor for medical advice about side effects. You may report side effects to FDA at 1-800-FDA-1088. Where should I keep  my medicine? This drug is given in a hospital or clinic and will not be stored at home. NOTE: This sheet is a summary. It may not cover all possible information. If you have questions about this medicine, talk to your doctor, pharmacist, or health care provider.  2020 [employer] Standard (2019-07-04 09:53:29)  Methotrexate injection What is this medicine? METHOTREXATE (METH oh TREX ate) is a chemotherapy drug used to treat cancer including breast cancer, leukemia, and lymphoma. This medicine can also be used to treat psoriasis and certain kinds of arthritis. This medicine may be used for other purposes; ask your health care provider or pharmacist if you have questions. What should I tell my health care provider before I take this medicine? They need to know if you have any of these conditions:  fluid in the stomach area or lungs  if you often drink alcohol  infection or immune system problems  kidney disease  liver disease  low blood counts, like low white cell, platelet, or red cell counts  lung disease  radiation therapy  stomach ulcers  ulcerative colitis  an unusual or allergic reaction to methotrexate, other medicines, foods, dyes, or preservatives  pregnant or trying to get pregnant  breast-feeding How should I use this medicine? This medicine is for infusion into a vein or for injection into muscle or into the spinal fluid (whichever applies). It is usually given by a health care professional in a hospital or clinic setting. In rare cases, you might get this medicine at home. You will be taught how to give this medicine. Use exactly as directed. Take your medicine at regular intervals. Do not take your medicine more often than directed. If this medicine is used for arthritis or psoriasis, it should be taken weekly, NOT daily. It is important that you put your used needles and syringes in a special sharps container. Do not put them in a trash can. If you do not have  a sharps container, call your pharmacist or healthcare provider to get one. Talk to your pediatrician regarding the use of this medicine in children. While this drug may be prescribed for children as young as 2 years for selected conditions, precautions do apply. Overdosage: If you think you have taken too much of this medicine contact a poison control center or emergency room at once. NOTE: This medicine is only for you. Do not share this medicine with others. What if I miss a dose? It is important not to miss your dose. Call your doctor or health care professional if you are unable to keep an appointment. If you give yourself the medicine and you miss a dose, talk with your doctor or health care professional. Do not take double or extra doses. What may interact with this medicine? This medicine may interact with the following medications:  acitretin  aspirin or aspirin-like medicines including salicylates  azathioprine  certain antibiotics like chloramphenicol, penicillin, tetracycline  certain medicines for stomach problems like esomeprazole, omeprazole, pantoprazole  cyclosporine  gold  hydroxychloroquine  live virus vaccines  mercaptopurine  NSAIDs, medicines for pain and inflammation, like ibuprofen or naproxen  other cytotoxic agents  penicillamine  phenylbutazone  phenytoin  probenacid  retinoids such as isotretinoin and tretinoin  steroid medicines like prednisone or cortisone  sulfonamides like sulfasalazine and trimethoprim/sulfamethoxazole  theophylline This list may not describe all possible interactions. Give your health care provider a list of all the medicines, herbs, non-prescription drugs, or dietary supplements you use. Also tell them if you smoke, drink alcohol, or use illegal drugs. Some items may interact with your medicine. What should I watch for while using this medicine? Avoid alcoholic drinks. In some cases, you may be given additional  medicines to help with side effects. Follow all directions for their use. This medicine can make you more sensitive to the sun. Keep out of the sun. If you cannot avoid being in the sun, wear protective clothing and use sunscreen. Do not use sun lamps or tanning beds/booths. You may get drowsy or dizzy. Do not drive, use machinery, or do anything that needs mental alertness until you know how this medicine affects you. Do not stand or sit up quickly, especially if you are an older patient. This reduces the risk of dizzy or fainting spells. You may need blood work done while you are taking this medicine. Call your doctor or health care professional for advice if you get a fever, chills or sore throat, or other symptoms of a cold or flu. Do not treat yourself. This drug decreases your body's ability to fight infections. Try to avoid being around people who are sick. This medicine may increase your risk to bruise or bleed. Call your doctor or health care professional if you notice any unusual bleeding. Check with your doctor or health care professional if you get an attack of severe diarrhea, nausea and vomiting, or if you sweat a lot. The loss of too much body fluid can make it dangerous for you to take this medicine. Talk to your doctor about your risk of cancer. You may be more at risk for certain types of cancers if you take this medicine. Both men and women must use effective birth control with this medicine. Do not become pregnant while taking this medicine or until at least 1 normal menstrual cycle has occurred after stopping it. Women should inform their doctor if they wish to become pregnant or think they might be pregnant. Men should not father a child while taking this medicine and for 3 months after stopping it. There is a potential for serious side effects to an unborn child. Talk to your health care professional or pharmacist for more information. Do not breast-feed an infant while taking this  medicine. What side effects may I notice from receiving this medicine? Side effects that you should report to your doctor or health care professional as soon as possible:  allergic reactions like skin rash, itching or hives, swelling of the face, lips, or tongue  back pain  breathing problems or shortness of breath  confusion  diarrhea  dry, nonproductive cough  low blood counts - this medicine may decrease the number of white blood cells, red blood cells and platelets. You may be at increased risk of infections and bleeding  mouth sores  redness, blistering, peeling or loosening of the skin, including inside the mouth  seizures  severe headaches  signs of infection - fever or chills, cough, sore throat, pain or difficulty passing urine  signs and  symptoms of bleeding such as bloody or black, tarry stools; red or dark-brown urine; spitting up blood or brown material that looks like coffee grounds; red spots on the skin; unusual bruising or bleeding from the eye, gums, or nose  signs and symptoms of kidney injury like trouble passing urine or change in the amount of urine  signs and symptoms of liver injury like dark yellow or brown urine; general ill feeling or flu-like symptoms; light-colored stools; loss of appetite; nausea; right upper belly pain; unusually weak or tired; yellowing of the eyes or skin  stiff neck  vomiting Side effects that usually do not require medical attention (report to your doctor or health care professional if they continue or are bothersome):  dizziness  hair loss  headache  stomach pain  upset stomach This list may not describe all possible side effects. Call your doctor for medical advice about side effects. You may report side effects to FDA at 1-800-FDA-1088. Where should I keep my medicine? If you are using this medicine at home, you will be instructed on how to store this medicine. Throw away any unused medicine after the expiration  date on the label. NOTE: This sheet is a summary. It may not cover all possible information. If you have questions about this medicine, talk to your doctor, pharmacist, or health care provider.  2020 [employer]/Gold Standard (2017-05-21 13:31:42)  Fluorouracil, 5-FU injection What is this medicine? FLUOROURACIL, 5-FU (flure oh YOOR a sil) is a chemotherapy drug. It slows the growth of cancer cells. This medicine is used to treat many types of cancer like breast cancer, colon or rectal cancer, pancreatic cancer, and stomach cancer. This medicine may be used for other purposes; ask your health care provider or pharmacist if you have questions. COMMON BRAND NAME(S): Adrucil What should I tell my health care provider before I take this medicine? They need to know if you have any of these conditions:  blood disorders  dihydropyrimidine dehydrogenase (DPD) deficiency  infection (especially a virus infection such as chickenpox, cold sores, or herpes)  kidney disease  liver disease  malnourished, poor nutrition  recent or ongoing radiation therapy  an unusual or allergic reaction to fluorouracil, other chemotherapy, other medicines, foods, dyes, or preservatives  pregnant or trying to get pregnant  breast-feeding How should I use this medicine? This drug is given as an infusion or injection into a vein. It is administered in a hospital or clinic by a specially trained health care professional. Talk to your pediatrician regarding the use of this medicine in children. Special care may be needed. Overdosage: If you think you have taken too much of this medicine contact a poison control center or emergency room at once. NOTE: This medicine is only for you. Do not share this medicine with others. What if I miss a dose? It is important not to miss your dose. Call your doctor or health care professional if you are unable to keep an appointment. What may interact with this  medicine?  allopurinol  cimetidine  dapsone  digoxin  hydroxyurea  leucovorin  levamisole  medicines for seizures like ethotoin, fosphenytoin, phenytoin  medicines to increase blood counts like filgrastim, pegfilgrastim, sargramostim  medicines that treat or prevent blood clots like warfarin, enoxaparin, and dalteparin  methotrexate  metronidazole  pyrimethamine  some other chemotherapy drugs like busulfan, cisplatin, estramustine, vinblastine  trimethoprim  trimetrexate  vaccines Talk to your doctor or health care professional before taking any of these medicines:  acetaminophen  aspirin  ibuprofen  ketoprofen  naproxen This list may not describe all possible interactions. Give your health care provider a list of all the medicines, herbs, non-prescription drugs, or dietary supplements you use. Also tell them if you smoke, drink alcohol, or use illegal drugs. Some items may interact with your medicine. What should I watch for while using this medicine? Visit your doctor for checks on your progress. This drug may make you feel generally unwell. This is not uncommon, as chemotherapy can affect healthy cells as well as cancer cells. Report any side effects. Continue your course of treatment even though you feel ill unless your doctor tells you to stop. In some cases, you may be given additional medicines to help with side effects. Follow all directions for their use. Call your doctor or health care professional for advice if you get a fever, chills or sore throat, or other symptoms of a cold or flu. Do not treat yourself. This drug decreases your body's ability to fight infections. Try to avoid being around people who are sick. This medicine may increase your risk to bruise or bleed. Call your doctor or health care professional if you notice any unusual bleeding. Be careful brushing and flossing your teeth or using a toothpick because you may get an infection or bleed  more easily. If you have any dental work done, tell your dentist you are receiving this medicine. Avoid taking products that contain aspirin, acetaminophen, ibuprofen, naproxen, or ketoprofen unless instructed by your doctor. These medicines may hide a fever. Do not become pregnant while taking this medicine. Women should inform their doctor if they wish to become pregnant or think they might be pregnant. There is a potential for serious side effects to an unborn child. Talk to your health care professional or pharmacist for more information. Do not breast-feed an infant while taking this medicine. Men should inform their doctor if they wish to father a child. This medicine may lower sperm counts. Do not treat diarrhea with over the counter products. Contact your doctor if you have diarrhea that lasts more than 2 days or if it is severe and watery. This medicine can make you more sensitive to the sun. Keep out of the sun. If you cannot avoid being in the sun, wear protective clothing and use sunscreen. Do not use sun lamps or tanning beds/booths. What side effects may I notice from receiving this medicine? Side effects that you should report to your doctor or health care professional as soon as possible:  allergic reactions like skin rash, itching or hives, swelling of the face, lips, or tongue  low blood counts - this medicine may decrease the number of white blood cells, red blood cells and platelets. You may be at increased risk for infections and bleeding.  signs of infection - fever or chills, cough, sore throat, pain or difficulty passing urine  signs of decreased platelets or bleeding - bruising, pinpoint red spots on the skin, black, tarry stools, blood in the urine  signs of decreased red blood cells - unusually weak or tired, fainting spells, lightheadedness  breathing problems  changes in vision  chest pain  mouth sores  nausea and vomiting  pain, swelling, redness at site  where injected  pain, tingling, numbness in the hands or feet  redness, swelling, or sores on hands or feet  stomach pain  unusual bleeding Side effects that usually do not require medical attention (report to your doctor or health care professional if they  continue or are bothersome):  changes in finger or toe nails  diarrhea  dry or itchy skin  hair loss  headache  loss of appetite  sensitivity of eyes to the light  stomach upset  unusually teary eyes This list may not describe all possible side effects. Call your doctor for medical advice about side effects. You may report side effects to FDA at 1-800-FDA-1088. Where should I keep my medicine? This drug is given in a hospital or clinic and will not be stored at home. NOTE: This sheet is a summary. It may not cover all possible information. If you have questions about this medicine, talk to your doctor, pharmacist, or health care provider.  2020 [employer]/Gold Standard (2008-02-02 13:53:16)

## 2020-05-17 NOTE — Discharge Instructions (Signed)

## 2020-05-17 NOTE — H&P (Signed)
Chief Complaint: Patient was seen in consultation today for a port revision  Referring Physician(s): Chauncey Cruel  Supervising Physician: Jacqulynn Cadet  Patient Status: [place]  History of Present Illness: Logan Wright is a 74 y.o. male with a medical history that includes HTN, CKD III, gynecomastia  and recently diagnosed right breast cancer. He underwent a right breast mastectomy 03/16/20 and has been receiving adjuvant chemotherapy since early July via a left subclavian port-a-cath inserted by general surgery 05/08/20. During his first chemotherapy session on 05/10/20 the oncology team was unable to aspirate blood and Cathflo administration yielded little blood return. Logan Wright presented to IR 05/14/20 for a port evaluation.  CV Line Injection 05/14/20: IMPRESSION: 1. Left subclavian approach single-lumen power injectable port catheter. The catheter tip is present within the distal third of the left innominate vein. The catheter tubing lies along the inferior wall of the vein and there is early development of a small thrombus or fibrin sheath at the catheter tip inhibiting aspiration. 2. Ultimately, revision for a longer catheter with the tip terminating in the distal SVC, cavoatrial junction or upper right atrium would be required for optimal catheter function.  Interventional Radiology has been asked to evaluate this patient for an image-guided port-a-cath revision.    Past Medical History:  Diagnosis Date   Breast cancer, right ([place])    CKD (chronic kidney disease), stage III    Colon adenoma 2015   Diverticulosis    DENIES    DJD (degenerative joint disease)    ED (erectile dysfunction)    Elevated blood pressure 2007-2008   Esophageal reflux    GERD (gastroesophageal reflux disease)    Gout    Gynecomastia    LEFT   Hearing loss    HTN (hypertension)    Hypercholesteremia    Non-cardiac chest pain    OA (osteoarthritis)    LEFT  SHOULDER   Onychomycosis of foot with other complication    Pre-diabetes    Seborrhea    Severe frontal headaches    RESOLVED    Vertigo    RESOLVED    Wears glasses    Wears hearing aid     Past Surgical History:  Procedure Laterality Date   COLONSCOPY      HIP ARTHROPLASTY Bilateral    AT [place]    IR CV LINE INJECTION  05/14/2020   KNEE ARTHROSCOPY     UNSURE WHICH KNEE    MASTECTOMY W/ SENTINEL NODE BIOPSY Right 03/16/2020   Procedure: RIGHT BREAST MASTECTOMY WITH SENTINEL LYMPH NODE BIOPSY;  Surgeon: [name], MD;  Location: [facility];  Service: General;  Laterality: Right;   PORTACATH PLACEMENT Left 05/08/2020   Procedure: INSERTION PORT-A-CATH WITH ULTRASOUND;  Surgeon: [name], MD;  Location: [facility];  Service: General;  Laterality: Left;  LMA   TOTAL KNEE ARTHROPLASTY Left 11/23/2018   Procedure: TOTAL KNEE ARTHROPLASTY;  Surgeon: Paralee Cancel, MD;  Location: [facility];  Service: Orthopedics;  Laterality: Left;  70 mins    Allergies: Patient has no known allergies.  Medications: Prior to Admission medications   Medication Sig Start Date End Date Taking? Authorizing Provider  allopurinol (ZYLOPRIM) 100 MG tablet Take 200 mg by mouth daily.    [provider]  atorvastatin (LIPITOR) 40 MG tablet Take 40 mg by mouth daily.    [provider]  dexamethasone (DECADRON) 4 MG tablet Take 2 tablets (8 mg total) by mouth daily. Start the day  after chemotherapy for 2 days. Take with food. 04/28/20   Magrinat, [name], MD  GLUCOSAMINE-CHONDROITIN PO Take 2 tablets by mouth daily.    [provider]  HYDROcodone-acetaminophen (NORCO/VICODIN) 5-325 MG tablet Take 1 tablet by mouth every 4 (four) hours as needed for moderate pain. Patient not taking: Reported on 04/30/2020 03/17/20   [name], MD  lidocaine-prilocaine (EMLA) cream Apply to affected area once Patient taking differently: Apply 1 application  topically daily as needed (port access).  04/28/20   Magrinat, [name], MD  LORazepam (ATIVAN) 0.5 MG tablet Take 1 tablet (0.5 mg total) by mouth at bedtime as needed (Nausea or vomiting). 04/28/20   Magrinat, [name], MD  losartan-hydrochlorothiazide (HYZAAR) 50-12.5 MG tablet TAKE 1 TABLET BY MOUTH DAILY. Patient taking differently: Take 1 tablet by mouth daily.  01/10/16   Belva Crome, MD  Multiple Vitamin (MULTIVITAMIN) tablet Take 1 tablet by mouth daily.     [provider]  Omega-3 Fatty Acids (FISH OIL ULTRA) 1400 MG CAPS Take 1,400-2,800 mg by mouth See admin instructions. Take 1400 mg in the morning and 2800 mg in the evening    [provider]  Potassium 99 MG TABS Take 99 mg by mouth daily.     [provider]     Family History  Problem Relation Age of Onset   Heart attack Mother    Stroke Mother    Heart failure Father        63   Hypertension Neg Hx     Social History   Socioeconomic History   Marital status: Married    Spouse name: Not on file   Number of children: Not on file   Years of education: Not on file   Highest education level: Not on file  Occupational History   Not on file  Tobacco Use   Smoking status: Never Smoker   Smokeless tobacco: Never Used  Vaping Use   Vaping Use: Never used  Substance and Sexual Activity   Alcohol use: No    Alcohol/week: 0.0 standard drinks   Drug use: No   Sexual activity: Not on file  Other Topics Concern   Not on file  Social History Narrative   Not on file   Social Determinants of Health   Financial Resource Strain:    Difficulty of Paying Living Expenses:   Food Insecurity:    Worried About Charity fundraiser in the Last Year:    Arboriculturist in the Last Year:   Transportation Needs:    Film/video editor (Medical):    Lack of Transportation (Non-Medical):   Physical Activity:    Days of Exercise per Week:    Minutes of Exercise per Session:    Stress:    Feeling of Stress :   Social Connections:    Frequency of Communication with Friends and Family:    Frequency of Social Gatherings with Friends and Family:    Attends Religious Services:    Active Member of Clubs or Organizations:    Attends Archivist Meetings:    Marital Status:     Review of Systems: A 12 point ROS discussed and pertinent positives are indicated in the HPI above.  All other systems are negative.  Review of Systems  Constitutional: Negative for appetite change and fatigue.  Respiratory: Negative for cough and shortness of breath.   Cardiovascular: Negative for chest pain and leg swelling.  Gastrointestinal: Negative for abdominal  pain, diarrhea, nausea and vomiting.  Musculoskeletal: Negative for back pain.  Neurological: Negative for headaches.  Hematological: Does not bruise/bleed easily.    Vital Signs: BP (!) 160/78    Pulse 65    Temp 98.1 F (36.7 C) (Oral)    Resp 20    SpO2 99%   Physical Exam Constitutional:      General: He is not in acute distress. HENT:     Mouth/Throat:     Mouth: Mucous membranes are moist.     Pharynx: Oropharynx is clear.  Cardiovascular:     Rate and Rhythm: Normal rate and regular rhythm.     Pulses: Normal pulses.     Heart sounds: Normal heart sounds.     Comments: Left upper chest port-a-cath. Incision is healed, some tenderness surrounding the site. No drainage observed.  Pulmonary:     Effort: Pulmonary effort is normal.     Breath sounds: Normal breath sounds.  Abdominal:     General: Bowel sounds are normal.     Palpations: Abdomen is soft.     Tenderness: There is no abdominal tenderness.  Musculoskeletal:        General: Normal range of motion.     Cervical back: Normal range of motion.  Skin:    General: Skin is warm and dry.     Comments: Right mastectomy surgical scar. Some erythema and tenderness. No drainage observed.   Neurological:     Mental Status: He is alert and  oriented to person, place, and time.     Imaging: IR CV Line Injection  Result Date: 05/14/2020 INDICATION: 74 year old male with a history of malignant neoplasm of the upper-outer quadrant of the right breast, ER positive. He has a left subclavian port catheter which was placed by general surgery on 05/08/2020. Unfortunately, there having difficulty with aspiration of the port catheter at the infusion center and the patient presents for further evaluation. EXAM: CENTRAL VENOUS CATHETER MEDICATIONS: None. ANESTHESIA/SEDATION: None FLUOROSCOPY TIME:  Fluoroscopy Time: 0 minutes 18 seconds (86 mGy). COMPLICATIONS: None immediate. PROCEDURE: The catheter was imaged with fluoroscopic imaging. This is a left subclavian approach single-lumen power injectable port catheter. There is some redundancy of the catheter tubing in the subcutaneous tissues before entering the subclavian vein. Additionally, the catheter tip overlies the midline, likely within the innominate vein. Catheter injection was then performed under digital subtraction angiography. The catheter tip is in the distal third of the innominate vein. A filling defect is present at the catheter tip consistent with either a small thrombus or developing fibrin sheath. Aspiration was attempted but was unsuccessful. The catheter was flushed. IMPRESSION: 1. Left subclavian approach single-lumen power injectable port catheter. The catheter tip is present within the distal third of the left innominate vein. The catheter tubing lies along the inferior wall of the vein and there is early development of a small thrombus or fibrin sheath at the catheter tip inhibiting aspiration. 2. Ultimately, revision for a longer catheter with the tip terminating in the distal SVC, cavoatrial junction or upper right atrium would be required for optimal catheter function. These results were called by telephone at the time of interpretation on 05/14/2020 at 11:46 am to provider Dr.  Jana Hakim, who verbally acknowledged these results. Electronically Signed   By: Jacqulynn Cadet M.D.   On: 05/14/2020 11:47   DG CHEST PORT 1 VIEW  Result Date: 05/08/2020 CLINICAL DATA:  Central line placement EXAM: PORTABLE CHEST 1 VIEW COMPARISON:  01/24/2018 FINDINGS: Left  Port-A-Cath placement with the tip in the upper SVC. No pneumothorax. Heart is normal size. Left base atelectasis. Right lung clear. No effusions or acute bony abnormality. IMPRESSION: Left subclavian Port-A-Cath placement with the tip in the upper SVC. No pneumothorax. Left base atelectasis. Electronically Signed   By: Rolm Baptise M.D.   On: 05/08/2020 10:17   DG Fluoro Guide CV Line-No Report  Result Date: 05/08/2020 Fluoroscopy was utilized by the requesting physician.  No radiographic interpretation.    Labs:  CBC: Recent Labs    03/27/20 1538 04/30/20 1547 05/17/20 0750  WBC 10.7* 9.0 9.1  HGB 14.1 15.8 15.9  HCT 43.3 47.0 47.8  PLT 293 276 298    COAGS: Recent Labs    05/17/20 0750  INR 1.0    BMP: Recent Labs    03/13/20 1220 03/27/20 1538 04/30/20 1547 05/17/20 0750  NA 137 142 140 138  K 4.3 4.1 4.1 4.2  CL 101 107 107 104  CO2 23 24 23 24   GLUCOSE 140* 119* 99 111*  BUN 19 21 24* 23  CALCIUM 9.4 9.1 9.3 9.0  CREATININE 1.24 1.24 1.59* 1.28*  GFRNONAA 57* 57* 42* 54*  GFRAA >60 >60 48* >60    LIVER FUNCTION TESTS: Recent Labs    03/27/20 1538 04/30/20 1547  BILITOT 0.5 0.5  AST 23 30  ALT 28 38  ALKPHOS 80 75  PROT 6.7 7.2  ALBUMIN 3.1* 3.5    TUMOR MARKERS: No results for input(s): AFPTM, CEA, CA199, CHROMGRNA in the last 8760 hours.  Assessment and Plan:  Right breast cancer; malfunctioning port-a-cath: Logan Wright, 74 year old male, presents today to the [facility] department for an image-guided port-a-cath revision.   Risks and benefits of image-guided Port-a-catheter revision were discussed with the patient including, but not  limited to bleeding, infection, pneumothorax, or fibrin sheath development and need for additional procedures. All of the patient's questions were answered, patient is agreeable to proceed.  Patient has been NPO. Labs and vitals have been reviewed.   Consent signed and in chart.   Thank you for this interesting consult.  I greatly enjoyed Logan Wright and look forward to participating in their care.  A copy of this report was sent to the requesting provider on this date.  Electronically Signed: Soyla Dryer, AGACNP-BC [id number] 05/17/2020, 8:33 AM   I spent a total of  30 Minutes   in face to face in clinical consultation, greater than 50% of which was counseling/coordinating care for image-guided port-a-cath revision

## 2020-05-17 NOTE — Progress Notes (Signed)
Per Billy Coast RN, Dr. Jana Hakim is ok with using today's BMP & CBC for tx.

## 2020-05-18 ENCOUNTER — Telehealth: Payer: Self-pay | Admitting: *Deleted

## 2020-05-23 ENCOUNTER — Other Ambulatory Visit: Payer: Self-pay

## 2020-05-23 ENCOUNTER — Inpatient Hospital Stay: Payer: PPO

## 2020-05-23 ENCOUNTER — Inpatient Hospital Stay (HOSPITAL_BASED_OUTPATIENT_CLINIC_OR_DEPARTMENT_OTHER): Payer: PPO | Admitting: Adult Health

## 2020-05-23 ENCOUNTER — Encounter: Payer: Self-pay | Admitting: *Deleted

## 2020-05-23 VITALS — BP 143/69 | HR 75 | Temp 96.9°F | Resp 17 | Ht 67.0 in | Wt 224.2 lb

## 2020-05-23 DIAGNOSIS — Z95828 Presence of other vascular implants and grafts: Secondary | ICD-10-CM

## 2020-05-23 DIAGNOSIS — C50421 Malignant neoplasm of upper-outer quadrant of right male breast: Secondary | ICD-10-CM

## 2020-05-23 DIAGNOSIS — Z170 Estrogen receptor positive status [ER+]: Secondary | ICD-10-CM

## 2020-05-23 DIAGNOSIS — Z5111 Encounter for antineoplastic chemotherapy: Secondary | ICD-10-CM | POA: Diagnosis not present

## 2020-05-23 LAB — CBC WITH DIFFERENTIAL/PLATELET
Abs Immature Granulocytes: 0.06 10*3/uL (ref 0.00–0.07)
Basophils Absolute: 0 10*3/uL (ref 0.0–0.1)
Basophils Relative: 0 %
Eosinophils Absolute: 0.1 10*3/uL (ref 0.0–0.5)
Eosinophils Relative: 1 %
HCT: 45 % (ref 39.0–52.0)
Hemoglobin: 14.9 g/dL (ref 13.0–17.0)
Immature Granulocytes: 1 %
Lymphocytes Relative: 18 %
Lymphs Abs: 1.8 10*3/uL (ref 0.7–4.0)
MCH: 27.8 pg (ref 26.0–34.0)
MCHC: 33.1 g/dL (ref 30.0–36.0)
MCV: 84 fL (ref 80.0–100.0)
Monocytes Absolute: 0.4 10*3/uL (ref 0.1–1.0)
Monocytes Relative: 4 %
Neutro Abs: 7.7 10*3/uL (ref 1.7–7.7)
Neutrophils Relative %: 76 %
Platelets: 203 10*3/uL (ref 150–400)
RBC: 5.36 MIL/uL (ref 4.22–5.81)
RDW: 13.5 % (ref 11.5–15.5)
WBC: 10.1 10*3/uL (ref 4.0–10.5)
nRBC: 0 % (ref 0.0–0.2)

## 2020-05-23 LAB — COMPREHENSIVE METABOLIC PANEL
ALT: 39 U/L (ref 0–44)
AST: 26 U/L (ref 15–41)
Albumin: 3.1 g/dL — ABNORMAL LOW (ref 3.5–5.0)
Alkaline Phosphatase: 69 U/L (ref 38–126)
Anion gap: 10 (ref 5–15)
BUN: 34 mg/dL — ABNORMAL HIGH (ref 8–23)
CO2: 26 mmol/L (ref 22–32)
Calcium: 9 mg/dL (ref 8.9–10.3)
Chloride: 104 mmol/L (ref 98–111)
Creatinine, Ser: 1.23 mg/dL (ref 0.61–1.24)
GFR calc Af Amer: >60 mL/min (ref >60)
GFR calc non Af Amer: 57 mL/min — ABNORMAL LOW (ref >60)
Glucose, Bld: 99 mg/dL (ref 70–99)
Potassium: 4 mmol/L (ref 3.5–5.1)
Sodium: 140 mmol/L (ref 135–145)
Total Bilirubin: 0.7 mg/dL (ref 0.3–1.2)
Total Protein: 6.3 g/dL — ABNORMAL LOW (ref 6.5–8.1)

## 2020-05-23 MED ORDER — HEPARIN SOD (PORK) LOCK FLUSH 100 UNIT/ML IV SOLN
500.0000 [IU] | Freq: Once | INTRAVENOUS | Status: AC
  Administered 2020-05-23: 500 [IU] via INTRAVENOUS
  Filled 2020-05-23: qty 5

## 2020-05-23 MED ORDER — SODIUM CHLORIDE 0.9% FLUSH
10.0000 mL | INTRAVENOUS | Status: DC | PRN
  Administered 2020-05-23: 10 mL via INTRAVENOUS
  Filled 2020-05-23: qty 10

## 2020-05-23 NOTE — Progress Notes (Signed)
[facility]  [phone] [phone]     ID: Logan Wright DOB: 03/26/1946  MR#: [id number]  KXF#:[id number]  Patient Care Team: [name], MD as PCP - General (Family Medicine) Magrinat, Logan Dad, MD as Consulting Physician (Oncology) [name], MD as Consulting Physician (General Surgery) Kyung Rudd, MD as Consulting Physician (Radiation Oncology) Lavonna Monarch, MD as Consulting Physician (Dermatology) Paralee Cancel, MD as Consulting Physician (Orthopedic Surgery) Mauro Kaufmann, RN as Oncology Nurse Navigator Rockwell Germany, RN as Oncology Nurse [place], NP OTHER MD:  CHIEF COMPLAINT: estrogen receptor positive breast cancer  CURRENT TREATMENT: Adjuvant chemotherapy   INTERVAL HISTORY: Logan Wright returns today for follow up of his estrogen receptor positive breast cancer unaccompanied.  He is here to start adjuvant chemotherapy with CMF.  This treatment is given on day 1 of a 21 day cycle x 8.  He was going to receive treatment last week, however his port had retracted from its original position.  He required a revision, and had this completed on 05/17/2020.  He received treatment with CMF that day as well.    REVIEW OF SYSTEMS: Logan Wright notes that he tolerated his first cycle of treatment quite well.  He denies nausea, vomiting, constipation, diarrhea, mucositis, neuropathy.  He has had some difficulty sleeping and has taken Lorazepam a couple of nights if needed.  This helps him fall asleep, and he still wakes up in the middle of the night to urinate, however he is able to fall back to sleep.    Logan Wright continues to exercise daily with walking and rides a bike intermittently as well.  He is mildly fatigued, and does take an occasional afternoon nap.  He has had no fever, chills, chest pain, palpitations, cough, shortness of breath, or any other concerns.  A detailed ROS was otherwise non contributory.    HISTORY OF  CURRENT ILLNESS: From the original intake note:  Logan Wright palpated a right breast mass sometime late 2020.  He had a prior left breast mass in the 1990s which was found to be a fat deposit and he assumed this would be the same.  He did mention it to Dr. Jacelyn Grip at the time of their regular physical and he set Logan Wright up for bilateral diagnostic mammography with tomography and right breast ultrasonography at [facility] on 02/21/2020 showing: breast density category B; 3.2 cm irregular mass at 11 o'clock in the right breast; normal right axillary lymph nodes.  Accordingly on 03/02/2020 the patient proceeded to biopsy of the right breast area in question. The pathology from this procedure ([id number]) showed: invasive ductal carcinoma, grade 2-3. Prognostic indicators significant for: estrogen receptor, 90% positive with strong staining intensity and progesterone receptor, 5% positive with moderate staining intensity. Proliferation marker Ki67 at 15%. HER2 equivocal by immunohistochemistry (2+), but negative by fluorescent in situ hybridization with a signals ratio 1.35 and number per cell 3.1.  He met with Dr. Lisbeth Renshaw on 03/16/2020 to discuss adjuvant radiation therapy. Per consultation note,  postmastectomy radiation was felt to be unlikely.  He proceeded to right mastectomy on 03/16/2020 under Dr. [name]. Pathology from the procedure [id number]) showed: invasive ductal carcinoma, grade 2, 3.2 cm; margins not involved.  All three sentinel lymph nodes removed were negative for carcinoma (0/3).  The patient's subsequent history is as detailed below.   PAST MEDICAL HISTORY: Past Medical History:  Diagnosis Date  . Breast cancer, right ([place])   . CKD (chronic  kidney disease), stage III   . Colon adenoma 2015  . Diverticulosis    DENIES   . DJD (degenerative joint disease)   . ED (erectile dysfunction)   . Elevated blood pressure 2007-2008  . Esophageal reflux   . GERD (gastroesophageal  reflux disease)   . Gout   . Gynecomastia    LEFT  . Hearing loss   . HTN (hypertension)   . Hypercholesteremia   . Non-cardiac chest pain   . OA (osteoarthritis)    LEFT SHOULDER  . Onychomycosis of foot with other complication   . Pre-diabetes   . Seborrhea   . Severe frontal headaches    RESOLVED   . Vertigo    RESOLVED   . Wears glasses   . Wears hearing aid     PAST SURGICAL HISTORY: Past Surgical History:  Procedure Laterality Date  . COLONSCOPY     . HIP ARTHROPLASTY Bilateral    AT [facility]   . IR CV LINE INJECTION  05/14/2020  . IR IMAGING GUIDED PORT INSERTION  05/17/2020  . IR REMOVAL TUN ACCESS W/ PORT W/O FL MOD SED  05/17/2020  . KNEE ARTHROSCOPY     UNSURE WHICH KNEE   . MASTECTOMY W/ SENTINEL NODE BIOPSY Right 03/16/2020   Procedure: RIGHT BREAST MASTECTOMY WITH SENTINEL LYMPH NODE BIOPSY;  Surgeon: [name], MD;  Location: [facility];  Service: General;  Laterality: Right;  . PORTACATH PLACEMENT Left 05/08/2020   Procedure: INSERTION PORT-A-CATH WITH ULTRASOUND;  Surgeon: [name], MD;  Location: [facility];  Service: General;  Laterality: Left;  LMA  . TOTAL KNEE ARTHROPLASTY Left 11/23/2018   Procedure: TOTAL KNEE ARTHROPLASTY;  Surgeon: Paralee Cancel, MD;  Location: [facility];  Service: Orthopedics;  Laterality: Left;  70 mins    FAMILY HISTORY: Family History  Problem Relation Age of Onset  . Heart attack Mother   . Stroke Mother   . Heart failure Father        16  . Hypertension Neg Hx   The patient is of Ashkenazi background.  His father died at age 79 and his mother at age 27.  On the father's side, the paternal grandfather died young from unclear causes.  The paternal grandmother did not have cancer.  The patient's father was a single child.  On the maternal side the patient's maternal grandmother died young again from unclear reasons but the paternal grandfather and the 3 maternal aunts were all died in old age with no  cancer.  The patient also knows of no cancer in his cousins.  He himself has 1 brother with no history of cancer, no sisters.   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  Previously he used to on [employer] which she sold in 1999.  He is a IT sales professional.  His wife of 1+ years Logan Wright is very active in the community.  Their daughter Logan Wright his Engineer, building services for a [facility] psychiatric group.  She lives in [place].  Son Logan Wright lives in [place] by himself.  Patient has 3 grandchildren.  He attends [employer]    ADVANCED DIRECTIVES: In the absence of any documentation to the contrary, the patient's spouse is his HCPOA.    HEALTH MAINTENANCE: Social History   Tobacco Use  . Smoking status: Never Smoker  . Smokeless tobacco: Never Used  Vaping  Use  . Vaping Use: Never used  Substance Use Topics  . Alcohol use: No    Alcohol/week: 0.0 standard drinks  . Drug use: No     Colonoscopy: [facility]  Bone density: -  PSA:   No Known Allergies  Current Outpatient Medications  Medication Sig Dispense Refill  . allopurinol (ZYLOPRIM) 100 MG tablet Take 200 mg by mouth daily.    Marland Kitchen atorvastatin (LIPITOR) 40 MG tablet Take 40 mg by mouth daily.    Marland Kitchen dexamethasone (DECADRON) 4 MG tablet Take 2 tablets (8 mg total) by mouth daily. Start the day after chemotherapy for 2 days. Take with food. 30 tablet 1  . GLUCOSAMINE-CHONDROITIN PO Take 2 tablets by mouth daily.    Marland Kitchen HYDROcodone-acetaminophen (NORCO/VICODIN) 5-325 MG tablet Take 1 tablet by mouth every 4 (four) hours as needed for moderate pain. 25 tablet 0  . lidocaine-prilocaine (EMLA) cream Apply to affected area once (Patient taking differently: Apply 1 application topically daily as needed (port access). ) 30 g 3  . LORazepam (ATIVAN) 0.5 MG tablet Take 1 tablet (0.5 mg total) by mouth at bedtime as  needed (Nausea or vomiting). 20 tablet 0  . losartan-hydrochlorothiazide (HYZAAR) 50-12.5 MG tablet TAKE 1 TABLET BY MOUTH DAILY. (Patient taking differently: Take 1 tablet by mouth daily. ) 30 tablet 5  . Multiple Vitamin (MULTIVITAMIN) tablet Take 1 tablet by mouth daily.     . Omega-3 Fatty Acids (FISH OIL ULTRA) 1400 MG CAPS Take 1,400-2,800 mg by mouth See admin instructions. Take 1400 mg in the morning and 2800 mg in the evening    . Potassium 99 MG TABS Take 99 mg by mouth daily.      No current facility-administered medications for this visit.    OBJECTIVE: White man in no acute distress  Vitals:   05/23/20 1142  BP: (!) 143/69  Pulse: 75  Resp: 17  Temp: (!) 96.9 F (36.1 C)  SpO2: 98%     Body mass index is 35.11 kg/m.   Wt Readings from Last 3 Encounters:  05/23/20 224 lb 3.2 oz (101.7 kg)  05/10/20 (!) 227 lb 6.4 oz (103.1 kg)  05/08/20 (!) 220 lb (99.8 kg)      ECOG FS:1 - Symptomatic but completely ambulatory  GENERAL: Patient is a well appearing male in no acute distress HEENT:  Sclerae anicteric. Mask in place.  Neck is supple.  NODES:  No cervical, supraclavicular, or axillary lymphadenopathy palpated.  BREAST EXAM:  S/p right mastectomy, healing well.  nO sign of infection LUNGS:  Clear to auscultation bilaterally.  No wheezes or rhonchi. HEART:  Regular rate and rhythm. No murmur appreciated. ABDOMEN:  Soft, nontender.  Positive, normoactive bowel sounds. No organomegaly palpated. MSK:  No focal spinal tenderness to palpation. Full range of motion bilaterally in the upper extremities. EXTREMITIES:  No peripheral edema.   SKIN:  Clear with no obvious rashes or skin changes. No nail dyscrasia. NEURO:  Nonfocal. Well oriented.  Appropriate affect.    LAB RESULTS:  CMP     Component Value Date/Time   NA 138 05/17/2020 0750   K 4.2 05/17/2020 0750   CL 104 05/17/2020 0750   CO2 24 05/17/2020 0750   GLUCOSE 111 (H) 05/17/2020 0750   BUN 23 05/17/2020  0750   CREATININE 1.28 (H) 05/17/2020 0750   CREATININE 1.47 (H) 08/24/2015 1257   CALCIUM 9.0 05/17/2020 0750   PROT 7.2 04/30/2020 1547   ALBUMIN 3.5 04/30/2020 1547  AST 30 04/30/2020 1547   ALT 38 04/30/2020 1547   ALKPHOS 75 04/30/2020 1547   BILITOT 0.5 04/30/2020 1547   GFRNONAA 54 (L) 05/17/2020 0750   GFRAA >60 05/17/2020 0750    No results found for: TOTALPROTELP, ALBUMINELP, A1GS, A2GS, BETS, BETA2SER, GAMS, MSPIKE, SPEI  Lab Results  Component Value Date   WBC 10.1 05/23/2020   NEUTROABS 7.7 05/23/2020   HGB 14.9 05/23/2020   HCT 45.0 05/23/2020   MCV 84.0 05/23/2020   PLT 203 05/23/2020    No results found for: LABCA2  No components found for: HXTAVW979  Recent Labs  Lab 05/17/20 0750  INR 1.0    No results found for: LABCA2  No results found for: YIA165  No results found for: VVZ482  No results found for: LMB867  No results found for: CA2729  No components found for: HGQUANT  No results found for: CEA1 / No results found for: CEA1   No results found for: AFPTUMOR  No results found for: CHROMOGRNA  No results found for: KPAFRELGTCHN, LAMBDASER, KAPLAMBRATIO (kappa/lambda light chains)  No results found for: HGBA, HGBA2QUANT, HGBFQUANT, HGBSQUAN (Hemoglobinopathy evaluation)   No results found for: LDH  No results found for: IRON, TIBC, IRONPCTSAT (Iron and TIBC)  No results found for: FERRITIN  Urinalysis No results found for: COLORURINE, APPEARANCEUR, LABSPEC, PHURINE, GLUCOSEU, HGBUR, BILIRUBINUR, KETONESUR, PROTEINUR, UROBILINOGEN, NITRITE, LEUKOCYTESUR   STUDIES: IR REMOVAL TUN ACCESS W/ PORT W/O FL MOD SED  Result Date: 05/17/2020 INDICATION: 74 year old male with male breast cancer of the upper outer quadrant of the right breast. He has a surgically placed left subclavian port catheter which was recently placed on 05/14/2020. Unfortunately, the catheter retracted back into the left brachiocephalic vein resulting in  apposition of the catheter tubing along the inferior wall of the vein and early port catheter malfunction. Patient presents today for port catheter revision. He has chemotherapy immediately following revision and so we will leave the new port accessed. EXAM: IR IMAGING GUIDED PORT INSERTION; REMOVAL PORT MEDICATIONS: 2 g Ancef; The antibiotic was administered within an appropriate time interval prior to skin puncture. ANESTHESIA/SEDATION: Moderate (conscious) sedation was employed during this procedure. A total of Versed 3 mg and Fentanyl 100 mcg was administered intravenously. Moderate Sedation Time: 23 minutes. The patient's level of consciousness and vital signs were monitored continuously by radiology nursing throughout the procedure under my direct supervision. FLUOROSCOPY TIME:  Fluoroscopy Time: 0 minutes 12 seconds (28 mGy). COMPLICATIONS: None immediate. PROCEDURE: Informed written consent was obtained from the patient after a thorough discussion of the procedural risks, benefits and alternatives. All questions were addressed. Maximal Sterile Barrier Technique was utilized including caps, mask, sterile gowns, sterile gloves, sterile drape, hand hygiene and skin antiseptic. A timeout was performed prior to the initiation of the procedure. The left internal jugular vein was interrogated with ultrasound and found to be widely patent. An image was obtained and stored for the medical record. Local anesthesia was attained by infiltration with 1% lidocaine. A small dermatotomy was made. Under real-time sonographic guidance, the vessel was punctured with a 21 gauge micropuncture needle. Using standard technique, the initial micro needle was exchanged over a 0.018 micro wire for a transitional 4 [place] micro sheath. A J wire was then successfully navigated through the brachiocephalic vein and superior vena cava, through the right heart and into the inferior vena cava. Local anesthesia was then attained along the  previous incision. The incision was reopened using a 15 blade. The reservoir  pocket was irrigated denies small amount of hematoma removed. The retention sutures of the previously placed port catheter were cut and removed. Port catheter was removed in its entirety. A new Bard ISP power injectable port catheter was then tunneled from the reservoir pocket to the dermatotomy at the internal jugular venous entry site. The port catheter tubing was then cut to length. A peel-away sheath was advanced over the Rosen wire and into the superior vena cava. The port catheter tubing was advanced through the peel-away sheath and the peel-away sheath was removed. Fluoroscopy was used to demonstrate that the port catheter tubing is now in the upper right atrium. The port aspirates and flushes easily. An image was obtained and stored for the medical record. The incision was closed in layers using a combination of 3 of 0 interrupted inverted subdermal Vicryl sutures and a running 4-0 subcuticular Monocryl suture. The epidermis was sealed with Dermabond. The epidermis at the venous access site was sealed with Dermabond. The port catheter was left accessed. IMPRESSION: 1. Successful removal of existing left subclavian portacatheter. 2. Placement of a new left IJ approach single-lumen power injectable port catheter utilizing the same reservoir pocket. The catheter tip is in the upper right atrium and the port is ready for immediate use. 3. The port was left accessed. Signed, Criselda Peaches, MD, [facility] Radiology Electronically Signed   By: Jacqulynn Cadet M.D.   On: 05/17/2020 11:14   IR CV Line Injection  Result Date: 05/14/2020 INDICATION: 74 year old male with a history of malignant neoplasm of the upper-outer quadrant of the right breast, ER positive. He has a left subclavian port catheter which was placed by general surgery on 05/08/2020. Unfortunately, there having  difficulty with aspiration of the port catheter at the infusion center and the patient presents for further evaluation. EXAM: CENTRAL VENOUS CATHETER MEDICATIONS: None. ANESTHESIA/SEDATION: None FLUOROSCOPY TIME:  Fluoroscopy Time: 0 minutes 18 seconds (86 mGy). COMPLICATIONS: None immediate. PROCEDURE: The catheter was imaged with fluoroscopic imaging. This is a left subclavian approach single-lumen power injectable port catheter. There is some redundancy of the catheter tubing in the subcutaneous tissues before entering the subclavian vein. Additionally, the catheter tip overlies the midline, likely within the innominate vein. Catheter injection was then performed under digital subtraction angiography. The catheter tip is in the distal third of the innominate vein. A filling defect is present at the catheter tip consistent with either a small thrombus or developing fibrin sheath. Aspiration was attempted but was unsuccessful. The catheter was flushed. IMPRESSION: 1. Left subclavian approach single-lumen power injectable port catheter. The catheter tip is present within the distal third of the left innominate vein. The catheter tubing lies along the inferior wall of the vein and there is early development of a small thrombus or fibrin sheath at the catheter tip inhibiting aspiration. 2. Ultimately, revision for a longer catheter with the tip terminating in the distal SVC, cavoatrial junction or upper right atrium would be required for optimal catheter function. These results were called by telephone at the time of interpretation on 05/14/2020 at 11:46 am to provider Dr. Jana Hakim, who verbally acknowledged these results. Electronically Signed   By: Jacqulynn Cadet M.D.   On: 05/14/2020 11:47   DG CHEST PORT 1 VIEW  Result Date: 05/08/2020 CLINICAL DATA:  Central line placement EXAM: PORTABLE CHEST 1 VIEW COMPARISON:  01/24/2018 FINDINGS: Left Port-A-Cath placement with the tip in the upper SVC. No pneumothorax.  Heart is normal  size. Left base atelectasis. Right lung clear. No effusions or acute bony abnormality. IMPRESSION: Left subclavian Port-A-Cath placement with the tip in the upper SVC. No pneumothorax. Left base atelectasis. Electronically Signed   By: Rolm Baptise M.D.   On: 05/08/2020 10:17   DG Fluoro Guide CV Line-No Report  Result Date: 05/08/2020 Fluoroscopy was utilized by the requesting physician.  No radiographic interpretation.   IR IMAGING GUIDED PORT INSERTION  Result Date: 05/17/2020 INDICATION: 74 year old male with male breast cancer of the upper outer quadrant of the right breast. He has a surgically placed left subclavian port catheter which was recently placed on 05/14/2020. Unfortunately, the catheter retracted back into the left brachiocephalic vein resulting in apposition of the catheter tubing along the inferior wall of the vein and early port catheter malfunction. Patient presents today for port catheter revision. He has chemotherapy immediately following revision and so we will leave the new port accessed. EXAM: IR IMAGING GUIDED PORT INSERTION; REMOVAL PORT MEDICATIONS: 2 g Ancef; The antibiotic was administered within an appropriate time interval prior to skin puncture. ANESTHESIA/SEDATION: Moderate (conscious) sedation was employed during this procedure. A total of Versed 3 mg and Fentanyl 100 mcg was administered intravenously. Moderate Sedation Time: 23 minutes. The patient's level of consciousness and vital signs were monitored continuously by radiology nursing throughout the procedure under my direct supervision. FLUOROSCOPY TIME:  Fluoroscopy Time: 0 minutes 12 seconds (28 mGy). COMPLICATIONS: None immediate. PROCEDURE: Informed written consent was obtained from the patient after a thorough discussion of the procedural risks, benefits and alternatives. All questions were addressed. Maximal Sterile Barrier Technique was utilized including caps, mask, sterile gowns, sterile  gloves, sterile drape, hand hygiene and skin antiseptic. A timeout was performed prior to the initiation of the procedure. The left internal jugular vein was interrogated with ultrasound and found to be widely patent. An image was obtained and stored for the medical record. Local anesthesia was attained by infiltration with 1% lidocaine. A small dermatotomy was made. Under real-time sonographic guidance, the vessel was punctured with a 21 gauge micropuncture needle. Using standard technique, the initial micro needle was exchanged over a 0.018 micro wire for a transitional 4 [place] micro sheath. A J wire was then successfully navigated through the brachiocephalic vein and superior vena cava, through the right heart and into the inferior vena cava. Local anesthesia was then attained along the previous incision. The incision was reopened using a 15 blade. The reservoir pocket was irrigated denies small amount of hematoma removed. The retention sutures of the previously placed port catheter were cut and removed. Port catheter was removed in its entirety. A new Bard ISP power injectable port catheter was then tunneled from the reservoir pocket to the dermatotomy at the internal jugular venous entry site. The port catheter tubing was then cut to length. A peel-away sheath was advanced over the Rosen wire and into the superior vena cava. The port catheter tubing was advanced through the peel-away sheath and the peel-away sheath was removed. Fluoroscopy was used to demonstrate that the port catheter tubing is now in the upper right atrium. The port aspirates and flushes easily. An image was obtained and stored for the medical record. The incision was closed in layers using a combination of 3 of 0 interrupted inverted subdermal Vicryl sutures and a running 4-0 subcuticular Monocryl suture. The epidermis was sealed with Dermabond. The epidermis at the venous access site was sealed with Dermabond. The port catheter was left  accessed. IMPRESSION: 1. Successful  removal of existing left subclavian portacatheter. 2. Placement of a new left IJ approach single-lumen power injectable port catheter utilizing the same reservoir pocket. The catheter tip is in the upper right atrium and the port is ready for immediate use. 3. The port was left accessed. Signed, Criselda Peaches, MD, [facility] Radiology Electronically Signed   By: Jacqulynn Cadet M.D.   On: 05/17/2020 11:14     ELIGIBLE FOR AVAILABLE RESEARCH PROTOCOL: no  ASSESSMENT: 74 y.o. Logan Wright man status post right breast upper outer quadrant lumpectomy 03/02/2020 for a pT2 pN0, stage IIA invasive ductal carcinoma, grade 2, with negative margins; estrogen receptor strongly positive, progesterone receptor moderately positive, HER-2 negative by FISH, MIB-1 of 15%  (a) a total of 3 sentinel lymph nodes were removed  (1) Oncotype score of 40 predicts a risk of recurrence outside the breast within the next 9 years of 28% if the patient's only systemic therapy is antiestrogens for 5 years.  It also predicts a significant benefit from adjuvant chemotherapy.  (a) Adjuvant CMF chemotherapy to start 05/10/2020 and to be given every 21 days x 8 cycles  (2) adjuvant chemotherapy recommended in visit 04/20/2020  (3) genetics testing pending  (4) tamoxifen to start at the completion of chemotherapy   PLAN: Ferry tolerated his first cycle of CMF quite well.  His port has been working well since the revision, and his CBC today is stable.  We reviewed this with him in detail.    His sleep is being impacted and he can continue to take the Lorazepam PRN for his sleep issues. With his next cycle we can consider dose reducing his dexamethasone orally.    He is doing great remaining active and I continue to encourage this.    We discussed his immune system.  I recommended that he continue doing what he has been.  Wearing a  mask, avoiding large crowds, or places that are not well ventilated.  He knows that if he develops anything concerning for COVID 19 to call us, as we will help arrange testing for him as an outpatient, and also can get him in for monoclonal infusion if indicated.  He understands this.    We will see Logan Wright back in 2 weeks for cycle 2 of his CMF.  She knows to call for any questions that may arise between now and her next appointment.  We are happy to see her sooner if needed.  Total encounter time 20 minutes.Wilber Bihari, NP 05/23/20 12:06 PM Medical Oncology and Hematology [facility] [place], [place] [address] Tel. [phone]    Fax. [phone]   *Total Encounter Time as defined by the [facility] [employer] [facility] includes, in addition to the face-to-face time of a patient visit (documented in the note above) non-face-to-face time: obtaining and reviewing outside history, ordering and reviewing medications, tests or procedures, care coordination (communications with other health care professionals or caregivers) and documentation in the medical record.

## 2020-05-24 ENCOUNTER — Telehealth: Payer: Self-pay | Admitting: Oncology

## 2020-05-24 NOTE — Telephone Encounter (Signed)
No 8/12 los. No changes made to pt's schedule.

## 2020-05-28 ENCOUNTER — Encounter: Payer: Self-pay | Admitting: Oncology

## 2020-05-29 DIAGNOSIS — M1710 Unilateral primary osteoarthritis, unspecified knee: Secondary | ICD-10-CM | POA: Diagnosis not present

## 2020-05-29 DIAGNOSIS — E782 Mixed hyperlipidemia: Secondary | ICD-10-CM | POA: Diagnosis not present

## 2020-05-29 DIAGNOSIS — I10 Essential (primary) hypertension: Secondary | ICD-10-CM | POA: Diagnosis not present

## 2020-05-29 DIAGNOSIS — N183 Chronic kidney disease, stage 3 unspecified: Secondary | ICD-10-CM | POA: Diagnosis not present

## 2020-05-29 DIAGNOSIS — M1610 Unilateral primary osteoarthritis, unspecified hip: Secondary | ICD-10-CM | POA: Diagnosis not present

## 2020-05-29 DIAGNOSIS — C50921 Malignant neoplasm of unspecified site of right male breast: Secondary | ICD-10-CM | POA: Diagnosis not present

## 2020-06-06 ENCOUNTER — Encounter: Payer: Self-pay | Admitting: *Deleted

## 2020-06-06 ENCOUNTER — Other Ambulatory Visit: Payer: Self-pay

## 2020-06-06 ENCOUNTER — Inpatient Hospital Stay: Payer: PPO

## 2020-06-06 ENCOUNTER — Encounter: Payer: Self-pay | Admitting: Oncology

## 2020-06-06 ENCOUNTER — Inpatient Hospital Stay (HOSPITAL_BASED_OUTPATIENT_CLINIC_OR_DEPARTMENT_OTHER): Payer: PPO | Admitting: Adult Health

## 2020-06-06 VITALS — BP 140/69 | HR 69 | Temp 97.4°F | Resp 18 | Ht 67.0 in | Wt 222.5 lb

## 2020-06-06 DIAGNOSIS — Z170 Estrogen receptor positive status [ER+]: Secondary | ICD-10-CM

## 2020-06-06 DIAGNOSIS — C50421 Malignant neoplasm of upper-outer quadrant of right male breast: Secondary | ICD-10-CM | POA: Diagnosis not present

## 2020-06-06 DIAGNOSIS — Z5111 Encounter for antineoplastic chemotherapy: Secondary | ICD-10-CM | POA: Diagnosis not present

## 2020-06-06 DIAGNOSIS — Z95828 Presence of other vascular implants and grafts: Secondary | ICD-10-CM

## 2020-06-06 DIAGNOSIS — Z9011 Acquired absence of right breast and nipple: Secondary | ICD-10-CM

## 2020-06-06 LAB — CBC WITH DIFFERENTIAL/PLATELET
Abs Immature Granulocytes: 0.23 10*3/uL — ABNORMAL HIGH (ref 0.00–0.07)
Basophils Absolute: 0.1 10*3/uL (ref 0.0–0.1)
Basophils Relative: 1 %
Eosinophils Absolute: 0.1 10*3/uL (ref 0.0–0.5)
Eosinophils Relative: 1 %
HCT: 45.1 % (ref 39.0–52.0)
Hemoglobin: 14.9 g/dL (ref 13.0–17.0)
Immature Granulocytes: 4 %
Lymphocytes Relative: 33 %
Lymphs Abs: 2 10*3/uL (ref 0.7–4.0)
MCH: 27.7 pg (ref 26.0–34.0)
MCHC: 33 g/dL (ref 30.0–36.0)
MCV: 83.8 fL (ref 80.0–100.0)
Monocytes Absolute: 1.1 10*3/uL — ABNORMAL HIGH (ref 0.1–1.0)
Monocytes Relative: 18 %
Neutro Abs: 2.6 10*3/uL (ref 1.7–7.7)
Neutrophils Relative %: 43 %
Platelets: 240 10*3/uL (ref 150–400)
RBC: 5.38 MIL/uL (ref 4.22–5.81)
RDW: 13.4 % (ref 11.5–15.5)
WBC: 6 10*3/uL (ref 4.0–10.5)
nRBC: 0 % (ref 0.0–0.2)

## 2020-06-06 LAB — COMPREHENSIVE METABOLIC PANEL
ALT: 35 U/L (ref 0–44)
AST: 32 U/L (ref 15–41)
Albumin: 3.5 g/dL (ref 3.5–5.0)
Alkaline Phosphatase: 76 U/L (ref 38–126)
Anion gap: 9 (ref 5–15)
BUN: 23 mg/dL (ref 8–23)
CO2: 28 mmol/L (ref 22–32)
Calcium: 9.8 mg/dL (ref 8.9–10.3)
Chloride: 104 mmol/L (ref 98–111)
Creatinine, Ser: 1.35 mg/dL — ABNORMAL HIGH (ref 0.61–1.24)
GFR calc Af Amer: 59 mL/min — ABNORMAL LOW (ref >60)
GFR calc non Af Amer: 51 mL/min — ABNORMAL LOW (ref >60)
Glucose, Bld: 94 mg/dL (ref 70–99)
Potassium: 4 mmol/L (ref 3.5–5.1)
Sodium: 141 mmol/L (ref 135–145)
Total Bilirubin: 0.6 mg/dL (ref 0.3–1.2)
Total Protein: 6.8 g/dL (ref 6.5–8.1)

## 2020-06-06 MED ORDER — PALONOSETRON HCL INJECTION 0.25 MG/5ML
0.2500 mg | Freq: Once | INTRAVENOUS | Status: AC
  Administered 2020-06-06: 0.25 mg via INTRAVENOUS

## 2020-06-06 MED ORDER — HEPARIN SOD (PORK) LOCK FLUSH 100 UNIT/ML IV SOLN
500.0000 [IU] | Freq: Once | INTRAVENOUS | Status: AC | PRN
  Administered 2020-06-06: 500 [IU]
  Filled 2020-06-06: qty 5

## 2020-06-06 MED ORDER — SODIUM CHLORIDE 0.9 % IV SOLN
10.0000 mg | Freq: Once | INTRAVENOUS | Status: AC
  Administered 2020-06-06: 10 mg via INTRAVENOUS
  Filled 2020-06-06: qty 10

## 2020-06-06 MED ORDER — SODIUM CHLORIDE 0.9 % IV SOLN
600.0000 mg/m2 | Freq: Once | INTRAVENOUS | Status: AC
  Administered 2020-06-06: 1320 mg via INTRAVENOUS
  Filled 2020-06-06: qty 66

## 2020-06-06 MED ORDER — PROCHLORPERAZINE EDISYLATE 10 MG/2ML IJ SOLN
INTRAMUSCULAR | Status: AC
  Filled 2020-06-06: qty 2

## 2020-06-06 MED ORDER — SODIUM CHLORIDE 0.9 % IV SOLN
Freq: Once | INTRAVENOUS | Status: AC
  Filled 2020-06-06: qty 250

## 2020-06-06 MED ORDER — SODIUM CHLORIDE 0.9% FLUSH
10.0000 mL | INTRAVENOUS | Status: DC | PRN
  Administered 2020-06-06: 10 mL
  Filled 2020-06-06: qty 10

## 2020-06-06 MED ORDER — PROCHLORPERAZINE EDISYLATE 10 MG/2ML IJ SOLN
10.0000 mg | Freq: Once | INTRAMUSCULAR | Status: AC
  Administered 2020-06-06: 10 mg via INTRAVENOUS

## 2020-06-06 MED ORDER — PALONOSETRON HCL INJECTION 0.25 MG/5ML
INTRAVENOUS | Status: AC
  Filled 2020-06-06: qty 5

## 2020-06-06 MED ORDER — FLUOROURACIL CHEMO INJECTION 2.5 GM/50ML
600.0000 mg/m2 | Freq: Once | INTRAVENOUS | Status: AC
  Administered 2020-06-06: 1300 mg via INTRAVENOUS
  Filled 2020-06-06: qty 26

## 2020-06-06 MED ORDER — SODIUM CHLORIDE 0.9% FLUSH
10.0000 mL | INTRAVENOUS | Status: DC | PRN
  Administered 2020-06-06: 10 mL via INTRAVENOUS
  Filled 2020-06-06: qty 10

## 2020-06-06 MED ORDER — METHOTREXATE SODIUM (PF) CHEMO INJECTION 250 MG/10ML
40.0000 mg/m2 | Freq: Once | INTRAMUSCULAR | Status: AC
  Administered 2020-06-06: 87.5 mg via INTRAVENOUS
  Filled 2020-06-06: qty 3.5

## 2020-06-06 NOTE — Progress Notes (Signed)
[facility]  [phone] [phone]     ID: Logan Wright DOB: 08-17-1946  MR#: [id number]  UVO#:[id number]  Patient Care Team: [name], MD as PCP - General (Family Medicine) Magrinat, [name], MD as Consulting Physician (Oncology) [name], MD as Consulting Physician (General Surgery) Kyung Rudd, MD as Consulting Physician (Radiation Oncology) Lavonna Monarch, MD as Consulting Physician (Dermatology) Paralee Cancel, MD as Consulting Physician (Orthopedic Surgery) Mauro Kaufmann, RN as Oncology Nurse Navigator Rockwell Germany, RN as Oncology Nurse Monroe, NP OTHER MD:  CHIEF COMPLAINT: estrogen receptor positive breast cancer  CURRENT TREATMENT: Adjuvant chemotherapy   INTERVAL HISTORY: Logan Wright returns today for follow up of his estrogen receptor positive breast cancer unaccompanied.  He is here to start adjuvant chemotherapy with CMF.  This treatment is given on day 1 of a 21 day cycle x 8.  He is here to receive cycle 2 today.  He notes that he is feeling quite well.    REVIEW OF SYSTEMS: Logan Wright has not had any nausea.  His main issue has been sleep.  He wants to know if he can cut back on his Dexamethasone.  He has had no fever, chills, chest pain, palpitations, cough, shortness of breath, bowel/bladder changes, headaches, vision issues, or any other concerns.  A detailed ROS was otherwise non contributory.    HISTORY OF CURRENT ILLNESS: From the original intake note:  Logan Wright palpated a right breast mass sometime late 2020.  He had a prior left breast mass in the 1990s which was found to be a fat deposit and he assumed this would be the same.  He did mention it to Dr. Jacelyn Grip at the time of their regular physical and he set Logan Wright up for bilateral diagnostic mammography with tomography and right breast ultrasonography at [facility] on 02/21/2020 showing: breast density category B; 3.2 cm irregular  mass at 11 o'clock in the right breast; normal right axillary lymph nodes.  Accordingly on 03/02/2020 the patient proceeded to biopsy of the right breast area in question. The pathology from this procedure ([id number]) showed: invasive ductal carcinoma, grade 2-3. Prognostic indicators significant for: estrogen receptor, 90% positive with strong staining intensity and progesterone receptor, 5% positive with moderate staining intensity. Proliferation marker Ki67 at 15%. HER2 equivocal by immunohistochemistry (2+), but negative by fluorescent in situ hybridization with a signals ratio 1.35 and number per cell 3.1.  He met with Dr. Lisbeth Renshaw on 03/16/2020 to discuss adjuvant radiation therapy. Per consultation note,  postmastectomy radiation was felt to be unlikely.  He proceeded to right mastectomy on 03/16/2020 under Dr. [name]. Pathology from the procedure [id number]) showed: invasive ductal carcinoma, grade 2, 3.2 cm; margins not involved.  All three sentinel lymph nodes removed were negative for carcinoma (0/3).  The patient's subsequent history is as detailed below.   PAST MEDICAL HISTORY: Past Medical History:  Diagnosis Date  . Breast cancer, right ([place])   . CKD (chronic kidney disease), stage III   . Colon adenoma 2015  . Diverticulosis    DENIES   . DJD (degenerative joint disease)   . ED (erectile dysfunction)   . Elevated blood pressure 2007-2008  . Esophageal reflux   . GERD (gastroesophageal reflux disease)   . Gout   . Gynecomastia    LEFT  . Hearing loss   . HTN (hypertension)   . Hypercholesteremia   . Non-cardiac chest pain   . OA (osteoarthritis)  LEFT SHOULDER  . Onychomycosis of foot with other complication   . Pre-diabetes   . Seborrhea   . Severe frontal headaches    RESOLVED   . Vertigo    RESOLVED   . Wears glasses   . Wears hearing aid     PAST SURGICAL HISTORY: Past Surgical History:  Procedure Laterality Date  . COLONSCOPY     . HIP  ARTHROPLASTY Bilateral    AT [facility]   . IR CV LINE INJECTION  05/14/2020  . IR IMAGING GUIDED PORT INSERTION  05/17/2020  . IR REMOVAL TUN ACCESS W/ PORT W/O FL MOD SED  05/17/2020  . KNEE ARTHROSCOPY     UNSURE WHICH KNEE   . MASTECTOMY W/ SENTINEL NODE BIOPSY Right 03/16/2020   Procedure: RIGHT BREAST MASTECTOMY WITH SENTINEL LYMPH NODE BIOPSY;  Surgeon: [name], MD;  Location: [facility];  Service: General;  Laterality: Right;  . PORTACATH PLACEMENT Left 05/08/2020   Procedure: INSERTION PORT-A-CATH WITH ULTRASOUND;  Surgeon: [name], MD;  Location: [facility];  Service: General;  Laterality: Left;  LMA  . TOTAL KNEE ARTHROPLASTY Left 11/23/2018   Procedure: TOTAL KNEE ARTHROPLASTY;  Surgeon: Paralee Cancel, MD;  Location: [facility];  Service: Orthopedics;  Laterality: Left;  70 mins    FAMILY HISTORY: Family History  Problem Relation Age of Onset  . Heart attack Mother   . Stroke Mother   . Heart failure Father        56  . Hypertension Neg Hx   The patient is of Ashkenazi background.  His father died at age 70 and his mother at age 33.  On the father's side, the paternal grandfather died young from unclear causes.  The paternal grandmother did not have cancer.  The patient's father was a single child.  On the maternal side the patient's maternal grandmother died young again from unclear reasons but the paternal grandfather and the 3 maternal aunts were all died in old age with no cancer.  The patient also knows of no cancer in his cousins.  He himself has 1 brother with no history of cancer, no sisters.   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  Previously he used to on [employer] which she sold in 1999.  He is a IT sales professional.  His wife of 34+ years Ivin Booty is very active in the community.  Their daughter Lowella Curb his Engineer, civil (consulting) for a [facility] psychiatric group.  She lives in [place].  Son Annie Main lives in [place] by himself.  Patient has 3 grandchildren.  He attends [employer]    ADVANCED DIRECTIVES: In the absence of any documentation to the contrary, the patient's spouse is his HCPOA.    HEALTH MAINTENANCE: Social History   Tobacco Use  . Smoking status: Never Smoker  . Smokeless tobacco: Never Used  Vaping Use  . Vaping Use: Never used  Substance Use Topics  . Alcohol use: No    Alcohol/week: 0.0 standard drinks  . Drug use: No     Colonoscopy: [facility]  Bone density: -  PSA:   No Known Allergies  Current Outpatient Medications  Medication Sig Dispense Refill  . allopurinol (ZYLOPRIM) 100 MG tablet Take 200 mg by mouth daily.    Marland Kitchen atorvastatin (LIPITOR) 40 MG tablet Take 40 mg by mouth daily.    Marland Kitchen dexamethasone (  DECADRON) 4 MG tablet Take 2 tablets (8 mg total) by mouth daily. Start the day after chemotherapy for 2 days. Take with food. 30 tablet 1  . GLUCOSAMINE-CHONDROITIN PO Take 2 tablets by mouth daily.    Marland Kitchen HYDROcodone-acetaminophen (NORCO/VICODIN) 5-325 MG tablet Take 1 tablet by mouth every 4 (four) hours as needed for moderate pain. 25 tablet 0  . lidocaine-prilocaine (EMLA) cream Apply to affected area once (Patient taking differently: Apply 1 application topically daily as needed (port access). ) 30 g 3  . LORazepam (ATIVAN) 0.5 MG tablet Take 1 tablet (0.5 mg total) by mouth at bedtime as needed (Nausea or vomiting). 20 tablet 0  . losartan-hydrochlorothiazide (HYZAAR) 50-12.5 MG tablet TAKE 1 TABLET BY MOUTH DAILY. (Patient taking differently: Take 1 tablet by mouth daily. ) 30 tablet 5  . Multiple Vitamin (MULTIVITAMIN) tablet Take 1 tablet by mouth daily.     . Omega-3 Fatty Acids (FISH OIL ULTRA) 1400 MG CAPS Take 1,400-2,800 mg by mouth See admin instructions. Take 1400 mg in the morning and 2800 mg in the evening    . Potassium 99 MG TABS Take 99 mg by mouth  daily.      No current facility-administered medications for this visit.   Facility-Administered Medications Ordered in Other Visits  Medication Dose Route Frequency Provider Last Rate Last Admin  . dexamethasone (DECADRON) 10 mg in sodium chloride 0.9 % 50 mL IVPB  10 mg Intravenous Once Claxton Levitz, Charlestine Massed, NP      . heparin lock flush 100 unit/mL  500 Units Intracatheter Once PRN Gardenia Phlegm, NP      . sodium chloride flush (NS) 0.9 % injection 10 mL  10 mL Intracatheter PRN Gardenia Phlegm, NP        OBJECTIVE:   Vitals:   06/06/20 1354  BP: 140/69  Pulse: 69  Resp: 18  Temp: (!) 97.4 F (36.3 C)  SpO2: 97%     Body mass index is 34.85 kg/m.   Wt Readings from Last 3 Encounters:  06/06/20 222 lb 8 oz (100.9 kg)  05/23/20 224 lb 3.2 oz (101.7 kg)  05/10/20 (!) 227 lb 6.4 oz (103.1 kg)      ECOG FS:1 - Symptomatic but completely ambulatory  GENERAL: Patient is a well appearing male in no acute distress HEENT:  Sclerae anicteric. Mask in place.  Neck is supple.  NODES:  No cervical, supraclavicular, or axillary lymphadenopathy palpated.  BREAST EXAM:  deferred LUNGS:  Clear to auscultation bilaterally.  No wheezes or rhonchi. HEART:  Regular rate and rhythm. No murmur appreciated. ABDOMEN:  Soft, nontender.  Positive, normoactive bowel sounds. No organomegaly palpated. MSK:  No focal spinal tenderness to palpation. Full range of motion bilaterally in the upper extremities. EXTREMITIES:  No peripheral edema.   SKIN:  Clear with no obvious rashes or skin changes. No nail dyscrasia. NEURO:  Nonfocal. Well oriented.  Appropriate affect.    LAB RESULTS:  CMP     Component Value Date/Time   NA 141 06/06/2020 1330   K 4.0 06/06/2020 1330   CL 104 06/06/2020 1330   CO2 28 06/06/2020 1330   GLUCOSE 94 06/06/2020 1330   BUN 23 06/06/2020 1330   CREATININE 1.35 (H) 06/06/2020 1330   CREATININE 1.47 (H) 08/24/2015 1257   CALCIUM 9.8 06/06/2020  1330   PROT 6.8 06/06/2020 1330   ALBUMIN 3.5 06/06/2020 1330   AST 32 06/06/2020 1330   ALT 35 06/06/2020 1330  ALKPHOS 76 06/06/2020 1330   BILITOT 0.6 06/06/2020 1330   GFRNONAA 51 (L) 06/06/2020 1330   GFRAA 59 (L) 06/06/2020 1330    No results found for: TOTALPROTELP, ALBUMINELP, A1GS, A2GS, BETS, BETA2SER, GAMS, MSPIKE, SPEI  Lab Results  Component Value Date   WBC 6.0 06/06/2020   NEUTROABS 2.6 06/06/2020   HGB 14.9 06/06/2020   HCT 45.1 06/06/2020   MCV 83.8 06/06/2020   PLT 240 06/06/2020    No results found for: LABCA2  No components found for: KKXFGH829  No results for input(s): INR in the last 168 hours.  No results found for: LABCA2  No results found for: HBZ169  No results found for: CVE938  No results found for: BOF751  No results found for: CA2729  No components found for: HGQUANT  No results found for: CEA1 / No results found for: CEA1   No results found for: AFPTUMOR  No results found for: CHROMOGRNA  No results found for: KPAFRELGTCHN, LAMBDASER, KAPLAMBRATIO (kappa/lambda light chains)  No results found for: HGBA, HGBA2QUANT, HGBFQUANT, HGBSQUAN (Hemoglobinopathy evaluation)   No results found for: LDH  No results found for: IRON, TIBC, IRONPCTSAT (Iron and TIBC)  No results found for: FERRITIN  Urinalysis No results found for: COLORURINE, APPEARANCEUR, LABSPEC, PHURINE, GLUCOSEU, HGBUR, BILIRUBINUR, KETONESUR, PROTEINUR, UROBILINOGEN, NITRITE, LEUKOCYTESUR   STUDIES: IR REMOVAL TUN ACCESS W/ PORT W/O FL MOD SED  Result Date: 05/17/2020 INDICATION: 74 year old male with male breast cancer of the upper outer quadrant of the right breast. He has a surgically placed left subclavian port catheter which was recently placed on 05/14/2020. Unfortunately, the catheter retracted back into the left brachiocephalic vein resulting in apposition of the catheter tubing along the inferior wall of the vein and early port catheter malfunction.  Patient presents today for port catheter revision. He has chemotherapy immediately following revision and so we will leave the new port accessed. EXAM: IR IMAGING GUIDED PORT INSERTION; REMOVAL PORT MEDICATIONS: 2 g Ancef; The antibiotic was administered within an appropriate time interval prior to skin puncture. ANESTHESIA/SEDATION: Moderate (conscious) sedation was employed during this procedure. A total of Versed 3 mg and Fentanyl 100 mcg was administered intravenously. Moderate Sedation Time: 23 minutes. The patient's level of consciousness and vital signs were monitored continuously by radiology nursing throughout the procedure under my direct supervision. FLUOROSCOPY TIME:  Fluoroscopy Time: 0 minutes 12 seconds (28 mGy). COMPLICATIONS: None immediate. PROCEDURE: Informed written consent was obtained from the patient after a thorough discussion of the procedural risks, benefits and alternatives. All questions were addressed. Maximal Sterile Barrier Technique was utilized including caps, mask, sterile gowns, sterile gloves, sterile drape, hand hygiene and skin antiseptic. A timeout was performed prior to the initiation of the procedure. The left internal jugular vein was interrogated with ultrasound and found to be widely patent. An image was obtained and stored for the medical record. Local anesthesia was attained by infiltration with 1% lidocaine. A small dermatotomy was made. Under real-time sonographic guidance, the vessel was punctured with a 21 gauge micropuncture needle. Using standard technique, the initial micro needle was exchanged over a 0.018 micro wire for a transitional 4 [place] micro sheath. A J wire was then successfully navigated through the brachiocephalic vein and superior vena cava, through the right heart and into the inferior vena cava. Local anesthesia was then attained along the previous incision. The incision was reopened using a 15 blade. The reservoir pocket was irrigated denies small  amount of hematoma removed. The retention sutures  of the previously placed port catheter were cut and removed. Port catheter was removed in its entirety. A new Bard ISP power injectable port catheter was then tunneled from the reservoir pocket to the dermatotomy at the internal jugular venous entry site. The port catheter tubing was then cut to length. A peel-away sheath was advanced over the Rosen wire and into the superior vena cava. The port catheter tubing was advanced through the peel-away sheath and the peel-away sheath was removed. Fluoroscopy was used to demonstrate that the port catheter tubing is now in the upper right atrium. The port aspirates and flushes easily. An image was obtained and stored for the medical record. The incision was closed in layers using a combination of 3 of 0 interrupted inverted subdermal Vicryl sutures and a running 4-0 subcuticular Monocryl suture. The epidermis was sealed with Dermabond. The epidermis at the venous access site was sealed with Dermabond. The port catheter was left accessed. IMPRESSION: 1. Successful removal of existing left subclavian portacatheter. 2. Placement of a new left IJ approach single-lumen power injectable port catheter utilizing the same reservoir pocket. The catheter tip is in the upper right atrium and the port is ready for immediate use. 3. The port was left accessed. Signed, Criselda Peaches, MD, [facility]iology Electronically Signed   By: Jacqulynn Cadet M.D.   On: 05/17/2020 11:14   IR CV Line Injection  Result Date: 05/14/2020 INDICATION: 74 year old male with a history of malignant neoplasm of the upper-outer quadrant of the right breast, ER positive. He has a left subclavian port catheter which was placed by general surgery on 05/08/2020. Unfortunately, there having difficulty with aspiration of the port catheter at the infusion center and the patient presents for further  evaluation. EXAM: CENTRAL VENOUS CATHETER MEDICATIONS: None. ANESTHESIA/SEDATION: None FLUOROSCOPY TIME:  Fluoroscopy Time: 0 minutes 18 seconds (86 mGy). COMPLICATIONS: None immediate. PROCEDURE: The catheter was imaged with fluoroscopic imaging. This is a left subclavian approach single-lumen power injectable port catheter. There is some redundancy of the catheter tubing in the subcutaneous tissues before entering the subclavian vein. Additionally, the catheter tip overlies the midline, likely within the innominate vein. Catheter injection was then performed under digital subtraction angiography. The catheter tip is in the distal third of the innominate vein. A filling defect is present at the catheter tip consistent with either a small thrombus or developing fibrin sheath. Aspiration was attempted but was unsuccessful. The catheter was flushed. IMPRESSION: 1. Left subclavian approach single-lumen power injectable port catheter. The catheter tip is present within the distal third of the left innominate vein. The catheter tubing lies along the inferior wall of the vein and there is early development of a small thrombus or fibrin sheath at the catheter tip inhibiting aspiration. 2. Ultimately, revision for a longer catheter with the tip terminating in the distal SVC, cavoatrial junction or upper right atrium would be required for optimal catheter function. These results were called by telephone at the time of interpretation on 05/14/2020 at 11:46 am to provider Dr. Jana Hakim, who verbally acknowledged these results. Electronically Signed   By: Jacqulynn Cadet M.D.   On: 05/14/2020 11:47   DG CHEST PORT 1 VIEW  Result Date: 05/08/2020 CLINICAL DATA:  Central line placement EXAM: PORTABLE CHEST 1 VIEW COMPARISON:  01/24/2018 FINDINGS: Left Port-A-Cath placement with the tip in the upper SVC. No pneumothorax. Heart is normal size. Left base atelectasis. Right lung clear. No effusions or acute bony abnormality.  IMPRESSION: Left subclavian Port-A-Cath placement with the tip in the upper SVC. No pneumothorax. Left base atelectasis. Electronically Signed   By: Rolm Baptise M.D.   On: 05/08/2020 10:17   DG Fluoro Guide CV Line-No Report  Result Date: 05/08/2020 Fluoroscopy was utilized by the requesting physician.  No radiographic interpretation.   IR IMAGING GUIDED PORT INSERTION  Result Date: 05/17/2020 INDICATION: 74 year old male with male breast cancer of the upper outer quadrant of the right breast. He has a surgically placed left subclavian port catheter which was recently placed on 05/14/2020. Unfortunately, the catheter retracted back into the left brachiocephalic vein resulting in apposition of the catheter tubing along the inferior wall of the vein and early port catheter malfunction. Patient presents today for port catheter revision. He has chemotherapy immediately following revision and so we will leave the new port accessed. EXAM: IR IMAGING GUIDED PORT INSERTION; REMOVAL PORT MEDICATIONS: 2 g Ancef; The antibiotic was administered within an appropriate time interval prior to skin puncture. ANESTHESIA/SEDATION: Moderate (conscious) sedation was employed during this procedure. A total of Versed 3 mg and Fentanyl 100 mcg was administered intravenously. Moderate Sedation Time: 23 minutes. The patient's level of consciousness and vital signs were monitored continuously by radiology nursing throughout the procedure under my direct supervision. FLUOROSCOPY TIME:  Fluoroscopy Time: 0 minutes 12 seconds (28 mGy). COMPLICATIONS: None immediate. PROCEDURE: Informed written consent was obtained from the patient after a thorough discussion of the procedural risks, benefits and alternatives. All questions were addressed. Maximal Sterile Barrier Technique was utilized including caps, mask, sterile gowns, sterile gloves, sterile drape, hand hygiene and skin antiseptic. A timeout was performed prior to the initiation of  the procedure. The left internal jugular vein was interrogated with ultrasound and found to be widely patent. An image was obtained and stored for the medical record. Local anesthesia was attained by infiltration with 1% lidocaine. A small dermatotomy was made. Under real-time sonographic guidance, the vessel was punctured with a 21 gauge micropuncture needle. Using standard technique, the initial micro needle was exchanged over a 0.018 micro wire for a transitional 4 [place] micro sheath. A J wire was then successfully navigated through the brachiocephalic vein and superior vena cava, through the right heart and into the inferior vena cava. Local anesthesia was then attained along the previous incision. The incision was reopened using a 15 blade. The reservoir pocket was irrigated denies small amount of hematoma removed. The retention sutures of the previously placed port catheter were cut and removed. Port catheter was removed in its entirety. A new Bard ISP power injectable port catheter was then tunneled from the reservoir pocket to the dermatotomy at the internal jugular venous entry site. The port catheter tubing was then cut to length. A peel-away sheath was advanced over the Rosen wire and into the superior vena cava. The port catheter tubing was advanced through the peel-away sheath and the peel-away sheath was removed. Fluoroscopy was used to demonstrate that the port catheter tubing is now in the upper right atrium. The port aspirates and flushes easily. An image was obtained and stored for the medical record. The incision was closed in layers using a combination of 3 of 0 interrupted inverted subdermal Vicryl sutures and a running 4-0 subcuticular Monocryl suture. The epidermis was sealed with Dermabond. The epidermis at the venous access site was sealed with Dermabond. The port catheter was left accessed. IMPRESSION: 1. Successful removal of existing left subclavian portacatheter. 2. Placement of a new  left IJ  approach single-lumen power injectable port catheter utilizing the same reservoir pocket. The catheter tip is in the upper right atrium and the port is ready for immediate use. 3. The port was left accessed. Signed, Criselda Peaches, MD, [facility] Radiology Electronically Signed   By: Jacqulynn Cadet M.D.   On: 05/17/2020 11:14     ELIGIBLE FOR AVAILABLE RESEARCH PROTOCOL: no  ASSESSMENT: 74 y.o. Logan Wright status post right breast upper outer quadrant lumpectomy 03/02/2020 for a pT2 pN0, stage IIA invasive ductal carcinoma, grade 2, with negative margins; estrogen receptor strongly positive, progesterone receptor moderately positive, HER-2 negative by FISH, MIB-1 of 15%  (a) a total of 3 sentinel lymph nodes were removed  (1) Oncotype score of 40 predicts a risk of recurrence outside the breast within the next 9 years of 28% if the patient's only systemic therapy is antiestrogens for 5 years.  It also predicts a significant benefit from adjuvant chemotherapy.  (a) Adjuvant CMF chemotherapy to start 05/10/2020 and to be given every 21 days x 8 cycles  (2) adjuvant chemotherapy recommended in visit 04/20/2020  (3) genetics testing pending  (4) tamoxifen to start at the completion of chemotherapy   PLAN: Logan Wright returns today for follow up and evaluation prior to receiving his second cycle of CMF.  He continues to tolerate this quite well and will continue on this therapy every 3 weeks.  His lab work is stable and I reviewed it with him in detail.    He was recommended to take the dexamethasone one tab daily with breakfast.  This will help prevent nausea, but will also avoid him having sleep issues.    I recommended he continue with healthy diet and exercise.    We will see Ke[place] back in 3 weeks for cycle 3 of his CMF.  he knows to call for any questions that may arise between now and his next appointment.  We are happy to  see him sooner if needed.  Total encounter time 20 minutes.Wilber Bihari, NP 06/06/20 2:43 PM Medical Oncology and Hematology [facility] [place], [place] [address] Tel. [phone]    Fax. [phone]   *Total Encounter Time as defined by the Centers for Medicare and Medicaid Services includes, in addition to the face-to-face time of a patient visit (documented in the note above) non-face-to-face time: obtaining and reviewing outside history, ordering and reviewing medications, tests or procedures, care coordination (communications with other health care professionals or caregivers) and documentation in the medical record.

## 2020-06-06 NOTE — Patient Instructions (Signed)

## 2020-06-06 NOTE — Patient Instructions (Signed)
[facility] Discharge Instructions for Patients Receiving Chemotherapy  Today you received the following chemotherapy agents Cytoxan, Methotrexate, and 5FU  To help prevent nausea and vomiting after your treatment, we encourage you to take your nausea medication as directed   If you develop nausea and vomiting that is not controlled by your nausea medication, call the clinic.   BELOW ARE SYMPTOMS THAT SHOULD BE REPORTED IMMEDIATELY:  *FEVER GREATER THAN 100.5 F  *CHILLS WITH OR WITHOUT FEVER  NAUSEA AND VOMITING THAT IS NOT CONTROLLED WITH YOUR NAUSEA MEDICATION  *UNUSUAL SHORTNESS OF BREATH  *UNUSUAL BRUISING OR BLEEDING  TENDERNESS IN MOUTH AND THROAT WITH OR WITHOUT PRESENCE OF ULCERS  *URINARY PROBLEMS  *BOWEL PROBLEMS  UNUSUAL RASH Items with * indicate a potential emergency and should be followed up as soon as possible.  Feel free to call the clinic should you have any questions or concerns. The clinic phone number is [phone].  Please show the [place] at check-in to the Emergency Department and triage nurse.

## 2020-06-07 ENCOUNTER — Telehealth: Payer: Self-pay | Admitting: Adult Health

## 2020-06-07 NOTE — Telephone Encounter (Signed)
No 8/25 los. No changes made to pt's schedule.

## 2020-06-15 DIAGNOSIS — H6123 Impacted cerumen, bilateral: Secondary | ICD-10-CM | POA: Diagnosis not present

## 2020-06-15 DIAGNOSIS — Z974 Presence of external hearing-aid: Secondary | ICD-10-CM | POA: Diagnosis not present

## 2020-06-15 DIAGNOSIS — H903 Sensorineural hearing loss, bilateral: Secondary | ICD-10-CM | POA: Diagnosis not present

## 2020-06-20 ENCOUNTER — Telehealth: Payer: Self-pay | Admitting: Adult Health

## 2020-06-28 ENCOUNTER — Inpatient Hospital Stay (HOSPITAL_BASED_OUTPATIENT_CLINIC_OR_DEPARTMENT_OTHER): Payer: PPO | Admitting: Medical

## 2020-06-28 ENCOUNTER — Ambulatory Visit: Payer: PPO | Admitting: Adult Health

## 2020-06-28 ENCOUNTER — Inpatient Hospital Stay: Payer: PPO

## 2020-06-28 ENCOUNTER — Other Ambulatory Visit: Payer: PPO

## 2020-06-28 ENCOUNTER — Other Ambulatory Visit: Payer: Self-pay | Admitting: Oncology

## 2020-06-28 ENCOUNTER — Inpatient Hospital Stay: Payer: PPO | Attending: Oncology

## 2020-06-28 ENCOUNTER — Other Ambulatory Visit: Payer: Self-pay

## 2020-06-28 VITALS — BP 146/69 | HR 62 | Temp 98.1°F | Resp 18 | Wt 224.8 lb

## 2020-06-28 DIAGNOSIS — C50421 Malignant neoplasm of upper-outer quadrant of right male breast: Secondary | ICD-10-CM | POA: Diagnosis not present

## 2020-06-28 DIAGNOSIS — Z9011 Acquired absence of right breast and nipple: Secondary | ICD-10-CM

## 2020-06-28 DIAGNOSIS — N183 Chronic kidney disease, stage 3 unspecified: Secondary | ICD-10-CM | POA: Diagnosis not present

## 2020-06-28 DIAGNOSIS — Z170 Estrogen receptor positive status [ER+]: Secondary | ICD-10-CM | POA: Insufficient documentation

## 2020-06-28 DIAGNOSIS — Z5111 Encounter for antineoplastic chemotherapy: Secondary | ICD-10-CM | POA: Diagnosis not present

## 2020-06-28 DIAGNOSIS — Z95828 Presence of other vascular implants and grafts: Secondary | ICD-10-CM | POA: Insufficient documentation

## 2020-06-28 LAB — CBC WITH DIFFERENTIAL/PLATELET
Abs Immature Granulocytes: 0.3 10*3/uL — ABNORMAL HIGH (ref 0.00–0.07)
Basophils Absolute: 0.1 10*3/uL (ref 0.0–0.1)
Basophils Relative: 1 %
Eosinophils Absolute: 0.1 10*3/uL (ref 0.0–0.5)
Eosinophils Relative: 2 %
HCT: 43.8 % (ref 39.0–52.0)
Hemoglobin: 14.3 g/dL (ref 13.0–17.0)
Immature Granulocytes: 5 %
Lymphocytes Relative: 24 %
Lymphs Abs: 1.5 10*3/uL (ref 0.7–4.0)
MCH: 27.8 pg (ref 26.0–34.0)
MCHC: 32.6 g/dL (ref 30.0–36.0)
MCV: 85.2 fL (ref 80.0–100.0)
Monocytes Absolute: 1.1 10*3/uL — ABNORMAL HIGH (ref 0.1–1.0)
Monocytes Relative: 18 %
Neutro Abs: 3.3 10*3/uL (ref 1.7–7.7)
Neutrophils Relative %: 50 %
Platelets: 257 10*3/uL (ref 150–400)
RBC: 5.14 MIL/uL (ref 4.22–5.81)
RDW: 14.5 % (ref 11.5–15.5)
WBC: 6.4 10*3/uL (ref 4.0–10.5)
nRBC: 0 % (ref 0.0–0.2)

## 2020-06-28 LAB — COMPREHENSIVE METABOLIC PANEL
ALT: 37 U/L (ref 0–44)
AST: 33 U/L (ref 15–41)
Albumin: 3.5 g/dL (ref 3.5–5.0)
Alkaline Phosphatase: 68 U/L (ref 38–126)
Anion gap: 8 (ref 5–15)
BUN: 20 mg/dL (ref 8–23)
CO2: 26 mmol/L (ref 22–32)
Calcium: 9.3 mg/dL (ref 8.9–10.3)
Chloride: 108 mmol/L (ref 98–111)
Creatinine, Ser: 1.25 mg/dL — ABNORMAL HIGH (ref 0.61–1.24)
GFR calc Af Amer: >60 mL/min (ref >60)
GFR calc non Af Amer: 56 mL/min — ABNORMAL LOW (ref >60)
Glucose, Bld: 123 mg/dL — ABNORMAL HIGH (ref 70–99)
Potassium: 3.7 mmol/L (ref 3.5–5.1)
Sodium: 142 mmol/L (ref 135–145)
Total Bilirubin: 0.6 mg/dL (ref 0.3–1.2)
Total Protein: 6.6 g/dL (ref 6.5–8.1)

## 2020-06-28 MED ORDER — SODIUM CHLORIDE 0.9% FLUSH
10.0000 mL | Freq: Once | INTRAVENOUS | Status: AC
  Administered 2020-06-28: 10 mL
  Filled 2020-06-28: qty 10

## 2020-06-28 MED ORDER — FLUOROURACIL CHEMO INJECTION 2.5 GM/50ML
600.0000 mg/m2 | Freq: Once | INTRAVENOUS | Status: AC
  Administered 2020-06-28: 1300 mg via INTRAVENOUS
  Filled 2020-06-28: qty 26

## 2020-06-28 MED ORDER — PALONOSETRON HCL INJECTION 0.25 MG/5ML
INTRAVENOUS | Status: AC
  Filled 2020-06-28: qty 5

## 2020-06-28 MED ORDER — PROCHLORPERAZINE EDISYLATE 10 MG/2ML IJ SOLN
INTRAMUSCULAR | Status: AC
  Filled 2020-06-28: qty 2

## 2020-06-28 MED ORDER — SODIUM CHLORIDE 0.9 % IV SOLN
Freq: Once | INTRAVENOUS | Status: AC
  Filled 2020-06-28: qty 250

## 2020-06-28 MED ORDER — HEPARIN SOD (PORK) LOCK FLUSH 100 UNIT/ML IV SOLN
500.0000 [IU] | Freq: Once | INTRAVENOUS | Status: AC | PRN
  Administered 2020-06-28: 500 [IU]
  Filled 2020-06-28: qty 5

## 2020-06-28 MED ORDER — PROCHLORPERAZINE EDISYLATE 10 MG/2ML IJ SOLN
10.0000 mg | Freq: Once | INTRAMUSCULAR | Status: AC
  Administered 2020-06-28: 10 mg via INTRAVENOUS

## 2020-06-28 MED ORDER — METHOTREXATE SODIUM (PF) CHEMO INJECTION 250 MG/10ML
40.0000 mg/m2 | Freq: Once | INTRAMUSCULAR | Status: AC
  Administered 2020-06-28: 87.5 mg via INTRAVENOUS
  Filled 2020-06-28: qty 3.5

## 2020-06-28 MED ORDER — SODIUM CHLORIDE 0.9% FLUSH
10.0000 mL | INTRAVENOUS | Status: DC | PRN
  Administered 2020-06-28: 10 mL
  Filled 2020-06-28: qty 10

## 2020-06-28 MED ORDER — SODIUM CHLORIDE 0.9 % IV SOLN
10.0000 mg | Freq: Once | INTRAVENOUS | Status: AC
  Administered 2020-06-28: 10 mg via INTRAVENOUS
  Filled 2020-06-28: qty 10

## 2020-06-28 MED ORDER — PALONOSETRON HCL INJECTION 0.25 MG/5ML
0.2500 mg | Freq: Once | INTRAVENOUS | Status: AC
  Administered 2020-06-28: 0.25 mg via INTRAVENOUS

## 2020-06-28 MED ORDER — SODIUM CHLORIDE 0.9 % IV SOLN
600.0000 mg/m2 | Freq: Once | INTRAVENOUS | Status: AC
  Administered 2020-06-28: 1320 mg via INTRAVENOUS
  Filled 2020-06-28: qty 66

## 2020-06-28 NOTE — Progress Notes (Signed)
Symptoms Management Clinic Progress Note   [name] [id number] 09-Jul-1946 74 y.o.  Logan Wright is managed by Dr. 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 presents to the clinic today for consideration of cycle 3, day 1 of CMF chemotherapy.  We will proceed with his treatment today with the patient to return to clinic on 07/19/2020 as scheduled.  Please see After Visit Summary for patient specific instructions.  Future Appointments  Date Time Provider [place]  07/19/2020 11:15 AM [facility] None  07/19/2020 11:30 AM [facility] [place] FLUSH [facility] None  07/19/2020 12:00 PM Magrinat, [name], MD [facility] None  07/19/2020  1:00 PM [facility] INFUSION [facility] None  01/30/2021  9:30 AM Lavonna Monarch, MD [facility]    No orders of the defined types were placed in this encounter.      Subjective:   Patient ID:  Logan Wright is a 74 y.o. (DOB 25-Aug-1946) male.  Chief Complaint: No chief complaint on file.   HPI Logan Wright  is a 74 y.o. male with a diagnosis of an ER positive malignant neoplasm of the right breast. Mr. Moncada continues to be managed by Dr. Jana Hakim and presents to the clinic today for consideration of cycle 3, day 1 of CMF chemotherapy.  He reports that he is doing well overall with no acute issues of concern.  He denies fevers, chills, sweats, headaches, nausea, vomiting, constipation, or diarrhea.  He states that he is pleasantly surprised at how well he has done with his chemotherapy.  Medications: I have reviewed the patient's current medications.  Allergies: No  Known Allergies  Past Medical History:  Diagnosis Date  . Breast cancer, right ([place])   . CKD (chronic kidney disease), stage III   . Colon adenoma 2015  . Diverticulosis    DENIES   . DJD (degenerative joint disease)   . ED (erectile dysfunction)   . Elevated blood pressure 2007-2008  . Esophageal reflux   . GERD (gastroesophageal reflux disease)   . Gout   . Gynecomastia    LEFT  . Hearing loss   . HTN (hypertension)   . Hypercholesteremia   . Non-cardiac chest pain   . OA (osteoarthritis)    LEFT SHOULDER  . Onychomycosis of foot with other complication   . Pre-diabetes   . Seborrhea   . Severe frontal headaches    RESOLVED   . Vertigo    RESOLVED   . Wears glasses   . Wears hearing aid     Past Surgical History:  Procedure Laterality Date  . COLONSCOPY     . HIP ARTHROPLASTY Bilateral    AT [place]   . IR CV LINE INJECTION  05/14/2020  . IR IMAGING GUIDED PORT INSERTION  05/17/2020  . IR REMOVAL TUN ACCESS W/ PORT W/O FL MOD SED  05/17/2020  . KNEE ARTHROSCOPY     UNSURE WHICH KNEE   . MASTECTOMY W/ SENTINEL NODE BIOPSY Right 03/16/2020   Procedure: RIGHT BREAST MASTECTOMY WITH SENTINEL LYMPH NODE BIOPSY;  Surgeon: [name], MD;  Location: [facility];  Service: General;  Laterality: Right;  . PORTACATH PLACEMENT Left 05/08/2020  Procedure: INSERTION PORT-A-CATH WITH ULTRASOUND;  Surgeon: [name], MD;  Location: [facility];  Service: General;  Laterality: Left;  LMA  . TOTAL KNEE ARTHROPLASTY Left 11/23/2018   Procedure: TOTAL KNEE ARTHROPLASTY;  Surgeon: Paralee Cancel, MD;  Location: [facility];  Service: Orthopedics;  Laterality: Left;  70 mins    Family History  Problem Relation Age of Onset  . Heart attack Mother   . Stroke Mother   . Heart failure Father        85  . Hypertension Neg Hx     Social History   Socioeconomic History  . Marital status: Married    Spouse name: Not on file  . Number of children: Not on file  .  Years of education: Not on file  . Highest education level: Not on file  Occupational History  . Not on file  Tobacco Use  . Smoking status: Never Smoker  . Smokeless tobacco: Never Used  Vaping Use  . Vaping Use: Never used  Substance and Sexual Activity  . Alcohol use: No    Alcohol/week: 0.0 standard drinks  . Drug use: No  . Sexual activity: Not on file  Other Topics Concern  . Not on file  Social History Narrative  . Not on file   Social Determinants of Health   Financial Resource Strain:   . Difficulty of Paying Living Expenses: Not on file  Food Insecurity:   . Worried About Charity fundraiser in the Last Year: Not on file  . Ran Out of Food in the Last Year: Not on file  Transportation Needs:   . Lack of Transportation (Medical): Not on file  . Lack of Transportation (Non-Medical): Not on file  Physical Activity:   . Days of Exercise per Week: Not on file  . Minutes of Exercise per Session: Not on file  Stress:   . Feeling of Stress : Not on file  Social Connections:   . Frequency of Communication with Friends and Family: Not on file  . Frequency of Social Gatherings with Friends and Family: Not on file  . Attends Religious Services: Not on file  . Active Member of Clubs or Organizations: Not on file  . Attends Archivist Meetings: Not on file  . Marital Status: Not on file  Intimate Partner Violence:   . Fear of Current or Ex-Partner: Not on file  . Emotionally Abused: Not on file  . Physically Abused: Not on file  . Sexually Abused: Not on file    Past Medical History, Surgical history, Social history, and Family history were reviewed and updated as appropriate.   Please see review of systems for further details on the patient's review from today.   Review of Systems:  Review of Systems  Constitutional: Negative for chills, diaphoresis and fever.  HENT: Negative for trouble swallowing and voice change.   Respiratory: Negative for cough,  chest tightness, shortness of breath and wheezing.   Cardiovascular: Negative for chest pain and palpitations.  Gastrointestinal: Negative for abdominal pain, constipation, diarrhea, nausea and vomiting.  Musculoskeletal: Negative for back pain and myalgias.  Neurological: Negative for dizziness, light-headedness and headaches.    Objective:   Physical Exam:  There were no vitals taken for this visit. ECOG: 0  Physical Exam Constitutional:      General: He is not in acute distress.    Appearance: He is not diaphoretic.  HENT:     Head: Normocephalic and atraumatic.  Eyes:  General: No scleral icterus.       Right eye: No discharge.        Left eye: No discharge.     Conjunctiva/sclera: Conjunctivae normal.  Cardiovascular:     Rate and Rhythm: Normal rate and regular rhythm.     Heart sounds: Normal heart sounds. No murmur heard.  No friction rub. No gallop.   Pulmonary:     Effort: Pulmonary effort is normal. No respiratory distress.     Breath sounds: Normal breath sounds. No wheezing or rales.  Abdominal:     General: Bowel sounds are normal. There is no distension.     Tenderness: There is no abdominal tenderness. There is no guarding.  Skin:    General: Skin is warm and dry.     Findings: No erythema or rash.  Neurological:     Mental Status: He is alert.  Psychiatric:        Mood and Affect: Mood normal.        Behavior: Behavior normal.        Thought Content: Thought content normal.        Judgment: Judgment normal.     Lab Review:     Component Value Date/Time   NA 142 06/28/2020 1227   K 3.7 06/28/2020 1227   CL 108 06/28/2020 1227   CO2 26 06/28/2020 1227   GLUCOSE 123 (H) 06/28/2020 1227   BUN 20 06/28/2020 1227   CREATININE 1.25 (H) 06/28/2020 1227   CREATININE 1.47 (H) 08/24/2015 1257   CALCIUM 9.3 06/28/2020 1227   PROT 6.6 06/28/2020 1227   ALBUMIN 3.5 06/28/2020 1227   AST 33 06/28/2020 1227   ALT 37 06/28/2020 1227   ALKPHOS 68  06/28/2020 1227   BILITOT 0.6 06/28/2020 1227   GFRNONAA 56 (L) 06/28/2020 1227   GFRAA >60 06/28/2020 1227       Component Value Date/Time   WBC 6.4 06/28/2020 1227   RBC 5.14 06/28/2020 1227   HGB 14.3 06/28/2020 1227   HCT 43.8 06/28/2020 1227   PLT 257 06/28/2020 1227   MCV 85.2 06/28/2020 1227   MCH 27.8 06/28/2020 1227   MCHC 32.6 06/28/2020 1227   RDW 14.5 06/28/2020 1227   LYMPHSABS 1.5 06/28/2020 1227   MONOABS 1.1 (H) 06/28/2020 1227   EOSABS 0.1 06/28/2020 1227   BASOSABS 0.1 06/28/2020 1227   -------------------------------  Imaging from last 24 hours (if applicable):  Radiology interpretation: No results found.    OK to treat.  Sandi Mealy, MHS, PA-C

## 2020-06-28 NOTE — Patient Instructions (Signed)
[facility] Discharge Instructions for Patients Receiving Chemotherapy  Today you received the following chemotherapy agents: cyclophosphamide/methotrexate/fluorouracil.  To help prevent nausea and vomiting after your treatment, we encourage you to take your nausea medication as directed.   If you develop nausea and vomiting that is not controlled by your nausea medication, call the clinic.   BELOW ARE SYMPTOMS THAT SHOULD BE REPORTED IMMEDIATELY:  *FEVER GREATER THAN 100.5 F  *CHILLS WITH OR WITHOUT FEVER  NAUSEA AND VOMITING THAT IS NOT CONTROLLED WITH YOUR NAUSEA MEDICATION  *UNUSUAL SHORTNESS OF BREATH  *UNUSUAL BRUISING OR BLEEDING  TENDERNESS IN MOUTH AND THROAT WITH OR WITHOUT PRESENCE OF ULCERS  *URINARY PROBLEMS  *BOWEL PROBLEMS  UNUSUAL RASH Items with * indicate a potential emergency and should be followed up as soon as possible.  Feel free to call the clinic should you have any questions or concerns. The clinic phone number is [phone].  Please show the CHEMO ALERT CARD at check-in to the Emergency Department and triage nurse.   

## 2020-06-28 NOTE — Progress Notes (Deleted)
Symptoms Management Clinic Progress Note   Logan Wright [id number] Dec 29, 1945 74 y.o.  Madison Hickman is managed by Dr. 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 Sanguinetti presents to the clinic today for consideration of cycle 3 of CMF. He is scheduled to be seen next on 07/19/2020.  Please see After Visit Summary for patient specific instructions.  Future Appointments  Date Time Provider [place]  06/28/2020 11:45 AM [facility] None  06/28/2020 12:00 PM [facility] [place] FLUSH [facility] None  06/28/2020  1:00 PM Aveline Daus E., PA-C [facility] None  06/28/2020  1:00 PM [facility] INFUSION [facility] None  07/19/2020 11:15 AM [facility] LAB [facility] None  07/19/2020 11:30 AM [facility] [place] FLUSH [facility] None  07/19/2020 12:00 PM Causey, Charlestine Massed, NP [facility] None  07/19/2020  1:00 PM [facility] INFUSION [facility] None  01/30/2021  9:30 AM Lavonna Monarch, MD [facility]    No orders of the defined types were placed in this encounter.      Subjective:   Patient ID:  Logan Wright is a 74 y.o. (DOB 10/23/1945) male.  Chief Complaint: No chief complaint on file.   HPI Logan Wright  ***  Medications: {medication reviewed/display:3041432}  Allergies: No Known Allergies  Past Medical History:  Diagnosis Date  . Breast cancer, right ([place])   . CKD (chronic kidney disease), stage III   . Colon adenoma 2015  . Diverticulosis    DENIES   . DJD (degenerative joint disease)   . ED (erectile dysfunction)   . Elevated blood pressure 2007-2008  . Esophageal reflux   . GERD (gastroesophageal  reflux disease)   . Gout   . Gynecomastia    LEFT  . Hearing loss   . HTN (hypertension)   . Hypercholesteremia   . Non-cardiac chest pain   . OA (osteoarthritis)    LEFT SHOULDER  . Onychomycosis of foot with other complication   . Pre-diabetes   . Seborrhea   . Severe frontal headaches    RESOLVED   . Vertigo    RESOLVED   . Wears glasses   . Wears hearing aid     Past Surgical History:  Procedure Laterality Date  . COLONSCOPY     . HIP ARTHROPLASTY Bilateral    AT [place]   . IR CV LINE INJECTION  05/14/2020  . IR IMAGING GUIDED PORT INSERTION  05/17/2020  . IR REMOVAL TUN ACCESS W/ PORT W/O FL MOD SED  05/17/2020  . KNEE ARTHROSCOPY     UNSURE WHICH KNEE   . MASTECTOMY W/ SENTINEL NODE BIOPSY Right 03/16/2020   Procedure: RIGHT BREAST MASTECTOMY WITH SENTINEL LYMPH NODE BIOPSY;  Surgeon: [name], MD;  Location: [facility];  Service: General;  Laterality: Right;  . PORTACATH PLACEMENT Left 05/08/2020   Procedure: INSERTION PORT-A-CATH WITH ULTRASOUND;  Surgeon: [name], MD;  Location: [facility];  Service: General;  Laterality: Left;  LMA  . TOTAL KNEE ARTHROPLASTY Left 11/23/2018   Procedure: TOTAL KNEE ARTHROPLASTY;  Surgeon: Paralee Cancel, MD;  [facility];  Service: Orthopedics;  Laterality: Left;  70 mins    Family History  Problem Relation Age of Onset  .  Heart attack Mother   . Stroke Mother   . Heart failure Father        71  . Hypertension Neg Hx     Social History   Socioeconomic History  . Marital status: Married    Spouse name: Not on file  . Number of children: Not on file  . Years of education: Not on file  . Highest education level: Not on file  Occupational History  . Not on file  Tobacco Use  . Smoking status: Never Smoker  . Smokeless tobacco: Never Used  Vaping Use  . Vaping Use: Never used  Substance and Sexual Activity  . Alcohol use: No    Alcohol/week: 0.0 standard drinks  . Drug use: No  . Sexual  activity: Not on file  Other Topics Concern  . Not on file  Social History Narrative  . Not on file   Social Determinants of Health   Financial Resource Strain:   . Difficulty of Paying Living Expenses: Not on file  Food Insecurity:   . Worried About Charity fundraiser in the Last Year: Not on file  . Ran Out of Food in the Last Year: Not on file  Transportation Needs:   . Lack of Transportation (Medical): Not on file  . Lack of Transportation (Non-Medical): Not on file  Physical Activity:   . Days of Exercise per Week: Not on file  . Minutes of Exercise per Session: Not on file  Stress:   . Feeling of Stress : Not on file  Social Connections:   . Frequency of Communication with Friends and Family: Not on file  . Frequency of Social Gatherings with Friends and Family: Not on file  . Attends Religious Services: Not on file  . Active Member of Clubs or Organizations: Not on file  . Attends Archivist Meetings: Not on file  . Marital Status: Not on file  Intimate Partner Violence:   . Fear of Current or Ex-Partner: Not on file  . Emotionally Abused: Not on file  . Physically Abused: Not on file  . Sexually Abused: Not on file    Past Medical History, Surgical history, Social history, and Family history were reviewed and updated as appropriate.   Please see review of systems for further details on the patient's review from today.   Review of Systems:  Review of Systems  Objective:   Physical Exam:  There were no vitals taken for this visit. ECOG: ***  Physical Exam  Lab Review:     Component Value Date/Time   NA 141 06/06/2020 1330   K 4.0 06/06/2020 1330   CL 104 06/06/2020 1330   CO2 28 06/06/2020 1330   GLUCOSE 94 06/06/2020 1330   BUN 23 06/06/2020 1330   CREATININE 1.35 (H) 06/06/2020 1330   CREATININE 1.47 (H) 08/24/2015 1257   CALCIUM 9.8 06/06/2020 1330   PROT 6.8 06/06/2020 1330   ALBUMIN 3.5 06/06/2020 1330   AST 32 06/06/2020 1330    ALT 35 06/06/2020 1330   ALKPHOS 76 06/06/2020 1330   BILITOT 0.6 06/06/2020 1330   GFRNONAA 51 (L) 06/06/2020 1330   GFRAA 59 (L) 06/06/2020 1330       Component Value Date/Time   WBC 6.0 06/06/2020 1330   RBC 5.38 06/06/2020 1330   HGB 14.9 06/06/2020 1330   HCT 45.1 06/06/2020 1330   PLT 240 06/06/2020 1330   MCV 83.8 06/06/2020 1330   MCH 27.7 06/06/2020 1330  MCHC 33.0 06/06/2020 1330   RDW 13.4 06/06/2020 1330   LYMPHSABS 2.0 06/06/2020 1330   MONOABS 1.1 (H) 06/06/2020 1330   EOSABS 0.1 06/06/2020 1330   BASOSABS 0.1 06/06/2020 1330   -------------------------------  Imaging from last 24 hours (if applicable):  Radiology interpretation: No results found.

## 2020-06-29 ENCOUNTER — Encounter: Payer: Self-pay | Admitting: Oncology

## 2020-07-09 DIAGNOSIS — M1710 Unilateral primary osteoarthritis, unspecified knee: Secondary | ICD-10-CM | POA: Diagnosis not present

## 2020-07-09 DIAGNOSIS — E782 Mixed hyperlipidemia: Secondary | ICD-10-CM | POA: Diagnosis not present

## 2020-07-09 DIAGNOSIS — I10 Essential (primary) hypertension: Secondary | ICD-10-CM | POA: Diagnosis not present

## 2020-07-09 DIAGNOSIS — M1610 Unilateral primary osteoarthritis, unspecified hip: Secondary | ICD-10-CM | POA: Diagnosis not present

## 2020-07-09 DIAGNOSIS — C50921 Malignant neoplasm of unspecified site of right male breast: Secondary | ICD-10-CM | POA: Diagnosis not present

## 2020-07-09 DIAGNOSIS — N183 Chronic kidney disease, stage 3 unspecified: Secondary | ICD-10-CM | POA: Diagnosis not present

## 2020-07-17 DIAGNOSIS — C50921 Malignant neoplasm of unspecified site of right male breast: Secondary | ICD-10-CM | POA: Diagnosis not present

## 2020-07-17 DIAGNOSIS — N183 Chronic kidney disease, stage 3 unspecified: Secondary | ICD-10-CM | POA: Diagnosis not present

## 2020-07-17 DIAGNOSIS — I10 Essential (primary) hypertension: Secondary | ICD-10-CM | POA: Diagnosis not present

## 2020-07-17 DIAGNOSIS — I4891 Unspecified atrial fibrillation: Secondary | ICD-10-CM | POA: Diagnosis not present

## 2020-07-17 DIAGNOSIS — M1710 Unilateral primary osteoarthritis, unspecified knee: Secondary | ICD-10-CM | POA: Diagnosis not present

## 2020-07-17 DIAGNOSIS — M1610 Unilateral primary osteoarthritis, unspecified hip: Secondary | ICD-10-CM | POA: Diagnosis not present

## 2020-07-17 DIAGNOSIS — E782 Mixed hyperlipidemia: Secondary | ICD-10-CM | POA: Diagnosis not present

## 2020-07-18 NOTE — Progress Notes (Signed)
Logan Wright  [phone] [phone]     ID: Logan Wright DOB: 1946/05/17  MR#: [id number]  HOZ#:[id number]  Patient Care Team: [name], MD as PCP - General (Family Medicine) Guillermina Shaft, [name], MD as Consulting Physician (Oncology) [name], MD as Consulting Physician (General Surgery) Kyung Rudd, MD as Consulting Physician (Radiation Oncology) Lavonna Monarch, MD as Consulting Physician (Dermatology) Paralee Cancel, MD as Consulting Physician (Orthopedic Surgery) Mauro Kaufmann, RN as Oncology Nurse Navigator Rockwell Germany, RN as Oncology Nurse Navigator Chauncey Cruel, MD OTHER MD:  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   He continues on adjuvant chemotherapy with CMF. Today is day 1 cycle 4 out of 8 planned.  Recall his left subclavian port had retracted and needed to be revised 05/17/2020.  Since then the port has been working with no complications.  REVIEW OF SYSTEMS: Kerwin has had his Covid vaccine shots including the boosters and he also had his rabies shot because of a bat that was in the house.  He tells me he is tolerating CMF well although he does have some fatigue.  He walks in the morning does some exercising but then by 1030 is feeling tired and he takes a nap after lunch about 45 minutes.  He still sleeps well at night.  He has gained a few pounds and that concerns him.  He requested his BRCA results which I gave him.  He had other concerns today that were a minor.  Overall he is doing terrific and a detailed review of systems was noncontributory   HISTORY OF CURRENT ILLNESS: From the original intake note:  Logan Wright palpated a right breast mass sometime late 2020.  He had a prior left breast mass in the 1990s which was found  to be a fat deposit and he assumed this would be the same.  He did mention it to Dr. Jacelyn Grip at the time of their regular physical and he set [name] up for bilateral diagnostic mammography with tomography and right breast ultrasonography at [facility] on 02/21/2020 showing: breast density category B; 3.2 cm irregular mass at 11 o'clock in the right breast; normal right axillary lymph nodes.  Accordingly on 03/02/2020 the patient proceeded to biopsy of the right breast area in question. The pathology from this procedure ([id number]) showed: invasive ductal carcinoma, grade 2-3. Prognostic indicators significant for: estrogen receptor, 90% positive with strong staining intensity and progesterone receptor, 5% positive with moderate staining intensity. Proliferation marker Ki67 at 15%. HER2 equivocal by immunohistochemistry (2+), but negative by fluorescent in situ hybridization with a signals ratio 1.35 and number per cell 3.1.  He met with Dr. Lisbeth Renshaw on 03/16/2020 to discuss adjuvant radiation therapy. Per consultation note,  postmastectomy radiation was felt to be unlikely.  He proceeded to right mastectomy on 03/16/2020 under Dr. [name]. Pathology from the procedure [phone]) showed: invasive ductal carcinoma, grade 2, 3.2 cm; margins not involved.  All three sentinel lymph nodes removed were negative for carcinoma (0/3).  The patient's subsequent history is as detailed below.   PAST MEDICAL HISTORY: Past Medical History:  Diagnosis Date  . Breast cancer, right ([place])   . CKD (chronic kidney disease), stage III   . Colon adenoma 2015  . Diverticulosis    DENIES   . DJD (degenerative joint disease)   .  ED (erectile dysfunction)   . Elevated blood pressure 2007-2008  . Esophageal reflux   . GERD (gastroesophageal reflux disease)   . Gout   . Gynecomastia    LEFT  . Hearing loss   . HTN (hypertension)   . Hypercholesteremia   . Non-cardiac chest pain   . OA (osteoarthritis)    LEFT  SHOULDER  . Onychomycosis of foot with other complication   . Pre-diabetes   . Seborrhea   . Severe frontal headaches    RESOLVED   . Vertigo    RESOLVED   . Wears glasses   . Wears hearing aid     PAST SURGICAL HISTORY: Past Surgical History:  Procedure Laterality Date  . COLONSCOPY     . HIP ARTHROPLASTY Bilateral    AT [place]   . IR CV LINE INJECTION  05/14/2020  . IR IMAGING GUIDED PORT INSERTION  05/17/2020  . IR REMOVAL TUN ACCESS W/ PORT W/O FL MOD SED  05/17/2020  . KNEE ARTHROSCOPY     UNSURE WHICH KNEE   . MASTECTOMY W/ SENTINEL NODE BIOPSY Right 03/16/2020   Procedure: RIGHT BREAST MASTECTOMY WITH SENTINEL LYMPH NODE BIOPSY;  Surgeon: [name], MD;  Location: [facility];  Service: General;  Laterality: Right;  . PORTACATH PLACEMENT Left 05/08/2020   Procedure: INSERTION PORT-A-CATH WITH ULTRASOUND;  Surgeon: [name], MD;  Location: [facility];  Service: General;  Laterality: Left;  LMA  . TOTAL KNEE ARTHROPLASTY Left 11/23/2018   Procedure: TOTAL KNEE ARTHROPLASTY;  Surgeon: Paralee Cancel, MD;  Location: [facility];  Service: Orthopedics;  Laterality: Left;  70 mins    FAMILY HISTORY: Family History  Problem Relation Age of Onset  . Heart attack Mother   . Stroke Mother   . Heart failure Father        14  . Hypertension Neg Hx   The patient is of Ashkenazi background.  His father died at age 15 and his mother at age 67.  On the father's side, the paternal grandfather died young from unclear causes.  The paternal grandmother did not have cancer.  The patient's father was a single child.  On the maternal side the patient's maternal grandmother died young again from unclear reasons but the paternal grandfather and the 3 maternal aunts were all died in old age with no cancer.  The patient also knows of no cancer in his cousins.  He himself has 1 brother with no history of cancer, no sisters.   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  Previously he used to on [employer] which she sold in 1999.  He is a IT sales professional.  His wife of 44+ years Ivin Booty is very active in the community.  Their daughter Lowella Curb his Engineer, building services for a [facility] psychiatric group.  She lives in [place].  Son Annie Main lives in [place] by himself.  Patient has 3 grandchildren.  He attends [employer]    ADVANCED DIRECTIVES: In the absence of any documentation to the contrary, the patient's spouse is his HCPOA.    HEALTH MAINTENANCE: Social History   Tobacco Use  . Smoking status: Never Smoker  . Smokeless tobacco: Never Used  Vaping Use  . Vaping Use: Never used  Substance Use Topics  . Alcohol use: No    Alcohol/week: 0.0 standard drinks  . Drug use:  No     Colonoscopy: [facility]  Bone density: -  PSA:   No Known Allergies  Current Outpatient Medications  Medication Sig Dispense Refill  . allopurinol (ZYLOPRIM) 100 MG tablet Take 200 mg by mouth daily.    Marland Kitchen atorvastatin (LIPITOR) 40 MG tablet Take 40 mg by mouth daily.    Marland Kitchen dexamethasone (DECADRON) 4 MG tablet Take 2 tablets (8 mg total) by mouth daily. Start the day after chemotherapy for 2 days. Take with food. 30 tablet 1  . GLUCOSAMINE-CHONDROITIN PO Take 2 tablets by mouth daily.    Marland Kitchen HYDROcodone-acetaminophen (NORCO/VICODIN) 5-325 MG tablet Take 1 tablet by mouth every 4 (four) hours as needed for moderate pain. 25 tablet 0  . lidocaine-prilocaine (EMLA) cream Apply to affected area once (Patient taking differently: Apply 1 application topically daily as needed (port access). ) 30 g 3  . LORazepam (ATIVAN) 0.5 MG tablet Take 1 tablet (0.5 mg total) by mouth at bedtime as needed (Nausea or vomiting). 20 tablet 0  . losartan-hydrochlorothiazide (HYZAAR) 50-12.5 MG tablet TAKE 1 TABLET BY MOUTH DAILY. (Patient taking differently: Take 1 tablet  by mouth daily. ) 30 tablet 5  . Multiple Vitamin (MULTIVITAMIN) tablet Take 1 tablet by mouth daily.     . Omega-3 Fatty Acids (FISH OIL ULTRA) 1400 MG CAPS Take 1,400-2,800 mg by mouth See admin instructions. Take 1400 mg in the morning and 2800 mg in the evening    . Potassium 99 MG TABS Take 99 mg by mouth daily.      No current facility-administered medications for this visit.    OBJECTIVE: White man who appears well  Vitals:   07/19/20 1210  BP: 127/74  Pulse: 76  Resp: 17  Temp: (!) 97.1 F (36.2 C)  SpO2: 96%     Body mass index is 35.71 kg/m.   Wt Readings from Last 3 Encounters:  07/19/20 228 lb (103.4 kg)  06/28/20 224 lb 12 oz (101.9 kg)  06/06/20 222 lb 8 oz (100.9 kg)      ECOG FS:1 - Symptomatic but completely ambulatory  Sclerae unicteric, EOMs intact Wearing a mask No cervical or supraclavicular adenopathy Lungs no rales or rhonchi Heart regular rate and rhythm Abd soft, nontender, positive bowel sounds MSK no focal spinal tenderness, no upper extremity lymphedema Neuro: nonfocal, well oriented, appropriate affect Breasts: The right breast is status post mastectomy.  There is no evidence of local recurrence.  The left breast shows gynecomastia.  There is no palpable mass.  Both axillae are benign.   LAB RESULTS:  CMP     Component Value Date/Time   NA 142 06/28/2020 1227   K 3.7 06/28/2020 1227   CL 108 06/28/2020 1227   CO2 26 06/28/2020 1227   GLUCOSE 123 (H) 06/28/2020 1227   BUN 20 06/28/2020 1227   CREATININE 1.25 (H) 06/28/2020 1227   CREATININE 1.47 (H) 08/24/2015 1257   CALCIUM 9.3 06/28/2020 1227   PROT 6.6 06/28/2020 1227   ALBUMIN 3.5 06/28/2020 1227   AST 33 06/28/2020 1227   ALT 37 06/28/2020 1227   ALKPHOS 68 06/28/2020 1227   BILITOT 0.6 06/28/2020 1227   GFRNONAA 56 (L) 06/28/2020 1227   GFRAA >60 06/28/2020 1227    No results found for: TOTALPROTELP, ALBUMINELP, A1GS, A2GS, BETS, BETA2SER, GAMS, MSPIKE, SPEI  Lab  Results  Component Value Date   WBC 4.8 07/19/2020   NEUTROABS PENDING 07/19/2020   HGB 14.2 07/19/2020   HCT 43.6 07/19/2020  MCV 83.2 07/19/2020   PLT 293 07/19/2020    No results found for: LABCA2  No components found for: SLHTDS287  No results for input(s): INR in the last 168 hours.  No results found for: LABCA2  No results found for: GOT157  No results found for: WIO035  No results found for: DHR416  No results found for: CA2729  No components found for: HGQUANT  No results found for: CEA1 / No results found for: CEA1   No results found for: AFPTUMOR  No results found for: CHROMOGRNA  No results found for: KPAFRELGTCHN, LAMBDASER, KAPLAMBRATIO (kappa/lambda light chains)  No results found for: HGBA, HGBA2QUANT, HGBFQUANT, HGBSQUAN (Hemoglobinopathy evaluation)   No results found for: LDH  No results found for: IRON, TIBC, IRONPCTSAT (Iron and TIBC)  No results found for: FERRITIN  Urinalysis No results found for: COLORURINE, APPEARANCEUR, LABSPEC, PHURINE, GLUCOSEU, HGBUR, BILIRUBINUR, KETONESUR, PROTEINUR, UROBILINOGEN, NITRITE, LEUKOCYTESUR   STUDIES: No results found.   ELIGIBLE FOR AVAILABLE RESEARCH PROTOCOL: no  ASSESSMENT: 74 y.o. [place] man status post right breast upper outer quadrant lumpectomy 03/02/2020 for a pT2 pN0, stage IIA invasive ductal carcinoma, grade 2, with negative margins; estrogen receptor strongly positive, progesterone receptor moderately positive, HER-2 negative by FISH, MIB-1 of 15%  (a) a total of 3 sentinel lymph nodes were removed  (1) Oncotype score of 40 predicts a risk of recurrence outside the breast within the next 9 years of 28% if the patient's only systemic therapy is antiestrogens for 5 years.  It also predicts a significant benefit from adjuvant chemotherapy.  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 STK11, TP53, TSC1, TSC2, and VHL.  The following genes were evaluated for sequence changes only: SDHA and HOXB13 c.251G>A variant only.   (4) tamoxifen to start at the completion of chemotherapy   PLAN: Watt is tolerating his CMF chemotherapy remarkably well and he will proceed to cycle #4 today.  I am scheduling him for the last 4 cycles with no changes.  Weight gain is very common during chemotherapy.  We discussed avoiding carbohydrates and increasing vegetables.  He has a good exercise program already.  I reassured him that he is not unusually fatigued from these treatments and that what little fatigue is attributable to the treatment will completely resolve within 2 months of discontinuation of chemotherapy  He will see me again in 3 weeks.  He knows to call for any other issue that may develop before then  Encounter time 25 minutes.Sarajane Jews C. Dodge Ator, MD 07/19/20 12:20 PM Medical Oncology and Hematology [facility] [place], [place] [address] Tel. [phone]    Fax. [phone]   I, Wilburn Mylar, am acting as scribe for Dr. [name]. Anelle Parlow.      *Total Encounter Time as defined by the Centers  for Medicare and Medicaid Services includes, in addition to the  face-to-face time of a patient visit (documented in the note above) non-face-to-face time: obtaining and reviewing outside history, ordering and reviewing medications, tests or procedures, care coordination (communications with other health care professionals or caregivers) and documentation in the medical record.

## 2020-07-19 ENCOUNTER — Encounter: Payer: Self-pay | Admitting: *Deleted

## 2020-07-19 ENCOUNTER — Inpatient Hospital Stay: Payer: PPO

## 2020-07-19 ENCOUNTER — Other Ambulatory Visit: Payer: Self-pay

## 2020-07-19 ENCOUNTER — Inpatient Hospital Stay (HOSPITAL_BASED_OUTPATIENT_CLINIC_OR_DEPARTMENT_OTHER): Payer: PPO | Admitting: Oncology

## 2020-07-19 ENCOUNTER — Inpatient Hospital Stay: Payer: PPO | Attending: Oncology

## 2020-07-19 VITALS — BP 127/74 | HR 76 | Temp 97.1°F | Resp 17 | Ht 67.0 in | Wt 228.0 lb

## 2020-07-19 DIAGNOSIS — Z5111 Encounter for antineoplastic chemotherapy: Secondary | ICD-10-CM | POA: Insufficient documentation

## 2020-07-19 DIAGNOSIS — Z170 Estrogen receptor positive status [ER+]: Secondary | ICD-10-CM | POA: Insufficient documentation

## 2020-07-19 DIAGNOSIS — C50421 Malignant neoplasm of upper-outer quadrant of right male breast: Secondary | ICD-10-CM | POA: Insufficient documentation

## 2020-07-19 DIAGNOSIS — Z9011 Acquired absence of right breast and nipple: Secondary | ICD-10-CM | POA: Diagnosis not present

## 2020-07-19 DIAGNOSIS — Z95828 Presence of other vascular implants and grafts: Secondary | ICD-10-CM

## 2020-07-19 LAB — COMPREHENSIVE METABOLIC PANEL
ALT: 46 U/L — ABNORMAL HIGH (ref 0–44)
AST: 39 U/L (ref 15–41)
Albumin: 3.3 g/dL — ABNORMAL LOW (ref 3.5–5.0)
Alkaline Phosphatase: 78 U/L (ref 38–126)
Anion gap: 6 (ref 5–15)
BUN: 23 mg/dL (ref 8–23)
CO2: 27 mmol/L (ref 22–32)
Calcium: 9.1 mg/dL (ref 8.9–10.3)
Chloride: 107 mmol/L (ref 98–111)
Creatinine, Ser: 1.47 mg/dL — ABNORMAL HIGH (ref 0.61–1.24)
GFR calc non Af Amer: 46 mL/min — ABNORMAL LOW (ref >60)
Glucose, Bld: 170 mg/dL — ABNORMAL HIGH (ref 70–99)
Potassium: 3.9 mmol/L (ref 3.5–5.1)
Sodium: 140 mmol/L (ref 135–145)
Total Bilirubin: 0.4 mg/dL (ref 0.3–1.2)
Total Protein: 6.5 g/dL (ref 6.5–8.1)

## 2020-07-19 LAB — CBC WITH DIFFERENTIAL/PLATELET
Abs Immature Granulocytes: 0.28 10*3/uL — ABNORMAL HIGH (ref 0.00–0.07)
Basophils Absolute: 0.1 10*3/uL (ref 0.0–0.1)
Basophils Relative: 2 %
Eosinophils Absolute: 0.1 10*3/uL (ref 0.0–0.5)
Eosinophils Relative: 2 %
HCT: 43.6 % (ref 39.0–52.0)
Hemoglobin: 14.2 g/dL (ref 13.0–17.0)
Immature Granulocytes: 6 %
Lymphocytes Relative: 28 %
Lymphs Abs: 1.4 10*3/uL (ref 0.7–4.0)
MCH: 27.1 pg (ref 26.0–34.0)
MCHC: 32.6 g/dL (ref 30.0–36.0)
MCV: 83.2 fL (ref 80.0–100.0)
Monocytes Absolute: 1 10*3/uL (ref 0.1–1.0)
Monocytes Relative: 21 %
Neutro Abs: 2 10*3/uL (ref 1.7–7.7)
Neutrophils Relative %: 41 %
Platelets: 293 10*3/uL (ref 150–400)
RBC: 5.24 MIL/uL (ref 4.22–5.81)
RDW: 14.7 % (ref 11.5–15.5)
WBC: 4.8 10*3/uL (ref 4.0–10.5)
nRBC: 0 % (ref 0.0–0.2)

## 2020-07-19 MED ORDER — SODIUM CHLORIDE 0.9% FLUSH
10.0000 mL | INTRAVENOUS | Status: DC | PRN
  Administered 2020-07-19: 10 mL
  Filled 2020-07-19: qty 10

## 2020-07-19 MED ORDER — FLUOROURACIL CHEMO INJECTION 2.5 GM/50ML
600.0000 mg/m2 | Freq: Once | INTRAVENOUS | Status: AC
  Administered 2020-07-19: 1300 mg via INTRAVENOUS
  Filled 2020-07-19: qty 26

## 2020-07-19 MED ORDER — SODIUM CHLORIDE 0.9% FLUSH
10.0000 mL | Freq: Once | INTRAVENOUS | Status: AC
  Administered 2020-07-19: 10 mL
  Filled 2020-07-19: qty 10

## 2020-07-19 MED ORDER — SODIUM CHLORIDE 0.9 % IV SOLN
600.0000 mg/m2 | Freq: Once | INTRAVENOUS | Status: AC
  Administered 2020-07-19: 1320 mg via INTRAVENOUS
  Filled 2020-07-19: qty 66

## 2020-07-19 MED ORDER — HEPARIN SOD (PORK) LOCK FLUSH 100 UNIT/ML IV SOLN
500.0000 [IU] | Freq: Once | INTRAVENOUS | Status: AC | PRN
  Administered 2020-07-19: 500 [IU]
  Filled 2020-07-19: qty 5

## 2020-07-19 MED ORDER — PROCHLORPERAZINE EDISYLATE 10 MG/2ML IJ SOLN
10.0000 mg | Freq: Once | INTRAMUSCULAR | Status: AC
  Administered 2020-07-19: 10 mg via INTRAVENOUS

## 2020-07-19 MED ORDER — PALONOSETRON HCL INJECTION 0.25 MG/5ML
0.2500 mg | Freq: Once | INTRAVENOUS | Status: AC
  Administered 2020-07-19: 0.25 mg via INTRAVENOUS

## 2020-07-19 MED ORDER — SODIUM CHLORIDE 0.9 % IV SOLN
10.0000 mg | Freq: Once | INTRAVENOUS | Status: AC
  Administered 2020-07-19: 10 mg via INTRAVENOUS
  Filled 2020-07-19: qty 10

## 2020-07-19 MED ORDER — METHOTREXATE SODIUM (PF) CHEMO INJECTION 250 MG/10ML
40.0000 mg/m2 | Freq: Once | INTRAMUSCULAR | Status: AC
  Administered 2020-07-19: 87.5 mg via INTRAVENOUS
  Filled 2020-07-19: qty 3.5

## 2020-07-19 MED ORDER — PALONOSETRON HCL INJECTION 0.25 MG/5ML
INTRAVENOUS | Status: AC
  Filled 2020-07-19: qty 5

## 2020-07-19 MED ORDER — PROCHLORPERAZINE EDISYLATE 10 MG/2ML IJ SOLN
INTRAMUSCULAR | Status: AC
  Filled 2020-07-19: qty 2

## 2020-07-19 MED ORDER — SODIUM CHLORIDE 0.9 % IV SOLN
Freq: Once | INTRAVENOUS | Status: AC
  Filled 2020-07-19: qty 250

## 2020-07-20 ENCOUNTER — Encounter: Payer: Self-pay | Admitting: Oncology

## 2020-07-22 ENCOUNTER — Emergency Department (HOSPITAL_COMMUNITY): Payer: PPO

## 2020-07-22 ENCOUNTER — Other Ambulatory Visit: Payer: Self-pay

## 2020-07-22 ENCOUNTER — Other Ambulatory Visit (HOSPITAL_COMMUNITY): Payer: PPO

## 2020-07-22 ENCOUNTER — Observation Stay (HOSPITAL_COMMUNITY)
Admission: EM | Admit: 2020-07-22 | Discharge: 2020-07-23 | Disposition: A | Discharge status: Home / Self Care | Payer: PPO | Attending: Internal Medicine | Admitting: Internal Medicine

## 2020-07-22 DIAGNOSIS — R0602 Shortness of breath: Secondary | ICD-10-CM | POA: Diagnosis not present

## 2020-07-22 DIAGNOSIS — K219 Gastro-esophageal reflux disease without esophagitis: Secondary | ICD-10-CM | POA: Diagnosis present

## 2020-07-22 DIAGNOSIS — R0600 Dyspnea, unspecified: Secondary | ICD-10-CM | POA: Insufficient documentation

## 2020-07-22 DIAGNOSIS — C50919 Malignant neoplasm of unspecified site of unspecified female breast: Secondary | ICD-10-CM | POA: Diagnosis not present

## 2020-07-22 DIAGNOSIS — Z79899 Other long term (current) drug therapy: Secondary | ICD-10-CM | POA: Insufficient documentation

## 2020-07-22 DIAGNOSIS — Z170 Estrogen receptor positive status [ER+]: Secondary | ICD-10-CM

## 2020-07-22 DIAGNOSIS — Z853 Personal history of malignant neoplasm of breast: Secondary | ICD-10-CM | POA: Diagnosis not present

## 2020-07-22 DIAGNOSIS — I480 Paroxysmal atrial fibrillation: Secondary | ICD-10-CM | POA: Diagnosis present

## 2020-07-22 DIAGNOSIS — Z96652 Presence of left artificial knee joint: Secondary | ICD-10-CM | POA: Insufficient documentation

## 2020-07-22 DIAGNOSIS — C50421 Malignant neoplasm of upper-outer quadrant of right male breast: Secondary | ICD-10-CM

## 2020-07-22 DIAGNOSIS — R000 Tachycardia, unspecified: Secondary | ICD-10-CM | POA: Diagnosis not present

## 2020-07-22 DIAGNOSIS — R0902 Hypoxemia: Secondary | ICD-10-CM | POA: Diagnosis not present

## 2020-07-22 DIAGNOSIS — I129 Hypertensive chronic kidney disease with stage 1 through stage 4 chronic kidney disease, or unspecified chronic kidney disease: Secondary | ICD-10-CM | POA: Diagnosis not present

## 2020-07-22 DIAGNOSIS — R7303 Prediabetes: Secondary | ICD-10-CM | POA: Insufficient documentation

## 2020-07-22 DIAGNOSIS — I4891 Unspecified atrial fibrillation: Secondary | ICD-10-CM | POA: Diagnosis not present

## 2020-07-22 DIAGNOSIS — N183 Chronic kidney disease, stage 3 unspecified: Secondary | ICD-10-CM | POA: Diagnosis not present

## 2020-07-22 DIAGNOSIS — R0789 Other chest pain: Secondary | ICD-10-CM | POA: Diagnosis not present

## 2020-07-22 DIAGNOSIS — I10 Essential (primary) hypertension: Secondary | ICD-10-CM | POA: Diagnosis not present

## 2020-07-22 DIAGNOSIS — E785 Hyperlipidemia, unspecified: Secondary | ICD-10-CM | POA: Diagnosis present

## 2020-07-22 DIAGNOSIS — I517 Cardiomegaly: Secondary | ICD-10-CM | POA: Diagnosis not present

## 2020-07-22 DIAGNOSIS — Z20822 Contact with and (suspected) exposure to covid-19: Secondary | ICD-10-CM | POA: Insufficient documentation

## 2020-07-22 DIAGNOSIS — Z9011 Acquired absence of right breast and nipple: Secondary | ICD-10-CM

## 2020-07-22 DIAGNOSIS — R079 Chest pain, unspecified: Secondary | ICD-10-CM | POA: Diagnosis present

## 2020-07-22 LAB — CBC
HCT: 39.9 % (ref 39.0–52.0)
Hemoglobin: 12.8 g/dL — ABNORMAL LOW (ref 13.0–17.0)
MCH: 27.8 pg (ref 26.0–34.0)
MCHC: 32.1 g/dL (ref 30.0–36.0)
MCV: 86.7 fL (ref 80.0–100.0)
Platelets: 237 10*3/uL (ref 150–400)
RBC: 4.6 MIL/uL (ref 4.22–5.81)
RDW: 14.8 % (ref 11.5–15.5)
WBC: 9.3 10*3/uL (ref 4.0–10.5)
nRBC: 0 % (ref 0.0–0.2)

## 2020-07-22 LAB — BASIC METABOLIC PANEL
Anion gap: 11 (ref 5–15)
BUN: 35 mg/dL — ABNORMAL HIGH (ref 8–23)
CO2: 22 mmol/L (ref 22–32)
Calcium: 8.6 mg/dL — ABNORMAL LOW (ref 8.9–10.3)
Chloride: 106 mmol/L (ref 98–111)
Creatinine, Ser: 1.55 mg/dL — ABNORMAL HIGH (ref 0.61–1.24)
GFR, Estimated: 43 mL/min — ABNORMAL LOW (ref >60)
Glucose, Bld: 122 mg/dL — ABNORMAL HIGH (ref 70–99)
Potassium: 3.7 mmol/L (ref 3.5–5.1)
Sodium: 139 mmol/L (ref 135–145)

## 2020-07-22 LAB — TROPONIN I (HIGH SENSITIVITY)
Troponin I (High Sensitivity): 83 ng/L — ABNORMAL HIGH (ref <18)
Troponin I (High Sensitivity): 82 ng/L — ABNORMAL HIGH (ref <18)
Troponin I (High Sensitivity): 68 ng/L — ABNORMAL HIGH (ref <18)
Troponin I (High Sensitivity): 65 ng/L — ABNORMAL HIGH (ref <18)

## 2020-07-22 LAB — RESPIRATORY PANEL BY RT PCR (FLU A&B, COVID)
Influenza A by PCR: NEGATIVE
Influenza B by PCR: NEGATIVE
SARS Coronavirus 2 by RT PCR: NEGATIVE

## 2020-07-22 LAB — HEMOGLOBIN AND HEMATOCRIT, BLOOD
HCT: 39.1 % (ref 39.0–52.0)
Hemoglobin: 12.2 g/dL — ABNORMAL LOW (ref 13.0–17.0)

## 2020-07-22 LAB — BRAIN NATRIURETIC PEPTIDE: B Natriuretic Peptide: 431.3 pg/mL — ABNORMAL HIGH (ref 0.0–100.0)

## 2020-07-22 MED ORDER — SODIUM CHLORIDE 0.9% FLUSH: 10.0000 mL | INTRAVENOUS | Status: DC | PRN

## 2020-07-22 MED ORDER — ACETAMINOPHEN 325 MG PO TABS: 650.0000 mg | ORAL_TABLET | Freq: Four times a day (QID) | ORAL | Status: DC | PRN

## 2020-07-22 MED ORDER — CHLORHEXIDINE GLUCONATE CLOTH 2 % EX PADS
6.0000 | MEDICATED_PAD | Freq: Every day | CUTANEOUS | Status: DC
  Administered 2020-07-23: 6 via TOPICAL

## 2020-07-22 MED ORDER — SODIUM CHLORIDE 0.9% FLUSH
3.0000 mL | Freq: Two times a day (BID) | INTRAVENOUS | Status: DC
  Administered 2020-07-23: 3 mL via INTRAVENOUS

## 2020-07-22 MED ORDER — ATORVASTATIN CALCIUM 40 MG PO TABS
40.0000 mg | ORAL_TABLET | Freq: Every day | ORAL | Status: DC
  Administered 2020-07-22 – 2020-07-23 (×2): 40 mg via ORAL
  Filled 2020-07-22 (×2): qty 1

## 2020-07-22 MED ORDER — DILTIAZEM HCL-DEXTROSE 125-5 MG/125ML-% IV SOLN (PREMIX)
5.0000 mg/h | INTRAVENOUS | Status: DC
  Administered 2020-07-22: 5 mg/h via INTRAVENOUS
  Administered 2020-07-23: 7.5 mg/h via INTRAVENOUS
  Filled 2020-07-22 (×2): qty 125

## 2020-07-22 MED ORDER — DILTIAZEM HCL 25 MG/5ML IV SOLN: 10.0000 mg | Freq: Once | INTRAVENOUS | Status: DC

## 2020-07-22 MED ORDER — DILTIAZEM HCL 25 MG/5ML IV SOLN
10.0000 mg | Freq: Once | INTRAVENOUS | Status: AC
  Administered 2020-07-22: 10 mg via INTRAVENOUS
  Filled 2020-07-22: qty 5

## 2020-07-22 MED ORDER — SODIUM CHLORIDE 0.9 % IV SOLN: INTRAVENOUS | Status: DC

## 2020-07-22 MED ORDER — IOHEXOL 350 MG/ML SOLN
100.0000 mL | Freq: Once | INTRAVENOUS | Status: AC | PRN
  Administered 2020-07-22: 100 mL via INTRAVENOUS

## 2020-07-22 MED ORDER — SODIUM CHLORIDE 0.9 % IV BOLUS
500.0000 mL | Freq: Once | INTRAVENOUS | Status: AC
  Administered 2020-07-22: 500 mL via INTRAVENOUS

## 2020-07-22 MED ORDER — SODIUM CHLORIDE 0.9% FLUSH
10.0000 mL | Freq: Two times a day (BID) | INTRAVENOUS | Status: DC
  Administered 2020-07-22 (×2): 10 mL
  Administered 2020-07-23: 20 mL
  Administered 2020-07-23: 10 mL

## 2020-07-22 MED ORDER — SODIUM CHLORIDE 0.9 % IV SOLN: 250.0000 mL | INTRAVENOUS | Status: DC | PRN

## 2020-07-22 MED ORDER — POLYETHYLENE GLYCOL 3350 17 G PO PACK: 17.0000 g | PACK | Freq: Every day | ORAL | Status: DC | PRN

## 2020-07-22 MED ORDER — ALLOPURINOL 100 MG PO TABS
200.0000 mg | ORAL_TABLET | Freq: Every day | ORAL | Status: DC
  Administered 2020-07-22 – 2020-07-23 (×2): 200 mg via ORAL
  Filled 2020-07-22 (×3): qty 2

## 2020-07-22 MED ORDER — SODIUM CHLORIDE 0.9% FLUSH: 3.0000 mL | INTRAVENOUS | Status: DC | PRN

## 2020-07-22 MED ORDER — DILTIAZEM HCL 25 MG/5ML IV SOLN
5.0000 mg | Freq: Once | INTRAVENOUS | Status: AC
  Administered 2020-07-22: 5 mg via INTRAVENOUS
  Filled 2020-07-22: qty 5

## 2020-07-22 MED ORDER — ASPIRIN EC 81 MG PO TBEC
81.0000 mg | DELAYED_RELEASE_TABLET | Freq: Every day | ORAL | Status: DC
  Administered 2020-07-23: 81 mg via ORAL
  Filled 2020-07-22 (×2): qty 1

## 2020-07-22 MED ORDER — ACETAMINOPHEN 650 MG RE SUPP: 650.0000 mg | Freq: Four times a day (QID) | RECTAL | Status: DC | PRN

## 2020-07-22 MED ORDER — APIXABAN 5 MG PO TABS
5.0000 mg | ORAL_TABLET | Freq: Two times a day (BID) | ORAL | Status: DC
  Administered 2020-07-22 – 2020-07-23 (×2): 5 mg via ORAL
  Filled 2020-07-22 (×3): qty 1

## 2020-07-22 MED ORDER — LORAZEPAM 0.5 MG PO TABS: 0.5000 mg | ORAL_TABLET | Freq: Every evening | ORAL | Status: DC | PRN

## 2020-07-22 MED ORDER — SODIUM CHLORIDE 0.9 % IV BOLUS
1000.0000 mL | Freq: Once | INTRAVENOUS | Status: AC
  Administered 2020-07-22: 1000 mL via INTRAVENOUS

## 2020-07-22 NOTE — ED Notes (Signed)
MD Jamse Arn made aware of pt b/p 97/72 prior to hanging dilt. Nurse was told to proceed with dilt and to hang 1,000 cc bolus.

## 2020-07-22 NOTE — ED Triage Notes (Signed)
BIB [facility] after pt reported increase in weakness, dyspnea, and chest pain over the last 3 days. Upon arrival, EMS reports that pt has new onset afib with HR 100-180. Pt was given 325 mg of aspirin on ambulance. Pt has hx of breast cancer. Pt has port on Left side.   Vitals 02 97% RA HR 137 RR 30 B/P 130/65

## 2020-07-22 NOTE — ED Provider Notes (Signed)
Seen after prior ED provider.  Case discussed with hospitalist who will evaluate for admission.  Dr. Jamse Arn requests Cardizem drip for rate control.   Valarie Merino, MD 07/22/20 [phone]

## 2020-07-22 NOTE — ED Provider Notes (Signed)
[facility] Emergency Department Provider Note MRN:  [id number]  Arrival date & time: 07/22/20     Chief Complaint   Chest Pain   History of Present Illness   Logan Wright is a 74 y.o. year-old male with a history of CKD, breast cancer presenting to the ED with chief complaint of chest pain.  Two or 3 days of intermittent central and left-sided chest pain, described as a tightness, worse with deep breaths. Also found to be in A. fib with RVR on arrival to the ED. Denies shortness of breath, no leg pain or swelling, no fever, no cough, no abdominal pain, no other symptoms. Pain in the chest is mild to moderate.  Review of Systems  A complete 10 system review of systems was obtained and all systems are negative except as noted in the HPI and PMH.   Patient's Health History    Past Medical History:  Diagnosis Date  . Breast cancer, right ([place])   . CKD (chronic kidney disease), stage III   . Colon adenoma 2015  . Diverticulosis    DENIES   . DJD (degenerative joint disease)   . ED (erectile dysfunction)   . Elevated blood pressure 2007-2008  . Esophageal reflux   . GERD (gastroesophageal reflux disease)   . Gout   . Gynecomastia    LEFT  . Hearing loss   . HTN (hypertension)   . Hypercholesteremia   . Non-cardiac chest pain   . OA (osteoarthritis)    LEFT SHOULDER  . Onychomycosis of foot with other complication   . Pre-diabetes   . Seborrhea   . Severe frontal headaches    RESOLVED   . Vertigo    RESOLVED   . Wears glasses   . Wears hearing aid     Past Surgical History:  Procedure Laterality Date  . COLONSCOPY     . HIP ARTHROPLASTY Bilateral    AT [place]   . IR CV LINE INJECTION  05/14/2020  . IR IMAGING GUIDED PORT INSERTION  05/17/2020  . IR REMOVAL TUN ACCESS W/ PORT W/O FL MOD SED  05/17/2020  . KNEE ARTHROSCOPY     UNSURE WHICH KNEE   . MASTECTOMY W/ SENTINEL NODE BIOPSY Right 03/16/2020   Procedure: RIGHT BREAST MASTECTOMY WITH  SENTINEL LYMPH NODE BIOPSY;  Surgeon: [name], MD;  Location: [facility];  Service: General;  Laterality: Right;  . PORTACATH PLACEMENT Left 05/08/2020   Procedure: INSERTION PORT-A-CATH WITH ULTRASOUND;  Surgeon: [name], MD;  Location: [facility];  Service: General;  Laterality: Left;  LMA  . TOTAL KNEE ARTHROPLASTY Left 11/23/2018   Procedure: TOTAL KNEE ARTHROPLASTY;  Surgeon: Paralee Cancel, MD;  [facility];  Service: Orthopedics;  Laterality: Left;  70 mins    Family History  Problem Relation Age of Onset  . Heart attack Mother   . Stroke Mother   . Heart failure Father        38  . Hypertension Neg Hx     Social History   Socioeconomic History  . Marital status: Married    Spouse name: Not on file  . Number of children: Not on file  . Years of education: Not on file  . Highest education level: Not on file  Occupational History  . Not on file  Tobacco Use  . Smoking status: Never Smoker  . Smokeless tobacco: Never Used  Vaping Use  . Vaping Use: Never used  Substance  and Sexual Activity  . Alcohol use: No    Alcohol/week: 0.0 standard drinks  . Drug use: No  . Sexual activity: Not on file  Other Topics Concern  . Not on file  Social History Narrative  . Not on file   Social Determinants of Health   Financial Resource Strain:   . Difficulty of Paying Living Expenses: Not on file  Food Insecurity:   . Worried About Charity fundraiser in the Last Year: Not on file  . Ran Out of Food in the Last Year: Not on file  Transportation Needs:   . Lack of Transportation (Medical): Not on file  . Lack of Transportation (Non-Medical): Not on file  Physical Activity:   . Days of Exercise per Week: Not on file  . Minutes of Exercise per Session: Not on file  Stress:   . Feeling of Stress : Not on file  Social Connections:   . Frequency of Communication with Friends and Family: Not on file  . Frequency of Social Gatherings with Friends  and Family: Not on file  . Attends Religious Services: Not on file  . Active Member of Clubs or Organizations: Not on file  . Attends Archivist Meetings: Not on file  . Marital Status: Not on file  Intimate Partner Violence:   . Fear of Current or Ex-Partner: Not on file  . Emotionally Abused: Not on file  . Physically Abused: Not on file  . Sexually Abused: Not on file     Physical Exam   Vitals:   07/22/20 0615 07/22/20 0630  BP: 106/62 98/69  Pulse: (!) 55 84  Resp: (!) 32 (!) 31  Temp:    SpO2: 95% 97%    CONSTITUTIONAL: Well-appearing, NAD NEURO:  Alert and oriented x 3, no focal deficits EYES:  eyes equal and reactive ENT/NECK:  no LAD, no JVD CARDIO: Tachycardic and irregular rate, well-perfused, normal S1 and S2 PULM:  CTAB no wheezing or rhonchi GI/GU:  normal bowel sounds, non-distended, non-tender MSK/SPINE:  No gross deformities, no edema SKIN:  no rash, atraumatic PSYCH:  Appropriate speech and behavior  *Additional and/or pertinent findings included in MDM below  Diagnostic and Interventional Summary    EKG Interpretation  Date/Time:  July 22, 2020 at 03: 10: 19 Ventricular Rate:  120 PR Interval:    QRS Duration: 87 QT Interval:  316 QTC Calculation: 447 R Axis:     Text Interpretation: Atrial fibrillation with rapid ventricular response Confirmed by Dr. Gerlene Fee at 3:31 AM      Labs Reviewed  CBC - Abnormal; Notable for the following components:      Result Value   Hemoglobin 12.8 (*)    All other components within normal limits  BASIC METABOLIC PANEL - Abnormal; Notable for the following components:   Glucose, Bld 122 (*)    BUN 35 (*)    Creatinine, Ser 1.55 (*)    Calcium 8.6 (*)    GFR, Estimated 43 (*)    All other components within normal limits  BRAIN NATRIURETIC PEPTIDE - Abnormal; Notable for the following components:   B Natriuretic Peptide 431.3 (*)    All other components within normal limits  TROPONIN I  (HIGH SENSITIVITY) - Abnormal; Notable for the following components:   Troponin I (High Sensitivity) 83 (*)    All other components within normal limits  TROPONIN I (HIGH SENSITIVITY)    DG Chest [facility] 1 View  Final Result  CT ANGIO CHEST PE W OR WO CONTRAST    (Results Pending)    Medications  sodium chloride flush (NS) 0.9 % injection 10-40 mL (10 mLs Intracatheter Given 07/22/20 0351)  sodium chloride flush (NS) 0.9 % injection 10-40 mL (has no administration in time range)  Chlorhexidine Gluconate Cloth 2 % PADS 6 each (has no administration in time range)  sodium chloride 0.9 % bolus 500 mL (0 mLs Intravenous Stopped 07/22/20 0438)  diltiazem (CARDIZEM) injection 10 mg (10 mg Intravenous Given 07/22/20 0345)  sodium chloride 0.9 % bolus 500 mL (500 mLs Intravenous New Bag/Given 07/22/20 0504)  iohexol (OMNIPAQUE) 350 MG/ML injection 100 mL (100 mLs Intravenous Contrast Given 07/22/20 0549)     Procedures  /  Critical Care .Critical Care Performed by: Maudie Flakes, MD Authorized by: Maudie Flakes, MD   Critical care provider statement:    Critical care time (minutes):  32   Critical care was necessary to treat or prevent imminent or life-threatening deterioration of the following conditions: A. fib with RVR.   Critical care was time spent personally by me on the following activities:  Discussions with consultants, evaluation of patient's response to treatment, examination of patient, ordering and performing treatments and interventions, ordering and review of laboratory studies, ordering and review of radiographic studies, pulse oximetry, re-evaluation of patient's condition, obtaining history from patient or surrogate and review of old charts    ED Course and Medical Decision Making  I have reviewed the triage vital signs, the nursing notes, and pertinent available records from the EMR.  Listed above are laboratory and imaging tests that I personally ordered, reviewed,  and interpreted and then considered in my medical decision making (see below for details).  New onset A. fib with RVR, rates between 120 and 140, normotensive well-appearing in no acute distress. This in the setting of 2 to 3 days of chest pain that is pleuritic. History of cancer, plan is to obtain CTA to exclude PE. Also considering ACS. History of CKD but GFR has been between 40 and 50 recently.     Awaiting CTA results and second troponin, patient continues to be tachycardic in A. fib with RVR with soft blood pressure, anticipating need for admission to cardiology versus medicine service.  Signed out to oncoming provider shift change.  Barth Kirks. Sedonia Small, [place] [email or web address]  Final Clinical Impressions(s) / ED Diagnoses     ICD-10-CM   1. Chest pain, unspecified type  R07.9   2. Atrial fibrillation with RVR ([place])  I48.91     ED Discharge Orders    None       Discharge Instructions Discussed with and Provided to Patient:   Discharge Instructions   None       Maudie Flakes, MD 07/22/20 [id number]

## 2020-07-22 NOTE — Consult Note (Addendum)
Cardiology Consult:   Patient ID: Logan Wright MRN: [id number]; DOB: 07/28/1946   Admission date: 07/22/2020  Primary Care Provider: [name], MD [facility] Cardiologist: Mertie Moores, MD  [place] Electrophysiologist:  None   Chief Complaint:  Chest pain/ Fatigue  Consulting physician: Dr. Jamse Arn  Patient Profile:   Logan Wright is a 74 y.o. male with past medical history of right breast cancer status post mastectomy, CKD stage III, HTN, HLD and prediabetes presented with chest pain and fatigue and found to be in afib with RVR  History of Present Illness:   Logan Wright is a 74 year old male with past medical history of right breast cancer status post mastectomy, CKD stage III, hypertension, hyperlipidemia and prediabetes.  His father had history of CABG however lived until his 63s.  His mother passed away in her 41s.  Patient was remotely seen by Dr. Daneen Schick in September 2016 for evaluation of atypical chest pain. He was seen by Dr. Acie Fredrickson in February 2020 for follow-up of hypertension.  Most recently, he was seen by Daune Perch, NP on 08/02/2019 via virtual visit for preop clearance.  Subsequent Myoview obtained on 08/17/2019 showed EF 68%, normal perfusion.  Patient has been diagnosed with right breast cancer in late 2020.  Diagnostic mammography obtained in May 2021 showed 3.2 cm irregular mass.  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  He underwent a right mastectomy on 03/16/2020 by Dr. [name].  Afterward, she was started on adjuvant chemotherapy in July 2021.  For the past several weeks, he has been having increasing fatigue and shortness of breath with exertion.  For the past 2 days, he also started having substernal chest discomfort as well.  Patient eventually sought medical attention at [facility] on 07/22/2020 which noted he was in atrial fibrillation with RVR.  He was  subsequently placed on IV diltiazem and IV heparin.  CT of the chest showed no PE, no finding of metastatic disease, hepatic steatosis, coronary atherosclerosis.  High-sensitivity troponin was borderline elevated however remained flat.  Creatinine 1.55.  Cardiology has been consulted for atrial fibrillation with RVR.   Past Medical History:  Diagnosis Date  . Breast cancer, right ([place])   . CKD (chronic kidney disease), stage III   . Colon adenoma 2015  . Diverticulosis    DENIES   . DJD (degenerative joint disease)   . ED (erectile dysfunction)   . Elevated blood pressure 2007-2008  . Esophageal reflux   . GERD (gastroesophageal reflux disease)   . Gout   . Gynecomastia    LEFT  . Hearing loss   . HTN (hypertension)   . Hypercholesteremia   . Non-cardiac chest pain   . OA (osteoarthritis)    LEFT SHOULDER  . Onychomycosis of foot with other complication   . Pre-diabetes   . Seborrhea   . Severe frontal headaches    RESOLVED   . Vertigo    RESOLVED   . Wears glasses   . Wears hearing aid     Past Surgical History:  Procedure Laterality Date  . COLONSCOPY     . HIP ARTHROPLASTY Bilateral    AT [place]   . IR CV LINE INJECTION  05/14/2020  . IR IMAGING GUIDED PORT INSERTION  05/17/2020  . IR REMOVAL TUN ACCESS W/ PORT W/O FL MOD SED  05/17/2020  . KNEE ARTHROSCOPY     UNSURE WHICH KNEE   . MASTECTOMY  W/ SENTINEL NODE BIOPSY Right 03/16/2020   Procedure: RIGHT BREAST MASTECTOMY WITH SENTINEL LYMPH NODE BIOPSY;  Surgeon: [name], MD;  Location: [place];  Service: General;  Laterality: Right;  . PORTACATH PLACEMENT Left 05/08/2020   Procedure: INSERTION PORT-A-CATH WITH ULTRASOUND;  Surgeon: [name], MD;  Location: [place];  Service: General;  Laterality: Left;  LMA  . TOTAL KNEE ARTHROPLASTY Left 11/23/2018   Procedure: TOTAL KNEE ARTHROPLASTY;  Surgeon: Paralee Cancel, MD;  Location: [facility];  Service: Orthopedics;  Laterality: Left;  70 mins       Medications Prior to Admission: Prior to Admission medications   Medication Sig Start Date End Date Taking? Authorizing Provider  allopurinol (ZYLOPRIM) 100 MG tablet Take 200 mg by mouth daily.   Yes [provider]  atorvastatin (LIPITOR) 40 MG tablet Take 40 mg by mouth at bedtime.    Yes [provider]  GLUCOSAMINE-CHONDROITIN PO Take 2 tablets by mouth daily.   Yes [provider]  lidocaine-prilocaine (EMLA) cream Apply to affected area once Patient taking differently: Apply 1 application topically daily as needed (port access).  04/28/20  Yes Magrinat, [name], MD  LORazepam (ATIVAN) 0.5 MG tablet Take 1 tablet (0.5 mg total) by mouth at bedtime as needed (Nausea or vomiting). Patient taking differently: Take 0.5 mg by mouth at bedtime as needed for sleep (Nausea or vomiting).  04/28/20  Yes Magrinat, [name], MD  losartan-hydrochlorothiazide (HYZAAR) 50-12.5 MG tablet TAKE 1 TABLET BY MOUTH DAILY. Patient taking differently: Take 1 tablet by mouth daily.  01/10/16  Yes Belva Crome, MD  Multiple Vitamin (MULTIVITAMIN) tablet Take 1 tablet by mouth daily.    Yes [provider]  Omega-3 Fatty Acids (FISH OIL ULTRA) 1400 MG CAPS Take 1,400-2,800 mg by mouth See admin instructions. Take 1400 mg in the morning and 2800 mg in the evening   Yes [provider]  dexamethasone (DECADRON) 4 MG tablet Take 2 tablets (8 mg total) by mouth daily. Start the day after chemotherapy for 2 days. Take with food. 04/28/20   Magrinat, [name], MD  HYDROcodone-acetaminophen (NORCO/VICODIN) 5-325 MG tablet Take 1 tablet by mouth every 4 (four) hours as needed for moderate pain. Patient not taking: Reported on 07/22/2020 03/17/20   [name], MD     Allergies:   No Known Allergies  Social History:   Social History   Socioeconomic History  . Marital status: Married    Spouse name: Not on file  . Number of children: Not on file  . Years of  education: Not on file  . Highest education level: Not on file  Occupational History  . Not on file  Tobacco Use  . Smoking status: Never Smoker  . Smokeless tobacco: Never Used  Vaping Use  . Vaping Use: Never used  Substance and Sexual Activity  . Alcohol use: No    Alcohol/week: 0.0 standard drinks  . Drug use: No  . Sexual activity: Not on file  Other Topics Concern  . Not on file  Social History Narrative  . Not on file   Social Determinants of Health   Financial Resource Strain:   . Difficulty of Paying Living Expenses: Not on file  Food Insecurity:   . Worried About Charity fundraiser in the Last Year: Not on file  . Ran Out of Food in the Last Year: Not on file  Transportation Needs:   . Lack of Transportation (Medical): Not on  file  . Lack of Transportation (Non-Medical): Not on file  Physical Activity:   . Days of Exercise per Week: Not on file  . Minutes of Exercise per Session: Not on file  Stress:   . Feeling of Stress : Not on file  Social Connections:   . Frequency of Communication with Friends and Family: Not on file  . Frequency of Social Gatherings with Friends and Family: Not on file  . Attends Religious Services: Not on file  . Active Member of Clubs or Organizations: Not on file  . Attends Archivist Meetings: Not on file  . Marital Status: Not on file  Intimate Partner Violence:   . Fear of Current or Ex-Partner: Not on file  . Emotionally Abused: Not on file  . Physically Abused: Not on file  . Sexually Abused: Not on file    Family History:   The patient's family history includes Heart attack in his mother; Heart failure in his father; Stroke in his mother. There is no history of Hypertension.    ROS:  Please see the history of present illness.  All other ROS reviewed and negative.     Physical Exam/Data:   Vitals:   07/22/20 0715 07/22/20 0745 07/22/20 0815 07/22/20 0900  BP: (!) 108/57 108/61 (!) 99/53 97/72  Pulse: 80  (!) 119 86 87  Resp: (!) 28 (!) 28 20 12   Temp:      TempSrc:      SpO2: 91% 93% 92% 94%    Intake/Output Summary (Last 24 hours) at 07/22/2020 0943 Last data filed at 07/22/2020 0906 Gross per 24 hour  Intake 1020 ml  Output --  Net 1020 ml   Last 3 Weights 07/19/2020 06/28/2020 06/06/2020  Weight (lbs) 228 lb 224 lb 12 oz 222 lb 8 oz  Weight (kg) 103.42 kg 101.946 kg 100.925 kg     There is no height or weight on file to calculate BMI.  General:  Well nourished, well developed, in no acute distress HEENT: normal Lymph: no adenopathy Neck: no JVD Endocrine:  No thryomegaly Vascular: No carotid bruits; FA pulses 2+ bilaterally without bruits  Cardiac:  normal S1, S2; irregularly irregular; no murmur  Lungs:  clear to auscultation bilaterally, no wheezing, rhonchi or rales  Abd: soft, nontender, no hepatomegaly  Ext: no edema Musculoskeletal:  No deformities, BUE and BLE strength normal and equal Skin: warm and dry  Neuro:  CNs 2-12 intact, no focal abnormalities noted Psych:  Normal affect    EKG:  The ECG that was done and was personally reviewed and demonstrates atrial fibrillation with RVR  Relevant CV Studies:  Myoview 08/17/2019  The left ventricular ejection fraction is hyperdynamic (>65%).  Nuclear stress EF: 68%. No wall motion abnormalities.  There was no ST segment deviation noted during stress.  The study is normal.  This is a low risk study. No perfusion defects.   Laboratory Data:  High Sensitivity Troponin:   Recent Labs  Lab 07/22/20 0329 07/22/20 0551  TROPONINIHS 83* 82*      Chemistry Recent Labs  Lab 07/19/20 1155 07/22/20 0329  NA 140 139  K 3.9 3.7  CL 107 106  CO2 27 22  GLUCOSE 170* 122*  BUN 23 35*  CREATININE 1.47* 1.55*  CALCIUM 9.1 8.6*  GFRNONAA 46* 43*  ANIONGAP 6 11    Recent Labs  Lab 07/19/20 1155  PROT 6.5  ALBUMIN 3.3*  AST 39  ALT 46*  ALKPHOS 78  BILITOT 0.4   Hematology Recent Labs  Lab  07/19/20 1155 07/22/20 0329  WBC 4.8 9.3  RBC 5.24 4.60  HGB 14.2 12.8*  HCT 43.6 39.9  MCV 83.2 86.7  MCH 27.1 27.8  MCHC 32.6 32.1  RDW 14.7 14.8  PLT 293 237   BNP Recent Labs  Lab 07/22/20 0329  BNP 431.3*    DDimer No results for input(s): DDIMER in the last 168 hours.   Radiology/Studies:  CT ANGIO CHEST PE W OR WO CONTRAST  Result Date: 07/22/2020 CLINICAL DATA:  Chest pain. Atrial fibrillation. PE suspected. Right mastectomy for male breast cancer EXAM: CT ANGIOGRAPHY CHEST WITH CONTRAST TECHNIQUE: Multidetector CT imaging of the chest was performed using the standard protocol during bolus administration of intravenous contrast. Multiplanar CT image reconstructions and MIPs were obtained to evaluate the vascular anatomy. CONTRAST:  155mL OMNIPAQUE IOHEXOL 350 MG/ML SOLN COMPARISON:  Chest radiograph earlier today.  No prior CT. FINDINGS: Cardiovascular: The quality of this exam for evaluation of pulmonary embolism is good. The bolus is well timed. Mild limitations secondary to patient body habitus and left arm position, not raised above the head. No evidence of pulmonary embolism. Left-sided Port-A-Cath tip at mid right atrium. Aortic atherosclerosis. Tortuous thoracic aorta. Mild cardiomegaly. Proximal LAD and mid right coronary artery calcification. Mediastinum/Nodes: No supraclavicular adenopathy. No axillary adenopathy. No mediastinal or hilar adenopathy. Lungs/Pleura: No pleural fluid.  Clear lungs. Upper Abdomen: Hepatic steatosis. Normal imaged portions of the spleen, pancreas, adrenal glands. Proximal gastric underdistention. Right mastectomy Musculoskeletal: Probable left-sided gynecomastia. Degenerative changes of both shoulders. Moderate thoracic spondylosis. Review of the MIP images confirms the above findings. IMPRESSION: 1. No evidence of pulmonary embolism. Mild limitations secondary to patient body habitus and left arm position. 2. Hepatic steatosis. 3. Right  mastectomy, without findings of metastatic disease in the chest. 4. Aortic Atherosclerosis (ICD10-I70.0). Coronary artery atherosclerosis. Electronically Signed   By: Abigail Miyamoto M.D.   On: 07/22/2020 07:49   DG Chest Port 1 View  Result Date: 07/22/2020 CLINICAL DATA:  Chest pain.  Atrial fibrillation. EXAM: PORTABLE CHEST 1 VIEW COMPARISON:  05/08/2020 FINDINGS: Left Port-A-Cath has been replaced, terminating in the high right atrium. Patient rotated left. Cardiomegaly accentuated by AP portable technique. Atherosclerosis in the transverse aorta. No pleural effusion or pneumothorax. Pulmonary interstitial thickening is favored to be due to AP portable technique and somewhat low lung volumes. No overt congestive failure. No lobar consolidation. Numerous leads and wires project over the chest. IMPRESSION: Cardiomegaly without congestive failure. Aortic Atherosclerosis (ICD10-I70.0). Electronically Signed   By: Abigail Miyamoto M.D.   On: 07/22/2020 04:58     Assessment and Plan:   1. Atrial fibrillation with RVR: Unknown duration, patient has been having increasing fatigue and shortness of breath for the past several weeks.  Intermittent chest discomfort for the past 2 days.  Heart rate on arrival was in the 120-130s range.  Started on low-dose IV diltiazem.  - This patients CHA2DS2-VASc Score and unadjusted Ischemic Stroke Rate (% per year) is equal to 4.8 % stroke rate/year from a score of 4  Above score calculated as 1 point each if present [CHF, HTN, DM, Vascular=MI/PAD/Aortic Plaque, Age if 65-74, or Male] Above score calculated as 2 points each if present [Age > 75, or Stroke/TIA/TE]  - Consider start on Eliquis 5mg  BID.   2. Chest pain: Likely related to #1.  CTA revealed coronary atherosclerosis, however recent Myoview obtained in November 2020 was negative.  Will focus on  treating atrial fibrillation, if continue to have chest pain will consider additional ischemic work-up.  3. Right  breast cancer s/p mastectomy: Previous biopsy revealed invasive ductal carcinoma stage IIa.  Recently underwent chemotherapy.  4. CKD stage III  5. Hypertension: Hold home blood pressure medication Hyzaar, started on IV diltiazem for rate control.  6. Hyperlipidemia: On Lipitor at home  7. Prediabetes       HEAR Score (for undifferentiated chest pain):  HEAR Score: 4       CHA2DS2-VASc Score = 4  This indicates a 4.8% annual risk of stroke. The patient's score is based upon: CHF History: 0 HTN History: 1 Diabetes History: 0 Stroke History: 0 Vascular Disease History: 1 Age Score: 2 Gender Score: 0   For questions or updates, please contact [facility] Please consult [email or web address] for contact info under     Signed, Almyra Deforest, [place]  07/22/2020 9:43 AM    Personally seen and examined. Agree with APP Provider above with the following comments:  74 yo M with 4 weeks of fatigue, shortness of breath, and chest pressure. Found to have negative Myoview Breast Cancer with no AF agents Asx with rate control - Starting [facility] - will attempt rate control with AV nodal agents; keep NPO at midnight for TEE/DCCV - if unable to do procedure tomorrow will discuss outpatient DCCV and oral agents only - chest pain resolved, likely RVR related  Werner Lean, MD

## 2020-07-22 NOTE — H&P (Signed)
History and Physical:    Logan Wright   JYN:[id number] DOB: August 22, 1946 DOA: 07/22/2020  Referring MD/provider: Dr. Francia Greaves PCP: [name], MD   Patient coming from: Home  Chief Complaint: "Winded during my walks the last couple of days"  History of Present Illness:   Logan Wright is an 74 y.o. male with PMH significant for HTN, HL, CKD 3 and recent diagnosis of breast cancer status post R mastectomy 4 months ago was in his USO H till 4 days ago when he noted a mild discomfort in his left upper chest which she thought was just a muscle pull.  However on his usual daily walk, patient noted that he was having hard time catching his breath and got winded.  Over the last couple of days patient became increasingly tired and lethargic and over the last day he has noted a slight chest pressure in mid chest which has been continuous.  Although the shortness of breath is worsened with exertion, the chest pressure is not.  There is no associated diaphoresis, nausea or vomiting.  No radiation of the pain to back, arms, jaws or neck.  Denies any pleuritic chest pain.  No sharp chest pain.  ED Course:  The patient was noted to be afebrile however to have a rapid heart rate and respiratory rate.  EKG was notable for new onset A. fib with RVR.  He was treated with Cardizem 10 mg with slight drop in his blood pressure to 90s over 50s.  He was given NS bolus 500 cc x 2 with some improvement in blood pressure.  Patient underwent CTA which was negative for PE.  Patient now called in for admission for further treatment and evaluation.  ROS:   ROS   Review of Systems: General: Denies fever, chills, malaise,  GI: Denies nausea, vomiting, diarrhea or constipation GU: Denies dysuria, frequency or hematuria CNS: Denies HA, dizziness, confusion, new weakness or clumsiness. Blood/lymphatics: Denies easy bruising or bleeding Mood/affect: Denies anxiety/depression    Past Medical History:   Past  Medical History:  Diagnosis Date  . Breast cancer, right ([place])   . CKD (chronic kidney disease), stage III   . Colon adenoma 2015  . Diverticulosis    DENIES   . DJD (degenerative joint disease)   . ED (erectile dysfunction)   . Elevated blood pressure 2007-2008  . Esophageal reflux   . GERD (gastroesophageal reflux disease)   . Gout   . Gynecomastia    LEFT  . Hearing loss   . HTN (hypertension)   . Hypercholesteremia   . Non-cardiac chest pain   . OA (osteoarthritis)    LEFT SHOULDER  . Onychomycosis of foot with other complication   . Pre-diabetes   . Seborrhea   . Severe frontal headaches    RESOLVED   . Vertigo    RESOLVED   . Wears glasses   . Wears hearing aid     Past Surgical History:   Past Surgical History:  Procedure Laterality Date  . COLONSCOPY     . HIP ARTHROPLASTY Bilateral    AT [place]   . IR CV LINE INJECTION  05/14/2020  . IR IMAGING GUIDED PORT INSERTION  05/17/2020  . IR REMOVAL TUN ACCESS W/ PORT W/O FL MOD SED  05/17/2020  . KNEE ARTHROSCOPY     UNSURE WHICH KNEE   . MASTECTOMY W/ SENTINEL NODE BIOPSY Right 03/16/2020   Procedure: RIGHT BREAST MASTECTOMY WITH SENTINEL LYMPH  NODE BIOPSY;  Surgeon: [name], MD;  Location: [facility];  Service: General;  Laterality: Right;  . PORTACATH PLACEMENT Left 05/08/2020   Procedure: INSERTION PORT-A-CATH WITH ULTRASOUND;  Surgeon: [name], MD;  Location: [facility];  Service: General;  Laterality: Left;  LMA  . TOTAL KNEE ARTHROPLASTY Left 11/23/2018   Procedure: TOTAL KNEE ARTHROPLASTY;  Surgeon: Paralee Cancel, MD;  Location: [facility];  Service: Orthopedics;  Laterality: Left;  70 mins    Social History:   Social History   Socioeconomic History  . Marital status: Married    Spouse name: Not on file  . Number of children: Not on file  . Years of education: Not on file  . Highest education level: Not on file  Occupational History  . Not on file  Tobacco Use  . Smoking  status: Never Smoker  . Smokeless tobacco: Never Used  Vaping Use  . Vaping Use: Never used  Substance and Sexual Activity  . Alcohol use: No    Alcohol/week: 0.0 standard drinks  . Drug use: No  . Sexual activity: Not on file  Other Topics Concern  . Not on file  Social History Narrative  . Not on file   Social Determinants of Health   Financial Resource Strain:   . Difficulty of Paying Living Expenses: Not on file  Food Insecurity:   . Worried About Charity fundraiser in the Last Year: Not on file  . Ran Out of Food in the Last Year: Not on file  Transportation Needs:   . Lack of Transportation (Medical): Not on file  . Lack of Transportation (Non-Medical): Not on file  Physical Activity:   . Days of Exercise per Week: Not on file  . Minutes of Exercise per Session: Not on file  Stress:   . Feeling of Stress : Not on file  Social Connections:   . Frequency of Communication with Friends and Family: Not on file  . Frequency of Social Gatherings with Friends and Family: Not on file  . Attends Religious Services: Not on file  . Active Member of Clubs or Organizations: Not on file  . Attends Archivist Meetings: Not on file  . Marital Status: Not on file  Intimate Partner Violence:   . Fear of Current or Ex-Partner: Not on file  . Emotionally Abused: Not on file  . Physically Abused: Not on file  . Sexually Abused: Not on file    Allergies   Patient has no known allergies.  Family history:   Family History  Problem Relation Age of Onset  . Heart attack Mother   . Stroke Mother   . Heart failure Father        31  . Hypertension Neg Hx     Current Medications:   Prior to Admission medications   Medication Sig Start Date End Date Taking? Authorizing Provider  allopurinol (ZYLOPRIM) 100 MG tablet Take 200 mg by mouth daily.    [provider]  atorvastatin (LIPITOR) 40 MG tablet Take 40 mg by mouth daily.    [provider]    dexamethasone (DECADRON) 4 MG tablet Take 2 tablets (8 mg total) by mouth daily. Start the day after chemotherapy for 2 days. Take with food. 04/28/20   Magrinat, [name], MD  GLUCOSAMINE-CHONDROITIN PO Take 2 tablets by mouth daily.    [provider]  HYDROcodone-acetaminophen (NORCO/VICODIN) 5-325 MG tablet Take 1 tablet by mouth every 4 (four) hours  as needed for moderate pain. 03/17/20   [name], MD  lidocaine-prilocaine (EMLA) cream Apply to affected area once Patient taking differently: Apply 1 application topically daily as needed (port access).  04/28/20   Magrinat, [name], MD  LORazepam (ATIVAN) 0.5 MG tablet Take 1 tablet (0.5 mg total) by mouth at bedtime as needed (Nausea or vomiting). 04/28/20   Magrinat, [name], MD  losartan-hydrochlorothiazide (HYZAAR) 50-12.5 MG tablet TAKE 1 TABLET BY MOUTH DAILY. Patient taking differently: Take 1 tablet by mouth daily.  01/10/16   Belva Crome, MD  Multiple Vitamin (MULTIVITAMIN) tablet Take 1 tablet by mouth daily.     [provider]  Omega-3 Fatty Acids (FISH OIL ULTRA) 1400 MG CAPS Take 1,400-2,800 mg by mouth See admin instructions. Take 1400 mg in the morning and 2800 mg in the evening    [provider]  Potassium 99 MG TABS Take 99 mg by mouth daily.     [provider]    Physical Exam:   Vitals:   07/22/20 0615 07/22/20 0630 07/22/20 0715 07/22/20 0745  BP: 106/62 98/69 (!) 108/57 108/61  Pulse: (!) 55 84 80 (!) 119  Resp: (!) 32 (!) 31 (!) 28 (!) 28  Temp:      TempSrc:      SpO2: 95% 97% 91% 93%     Physical Exam: Blood pressure 108/61, pulse (!) 119, temperature 98.4 F (36.9 C), temperature source Oral, resp. rate (!) 28, SpO2 93 %. Gen: Friendly well-appearing man sitting up in bed in no acute distress speaking in full sentences without difficulty Eyes: sclera anicteric, conjuctiva mildly injected bilaterally CVS: S1-S2, irregular, no gallops Respiratory:  decreased  air entry likely secondary to decreased inspiratory effort GI: NABS, soft, NT  LE: No edema. No cyanosis Neuro: A/O x 3, Moving all extremities equally with normal strength, CN 3-12 intact, grossly nonfocal.  Psych: patient is logical and coherent, judgement and insight appear normal, mood and affect appropriate to situation. Skin: no rashes or lesions or ulcers,    Data Review:    Labs: Basic Metabolic Panel: Recent Labs  Lab 07/19/20 1155 07/22/20 0329  NA 140 139  K 3.9 3.7  CL 107 106  CO2 27 22  GLUCOSE 170* 122*  BUN 23 35*  CREATININE 1.47* 1.55*  CALCIUM 9.1 8.6*   Liver Function Tests: Recent Labs  Lab 07/19/20 1155  AST 39  ALT 46*  ALKPHOS 78  BILITOT 0.4  PROT 6.5  ALBUMIN 3.3*   No results for input(s): LIPASE, AMYLASE in the last 168 hours. No results for input(s): AMMONIA in the last 168 hours. CBC: Recent Labs  Lab 07/19/20 1155 07/22/20 0329  WBC 4.8 9.3  NEUTROABS 2.0  --   HGB 14.2 12.8*  HCT 43.6 39.9  MCV 83.2 86.7  PLT 293 237   Cardiac Enzymes: No results for input(s): CKTOTAL, CKMB, CKMBINDEX, TROPONINI in the last 168 hours.  BNP (last 3 results) No results for input(s): PROBNP in the last 8760 hours. CBG: No results for input(s): GLUCAP in the last 168 hours.  Urinalysis No results found for: COLORURINE, APPEARANCEUR, LABSPEC, PHURINE, GLUCOSEU, HGBUR, BILIRUBINUR, KETONESUR, PROTEINUR, UROBILINOGEN, NITRITE, LEUKOCYTESUR    Radiographic Studies: CT ANGIO CHEST PE W OR WO CONTRAST  Result Date: 07/22/2020 CLINICAL DATA:  Chest pain. Atrial fibrillation. PE suspected. Right mastectomy for male breast cancer EXAM: CT ANGIOGRAPHY CHEST WITH CONTRAST TECHNIQUE: Multidetector CT imaging of the chest was performed using the standard  protocol during bolus administration of intravenous contrast. Multiplanar CT image reconstructions and MIPs were obtained to evaluate the vascular anatomy. CONTRAST:  170mL OMNIPAQUE IOHEXOL 350 MG/ML  SOLN COMPARISON:  Chest radiograph earlier today.  No prior CT. FINDINGS: Cardiovascular: The quality of this exam for evaluation of pulmonary embolism is good. The bolus is well timed. Mild limitations secondary to patient body habitus and left arm position, not raised above the head. No evidence of pulmonary embolism. Left-sided Port-A-Cath tip at mid right atrium. Aortic atherosclerosis. Tortuous thoracic aorta. Mild cardiomegaly. Proximal LAD and mid right coronary artery calcification. Mediastinum/Nodes: No supraclavicular adenopathy. No axillary adenopathy. No mediastinal or hilar adenopathy. Lungs/Pleura: No pleural fluid.  Clear lungs. Upper Abdomen: Hepatic steatosis. Normal imaged portions of the spleen, pancreas, adrenal glands. Proximal gastric underdistention. Right mastectomy Musculoskeletal: Probable left-sided gynecomastia. Degenerative changes of both shoulders. Moderate thoracic spondylosis. Review of the MIP images confirms the above findings. IMPRESSION: 1. No evidence of pulmonary embolism. Mild limitations secondary to patient body habitus and left arm position. 2. Hepatic steatosis. 3. Right mastectomy, without findings of metastatic disease in the chest. 4. Aortic Atherosclerosis (ICD10-I70.0). Coronary artery atherosclerosis. Electronically Signed   By: Abigail Miyamoto M.D.   On: 07/22/2020 07:49   DG Chest Port 1 View  Result Date: 07/22/2020 CLINICAL DATA:  Chest pain.  Atrial fibrillation. EXAM: PORTABLE CHEST 1 VIEW COMPARISON:  05/08/2020 FINDINGS: Left Port-A-Cath has been replaced, terminating in the high right atrium. Patient rotated left. Cardiomegaly accentuated by AP portable technique. Atherosclerosis in the transverse aorta. No pleural effusion or pneumothorax. Pulmonary interstitial thickening is favored to be due to AP portable technique and somewhat low lung volumes. No overt congestive failure. No lobar consolidation. Numerous leads and wires project over the chest.  IMPRESSION: Cardiomegaly without congestive failure. Aortic Atherosclerosis (ICD10-I70.0). Electronically Signed   By: Abigail Miyamoto M.D.   On: 07/22/2020 04:58    EKG: Independently reviewed.  Atrial fibrillation at 136.  Normal axis.  Patient has an unusual ST wave morphology into, 35F V4 and V5 which might be half a millimeter ST elevation although ST segment remains concave.  Some of the QRS morphologies are also variable.   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  New onset atrial fibrillation  CTA negative for PE. Presently on a Cardizem drip with improved but not optimized rate control H&H is stable on repeat, will start Eliquis 5 twice daily even chads vas 2 score of 4 Echocardiogram ordered Very much appreciate cardiology consultation  Chest Discomfort with abnormal EKG Negative myoview 6 mos ago EKG has odd ST segments morphology an unusual distribution Appreciate cardiology input Troponins seem to have peaked at 83, likely demand ischemia Further work-up either as an inpatient or outpatient as warranted per cardiology recommendations  Slight decrease in H&H from 3 days ago H&H stable on repeat We will send iron studies Start Eliquis as noted above  HTN Blood pressures were low earlier although improved with IV fluid resuscitation Hold antihypertensives Patient may well benefit from beta-blocker when ready to come off of Cardizem drip  CKD 3 Should improve with hydration, creatinine recheck in  the morning  Elevated BNP Patient had few rales at right base on exam Oxygenating  well Echocardiogram pending   Other information:   DVT prophylaxis: Eliquis Code Status: Full Family Communication: States he is in contact with his wife no need to call Disposition Plan: Home Consults called: Cardiology Admission status: Inpatient  [facility]  If 7PM-7AM, please contact night-coverage [email or web address] Password [email or web address] 07/22/2020, 8:29 AM

## 2020-07-22 NOTE — ED Notes (Signed)
B/P systolic in upper 95'D to low 90's. MD Bero made aware. Additional 500 cc bolus ordered.

## 2020-07-23 ENCOUNTER — Inpatient Hospital Stay (HOSPITAL_BASED_OUTPATIENT_CLINIC_OR_DEPARTMENT_OTHER): Payer: PPO

## 2020-07-23 ENCOUNTER — Encounter (HOSPITAL_COMMUNITY): Payer: Self-pay | Admitting: Internal Medicine

## 2020-07-23 ENCOUNTER — Other Ambulatory Visit: Payer: Self-pay

## 2020-07-23 ENCOUNTER — Other Ambulatory Visit: Payer: Self-pay | Admitting: Oncology

## 2020-07-23 ENCOUNTER — Telehealth: Payer: Self-pay | Admitting: Oncology

## 2020-07-23 ENCOUNTER — Encounter: Payer: Self-pay | Admitting: *Deleted

## 2020-07-23 DIAGNOSIS — I4891 Unspecified atrial fibrillation: Secondary | ICD-10-CM | POA: Diagnosis not present

## 2020-07-23 DIAGNOSIS — R0789 Other chest pain: Secondary | ICD-10-CM | POA: Diagnosis not present

## 2020-07-23 DIAGNOSIS — Z79899 Other long term (current) drug therapy: Secondary | ICD-10-CM | POA: Diagnosis not present

## 2020-07-23 DIAGNOSIS — Z96652 Presence of left artificial knee joint: Secondary | ICD-10-CM | POA: Diagnosis not present

## 2020-07-23 DIAGNOSIS — I129 Hypertensive chronic kidney disease with stage 1 through stage 4 chronic kidney disease, or unspecified chronic kidney disease: Secondary | ICD-10-CM | POA: Diagnosis not present

## 2020-07-23 DIAGNOSIS — Z853 Personal history of malignant neoplasm of breast: Secondary | ICD-10-CM | POA: Diagnosis not present

## 2020-07-23 DIAGNOSIS — R0600 Dyspnea, unspecified: Secondary | ICD-10-CM | POA: Diagnosis not present

## 2020-07-23 DIAGNOSIS — I10 Essential (primary) hypertension: Secondary | ICD-10-CM | POA: Diagnosis not present

## 2020-07-23 DIAGNOSIS — Z20822 Contact with and (suspected) exposure to covid-19: Secondary | ICD-10-CM | POA: Diagnosis not present

## 2020-07-23 DIAGNOSIS — N183 Chronic kidney disease, stage 3 unspecified: Secondary | ICD-10-CM | POA: Diagnosis not present

## 2020-07-23 DIAGNOSIS — R7303 Prediabetes: Secondary | ICD-10-CM | POA: Diagnosis not present

## 2020-07-23 LAB — ECHOCARDIOGRAM COMPLETE
Height: 67 in
S' Lateral: 3.2 cm
Weight: 3644.8 oz

## 2020-07-23 LAB — BASIC METABOLIC PANEL
Anion gap: 6 (ref 5–15)
BUN: 27 mg/dL — ABNORMAL HIGH (ref 8–23)
CO2: 26 mmol/L (ref 22–32)
Calcium: 8.2 mg/dL — ABNORMAL LOW (ref 8.9–10.3)
Chloride: 106 mmol/L (ref 98–111)
Creatinine, Ser: 1.42 mg/dL — ABNORMAL HIGH (ref 0.61–1.24)
GFR, Estimated: 48 mL/min — ABNORMAL LOW (ref >60)
Glucose, Bld: 123 mg/dL — ABNORMAL HIGH (ref 70–99)
Potassium: 3.7 mmol/L (ref 3.5–5.1)
Sodium: 138 mmol/L (ref 135–145)

## 2020-07-23 LAB — CBC
HCT: 36.4 % — ABNORMAL LOW (ref 39.0–52.0)
Hemoglobin: 11.7 g/dL — ABNORMAL LOW (ref 13.0–17.0)
MCH: 27.3 pg (ref 26.0–34.0)
MCHC: 32.1 g/dL (ref 30.0–36.0)
MCV: 85 fL (ref 80.0–100.0)
Platelets: 180 10*3/uL (ref 150–400)
RBC: 4.28 MIL/uL (ref 4.22–5.81)
RDW: 14.6 % (ref 11.5–15.5)
WBC: 5.5 10*3/uL (ref 4.0–10.5)
nRBC: 0 % (ref 0.0–0.2)

## 2020-07-23 LAB — PROTIME-INR
INR: 1.3 — ABNORMAL HIGH (ref 0.8–1.2)
Prothrombin Time: 15.3 seconds — ABNORMAL HIGH (ref 11.4–15.2)

## 2020-07-23 LAB — TROPONIN I (HIGH SENSITIVITY): Troponin I (High Sensitivity): 67 ng/L — ABNORMAL HIGH (ref <18)

## 2020-07-23 MED ORDER — DILTIAZEM HCL 60 MG PO TABS
60.0000 mg | ORAL_TABLET | Freq: Four times a day (QID) | ORAL | Status: DC
  Administered 2020-07-23: 60 mg via ORAL
  Filled 2020-07-23: qty 1

## 2020-07-23 MED ORDER — HEPARIN SOD (PORK) LOCK FLUSH 100 UNIT/ML IV SOLN
500.0000 [IU] | INTRAVENOUS | Status: AC | PRN
  Administered 2020-07-23: 500 [IU]
  Filled 2020-07-23: qty 5

## 2020-07-23 MED ORDER — APIXABAN 5 MG PO TABS
5.0000 mg | ORAL_TABLET | Freq: Two times a day (BID) | ORAL | 3 refills | Status: DC
Start: 2020-07-23 — End: 2020-07-23

## 2020-07-23 MED ORDER — LOSARTAN POTASSIUM-HCTZ 50-12.5 MG PO TABS: 1.0000 | ORAL_TABLET | Freq: Every day | ORAL | 5 refills | Status: DC

## 2020-07-23 MED ORDER — DILTIAZEM HCL ER COATED BEADS 240 MG PO CP24
240.0000 mg | ORAL_CAPSULE | Freq: Every day | ORAL | 1 refills | Status: DC
Start: 2020-07-24 — End: 2020-07-23

## 2020-07-23 MED ORDER — APIXABAN 5 MG PO TABS: 5.0000 mg | ORAL_TABLET | Freq: Two times a day (BID) | ORAL | 3 refills | Status: DC

## 2020-07-23 MED ORDER — DILTIAZEM HCL ER COATED BEADS 240 MG PO CP24
240.0000 mg | ORAL_CAPSULE | Freq: Every day | ORAL | Status: DC
  Administered 2020-07-23: 240 mg via ORAL
  Filled 2020-07-23: qty 1

## 2020-07-23 MED ORDER — DILTIAZEM HCL ER COATED BEADS 240 MG PO CP24
240.0000 mg | ORAL_CAPSULE | Freq: Every day | ORAL | 1 refills | Status: DC
Start: 2020-07-24 — End: 2020-08-13

## 2020-07-23 NOTE — TOC Benefit Eligibility Note (Signed)
Transition of Care [facility]) Benefit Eligibility Note    Patient Details  Name: Logan Wright MRN: [id number] Date of Birth: [birth year]   Medication/Dose: Eliquis 2.5 mg. and or 5mg . 30 day supply  Covered?: Yes  Tier: 3 Drug  Prescription Coverage Preferred Pharmacy: [facility]  Spoke with Person/Company/Phone Number:: Larey Dresser W/Customer Care W/[employer] PH# [phone]  Co-Pay: $45.00  Prior Approval: No  Deductible:  (No Deductible)       Shelda Altes Phone Number: 07/23/2020, 10:32 AM

## 2020-07-23 NOTE — Care Management Obs Status (Signed)
MEDICARE OBSERVATION STATUS NOTIFICATION   Patient Details  Name: Logan Wright MRN: [id number] Date of Birth: 1946/08/18   Medicare Observation Status Notification Given:  Yes    Laurena Slimmer, RN 07/23/2020, 6:23 PM

## 2020-07-23 NOTE — Telephone Encounter (Signed)
Scheduled per 10/7 los. Called and spoke with pt, pt was transferred to nurse for questions

## 2020-07-23 NOTE — Progress Notes (Signed)
   07/22/20 2130  Assess: MEWS Score  Temp 100.3 F (37.9 C)  BP 94/60  Pulse Rate (!) 105  Resp 18  SpO2 94 %  O2 Device Room Air  Assess: MEWS Score  MEWS Temp 0  MEWS Systolic 1  MEWS Pulse 1  MEWS RR 0  MEWS LOC 0  MEWS Score 2  MEWS Score Color Yellow  Assess: if the MEWS score is Yellow or Red  Were vital signs taken at a resting state? Yes  Focused Assessment No change from prior assessment  Early Detection of Sepsis Score *See Row Information* Low  MEWS guidelines implemented *See Row Information* No, previously yellow, continue vital signs every 4 hours  Document  Patient Outcome Stabilized after interventions  Progress note created (see row info) Yes

## 2020-07-23 NOTE — Care Management CC44 (Signed)
Condition Code 44 Documentation Completed  Patient Details  Name: Logan Wright MRN: [id number] Date of Birth: Oct 26, 1945   Condition Code 44 given:    Patient signature on Condition Code 44 notice:    Documentation of 2 MD's agreement:    Code 44 added to claim:       Laurena Slimmer, RN 07/23/2020, 6:23 PM

## 2020-07-23 NOTE — Progress Notes (Signed)
This note also relates to the following rows which could not be included: ECG Heart Rate - Cannot attach notes to unvalidated device data    07/22/20 2000  Assess: MEWS Score  Level of Consciousness Alert  Assess: MEWS Score  MEWS Temp 0  MEWS Systolic 0  MEWS Pulse 2  MEWS RR 0  MEWS LOC 0  MEWS Score 2  MEWS Score Color Yellow  Assess: if the MEWS score is Yellow or Red  Were vital signs taken at a resting state? Yes  Focused Assessment No change from prior assessment  Early Detection of Sepsis Score *See Row Information* Low  MEWS guidelines implemented *See Row Information* No, previously yellow, continue vital signs every 4 hours  Document  Patient Outcome Stabilized after interventions  Progress note created (see row info) Yes

## 2020-07-23 NOTE — Progress Notes (Signed)
[facility] PROGRESS NOTE    Progress Note  Logan Wright  OBS:[id number] DOB: 12-Jan-1946 DOA: 07/22/2020 PCP: [name], MD     Brief Narrative:   Logan Wright is an 74 y.o. 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  She relates that about 4 days prior to admission started having some left upper chest discomfort.  He also noticed some dyspnea on exertion tired and lethargic.  In the ED he was noted to be in A. fib with RVR by EKG he was started on diltiazem drip CT angio of the chest was negative for PE.  Assessment/Plan:   Atrial fibrillation, new onset [facility]): CT angio of the chest negative. Chads Vascor of 4. 2D echo is pending currently on diltiazem IV drip will transition to oral. He was started on oral Eliquis. For DCCV hopefully on 07/23/2020.  Chest pain Likely related to #1 CTA revealed coronary atherosclerosis however recent Myoview in November 2020 was negative.  Right breast neoplasm in a male: Status postmastectomy 4 months ago. Recently underwent chemotherapy will need to follow-up with oncology as an outpatient.  Chronic kidney disease stage III: Creatinine appears to be at baseline.  Essential hypertension Holding Hyzaar and started diltiazem with rate control.  Hyperlipidemia Continue statins.    DVT prophylaxis: lovenox Family Communication:none Status is: Inpatient  Remains inpatient appropriate because:Hemodynamically unstable   Dispo: The patient is from: Home              Anticipated d/c is to: Home              Anticipated d/c date is: 2 days              Patient currently is not medically stable to d/c.        Code Status:     Code Status Orders  (From admission, onward)         Start     Ordered     07/22/20 0956  Full code  Continuous        07/22/20 0956        Code Status History    Date Active Date Inactive Code Status Order ID Comments User Context   03/16/2020 1715 03/17/2020 1245 Full Code [id number]  [name], MD Inpatient   11/23/2018 1032 11/24/2018 1805 Full Code [id number]  Norman Herrlich Inpatient   Advance Care Planning Activity        IV Access:    Peripheral IV   Procedures and diagnostic studies:   CT ANGIO CHEST PE W OR WO CONTRAST  Result Date: 07/22/2020 CLINICAL DATA:  Chest pain. Atrial fibrillation. PE suspected. Right mastectomy for male breast cancer EXAM: CT ANGIOGRAPHY CHEST WITH CONTRAST TECHNIQUE: Multidetector CT imaging of the chest was performed using the standard protocol during bolus administration of intravenous contrast. Multiplanar CT image reconstructions and MIPs were obtained to evaluate the vascular anatomy. CONTRAST:  153mL OMNIPAQUE IOHEXOL 350 MG/ML SOLN COMPARISON:  Chest radiograph earlier today.  No prior CT. FINDINGS: Cardiovascular: The quality of this exam for evaluation of pulmonary embolism is good. The bolus is well timed. Mild limitations secondary to patient body habitus and left arm position, not raised above the head. No evidence of pulmonary embolism. Left-sided Port-A-Cath tip at mid right atrium. Aortic atherosclerosis. Tortuous thoracic aorta.  Mild cardiomegaly. Proximal LAD and mid right coronary artery calcification. Mediastinum/Nodes: No supraclavicular adenopathy. No axillary adenopathy. No mediastinal or hilar adenopathy. Lungs/Pleura: No pleural fluid.  Clear lungs. Upper Abdomen: Hepatic steatosis. Normal imaged portions of the spleen, pancreas, adrenal glands. Proximal gastric underdistention. Right mastectomy Musculoskeletal: Probable left-sided gynecomastia. Degenerative changes of both shoulders. Moderate thoracic spondylosis. Review of the MIP images confirms the above findings. IMPRESSION: 1. No  evidence of pulmonary embolism. Mild limitations secondary to patient body habitus and left arm position. 2. Hepatic steatosis. 3. Right mastectomy, without findings of metastatic disease in the chest. 4. Aortic Atherosclerosis (ICD10-I70.0). Coronary artery atherosclerosis. Electronically Signed   By: Abigail Miyamoto M.D.   On: 07/22/2020 07:49   DG Chest Port 1 View  Result Date: 07/22/2020 CLINICAL DATA:  Chest pain.  Atrial fibrillation. EXAM: PORTABLE CHEST 1 VIEW COMPARISON:  05/08/2020 FINDINGS: Left Port-A-Cath has been replaced, terminating in the high right atrium. Patient rotated left. Cardiomegaly accentuated by AP portable technique. Atherosclerosis in the transverse aorta. No pleural effusion or pneumothorax. Pulmonary interstitial thickening is favored to be due to AP portable technique and somewhat low lung volumes. No overt congestive failure. No lobar consolidation. Numerous leads and wires project over the chest. IMPRESSION: Cardiomegaly without congestive failure. Aortic Atherosclerosis (ICD10-I70.0). Electronically Signed   By: Abigail Miyamoto M.D.   On: 07/22/2020 04:58     Medical Consultants:    None.  Anti-Infectives:   none  Subjective:    Logan Wright relates he does not feel short of breath or chest discomfort today  Objective:    Vitals:   07/23/20 0008 07/23/20 0111 07/23/20 0520 07/23/20 0523  BP: 91/65 (!) 94/58 90/66 92/62   Pulse: 89 86 86   Resp: 18  18   Temp: 98.4 F (36.9 C)  98.4 F (36.9 C)   TempSrc: Oral  Oral   SpO2: 92%  92%   Weight:    103.3 kg  Height:       SpO2: 92 % O2 Flow Rate (L/min): 97 L/min   Intake/Output Summary (Last 24 hours) at 07/23/2020 0738 Last data filed at 07/23/2020 0700 Gross per 24 hour  Intake 1369.69 ml  Output 625 ml  Net 744.69 ml   Filed Weights   07/22/20 1817 07/23/20 0523  Weight: 104.8 kg 103.3 kg    Exam: General exam: In no acute distress. Respiratory system: Good air movement and  clear to auscultation. Cardiovascular system: S1 & S2 heard, RRR. No JVD. Gastrointestinal system: Abdomen is nondistended, soft and nontender.  Extremities: No pedal edema. Skin: No rashes, lesions or ulcers Psychiatry: Judgement and insight appear normal. Mood & affect appropriate.    Data Reviewed:    Labs: Basic Metabolic Panel: Recent Labs  Lab 07/19/20 1155 07/19/20 1155 07/22/20 0329 07/23/20 0508  NA 140  --  139 138  K 3.9   < > 3.7 3.7  CL 107  --  106 106  CO2 27  --  22 26  GLUCOSE 170*  --  122* 123*  BUN 23  --  35* 27*  CREATININE 1.47*  --  1.55* 1.42*  CALCIUM 9.1  --  8.6* 8.2*   < > = values in this interval not displayed.   GFR Estimated Creatinine Clearance: 51.5 mL/min (A) (by C-G formula based on SCr of 1.42 mg/dL (H)). Liver Function Tests: Recent Labs  Lab 07/19/20 1155  AST 39  ALT 46*  ALKPHOS 78  BILITOT 0.4  PROT  6.5  ALBUMIN 3.3*   No results for input(s): LIPASE, AMYLASE in the last 168 hours. No results for input(s): AMMONIA in the last 168 hours. Coagulation profile Recent Labs  Lab 07/23/20 0508  INR 1.3*   COVID-19 Labs  No results for input(s): DDIMER, FERRITIN, LDH, CRP in the last 72 hours.  Lab Results  Component Value Date   SARSCOV2NAA NEGATIVE 07/22/2020   [place] NEGATIVE 05/04/2020   [place] NEGATIVE 03/13/2020    CBC: Recent Labs  Lab 07/19/20 1155 07/22/20 0329 07/22/20 1049 07/23/20 0508  WBC 4.8 9.3  --  5.5  NEUTROABS 2.0  --   --   --   HGB 14.2 12.8* 12.2* 11.7*  HCT 43.6 39.9 39.1 36.4*  MCV 83.2 86.7  --  85.0  PLT 293 237  --  180   Cardiac Enzymes: No results for input(s): CKTOTAL, CKMB, CKMBINDEX, TROPONINI in the last 168 hours. BNP (last 3 results) No results for input(s): PROBNP in the last 8760 hours. CBG: No results for input(s): GLUCAP in the last 168 hours. D-Dimer: No results for input(s): DDIMER in the last 72 hours. Hgb A1c: No results for input(s): HGBA1C in  the last 72 hours. Lipid Profile: No results for input(s): CHOL, HDL, LDLCALC, TRIG, CHOLHDL, LDLDIRECT in the last 72 hours. Thyroid function studies: No results for input(s): TSH, T4TOTAL, T3FREE, THYROIDAB in the last 72 hours.  Invalid input(s): FREET3 Anemia work up: No results for input(s): VITAMINB12, FOLATE, FERRITIN, TIBC, IRON, RETICCTPCT in the last 72 hours. Sepsis Labs: Recent Labs  Lab 07/19/20 1155 07/22/20 0329 07/23/20 0508  WBC 4.8 9.3 5.5   Microbiology Recent Results (from the past 240 hour(s))  Respiratory Panel by RT PCR (Flu A&B, Covid) - Nasopharyngeal Swab     Status: None   Collection Time: 07/22/20  7:34 AM   Specimen: Nasopharyngeal Swab  Result Value Ref Range Status   SARS Coronavirus 2 by RT PCR NEGATIVE NEGATIVE Final    Comment: (NOTE) SARS-CoV-2 target nucleic acids are NOT DETECTED.  The SARS-CoV-2 RNA is generally detectable in upper respiratoy specimens during the acute phase of infection. The lowest concentration of SARS-CoV-2 viral copies this assay can detect is 131 copies/mL. A negative result does not preclude SARS-Cov-2 infection and should not be used as the sole basis for treatment or other patient management decisions. A negative result may occur with  improper specimen collection/handling, submission of specimen other than nasopharyngeal swab, presence of viral mutation(s) within the areas targeted by this assay, and inadequate number of viral copies (<131 copies/mL). A negative result must be combined with clinical observations, patient history, and epidemiological information. The expected result is Negative.  Fact Sheet for Patients:  PinkCheek.be  Fact Sheet for Healthcare Providers:  GravelBags.it  This test is no t yet approved or cleared by the [place] FDA and  has been authorized for detection and/or diagnosis of SARS-CoV-2 by FDA under an Emergency Use  Authorization (EUA). This EUA will remain  in effect (meaning this test can be used) for the duration of the COVID-19 declaration under Section 564(b)(1) of the Act, 21 U.S.C. section 360bbb-3(b)(1), unless the authorization is terminated or revoked sooner.     Influenza A by PCR NEGATIVE NEGATIVE Final   Influenza B by PCR NEGATIVE NEGATIVE Final    Comment: (NOTE) The Xpert Xpress SARS-CoV-2/FLU/RSV assay is intended as an aid in  the diagnosis of influenza from Nasopharyngeal swab specimens and  should not be used as  a sole basis for treatment. Nasal washings and  aspirates are unacceptable for Xpert Xpress SARS-CoV-2/FLU/RSV  testing.  Fact Sheet for Patients: PinkCheek.be  Fact Sheet for Healthcare Providers: GravelBags.it  This test is not yet approved or cleared by the [place] FDA and  has been authorized for detection and/or diagnosis of SARS-CoV-2 by  FDA under an Emergency Use Authorization (EUA). This EUA will remain  in effect (meaning this test can be used) for the duration of the  Covid-19 declaration under Section 564(b)(1) of the Act, 21  U.S.C. section 360bbb-3(b)(1), unless the authorization is  terminated or revoked. Performed at [facility], [place] [address], [place], [place] [address]      Medications:   . allopurinol  200 mg Oral Daily  . apixaban  5 mg Oral BID  . aspirin EC  81 mg Oral Daily  . atorvastatin  40 mg Oral q1800  . Chlorhexidine Gluconate Cloth  6 each Topical Daily  . sodium chloride flush  10-40 mL Intracatheter Q12H  . sodium chloride flush  3 mL Intravenous Q12H   Continuous Infusions: . sodium chloride    . sodium chloride 20 mL/hr at 07/23/20 0018  . diltiazem (CARDIZEM) infusion 7.5 mg/hr (07/23/20 0110)      LOS: 1 day   Charlynne Cousins  [facility]  07/23/2020, 7:38 AM

## 2020-07-23 NOTE — Progress Notes (Signed)
   07/23/20 0008  Assess: MEWS Score  Temp 98.4 F (36.9 C)  BP 91/65  Pulse Rate 89  Resp 18  SpO2 92 %  O2 Device Room Air  Assess: MEWS Score  MEWS Temp 0  MEWS Systolic 1  MEWS Pulse 0  MEWS RR 0  MEWS LOC 0  MEWS Score 1  MEWS Score Color Green  Assess: if the MEWS score is Yellow or Red  Were vital signs taken at a resting state? Yes  Focused Assessment Change from prior assessment (see assessment flowsheet)  Early Detection of Sepsis Score *See Row Information* Low  MEWS guidelines implemented *See Row Information* No, other (Comment) (HR more controlled )  Document  Patient Outcome Stabilized after interventions  Progress note created (see row info) Yes

## 2020-07-23 NOTE — Progress Notes (Signed)
Echocardiogram 2D Echocardiogram has been performed.  Logan Wright 07/23/2020, 8:45 AM

## 2020-07-23 NOTE — Progress Notes (Signed)
Pt requesting to speak with cardiologist has a few more questions. Cards PA made aware.

## 2020-07-23 NOTE — Progress Notes (Addendum)
Cardiology Admission History and Physical:   Patient ID: Logan Wright MRN: [id number]; DOB: 1946/08/15   Admission date: 07/22/2020  Primary Care Provider: [name], MD [facility] Cardiologist: Mertie Moores, MD    Patient Profile:   Logan Wright is a 74 y.o. male with past medical history of right breast cancer status post mastectomy, CKD stage III, HTN, HLD and prediabetes presented with with a fib RVR; symptoms have improved with rate control but still with generalized fatigue  Subjective   Logan Wright is feeling well today.  No palpitations.  No chest pain, no shortness of breath.  Getting TTE; patient is hungry and hopeful to get TEE/DCCV.   Physical Exam/Data:   Vitals:   07/23/20 0008 07/23/20 0111 07/23/20 0520 07/23/20 0523  BP: 91/65 (!) 94/58 90/66 92/62   Pulse: 89 86 86   Resp: 18  18   Temp: 98.4 F (36.9 C)  98.4 F (36.9 C)   TempSrc: Oral  Oral   SpO2: 92%  92%   Weight:    103.3 kg  Height:        Intake/Output Summary (Last 24 hours) at 07/23/2020 0830 Last data filed at 07/23/2020 0751 Gross per 24 hour  Intake 1369.69 ml  Output 625 ml  Net 744.69 ml   Last 3 Weights 07/23/2020 07/22/2020 07/19/2020  Weight (lbs) 227 lb 12.8 oz 231 lb 1.6 oz 228 lb  Weight (kg) 103.329 kg 104.826 kg 103.42 kg     Body mass index is 35.68 kg/m.  General:  Obese Male, well developed, in no acute distress Neck: no JVD 90 degrees Cardiac:  normal S1, S2; irregularly irregular; no murmur  Lungs:  clear to auscultation bilaterally, no wheezing, rhonchi or rales  Abd: soft, nontender, no hepatomegaly  Ext: no edema Musculoskeletal:  No deformities, BUE and BLE strength normal and equal Skin: warm and dry  Neuro:  CNs 2-12 intact, no focal abnormalities noted Psych:  Normal affect   Tele:  Atrial fibrillation without RVR- Personallly reviewed  Relevant CV Studies: Echo Pending   Laboratory Data:  High Sensitivity Troponin:   Recent Labs  Lab  07/22/20 0329 07/22/20 0551 07/22/20 1049 07/22/20 1713 07/22/20 2332  TROPONINIHS 83* 82* 68* 65* 67*      Chemistry Recent Labs  Lab 07/22/20 0329 07/23/20 0508  NA 139 138  K 3.7 3.7  CL 106 106  CO2 22 26  GLUCOSE 122* 123*  BUN 35* 27*  CREATININE 1.55* 1.42*  CALCIUM 8.6* 8.2*  GFRNONAA 43* 48*  ANIONGAP 11 6    Recent Labs  Lab 07/19/20 1155  PROT 6.5  ALBUMIN 3.3*  AST 39  ALT 46*  ALKPHOS 78  BILITOT 0.4   Hematology Recent Labs  Lab 07/22/20 0329 07/22/20 0329 07/22/20 1049 07/23/20 0508  WBC 9.3  --   --  5.5  RBC 4.60  --   --  4.28  HGB 12.8*   < > 12.2* 11.7*  HCT 39.9   < > 39.1 36.4*  MCV 86.7  --   --  85.0  MCH 27.8  --   --  27.3  MCHC 32.1  --   --  32.1  RDW 14.8  --   --  14.6  PLT 237  --   --  180   < > = values in this interval not displayed.   BNP Recent Labs  Lab 07/22/20 0329  BNP 431.3*    Radiology/Studies:  No  results found.  Assessment and Plan:   Paroxysmal Atrial Fibrillation- symptomatic, CHADSVASC 4 with new Eliquis start 07/22/20; CKD III, on 5 FU -  Echo Pending - SHARED DECISION MAKING:  Discussed risks, benefits, and quality of life aspects of outpatient DCCV with AC and oral meds vs DCCV tomorrow.  Patient would like to trial more conservative therapy; will attempt PO transition to 240 mg diltiazem at late discharge if stable and rate controlled -  If failed, will attempt to get TEE/DCCV 10/12; NPO at midnight  Chest pain with demand ischemia from RVR - stable troponin, negative myoview in the past, CP has resolved; therapy as above Hypertension - currently lwo BP on diltiazem HLD - continue atorvastatin  Breast Cancer ER PR Positive - cyclophosphamide, methotrexate and fluorouracil (CMF) chemotherapy; at higher risk for silent ischemia; no change in monitoring - echo pending - will get echo in 6 weeks as outpatient through course  For questions or updates, please contact [facility] Please  consult [email or web address] for contact info under     Signed, Werner Lean, MD  07/23/2020 8:30 AM   Addendum:  Mild enlargement of ascending aorta noted on echo, confirmed on CT PE from prior.  Will plan for outpatient monitoring as appropriate.   Echo Report:  IMPRESSIONS    1. Left ventricular ejection fraction, by estimation, is 55 to 60%. The  left ventricle has normal function. The left ventricle has no regional  wall motion abnormalities. There is mild concentric left ventricular  hypertrophy. Left ventricular diastolic  function could not be evaluated.  2. Right ventricular systolic function is normal. The right ventricular  size is normal.  3. Left atrial size was moderately dilated.  4. The mitral valve is normal in structure. Trivial mitral valve  regurgitation. No evidence of mitral stenosis.  5. The aortic valve is grossly normal. There is mild calcification of the  aortic valve. There is mild thickening of the aortic valve. Aortic valve  regurgitation is not visualized. Mild aortic valve sclerosis is present,  with no evidence of aortic valve  stenosis.  6. Aortic dilatation noted. There is mild dilatation of the ascending  aorta, measuring 42 mm.   Comparison(s): No prior Echocardiogram.   Conclusion(s)/Recommendation(s): Otherwise normal echocardiogram, with  minor abnormalities described in the report.

## 2020-07-24 ENCOUNTER — Encounter (HOSPITAL_COMMUNITY): Payer: Self-pay | Admitting: Certified Registered Nurse Anesthetist

## 2020-07-24 ENCOUNTER — Encounter (HOSPITAL_COMMUNITY): Payer: Self-pay | Admitting: Cardiology

## 2020-07-24 ENCOUNTER — Encounter (HOSPITAL_COMMUNITY): Payer: PPO

## 2020-07-24 ENCOUNTER — Ambulatory Visit (HOSPITAL_COMMUNITY): Admit: 2020-07-24 | Payer: PPO | Admitting: Cardiology

## 2020-07-24 ENCOUNTER — Telehealth: Payer: Self-pay | Admitting: Cardiovascular Disease

## 2020-07-24 SURGERY — CARDIOVERSION
Anesthesia: General

## 2020-07-24 NOTE — Telephone Encounter (Signed)
Spoke with pt and he was discharged on 10/11 from [facility].  States TEE/DCCV was scheduled in the hospital but he believes that someone forgot to cancel it.  He was unaware that he was suppose to go today.  Pt currently scheduled to see Melina Copa, PA-C on 10/28 for post hospital f/u and to schedule DCCV once he has been on Eliquis long enough.  Pt mentioned that he told Dr. Gasper Sells, while in the hospital, that he felt great.  States he noticed when he tries to walk a block or so, he becomes very winded.  Does fine around the house and can do normal daily chores, just can't walk outside long distances.  Advised I will send to Dr. Acie Fredrickson for review to see if the plan remains to set up DCCV when he sees Dayna, or plan TEE/DCCV sooner?

## 2020-07-24 NOTE — Progress Notes (Signed)
Called patient to check if he was coming in for his scheduled TEE/DCCV, patient states he was not aware that was scheduled, no one told him that was the plan, so he was willing to still come, but he was not NPO today- so we would not be able to do it. I informed him to call, his main cardiologist office and tell them the situation and they would call us back to put him back on the schedule.

## 2020-07-24 NOTE — Telephone Encounter (Signed)
    Pt would like to ask Dr. Acie Fredrickson what he needs to do since he is back home.

## 2020-07-24 NOTE — Telephone Encounter (Signed)
Spoke with pt and made him aware of recommendations per Dr. Acie Fredrickson.  Pt verbalized understanding and was appreciative for call.

## 2020-07-24 NOTE — Telephone Encounter (Signed)
Pt is basically asymptomatic at this point . Let keep appt with Melina Copa, PA and schedule DCCV after he has been on anticoagulation for 3-4 weeks

## 2020-07-25 NOTE — Discharge Summary (Signed)
Physician Discharge Summary  Logan Wright JJK:[id number] DOB: 1946-02-26 DOA: 07/22/2020  PCP: [name], MD  Admit date: 07/22/2020 Discharge date: 07/25/2020  Admitted From: Home Disposition:  Home  Recommendations for Outpatient Follow-up:  1. Follow up with Cards in 1-2 weeks 2. Please obtain BMP/CBC in one week  Home Health:No Equipment/Devices:None  Discharge Condition:Stable CODE STATUS:Full Diet recommendation: Heart Healthy   Brief/Interim Summary: 74 y.o. 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  She relates that about 4 days prior to admission started having some left upper chest discomfort.  He also noticed some dyspnea on exertion tired and lethargic.  In the ED he was noted to be in A. 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 Cardiology recommended that he could follow-up with them as an outpatient for cardioversion versus rate control management the patient shows rate control for now and will follow up with cardiology.  Chest pain: Likely related to #1 recent Myoview in November 2020 was negative.  Right breast neoplasm in a male: Status post mastectomy 4 months ago under chemotherapy follow-up with  oncologist.  Chronic kidney disease stage T3a: Creatinine appears to baseline no change made to his medication.  Initial hypertension: He will resume his Hyzaar continue with diltiazem as an outpatient.  Hyperlipidemia: Continue statins.  Discharge Instructions  Discharge Instructions    Diet - low sodium heart healthy   Complete by: As directed    Increase activity slowly   Complete by: As directed      Allergies as of 07/23/2020   No Known Allergies     Medication List    STOP taking these medications   dexamethasone 4 MG tablet Commonly known as: DECADRON     TAKE these medications   allopurinol 100 MG tablet Commonly known as: ZYLOPRIM Take 200 mg by mouth daily.   apixaban 5 MG Tabs tablet Commonly known as: ELIQUIS Take 1 tablet (5 mg total) by mouth 2 (two) times daily.   atorvastatin 40 MG tablet Commonly known as: LIPITOR Take 40 mg by mouth at bedtime.   diltiazem 240 MG 24 hr capsule Commonly known as: CARDIZEM CD Take 1 capsule (240 mg total) by mouth daily.   Fish Oil Ultra 1400 MG Caps Take 1,400-2,800 mg by mouth See admin instructions. Take 1400 mg in the morning and 2800 mg in the evening   GLUCOSAMINE-CHONDROITIN PO Take 2 tablets by mouth daily.   HYDROcodone-acetaminophen 5-325 MG tablet Commonly known as: NORCO/VICODIN Take 1 tablet by mouth every 4 (four) hours as needed for moderate pain.   lidocaine-prilocaine cream Commonly known as: EMLA Apply to affected area once What changed:   how much to take  how to take this  when to take this  reasons to take this  additional instructions   LORazepam 0.5 MG tablet Commonly known as: Ativan Take 1 tablet (0.5 mg total) by mouth at bedtime as needed (Nausea or vomiting). What changed: reasons to take this   losartan-hydrochlorothiazide 50-12.5 MG tablet Commonly known as: HYZAAR Take 1 tablet by mouth daily. Start taking on: July 26, 2020   multivitamin tablet Take  1 tablet by mouth daily.       Follow-up Information    Jettie Booze, MD Follow up on 08/27/2020.   Specialties: Cardiology, Radiology, Interventional Cardiology Why: apt @ 9AM Contact information: [address] [place] [address] [phone]              No Known Allergies  Consultations:  Cardiology   Procedures/Studies: CT ANGIO CHEST PE W OR WO CONTRAST  Result Date: 07/22/2020 CLINICAL DATA:  Chest pain. Atrial fibrillation. PE suspected. Right mastectomy for male breast cancer EXAM: CT ANGIOGRAPHY CHEST WITH CONTRAST TECHNIQUE: Multidetector CT imaging of the chest was performed using the standard protocol during bolus administration of intravenous contrast. Multiplanar CT image reconstructions and MIPs were obtained to evaluate the vascular anatomy. CONTRAST:  142mL OMNIPAQUE IOHEXOL 350 MG/ML SOLN COMPARISON:  Chest radiograph earlier today.  No prior CT. FINDINGS: Cardiovascular: The quality of this exam for evaluation of pulmonary embolism is good. The bolus is well timed. Mild limitations secondary to patient body habitus and left arm position, not raised above the head. No evidence of pulmonary embolism. Left-sided Port-A-Cath tip at mid right atrium. Aortic atherosclerosis. Tortuous thoracic aorta. Mild cardiomegaly. Proximal LAD and mid right coronary artery calcification. Mediastinum/Nodes: No supraclavicular adenopathy. No axillary adenopathy. No mediastinal or hilar adenopathy. Lungs/Pleura: No pleural fluid.  Clear lungs. Upper Abdomen: Hepatic steatosis. Normal imaged portions of the spleen, pancreas, adrenal glands. Proximal gastric underdistention. Right mastectomy Musculoskeletal: Probable left-sided gynecomastia. Degenerative changes of both shoulders. Moderate thoracic spondylosis. Review of the MIP images confirms the above findings. IMPRESSION: 1. No evidence of pulmonary embolism. Mild limitations secondary to patient body habitus  and left arm position. 2. Hepatic steatosis. 3. Right mastectomy, without findings of metastatic disease in the chest. 4. Aortic Atherosclerosis (ICD10-I70.0). Coronary artery atherosclerosis. Electronically Signed   By: Abigail Miyamoto M.D.   On: 07/22/2020 07:49   DG Chest Port 1 View  Result Date: 07/22/2020 CLINICAL DATA:  Chest pain.  Atrial fibrillation. EXAM: PORTABLE CHEST 1 VIEW COMPARISON:  05/08/2020 FINDINGS: Left Port-A-Cath has been replaced, terminating in the high right atrium. Patient rotated left. Cardiomegaly accentuated by AP portable technique. Atherosclerosis in the transverse aorta. No pleural effusion or pneumothorax. Pulmonary interstitial thickening is favored to be due to AP portable technique and somewhat low lung volumes. No overt congestive failure. No lobar consolidation. Numerous leads and wires project over the chest. IMPRESSION: Cardiomegaly without congestive failure. Aortic Atherosclerosis (ICD10-I70.0). Electronically Signed   By: Abigail Miyamoto M.D.   On: 07/22/2020 04:58   ECHOCARDIOGRAM COMPLETE  Result Date: 07/23/2020    ECHOCARDIOGRAM REPORT   Patient Name:   RIGGIN CUTTINO Date of Exam: 07/23/2020 Medical Rec #:  [id number]      Height:       67.0 in Accession #:    [id number]     Weight:       227.8 lb Date of Birth:  [birth year]      BSA:          2.137 m Patient Age:    74 years       BP:  92/62 mmHg Patient Gender: M              HR:           86 bpm. Exam Location:  Inpatient Procedure: 2D Echo, Color Doppler and Cardiac Doppler Indications:    I48.91* Unspecified atrial fibrillation  History:        Patient has no prior history of Echocardiogram examinations.                 Arrythmias:Atrial Fibrillation; Risk Factors:Hypertension and                 Dyslipidemia.  Sonographer:    Raquel Sarna Senior RDCS Referring Phys: [id number] [place]  1. Left ventricular ejection fraction, by estimation, is 55 to 60%. The left ventricle has  normal function. The left ventricle has no regional wall motion abnormalities. There is mild concentric left ventricular hypertrophy. Left ventricular diastolic function could not be evaluated.  2. Right ventricular systolic function is normal. The right ventricular size is normal.  3. Left atrial size was moderately dilated.  4. The mitral valve is normal in structure. Trivial mitral valve regurgitation. No evidence of mitral stenosis.  5. The aortic valve is grossly normal. There is mild calcification of the aortic valve. There is mild thickening of the aortic valve. Aortic valve regurgitation is not visualized. Mild aortic valve sclerosis is present, with no evidence of aortic valve stenosis.  6. Aortic dilatation noted. There is mild dilatation of the ascending aorta, measuring 42 mm. Comparison(s): No prior Echocardiogram. Conclusion(s)/Recommendation(s): Otherwise normal echocardiogram, with minor abnormalities described in the report. FINDINGS  Left Ventricle: Left ventricular ejection fraction, by estimation, is 55 to 60%. The left ventricle has normal function. The left ventricle has no regional wall motion abnormalities. The left ventricular internal cavity size was normal in size. There is  mild concentric left ventricular hypertrophy. Left ventricular diastolic function could not be evaluated due to atrial fibrillation. Left ventricular diastolic function could not be evaluated. Right Ventricle: The right ventricular size is normal. No increase in right ventricular wall thickness. Right ventricular systolic function is normal. Left Atrium: Left atrial size was moderately dilated. Right Atrium: Right atrial size was normal in size. Pericardium: Trivial pericardial effusion is present. Mitral Valve: The mitral valve is normal in structure. Trivial mitral valve regurgitation. No evidence of mitral valve stenosis. Tricuspid Valve: The tricuspid valve is normal in structure. Tricuspid valve regurgitation is  trivial. No evidence of tricuspid stenosis. Aortic Valve: The aortic valve is grossly normal. There is mild calcification of the aortic valve. There is mild thickening of the aortic valve. Aortic valve regurgitation is not visualized. Mild aortic valve sclerosis is present, with no evidence of aortic valve stenosis. Pulmonic Valve: The pulmonic valve was grossly normal. Pulmonic valve regurgitation is not visualized. Aorta: Aortic dilatation noted. There is mild dilatation of the ascending aorta, measuring 42 mm. Venous: The inferior vena cava was not well visualized. IAS/Shunts: The atrial septum is grossly normal.  LEFT VENTRICLE PLAX 2D LVIDd:         4.50 cm LVIDs:         3.20 cm LV PW:         1.20 cm LV IVS:        1.20 cm LVOT diam:     2.00 cm LV SV:         55 LV SV Index:   26 LVOT Area:     3.14 cm  RIGHT VENTRICLE RV S prime:     9.25 cm/s TAPSE (M-mode): 2.5 cm LEFT ATRIUM           Index       RIGHT ATRIUM           Index LA diam:      3.00 cm 1.40 cm/m  RA Area:     15.40 cm LA Vol (A2C): 47.6 ml 22.27 ml/m RA Volume:   36.90 ml  17.27 ml/m LA Vol (A4C): 83.7 ml 39.16 ml/m  AORTIC VALVE LVOT Vmax:   77.42 cm/s LVOT Vmean:  57.620 cm/s LVOT VTI:    0.176 m  AORTA Ao Root diam: 3.20 cm Ao Asc diam:  4.20 cm TRICUSPID VALVE TR Peak grad:   12.4 mmHg TR Vmax:        176.00 cm/s  SHUNTS Systemic VTI:  0.18 m Systemic Diam: 2.00 cm Buford Dresser MD Electronically signed by Buford Dresser MD Signature Date/Time: 07/23/2020/2:52:50 PM    Final     (Echo, Carotid, EGD, Colonoscopy, ERCP)    Subjective: No new complaints.  Discharge Exam: Vitals:   07/23/20 1455 07/23/20 1502  BP: 96/62 127/87  Pulse:  87  Resp:    Temp:    SpO2:     Vitals:   07/23/20 0847 07/23/20 1235 07/23/20 1455 07/23/20 1502  BP: (!) 127/94 (!) 93/56 96/62 127/87  Pulse:  74  87  Resp:  17    Temp:  98.3 F (36.8 C)    TempSrc:      SpO2:  95%    Weight:      Height:        General: Pt  is alert, awake, not in acute distress Cardiovascular: RRR, S1/S2 +, no rubs, no gallops Respiratory: CTA bilaterally, no wheezing, no rhonchi Abdominal: Soft, NT, ND, bowel sounds + Extremities: no edema, no cyanosis    The results of significant diagnostics from this hospitalization (including imaging, microbiology, ancillary and laboratory) are listed below for reference.     Microbiology: Recent Results (from the past 240 hour(s))  Respiratory Panel by RT PCR (Flu A&B, Covid) - Nasopharyngeal Swab     Status: None   Collection Time: 07/22/20  7:34 AM   Specimen: Nasopharyngeal Swab  Result Value Ref Range Status   SARS Coronavirus 2 by RT PCR NEGATIVE NEGATIVE Final    Comment: (NOTE) SARS-CoV-2 target nucleic acids are NOT DETECTED.  The SARS-CoV-2 RNA is generally detectable in upper respiratoy specimens during the acute phase of infection. The lowest concentration of SARS-CoV-2 viral copies this assay can detect is 131 copies/mL. A negative result does not preclude SARS-Cov-2 infection and should not be used as the sole basis for treatment or other patient management decisions. A negative result may occur with  improper specimen collection/handling, submission of specimen other than nasopharyngeal swab, presence of viral mutation(s) within the areas targeted by this assay, and inadequate number of viral copies (<131 copies/mL). A negative result must be combined with clinical observations, patient history, and epidemiological information. The expected result is Negative.  Fact Sheet for Patients:  PinkCheek.be  Fact Sheet for Healthcare Providers:  GravelBags.it  This test is no t yet approved or cleared by the [place] FDA and  has been authorized for detection and/or diagnosis of SARS-CoV-2 by FDA under an Emergency Use Authorization (EUA). This EUA will remain  in effect (meaning this test can be used)  for the duration of the COVID-19 declaration under Section  564(b)(1) of the Act, 21 U.S.C. section 360bbb-3(b)(1), unless the authorization is terminated or revoked sooner.     Influenza A by PCR NEGATIVE NEGATIVE Final   Influenza B by PCR NEGATIVE NEGATIVE Final    Comment: (NOTE) The Xpert Xpress SARS-CoV-2/FLU/RSV assay is intended as an aid in  the diagnosis of influenza from Nasopharyngeal swab specimens and  should not be used as a sole basis for treatment. Nasal washings and  aspirates are unacceptable for Xpert Xpress SARS-CoV-2/FLU/RSV  testing.  Fact Sheet for Patients: PinkCheek.be  Fact Sheet for Healthcare Providers: GravelBags.it  This test is not yet approved or cleared by the [place] FDA and  has been authorized for detection and/or diagnosis of SARS-CoV-2 by  FDA under an Emergency Use Authorization (EUA). This EUA will remain  in effect (meaning this test can be used) for the duration of the  Covid-19 declaration under Section 564(b)(1) of the Act, 21  U.S.C. section 360bbb-3(b)(1), unless the authorization is  terminated or revoked. Performed at [facility], [place] [address] [place], [place] [address]      Labs: BNP (last 3 results) Recent Labs    07/22/20 0329  BNP 174.0*   Basic Metabolic Panel: Recent Labs  Lab 07/19/20 1155 07/22/20 0329 07/23/20 0508  NA 140 139 138  K 3.9 3.7 3.7  CL 107 106 106  CO2 27 22 26   GLUCOSE 170* 122* 123*  BUN 23 35* 27*  CREATININE 1.47* 1.55* 1.42*  CALCIUM 9.1 8.6* 8.2*   Liver Function Tests: Recent Labs  Lab 07/19/20 1155  AST 39  ALT 46*  ALKPHOS 78  BILITOT 0.4  PROT 6.5  ALBUMIN 3.3*   No results for input(s): LIPASE, AMYLASE in the last 168 hours. No results for input(s): AMMONIA in the last 168 hours. CBC: Recent Labs  Lab 07/19/20 1155 07/22/20 0329 07/22/20 1049 07/23/20 0508  WBC 4.8 9.3  --  5.5  NEUTROABS  2.0  --   --   --   HGB 14.2 12.8* 12.2* 11.7*  HCT 43.6 39.9 39.1 36.4*  MCV 83.2 86.7  --  85.0  PLT 293 237  --  180   Cardiac Enzymes: No results for input(s): CKTOTAL, CKMB, CKMBINDEX, TROPONINI in the last 168 hours. BNP: Invalid input(s): POCBNP CBG: No results for input(s): GLUCAP in the last 168 hours. D-Dimer No results for input(s): DDIMER in the last 72 hours. Hgb A1c No results for input(s): HGBA1C in the last 72 hours. Lipid Profile No results for input(s): CHOL, HDL, LDLCALC, TRIG, CHOLHDL, LDLDIRECT in the last 72 hours. Thyroid function studies No results for input(s): TSH, T4TOTAL, T3FREE, THYROIDAB in the last 72 hours.  Invalid input(s): FREET3 Anemia work up No results for input(s): VITAMINB12, FOLATE, FERRITIN, TIBC, IRON, RETICCTPCT in the last 72 hours. Urinalysis No results found for: COLORURINE, APPEARANCEUR, [place], [place], [place], [place], [place], [place], PROTEINUR, UROBILINOGEN, NITRITE, LEUKOCYTESUR Sepsis Labs Invalid input(s): PROCALCITONIN,  WBC,  LACTICIDVEN Microbiology Recent Results (from the past 240 hour(s))  Respiratory Panel by RT PCR (Flu A&B, Covid) - Nasopharyngeal Swab     Status: None   Collection Time: 07/22/20  7:34 AM   Specimen: Nasopharyngeal Swab  Result Value Ref Range Status   SARS Coronavirus 2 by RT PCR NEGATIVE NEGATIVE Final    Comment: (NOTE) SARS-CoV-2 target nucleic acids are NOT DETECTED.  The SARS-CoV-2 RNA is generally detectable in upper respiratoy specimens during the acute phase of infection. The lowest concentration of  SARS-CoV-2 viral copies this assay can detect is 131 copies/mL. A negative result does not preclude SARS-Cov-2 infection and should not be used as the sole basis for treatment or other patient management decisions. A negative result may occur with  improper specimen collection/handling, submission of specimen other than nasopharyngeal swab, presence of viral mutation(s) within  the areas targeted by this assay, and inadequate number of viral copies (<131 copies/mL). A negative result must be combined with clinical observations, patient history, and epidemiological information. The expected result is Negative.  Fact Sheet for Patients:  PinkCheek.be  Fact Sheet for Healthcare Providers:  GravelBags.it  This test is no t yet approved or cleared by the [place] FDA and  has been authorized for detection and/or diagnosis of SARS-CoV-2 by FDA under an Emergency Use Authorization (EUA). This EUA will remain  in effect (meaning this test can be used) for the duration of the COVID-19 declaration under Section 564(b)(1) of the Act, 21 U.S.C. section 360bbb-3(b)(1), unless the authorization is terminated or revoked sooner.     Influenza A by PCR NEGATIVE NEGATIVE Final   Influenza B by PCR NEGATIVE NEGATIVE Final    Comment: (NOTE) The Xpert Xpress SARS-CoV-2/FLU/RSV assay is intended as an aid in  the diagnosis of influenza from Nasopharyngeal swab specimens and  should not be used as a sole basis for treatment. Nasal washings and  aspirates are unacceptable for Xpert Xpress SARS-CoV-2/FLU/RSV  testing.  Fact Sheet for Patients: PinkCheek.be  Fact Sheet for Healthcare Providers: GravelBags.it  This test is not yet approved or cleared by the [place] FDA and  has been authorized for detection and/or diagnosis of SARS-CoV-2 by  FDA under an Emergency Use Authorization (EUA). This EUA will remain  in effect (meaning this test can be used) for the duration of the  Covid-19 declaration under Section 564(b)(1) of the Act, 21  U.S.C. section 360bbb-3(b)(1), unless the authorization is  terminated or revoked. Performed at [facility], [place] [address] [place], [place] [address]      Time coordinating discharge: Over 30  minutes  SIGNED:   Charlynne Cousins, MD  [facility] 07/25/2020, 10:19 AM Pager   If 7PM-7AM, please contact night-coverage [email or web address] Password [email or web address]

## 2020-07-30 ENCOUNTER — Encounter: Payer: Self-pay | Admitting: *Deleted

## 2020-08-02 DIAGNOSIS — I4891 Unspecified atrial fibrillation: Secondary | ICD-10-CM | POA: Diagnosis not present

## 2020-08-02 DIAGNOSIS — N183 Chronic kidney disease, stage 3 unspecified: Secondary | ICD-10-CM | POA: Diagnosis not present

## 2020-08-02 DIAGNOSIS — I10 Essential (primary) hypertension: Secondary | ICD-10-CM | POA: Diagnosis not present

## 2020-08-02 DIAGNOSIS — Z9011 Acquired absence of right breast and nipple: Secondary | ICD-10-CM | POA: Diagnosis not present

## 2020-08-02 DIAGNOSIS — Z0000 Encounter for general adult medical examination without abnormal findings: Secondary | ICD-10-CM | POA: Diagnosis not present

## 2020-08-02 DIAGNOSIS — C50921 Malignant neoplasm of unspecified site of right male breast: Secondary | ICD-10-CM | POA: Diagnosis not present

## 2020-08-02 DIAGNOSIS — Z1211 Encounter for screening for malignant neoplasm of colon: Secondary | ICD-10-CM | POA: Diagnosis not present

## 2020-08-02 DIAGNOSIS — M1610 Unilateral primary osteoarthritis, unspecified hip: Secondary | ICD-10-CM | POA: Diagnosis not present

## 2020-08-02 DIAGNOSIS — E782 Mixed hyperlipidemia: Secondary | ICD-10-CM | POA: Diagnosis not present

## 2020-08-02 DIAGNOSIS — Z125 Encounter for screening for malignant neoplasm of prostate: Secondary | ICD-10-CM | POA: Diagnosis not present

## 2020-08-02 DIAGNOSIS — M1710 Unilateral primary osteoarthritis, unspecified knee: Secondary | ICD-10-CM | POA: Diagnosis not present

## 2020-08-03 DIAGNOSIS — I4891 Unspecified atrial fibrillation: Secondary | ICD-10-CM | POA: Diagnosis not present

## 2020-08-03 DIAGNOSIS — Z0000 Encounter for general adult medical examination without abnormal findings: Secondary | ICD-10-CM | POA: Diagnosis not present

## 2020-08-03 DIAGNOSIS — Z1211 Encounter for screening for malignant neoplasm of colon: Secondary | ICD-10-CM | POA: Diagnosis not present

## 2020-08-03 DIAGNOSIS — E782 Mixed hyperlipidemia: Secondary | ICD-10-CM | POA: Diagnosis not present

## 2020-08-03 DIAGNOSIS — N183 Chronic kidney disease, stage 3 unspecified: Secondary | ICD-10-CM | POA: Diagnosis not present

## 2020-08-03 DIAGNOSIS — M1710 Unilateral primary osteoarthritis, unspecified knee: Secondary | ICD-10-CM | POA: Diagnosis not present

## 2020-08-03 DIAGNOSIS — Z125 Encounter for screening for malignant neoplasm of prostate: Secondary | ICD-10-CM | POA: Diagnosis not present

## 2020-08-03 DIAGNOSIS — Z9011 Acquired absence of right breast and nipple: Secondary | ICD-10-CM | POA: Diagnosis not present

## 2020-08-03 DIAGNOSIS — M1610 Unilateral primary osteoarthritis, unspecified hip: Secondary | ICD-10-CM | POA: Diagnosis not present

## 2020-08-03 DIAGNOSIS — I10 Essential (primary) hypertension: Secondary | ICD-10-CM | POA: Diagnosis not present

## 2020-08-03 DIAGNOSIS — C50921 Malignant neoplasm of unspecified site of right male breast: Secondary | ICD-10-CM | POA: Diagnosis not present

## 2020-08-07 ENCOUNTER — Encounter: Payer: Self-pay | Admitting: Physician Assistant

## 2020-08-07 NOTE — Progress Notes (Addendum)
Cardiology Office Note    Date:  08/09/2020   ID:  PAO HAFFEY, DOB 08-Apr-1946, MRN [id number]  PCP:  [name], MD  Cardiologist:  Mertie Moores, MD  Electrophysiologist:  None   Chief Complaint: f/u atrial fib  History of Present Illness:   Logan Wright is a 74 y.o. male with history of right breast cancer status post mastectomy 03/2020 and started chemo 04/2020, CKD stage III,HTN,HLD, mild dilation of ascending aorta by echo 07/2020, pre-diabetes, headaches, ED, GERD, gout and recently diagnosed atrial fibrillation who presents for post-hospital follow-up.  He was recently admitted to the hospital with increasing fatigue and shortness of breath for several weeks.  The 2 days prior to admission he had also had some chest discomfort.  He was found to be in rapid atrial fibrillation.  CT angio showed no PE or metastatic disease but did show hepatic steatosis and coronary atherosclerosis troponin was elevated but flat felt related to demand ischemia. 2D echocardiogram showed EF 55 to 60% with mild LVH, normal RV, moderate left atrial enlargement, mild calcification of the aortic valve with sclerosis but no stenosis and mild dilation of the ascending aorta.  Originally TEE/DCCV was planned as inpatient but ultimately given achievement of rate control, it was recommended to follow-up as an outpatient for consideration of cardioversion after uninterrupted anticoagulation. He was started on oral Eliquis.  He is seen back for follow-up today and was doing well.  He is friends with an anesthesiologist and reports that last Tuesday he was back in sinus rhythm by his apple watch.  He has subsequently purchased a Kardia monitor.  Prior to his recent diagnosis of atrial fibrillation he had remembered having some exercise intolerance.  That has completely resolved back to baseline now that he is in normal rhythm and he has had no further chest discomfort or fatigue with exertion.  He has not had a  sleep study before and does intermittently snore.  Labwork independently reviewed: KPN 08/03/20 LDL 72 07/2020 Hgb 11.7, K 3.7, Cr 1.42, hsTrop peak 68, albumin 3.3, AST 39, ALT 46 No recent LDL or TSH on file  Past Medical History:  Diagnosis Date  . Ascending aorta dilatation (HCC)   . Breast cancer, right ([place])   . CKD (chronic kidney disease), stage III ([place])   . Colon adenoma 2015  . Coronary artery calcification seen on CT scan   . Diverticulosis    DENIES   . DJD (degenerative joint disease)   . ED (erectile dysfunction)   . Elevated blood pressure 2007-2008  . Esophageal reflux   . GERD (gastroesophageal reflux disease)   . Gout   . Gynecomastia    LEFT  . Hearing loss   . HTN (hypertension)   . Hypercholesteremia   . Non-cardiac chest pain   . OA (osteoarthritis)    LEFT SHOULDER  . Onychomycosis of foot with other complication   . PAF (paroxysmal atrial fibrillation) ([place])   . Pre-diabetes   . Seborrhea   . Severe frontal headaches    RESOLVED   . Vertigo    RESOLVED   . Wears glasses   . Wears hearing aid     Past Surgical History:  Procedure Laterality Date  . COLONSCOPY     . HIP ARTHROPLASTY Bilateral    AT [facility]   . IR CV LINE INJECTION  05/14/2020  . IR IMAGING GUIDED PORT INSERTION  05/17/2020  . IR REMOVAL TUN ACCESS W/ PORT  W/O FL MOD SED  05/17/2020  . KNEE ARTHROSCOPY     UNSURE WHICH KNEE   . MASTECTOMY W/ SENTINEL NODE BIOPSY Right 03/16/2020   Procedure: RIGHT BREAST MASTECTOMY WITH SENTINEL LYMPH NODE BIOPSY;  Surgeon: [name], MD;  Location: [facility];  Service: General;  Laterality: Right;  . PORTACATH PLACEMENT Left 05/08/2020   Procedure: INSERTION PORT-A-CATH WITH ULTRASOUND;  Surgeon: [name], MD;  Location: [facility];  Service: General;  Laterality: Left;  LMA  . TOTAL KNEE ARTHROPLASTY Left 11/23/2018   Procedure: TOTAL KNEE ARTHROPLASTY;  Surgeon: Paralee Cancel, MD;  Location: [facility];  Service:  Orthopedics;  Laterality: Left;  70 mins    Current Medications: Current Meds  Medication Sig  . allopurinol (ZYLOPRIM) 100 MG tablet Take 200 mg by mouth daily.  Marland Kitchen apixaban (ELIQUIS) 5 MG TABS tablet Take 1 tablet (5 mg total) by mouth 2 (two) times daily.  Marland Kitchen atorvastatin (LIPITOR) 40 MG tablet Take 40 mg by mouth at bedtime.   Marland Kitchen diltiazem (CARDIZEM CD) 240 MG 24 hr capsule Take 1 capsule (240 mg total) by mouth daily.  Marland Kitchen GLUCOSAMINE-CHONDROITIN PO Take 2 tablets by mouth daily.  Marland Kitchen HYDROcodone-acetaminophen (NORCO/VICODIN) 5-325 MG tablet Take 1 tablet by mouth every 4 (four) hours as needed for moderate pain.  Marland Kitchen lidocaine-prilocaine (EMLA) cream Apply to affected area once  . LORazepam (ATIVAN) 0.5 MG tablet Take 1 tablet (0.5 mg total) by mouth at bedtime as needed (Nausea or vomiting).  Marland Kitchen losartan-hydrochlorothiazide (HYZAAR) 50-12.5 MG tablet Take 1 tablet by mouth daily.  . Multiple Vitamin (MULTIVITAMIN) tablet Take 1 tablet by mouth daily.   . Omega-3 Fatty Acids (FISH OIL ULTRA) 1400 MG CAPS Take 1,400-2,800 mg by mouth See admin instructions. Take 1400 mg in the morning and 2800 mg in the evening     Allergies:   Patient has no known allergies.   Social History   Socioeconomic History  . Marital status: Married    Spouse name: Not on file  . Number of children: Not on file  . Years of education: Not on file  . Highest education level: Not on file  Occupational History  . Not on file  Tobacco Use  . Smoking status: Never Smoker  . Smokeless tobacco: Never Used  Vaping Use  . Vaping Use: Never used  Substance and Sexual Activity  . Alcohol use: No    Alcohol/week: 0.0 standard drinks  . Drug use: No  . Sexual activity: Not on file  Other Topics Concern  . Not on file  Social History Narrative  . Not on file   Social Determinants of Health   Financial Resource Strain:   . Difficulty of Paying Living Expenses: Not on file  Food Insecurity:   . Worried About  Charity fundraiser in the Last Year: Not on file  . Ran Out of Food in the Last Year: Not on file  Transportation Needs:   . Lack of Transportation (Medical): Not on file  . Lack of Transportation (Non-Medical): Not on file  Physical Activity:   . Days of Exercise per Week: Not on file  . Minutes of Exercise per Session: Not on file  Stress:   . Feeling of Stress : Not on file  Social Connections:   . Frequency of Communication with Friends and Family: Not on file  . Frequency of Social Gatherings with Friends and Family: Not on file  . Attends Religious Services: Not on file  .  Active Member of Clubs or Organizations: Not on file  . Attends Archivist Meetings: Not on file  . Marital Status: Not on file     Family History:  The patient's family history includes Heart attack in his mother; Heart failure in his father; Stroke in his mother. There is no history of Hypertension.  ROS:   Please see the history of present illness.  All other systems are reviewed and otherwise negative.    EKGs/Labs/Other Studies Reviewed:    Studies reviewed are outlined and summarized above. Reports included below if pertinent.  2D echo 07/23/20 1. Left ventricular ejection fraction, by estimation, is 55 to 60%. The  left ventricle has normal function. The left ventricle has no regional  wall motion abnormalities. There is mild concentric left ventricular  hypertrophy. Left ventricular diastolic  function could not be evaluated.  2. Right ventricular systolic function is normal. The right ventricular  size is normal.  3. Left atrial size was moderately dilated.  4. The mitral valve is normal in structure. Trivial mitral valve  regurgitation. No evidence of mitral stenosis.  5. The aortic valve is grossly normal. There is mild calcification of the  aortic valve. There is mild thickening of the aortic valve. Aortic valve  regurgitation is not visualized. Mild aortic valve  sclerosis is present,  with no evidence of aortic valve  stenosis.  6. Aortic dilatation noted. There is mild dilatation of the ascending  aorta, measuring 42 mm.     EKG:  EKG is ordered today, personally reviewed, demonstrating NSR 64bpm no acute STT changes  Recent Labs: 07/19/2020: ALT 46 07/22/2020: B Natriuretic Peptide 431.3 07/23/2020: BUN 27; Creatinine, Ser 1.42; Hemoglobin 11.7; Platelets 180; Potassium 3.7; Sodium 138  Recent Lipid Panel No results found for: CHOL, TRIG, HDL, CHOLHDL, VLDL, LDLCALC, LDLDIRECT  PHYSICAL EXAM:    VS:  BP 130/70   Pulse 64   Ht 5\' 7"  (1.702 m)   Wt 224 lb (101.6 kg)   SpO2 98%   BMI 35.08 kg/m   BMI: Body mass index is 35.08 kg/m.  GEN: Well nourished, well developed WM, in no acute distress HEENT: normocephalic, atraumatic Neck: no JVD, carotid bruits, or masses Cardiac: RRR; no murmurs, rubs, or gallops, no edema  Respiratory:  clear to auscultation bilaterally, normal work of breathing GI: soft, rounded, nontender, nondistended, + BS MS: no deformity or atrophy Skin: warm and dry, no rash Neuro:  Alert and Oriented x 3, Strength and sensation are intact, follows commands Psych: euthymic mood, full affect  Wt Readings from Last 3 Encounters:  08/09/20 224 lb (101.6 kg)  07/23/20 227 lb 12.8 oz (103.3 kg)  07/19/20 228 lb (103.4 kg)     ASSESSMENT & PLAN:   1. Paroxysmal atrial fibrillation -back in normal sinus rhythm by EKG today.  We will continue Eliquis and diltiazem.  CHA2DS2VASC is 4. He will continue to surveille for recurrences by his home cardiac monitor and will reach out if he detects any abnormalities or new symptoms.  Will obtain his thyroid function today as well as an updated CBC and CMet to follow-up the minor abnormalities detected in the hospital labs (mild anemia, mildly abnormal LFTs with hepatic steatosis on imaging). Otherwise no need for cardioversion given that he is now back in normal rhythm on his  own.  Given his body mass index, snoring, and atrial fibrillation I would recommend a screening sleep study to exclude sleep apnea.  We also reviewed his  chemotherapy regimen and of the agents he has received, fluorouracil does come with the adverse reaction of a cardiac arrhythmia, unspecified, so we will route this note to oncology to keep them in the loop.  In this age group and body mass index, however, atrial fibrillation is common even without chemotherapy.  He had many questions today but I did my best to answer.  The one that I could not answer was related to clearance to return to scuba diving.  I will reach out to Dr. Acie Fredrickson to discuss. 2. Coronary atherosclerosis on CT scan -minimally elevated troponin in the hospital was felt due to demand ischemia.  He did have some exercise intolerance while he was in atrial fibrillation but this has resolved now that he is back in normal sinus rhythm without any concerning anginal symptoms.  Recommend risk factor reduction.  He does report that his primary care is actively working on adjusting his cholesterol medicine upward with follow-up planned.  Would not start aspirin at this time due to concomitant Eliquis. 3. Essential HTN -would continue to follow on present regimen with no changes made today.  Blood pressure in the hospital was actually on the low side at times. 4. Dilation of ascending aorta -noted on echocardiogram.  This can be followed with repeat study in 1 year, as decided upon by primary cardiologist.  Disposition: F/u with Dr. Acie Fredrickson in 3 months.  Medication Adjustments/Labs and Tests Ordered: Current medicines are reviewed at length with the patient today.  Concerns regarding medicines are outlined above. Medication changes, Labs and Tests ordered today are summarized above and listed in the Patient Instructions accessible in Encounters.   Signed, Charlie Pitter, PA-C  08/09/2020 11:40 AM    [facility] [address], [place], [place]  [address] Phone: [phone]; Fax: [phone]

## 2020-08-09 ENCOUNTER — Other Ambulatory Visit: Payer: Self-pay

## 2020-08-09 ENCOUNTER — Encounter: Payer: Self-pay | Admitting: Physician Assistant

## 2020-08-09 ENCOUNTER — Ambulatory Visit (INDEPENDENT_AMBULATORY_CARE_PROVIDER_SITE_OTHER): Payer: PPO | Admitting: Physician Assistant

## 2020-08-09 VITALS — BP 130/70 | HR 64 | Ht 67.0 in | Wt 224.0 lb

## 2020-08-09 DIAGNOSIS — I10 Essential (primary) hypertension: Secondary | ICD-10-CM | POA: Diagnosis not present

## 2020-08-09 DIAGNOSIS — I77810 Thoracic aortic ectasia: Secondary | ICD-10-CM

## 2020-08-09 DIAGNOSIS — I2510 Atherosclerotic heart disease of native coronary artery without angina pectoris: Secondary | ICD-10-CM

## 2020-08-09 DIAGNOSIS — I480 Paroxysmal atrial fibrillation: Secondary | ICD-10-CM | POA: Diagnosis not present

## 2020-08-09 DIAGNOSIS — I4819 Other persistent atrial fibrillation: Secondary | ICD-10-CM | POA: Diagnosis not present

## 2020-08-09 LAB — COMPREHENSIVE METABOLIC PANEL
ALT: 48 IU/L — ABNORMAL HIGH (ref 0–44)
AST: 40 IU/L (ref 0–40)
Albumin/Globulin Ratio: 1.8 (ref 1.2–2.2)
Albumin: 4.3 g/dL (ref 3.7–4.7)
Alkaline Phosphatase: 87 IU/L (ref 44–121)
BUN/Creatinine Ratio: 13 (ref 10–24)
BUN: 19 mg/dL (ref 8–27)
Bilirubin Total: 0.4 mg/dL (ref 0.0–1.2)
CO2: 24 mmol/L (ref 20–29)
Calcium: 9.7 mg/dL (ref 8.6–10.2)
Chloride: 103 mmol/L (ref 96–106)
Creatinine, Ser: 1.45 mg/dL — ABNORMAL HIGH (ref 0.76–1.27)
GFR calc Af Amer: 54 mL/min/{1.73_m2} — ABNORMAL LOW (ref >59)
GFR calc non Af Amer: 47 mL/min/{1.73_m2} — ABNORMAL LOW (ref >59)
Globulin, Total: 2.4 g/dL (ref 1.5–4.5)
Glucose: 69 mg/dL (ref 65–99)
Potassium: 4.8 mmol/L (ref 3.5–5.2)
Sodium: 142 mmol/L (ref 134–144)
Total Protein: 6.7 g/dL (ref 6.0–8.5)

## 2020-08-09 LAB — CBC
Hematocrit: 44.5 % (ref 37.5–51.0)
Hemoglobin: 14.4 g/dL (ref 13.0–17.7)
MCH: 27.7 pg (ref 26.6–33.0)
MCHC: 32.4 g/dL (ref 31.5–35.7)
MCV: 86 fL (ref 79–97)
Platelets: 359 10*3/uL (ref 150–450)
RBC: 5.2 x10E6/uL (ref 4.14–5.80)
RDW: 14.9 % (ref 11.6–15.4)
WBC: 6.3 10*3/uL (ref 3.4–10.8)

## 2020-08-09 LAB — TSH: TSH: 2.62 u[IU]/mL (ref 0.450–4.500)

## 2020-08-09 NOTE — Addendum Note (Signed)
Addended by: Gaetano Net on: 08/09/2020 11:52 AM   Modules accepted: Orders

## 2020-08-09 NOTE — Patient Instructions (Addendum)
  Medication Instructions:  Your physician recommends that you continue on your current medications as directed. Please refer to the Current Medication list given to you today.  *If you need a refill on your cardiac medications before your next appointment, please call your pharmacy*   Lab Work: TODAY:  BMET, CBC, & TSH  If you have labs (blood work) drawn today and your tests are completely normal, you will receive your results only by: Marland Kitchen MyChart Message (if you have MyChart) OR . A paper copy in the mail If you have any lab test that is abnormal or we need to change your treatment, we will call you to review the results.   Testing/Procedures: Your physician has recommended that you have a sleep study. This test records several body functions during sleep, including: brain activity, eye movement, oxygen and carbon dioxide blood levels, heart rate and rhythm, breathing rate and rhythm, the flow of air through your mouth and nose, snoring, body muscle movements, and chest and belly movement.     Follow-Up: At [facility], you and your health needs are our priority.  As part of our continuing mission to provide you with exceptional heart care, we have created designated Provider Care Teams.  These Care Teams include your primary Cardiologist (physician) and Advanced Practice Providers (APPs -  Physician Assistants and Nurse Practitioners) who all work together to provide you with the care you need, when you need it.  We recommend signing up for the patient portal called "MyChart".  Sign up information is provided on this After Visit Summary.  MyChart is used to connect with patients for Virtual Visits (Telemedicine).  Patients are able to view lab/test results, encounter notes, upcoming appointments, etc.  Non-urgent messages can be sent to your provider as well.   To learn more about what you can do with MyChart, go to [email or web address].    Your next appointment:   3  month(s)  The format for your next appointment:   In Person  Provider:   You may see Mertie Moores, MD or one of the following Advanced Practice Providers on your designated Care Team:    Richardson Dopp, PA-C  Robbie Lis, [place]     Other Instructions

## 2020-08-10 ENCOUNTER — Other Ambulatory Visit: Payer: Self-pay

## 2020-08-10 ENCOUNTER — Encounter: Payer: Self-pay | Admitting: *Deleted

## 2020-08-10 ENCOUNTER — Inpatient Hospital Stay: Payer: PPO

## 2020-08-10 ENCOUNTER — Telehealth: Payer: Self-pay | Admitting: Oncology

## 2020-08-10 ENCOUNTER — Telehealth: Payer: Self-pay | Admitting: Physician Assistant

## 2020-08-10 ENCOUNTER — Inpatient Hospital Stay (HOSPITAL_BASED_OUTPATIENT_CLINIC_OR_DEPARTMENT_OTHER): Payer: PPO | Admitting: Oncology

## 2020-08-10 VITALS — BP 123/69 | HR 69 | Temp 98.0°F | Resp 18 | Ht 67.0 in | Wt 225.1 lb

## 2020-08-10 DIAGNOSIS — Z170 Estrogen receptor positive status [ER+]: Secondary | ICD-10-CM

## 2020-08-10 DIAGNOSIS — C50421 Malignant neoplasm of upper-outer quadrant of right male breast: Secondary | ICD-10-CM

## 2020-08-10 DIAGNOSIS — Z5111 Encounter for antineoplastic chemotherapy: Secondary | ICD-10-CM | POA: Diagnosis not present

## 2020-08-10 DIAGNOSIS — Z9011 Acquired absence of right breast and nipple: Secondary | ICD-10-CM

## 2020-08-10 LAB — CBC WITH DIFFERENTIAL/PLATELET
Abs Immature Granulocytes: 0.3 10*3/uL — ABNORMAL HIGH (ref 0.00–0.07)
Basophils Absolute: 0.1 10*3/uL (ref 0.0–0.1)
Basophils Relative: 1 %
Eosinophils Absolute: 0.1 10*3/uL (ref 0.0–0.5)
Eosinophils Relative: 2 %
HCT: 41.3 % (ref 39.0–52.0)
Hemoglobin: 13.8 g/dL (ref 13.0–17.0)
Immature Granulocytes: 4 %
Lymphocytes Relative: 20 %
Lymphs Abs: 1.4 10*3/uL (ref 0.7–4.0)
MCH: 27.9 pg (ref 26.0–34.0)
MCHC: 33.4 g/dL (ref 30.0–36.0)
MCV: 83.6 fL (ref 80.0–100.0)
Monocytes Absolute: 1.3 10*3/uL — ABNORMAL HIGH (ref 0.1–1.0)
Monocytes Relative: 18 %
Neutro Abs: 3.9 10*3/uL (ref 1.7–7.7)
Neutrophils Relative %: 55 %
Platelets: 303 10*3/uL (ref 150–400)
RBC: 4.94 MIL/uL (ref 4.22–5.81)
RDW: 14.6 % (ref 11.5–15.5)
WBC: 7.1 10*3/uL (ref 4.0–10.5)
nRBC: 0 % (ref 0.0–0.2)

## 2020-08-10 LAB — COMPREHENSIVE METABOLIC PANEL
ALT: 47 U/L — ABNORMAL HIGH (ref 0–44)
AST: 36 U/L (ref 15–41)
Albumin: 3.6 g/dL (ref 3.5–5.0)
Alkaline Phosphatase: 76 U/L (ref 38–126)
Anion gap: 8 (ref 5–15)
BUN: 21 mg/dL (ref 8–23)
CO2: 25 mmol/L (ref 22–32)
Calcium: 9.3 mg/dL (ref 8.9–10.3)
Chloride: 106 mmol/L (ref 98–111)
Creatinine, Ser: 1.36 mg/dL — ABNORMAL HIGH (ref 0.61–1.24)
GFR, Estimated: 54 mL/min — ABNORMAL LOW (ref >60)
Glucose, Bld: 112 mg/dL — ABNORMAL HIGH (ref 70–99)
Potassium: 3.8 mmol/L (ref 3.5–5.1)
Sodium: 139 mmol/L (ref 135–145)
Total Bilirubin: 0.5 mg/dL (ref 0.3–1.2)
Total Protein: 6.6 g/dL (ref 6.5–8.1)

## 2020-08-10 MED ORDER — SODIUM CHLORIDE 0.9% FLUSH
10.0000 mL | INTRAVENOUS | Status: DC | PRN
  Filled 2020-08-10: qty 10

## 2020-08-10 MED ORDER — PALONOSETRON HCL INJECTION 0.25 MG/5ML
INTRAVENOUS | Status: AC
  Filled 2020-08-10: qty 5

## 2020-08-10 MED ORDER — PROCHLORPERAZINE EDISYLATE 10 MG/2ML IJ SOLN
INTRAMUSCULAR | Status: AC
  Filled 2020-08-10: qty 2

## 2020-08-10 MED ORDER — FLUOROURACIL CHEMO INJECTION 2.5 GM/50ML
600.0000 mg/m2 | Freq: Once | INTRAVENOUS | Status: AC
  Administered 2020-08-10: 1300 mg via INTRAVENOUS
  Filled 2020-08-10: qty 26

## 2020-08-10 MED ORDER — METHOTREXATE SODIUM (PF) CHEMO INJECTION 250 MG/10ML
40.0000 mg/m2 | Freq: Once | INTRAMUSCULAR | Status: AC
  Administered 2020-08-10: 87.5 mg via INTRAVENOUS
  Filled 2020-08-10: qty 3.5

## 2020-08-10 MED ORDER — PROCHLORPERAZINE EDISYLATE 10 MG/2ML IJ SOLN
10.0000 mg | Freq: Once | INTRAMUSCULAR | Status: AC
  Administered 2020-08-10: 10 mg via INTRAVENOUS

## 2020-08-10 MED ORDER — HEPARIN SOD (PORK) LOCK FLUSH 100 UNIT/ML IV SOLN
500.0000 [IU] | Freq: Once | INTRAVENOUS | Status: DC | PRN
  Filled 2020-08-10: qty 5

## 2020-08-10 MED ORDER — SODIUM CHLORIDE 0.9 % IV SOLN
Freq: Once | INTRAVENOUS | Status: AC
  Filled 2020-08-10: qty 250

## 2020-08-10 MED ORDER — SODIUM CHLORIDE 0.9 % IV SOLN
600.0000 mg/m2 | Freq: Once | INTRAVENOUS | Status: AC
  Administered 2020-08-10: 1320 mg via INTRAVENOUS
  Filled 2020-08-10: qty 66

## 2020-08-10 MED ORDER — PALONOSETRON HCL INJECTION 0.25 MG/5ML
0.2500 mg | Freq: Once | INTRAVENOUS | Status: AC
  Administered 2020-08-10: 0.25 mg via INTRAVENOUS

## 2020-08-10 MED ORDER — DEXAMETHASONE SODIUM PHOSPHATE 10 MG/ML IJ SOLN
INTRAMUSCULAR | Status: AC
  Filled 2020-08-10: qty 1

## 2020-08-10 MED ORDER — SODIUM CHLORIDE 0.9 % IV SOLN
10.0000 mg | Freq: Once | INTRAVENOUS | Status: AC
  Administered 2020-08-10: 10 mg via INTRAVENOUS
  Filled 2020-08-10: qty 10

## 2020-08-10 NOTE — Telephone Encounter (Signed)
Faxed in PA request with clinical notes to [facility] for split night sleep study.

## 2020-08-10 NOTE — Patient Instructions (Signed)
[facility] Discharge Instructions for Patients Receiving Chemotherapy  Today you received the following chemotherapy agents: cyclophosphamide/methotrexate/fluorouracil.  To help prevent nausea and vomiting after your treatment, we encourage you to take your nausea medication as directed.   If you develop nausea and vomiting that is not controlled by your nausea medication, call the clinic.   BELOW ARE SYMPTOMS THAT SHOULD BE REPORTED IMMEDIATELY:  *FEVER GREATER THAN 100.5 F  *CHILLS WITH OR WITHOUT FEVER  NAUSEA AND VOMITING THAT IS NOT CONTROLLED WITH YOUR NAUSEA MEDICATION  *UNUSUAL SHORTNESS OF BREATH  *UNUSUAL BRUISING OR BLEEDING  TENDERNESS IN MOUTH AND THROAT WITH OR WITHOUT PRESENCE OF ULCERS  *URINARY PROBLEMS  *BOWEL PROBLEMS  UNUSUAL RASH Items with * indicate a potential emergency and should be followed up as soon as possible.  Feel free to call the clinic should you have any questions or concerns. The clinic phone number is [phone].  Please show the CHEMO ALERT CARD at check-in to the Emergency Department and triage nurse.   

## 2020-08-10 NOTE — Telephone Encounter (Signed)
Scheduled appointments per 10/29 los. Spoke to patient who is aware of appointment date and time.  °

## 2020-08-10 NOTE — Progress Notes (Signed)
Logan Wright  [phone]) [phone] [phone]     ID: LAMERE LIGHTNER DOB: Mar 12, 1946  MR#: [id number]  TDS#:[id number]  Patient Care Team: [name], MD as PCP - General (Family Medicine) [name], [name], MD as PCP - Cardiology (Cardiology) [name], [name], MD as Consulting Physician (Oncology) [name], MD as Consulting Physician (General Surgery) Kyung Rudd, MD as Consulting Physician (Radiation Oncology) Lavonna Monarch, MD as Consulting Physician (Dermatology) Paralee Cancel, MD as Consulting Physician (Orthopedic Surgery) Mauro Kaufmann, RN as Oncology Nurse Navigator Rockwell Germany, RN as Oncology Nurse Navigator Chauncey Cruel, MD OTHER MD:  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   He continues on adjuvant chemotherapy with CMF. Today is day 1 cycle 5 out of 8 planned.  Recall his left subclavian port had retracted and needed to be revised 05/17/2020.  Since then the port has been working with no complications.   REVIEW OF SYSTEMS: [name] is generally doing well with his treatment.  He did have that episode of A. fib which put him in the hospital.  He converted spontaneously.  He continues on blood thinners with no bleeding complications and remains in sinus rhythm.  He exercises by walking usually about 1-1/2 to 2 miles most days.  He is adjusting his diet.  A detailed review of systems today was otherwise stable   COVID 19 VACCINATION STATUS: fully vaccinated, with booster   HISTORY OF CURRENT ILLNESS: From the original intake note:  [name] palpated a right breast mass sometime late 2020.  He had a prior left breast mass in the 1990s which was found to be a fat deposit and he assumed this would be the same.  He did mention it to Dr. Jacelyn Grip  at the time of their regular physical and he set [name] up for bilateral diagnostic mammography with tomography and right breast ultrasonography at [facility] on 02/21/2020 showing: breast density category B; 3.2 cm irregular mass at 11 o'clock in the right breast; normal right axillary lymph nodes.  Accordingly on 03/02/2020 the patient proceeded to biopsy of the right breast area in question. The pathology from this procedure ([id number]) showed: invasive ductal carcinoma, grade 2-3. Prognostic indicators significant for: estrogen receptor, 90% positive with strong staining intensity and progesterone receptor, 5% positive with moderate staining intensity. Proliferation marker Ki67 at 15%. HER2 equivocal by immunohistochemistry (2+), but negative by fluorescent in situ hybridization with a signals ratio 1.35 and number per cell 3.1.  He met with Dr. Lisbeth Renshaw on 03/16/2020 to discuss adjuvant radiation therapy. Per consultation note,  postmastectomy radiation was felt to be unlikely.  He proceeded to right mastectomy on 03/16/2020 under Dr. [name]. Pathology from the procedure [id number]) showed: invasive ductal carcinoma, grade 2, 3.2 cm; margins not involved.  All three sentinel lymph nodes removed were negative for carcinoma (0/3).  The patient's subsequent history is as detailed below.   PAST MEDICAL HISTORY: Past Medical History:  Diagnosis Date  . Ascending aorta dilatation (HCC)   . Breast cancer, right ([place])   . CKD (chronic kidney disease), stage III ([place])   . Colon adenoma 2015  . Coronary artery calcification seen on CT scan   . Diverticulosis    DENIES   . DJD (degenerative joint disease)   . ED (erectile dysfunction)   . Elevated  blood pressure 2007-2008  . Esophageal reflux   . GERD (gastroesophageal reflux disease)   . Gout   . Gynecomastia    LEFT  . Hearing loss   . HTN (hypertension)   . Hypercholesteremia   . Non-cardiac chest pain   . OA (osteoarthritis)     LEFT SHOULDER  . Onychomycosis of foot with other complication   . PAF (paroxysmal atrial fibrillation) ([place])   . Pre-diabetes   . Seborrhea   . Severe frontal headaches    RESOLVED   . Vertigo    RESOLVED   . Wears glasses   . Wears hearing aid     PAST SURGICAL HISTORY: Past Surgical History:  Procedure Laterality Date  . COLONSCOPY     . HIP ARTHROPLASTY Bilateral    AT [facility]   . IR CV LINE INJECTION  05/14/2020  . IR IMAGING GUIDED PORT INSERTION  05/17/2020  . IR REMOVAL TUN ACCESS W/ PORT W/O FL MOD SED  05/17/2020  . KNEE ARTHROSCOPY     UNSURE WHICH KNEE   . MASTECTOMY W/ SENTINEL NODE BIOPSY Right 03/16/2020   Procedure: RIGHT BREAST MASTECTOMY WITH SENTINEL LYMPH NODE BIOPSY;  Surgeon: [name], MD;  Location: [facility];  Service: General;  Laterality: Right;  . PORTACATH PLACEMENT Left 05/08/2020   Procedure: INSERTION PORT-A-CATH WITH ULTRASOUND;  Surgeon: [name], MD;  Location: [facility];  Service: General;  Laterality: Left;  LMA  . TOTAL KNEE ARTHROPLASTY Left 11/23/2018   Procedure: TOTAL KNEE ARTHROPLASTY;  Surgeon: Paralee Cancel, MD;  Location: [facility];  Service: Orthopedics;  Laterality: Left;  70 mins    FAMILY HISTORY: Family History  Problem Relation Age of Onset  . Heart attack Mother   . Stroke Mother   . Heart failure Father        34  . Hypertension Neg Hx   The patient is of Ashkenazi background.  His father died at age 70 and his mother at age 38.  On the father's side, the paternal grandfather died young from unclear causes.  The paternal grandmother did not have cancer.  The patient's father was a single child.  On the maternal side the patient's maternal grandmother died young again from unclear reasons but the paternal grandfather and the 3 maternal aunts were all died in old age with no cancer.  The patient also knows of no cancer in his cousins.  He himself has 1 brother with no history of cancer, no  sisters.   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  Previously he used to on [employer] which she sold in 1999.  He is a IT sales professional.  His wife of 48+ years Ivin Booty is very active in the community.  Their daughter Lowella Curb his Engineer, building services for a [facility] psychiatric group.  She lives in [place].  Son Annie Main lives in [place] by himself.  Patient has 3 grandchildren.  He attends [employer]    ADVANCED DIRECTIVES: In the absence of any documentation to the contrary, the patient's spouse is his HCPOA.    HEALTH MAINTENANCE: Social History   Tobacco Use  . Smoking status: Never Smoker  . Smokeless tobacco: Never Used  Vaping Use  . Vaping Use: Never used  Substance Use Topics  . Alcohol use: No    Alcohol/week: 0.0 standard drinks  . Drug  use: No     Colonoscopy: [facility]  Bone density: -  PSA:   No Known Allergies  Current Outpatient Medications  Medication Sig Dispense Refill  . allopurinol (ZYLOPRIM) 100 MG tablet Take 200 mg by mouth daily.    Marland Kitchen apixaban (ELIQUIS) 5 MG TABS tablet Take 1 tablet (5 mg total) by mouth 2 (two) times daily. 60 tablet 3  . atorvastatin (LIPITOR) 40 MG tablet Take 40 mg by mouth at bedtime.     Marland Kitchen diltiazem (CARDIZEM CD) 240 MG 24 hr capsule Take 1 capsule (240 mg total) by mouth daily. 30 capsule 1  . GLUCOSAMINE-CHONDROITIN PO Take 2 tablets by mouth daily.    Marland Kitchen HYDROcodone-acetaminophen (NORCO/VICODIN) 5-325 MG tablet Take 1 tablet by mouth every 4 (four) hours as needed for moderate pain. 25 tablet 0  . lidocaine-prilocaine (EMLA) cream Apply to affected area once 30 g 3  . LORazepam (ATIVAN) 0.5 MG tablet Take 1 tablet (0.5 mg total) by mouth at bedtime as needed (Nausea or vomiting). 20 tablet 0  . losartan-hydrochlorothiazide (HYZAAR) 50-12.5 MG tablet Take 1 tablet by mouth daily. 30 tablet 5   . Multiple Vitamin (MULTIVITAMIN) tablet Take 1 tablet by mouth daily.     . Omega-3 Fatty Acids (FISH OIL ULTRA) 1400 MG CAPS Take 1,400-2,800 mg by mouth See admin instructions. Take 1400 mg in the morning and 2800 mg in the evening     No current facility-administered medications for this visit.   Facility-Administered Medications Ordered in Other Visits  Medication Dose Route Frequency Provider Last Rate Last Admin  . heparin lock flush 100 unit/mL  500 Units Intracatheter Once PRN Gurshan Settlemire, [name], MD      . sodium chloride flush (NS) 0.9 % injection 10 mL  10 mL Intracatheter PRN Lorris Carducci, [name], MD        OBJECTIVE: White man who appears well  Vitals:   08/10/20 1206  BP: 123/69  Pulse: 69  Resp: 18  Temp: 98 F (36.7 C)  SpO2: 95%     Body mass index is 35.26 kg/m.   Wt Readings from Last 3 Encounters:  08/10/20 225 lb 1.6 oz (102.1 kg)  08/09/20 224 lb (101.6 kg)  07/23/20 227 lb 12.8 oz (103.3 kg)      ECOG FS:1 - Symptomatic but completely ambulatory  Sclerae unicteric, EOMs intact Wearing a mask No cervical or supraclavicular adenopathy Lungs no rales or rhonchi Heart regular rate and rhythm Abd soft, nontender, positive bowel sounds MSK no focal spinal tenderness, no upper extremity lymphedema Neuro: nonfocal, well oriented, appropriate affect Breasts: Status post right mastectomy.  There is no evidence of local recurrence.   LAB RESULTS:  CMP     Component Value Date/Time   NA 139 08/10/2020 1150   NA 142 08/09/2020 1153   K 3.8 08/10/2020 1150   CL 106 08/10/2020 1150   CO2 25 08/10/2020 1150   GLUCOSE 112 (H) 08/10/2020 1150   BUN 21 08/10/2020 1150   BUN 19 08/09/2020 1153   CREATININE 1.36 (H) 08/10/2020 1150   CREATININE 1.47 (H) 08/24/2015 1257   CALCIUM 9.3 08/10/2020 1150   PROT 6.6 08/10/2020 1150   PROT 6.7 08/09/2020 1153   ALBUMIN 3.6 08/10/2020 1150   ALBUMIN 4.3 08/09/2020 1153   AST 36 08/10/2020 1150   ALT 47 (H)  08/10/2020 1150   ALKPHOS 76 08/10/2020 1150   BILITOT 0.5 08/10/2020 1150   BILITOT 0.4 08/09/2020 1153   GFRNONAA 54 (  L) 08/10/2020 1150   GFRAA 54 (L) 08/09/2020 1153    No results found for: TOTALPROTELP, ALBUMINELP, A1GS, A2GS, BETS, BETA2SER, GAMS, MSPIKE, SPEI  Lab Results  Component Value Date   WBC 7.1 08/10/2020   NEUTROABS 3.9 08/10/2020   HGB 13.8 08/10/2020   HCT 41.3 08/10/2020   MCV 83.6 08/10/2020   PLT 303 08/10/2020    No results found for: LABCA2  No components found for: [id number]  No results for input(s): INR in the last 168 hours.  No results found for: LABCA2  No results found for: PYK998  No results found for: PJA250  No results found for: NLZ767  No results found for: CA2729  No components found for: HGQUANT  No results found for: CEA1 / No results found for: CEA1   No results found for: AFPTUMOR  No results found for: CHROMOGRNA  No results found for: KPAFRELGTCHN, LAMBDASER, KAPLAMBRATIO (kappa/lambda light chains)  No results found for: HGBA, HGBA2QUANT, HGBFQUANT, HGBSQUAN (Hemoglobinopathy evaluation)   No results found for: LDH  No results found for: IRON, TIBC, IRONPCTSAT (Iron and TIBC)  No results found for: FERRITIN  Urinalysis No results found for: COLORURINE, APPEARANCEUR, LABSPEC, PHURINE, GLUCOSEU, HGBUR, BILIRUBINUR, KETONESUR, PROTEINUR, UROBILINOGEN, NITRITE, LEUKOCYTESUR   STUDIES: CT ANGIO CHEST PE W OR WO CONTRAST  Result Date: 07/22/2020 CLINICAL DATA:  Chest pain. Atrial fibrillation. PE suspected. Right mastectomy for male breast cancer EXAM: CT ANGIOGRAPHY CHEST WITH CONTRAST TECHNIQUE: Multidetector CT imaging of the chest was performed using the standard protocol during bolus administration of intravenous contrast. Multiplanar CT image reconstructions and MIPs were obtained to evaluate the vascular anatomy. CONTRAST:  135m OMNIPAQUE IOHEXOL 350 MG/ML SOLN COMPARISON:  Chest radiograph earlier today.   No prior CT. FINDINGS: Cardiovascular: The quality of this exam for evaluation of pulmonary embolism is good. The bolus is well timed. Mild limitations secondary to patient body habitus and left arm position, not raised above the head. No evidence of pulmonary embolism. Left-sided Port-A-Cath tip at mid right atrium. Aortic atherosclerosis. Tortuous thoracic aorta. Mild cardiomegaly. Proximal LAD and mid right coronary artery calcification. Mediastinum/Nodes: No supraclavicular adenopathy. No axillary adenopathy. No mediastinal or hilar adenopathy. Lungs/Pleura: No pleural fluid.  Clear lungs. Upper Abdomen: Hepatic steatosis. Normal imaged portions of the spleen, pancreas, adrenal glands. Proximal gastric underdistention. Right mastectomy Musculoskeletal: Probable left-sided gynecomastia. Degenerative changes of both shoulders. Moderate thoracic spondylosis. Review of the MIP images confirms the above findings. IMPRESSION: 1. No evidence of pulmonary embolism. Mild limitations secondary to patient body habitus and left arm position. 2. Hepatic steatosis. 3. Right mastectomy, without findings of metastatic disease in the chest. 4. Aortic Atherosclerosis (ICD10-I70.0). Coronary artery atherosclerosis. Electronically Signed   By: KAbigail MiyamotoM.D.   On: 07/22/2020 07:49   DG Chest Port 1 View  Result Date: 07/22/2020 CLINICAL DATA:  Chest pain.  Atrial fibrillation. EXAM: PORTABLE CHEST 1 VIEW COMPARISON:  05/08/2020 FINDINGS: Left Port-A-Cath has been replaced, terminating in the high right atrium. Patient rotated left. Cardiomegaly accentuated by AP portable technique. Atherosclerosis in the transverse aorta. No pleural effusion or pneumothorax. Pulmonary interstitial thickening is favored to be due to AP portable technique and somewhat low lung volumes. No overt congestive failure. No lobar consolidation. Numerous leads and wires project over the chest. IMPRESSION: Cardiomegaly without congestive failure.  Aortic Atherosclerosis (ICD10-I70.0). Electronically Signed   By: KAbigail MiyamotoM.D.   On: 07/22/2020 04:58   ECHOCARDIOGRAM COMPLETE  Result Date: 07/23/2020    ECHOCARDIOGRAM REPORT  Patient Name:   KIVON APREA Date of Exam: 07/23/2020 Medical Rec #:  [id number]      Height:       67.0 in Accession #:    [id number]     Weight:       227.8 lb Date of Birth:  04/03/1946      BSA:          2.137 m Patient Age:    89 years       BP:           92/62 mmHg Patient Gender: M              HR:           86 bpm. Exam Location:  Inpatient Procedure: 2D Echo, Color Doppler and Cardiac Doppler Indications:    I48.91* Unspecified atrial fibrillation  History:        Patient has no prior history of Echocardiogram examinations.                 Arrythmias:Atrial Fibrillation; Risk Factors:Hypertension and                 Dyslipidemia.  Sonographer:    Raquel Sarna Senior RDCS Referring Phys: [id number] [place]  1. Left ventricular ejection fraction, by estimation, is 55 to 60%. The left ventricle has normal function. The left ventricle has no regional wall motion abnormalities. There is mild concentric left ventricular hypertrophy. Left ventricular diastolic function could not be evaluated.  2. Right ventricular systolic function is normal. The right ventricular size is normal.  3. Left atrial size was moderately dilated.  4. The mitral valve is normal in structure. Trivial mitral valve regurgitation. No evidence of mitral stenosis.  5. The aortic valve is grossly normal. There is mild calcification of the aortic valve. There is mild thickening of the aortic valve. Aortic valve regurgitation is not visualized. Mild aortic valve sclerosis is present, with no evidence of aortic valve stenosis.  6. Aortic dilatation noted. There is mild dilatation of the ascending aorta, measuring 42 mm. Comparison(s): No prior Echocardiogram. Conclusion(s)/Recommendation(s): Otherwise normal echocardiogram, with minor  abnormalities described in the report. FINDINGS  Left Ventricle: Left ventricular ejection fraction, by estimation, is 55 to 60%. The left ventricle has normal function. The left ventricle has no regional wall motion abnormalities. The left ventricular internal cavity size was normal in size. There is  mild concentric left ventricular hypertrophy. Left ventricular diastolic function could not be evaluated due to atrial fibrillation. Left ventricular diastolic function could not be evaluated. Right Ventricle: The right ventricular size is normal. No increase in right ventricular wall thickness. Right ventricular systolic function is normal. Left Atrium: Left atrial size was moderately dilated. Right Atrium: Right atrial size was normal in size. Pericardium: Trivial pericardial effusion is present. Mitral Valve: The mitral valve is normal in structure. Trivial mitral valve regurgitation. No evidence of mitral valve stenosis. Tricuspid Valve: The tricuspid valve is normal in structure. Tricuspid valve regurgitation is trivial. No evidence of tricuspid stenosis. Aortic Valve: The aortic valve is grossly normal. There is mild calcification of the aortic valve. There is mild thickening of the aortic valve. Aortic valve regurgitation is not visualized. Mild aortic valve sclerosis is present, with no evidence of aortic valve stenosis. Pulmonic Valve: The pulmonic valve was grossly normal. Pulmonic valve regurgitation is not visualized. Aorta: Aortic dilatation noted. There is mild dilatation of the ascending aorta, measuring 42 mm. Venous: The inferior vena cava was  not well visualized. IAS/Shunts: The atrial septum is grossly normal.  LEFT VENTRICLE PLAX 2D LVIDd:         4.50 cm LVIDs:         3.20 cm LV PW:         1.20 cm LV IVS:        1.20 cm LVOT diam:     2.00 cm LV SV:         55 LV SV Index:   26 LVOT Area:     3.14 cm  RIGHT VENTRICLE RV S prime:     9.25 cm/s TAPSE (M-mode): 2.5 cm LEFT ATRIUM           Index        RIGHT ATRIUM           Index LA diam:      3.00 cm 1.40 cm/m  RA Area:     15.40 cm LA Vol (A2C): 47.6 ml 22.27 ml/m RA Volume:   36.90 ml  17.27 ml/m LA Vol (A4C): 83.7 ml 39.16 ml/m  AORTIC VALVE LVOT Vmax:   77.42 cm/s LVOT Vmean:  57.620 cm/s LVOT VTI:    0.176 m  AORTA Ao Root diam: 3.20 cm Ao Asc diam:  4.20 cm TRICUSPID VALVE TR Peak grad:   12.4 mmHg TR Vmax:        176.00 cm/s  SHUNTS Systemic VTI:  0.18 m Systemic Diam: 2.00 cm Buford Dresser MD Electronically signed by Buford Dresser MD Signature Date/Time: 07/23/2020/2:52:50 PM    Final      ELIGIBLE FOR AVAILABLE RESEARCH PROTOCOL: no  ASSESSMENT: 74 y.o. [place] man status post right breast upper outer quadrant lumpectomy 03/02/2020 for a pT2 pN0, stage IIA invasive ductal carcinoma, grade 2, with negative margins; estrogen receptor strongly positive, progesterone receptor moderately positive, HER-2 negative by FISH, MIB-1 of 15%  (a) a total of 3 sentinel lymph nodes were removed  (1) Oncotype score of 40 predicts a risk of recurrence outside the breast within the next 9 years of 28% if the patient's only systemic therapy is antiestrogens for 5 years.  It also predicts a significant benefit from adjuvant chemotherapy.  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 STK11, TP53, TSC1, TSC2, and VHL.  The following genes were evaluated for sequence changes only: SDHA and HOXB13 c.251G>A variant only.   (4) tamoxifen to start at the completion of chemotherapy   PLAN: Eydan continues to do remarkably well with CMF.  He has essentially no side effects from it that he is aware of.  He has 3 more cycles to go.  I do not think he needs to be seen every cycle.  He knows to let us know if a problem develops.  Accordingly I will see him again in 6 weeks from now  We again discussed diet issues.  It is really a matter of changing the lifestyle.  He tells me his wife Ivin Booty is very good at cooking vegetables and he is really avoiding carbs.   Total encounter time today 20 minutes.Sarajane Jews C. Danen Lapaglia, MD 08/10/20 7:00 PM Medical Oncology and Hematology [facility] [place], [place] [address] Tel. [phone]    Fax. [phone]   I, Wilburn Mylar, am acting as scribe for Dr. [name]. Chayah Mckee.  I, Lurline Del MD, have reviewed the above documentation for accuracy and completeness, and I agree with the above.    *Total Encounter Time as defined by the Centers for Medicare and Medicaid Services includes, in addition to the face-to-face time of a patient visit (documented in the note above) non-face-to-face time: obtaining and reviewing outside history, ordering and reviewing medications, tests or procedures, care coordination (communications with other health care professionals or caregivers) and documentation in the medical record.

## 2020-08-12 ENCOUNTER — Encounter: Payer: Self-pay | Admitting: Oncology

## 2020-08-13 MED ORDER — DILTIAZEM HCL ER COATED BEADS 240 MG PO CP24
240.0000 mg | ORAL_CAPSULE | Freq: Every day | ORAL | 3 refills | Status: DC
Start: 2020-08-13 — End: 2020-09-21

## 2020-08-14 ENCOUNTER — Other Ambulatory Visit: Payer: Self-pay | Admitting: Pharmacist

## 2020-08-14 ENCOUNTER — Other Ambulatory Visit: Payer: Self-pay | Admitting: Oncology

## 2020-08-14 DIAGNOSIS — I480 Paroxysmal atrial fibrillation: Secondary | ICD-10-CM

## 2020-08-14 DIAGNOSIS — Z9011 Acquired absence of right breast and nipple: Secondary | ICD-10-CM

## 2020-08-14 DIAGNOSIS — C50421 Malignant neoplasm of upper-outer quadrant of right male breast: Secondary | ICD-10-CM

## 2020-08-14 DIAGNOSIS — Z170 Estrogen receptor positive status [ER+]: Secondary | ICD-10-CM

## 2020-08-14 MED ORDER — APIXABAN 5 MG PO TABS: 5.0000 mg | ORAL_TABLET | Freq: Two times a day (BID) | ORAL | 1 refills | Status: DC

## 2020-08-14 MED ORDER — LORAZEPAM 0.5 MG PO TABS: 0.5000 mg | ORAL_TABLET | Freq: Every evening | ORAL | 0 refills | Status: DC | PRN

## 2020-08-16 ENCOUNTER — Other Ambulatory Visit: Payer: Self-pay | Admitting: *Deleted

## 2020-08-16 DIAGNOSIS — I480 Paroxysmal atrial fibrillation: Secondary | ICD-10-CM

## 2020-08-16 MED ORDER — APIXABAN 5 MG PO TABS: 5.0000 mg | ORAL_TABLET | Freq: Two times a day (BID) | ORAL | 1 refills | Status: DC

## 2020-08-16 NOTE — Telephone Encounter (Signed)
Prescription refill request for Eliquis received.  Last office visit: [place] 08/09/2020 Scr: 1.36, 08/10/2020 Age: 74 yo Weight: 102.1 kg   Prescription refill sent.

## 2020-08-17 ENCOUNTER — Other Ambulatory Visit: Payer: PPO

## 2020-08-20 ENCOUNTER — Other Ambulatory Visit (HOSPITAL_COMMUNITY): Payer: PPO

## 2020-08-22 ENCOUNTER — Ambulatory Visit (HOSPITAL_COMMUNITY): Admit: 2020-08-22 | Payer: PPO | Admitting: Internal Medicine

## 2020-08-22 ENCOUNTER — Encounter (HOSPITAL_COMMUNITY): Payer: Self-pay

## 2020-08-22 SURGERY — CARDIOVERSION
Anesthesia: Monitor Anesthesia Care

## 2020-08-30 ENCOUNTER — Other Ambulatory Visit: Payer: Self-pay

## 2020-08-30 ENCOUNTER — Ambulatory Visit: Payer: PPO | Admitting: Oncology

## 2020-08-30 ENCOUNTER — Inpatient Hospital Stay: Payer: PPO | Attending: Oncology

## 2020-08-30 ENCOUNTER — Other Ambulatory Visit: Payer: PPO

## 2020-08-30 ENCOUNTER — Inpatient Hospital Stay: Payer: PPO

## 2020-08-30 VITALS — BP 96/59 | HR 56 | Temp 98.2°F | Resp 18

## 2020-08-30 DIAGNOSIS — Z170 Estrogen receptor positive status [ER+]: Secondary | ICD-10-CM

## 2020-08-30 DIAGNOSIS — Z5111 Encounter for antineoplastic chemotherapy: Secondary | ICD-10-CM | POA: Insufficient documentation

## 2020-08-30 DIAGNOSIS — Z9011 Acquired absence of right breast and nipple: Secondary | ICD-10-CM

## 2020-08-30 DIAGNOSIS — C50421 Malignant neoplasm of upper-outer quadrant of right male breast: Secondary | ICD-10-CM | POA: Diagnosis not present

## 2020-08-30 LAB — CBC WITH DIFFERENTIAL/PLATELET
Abs Immature Granulocytes: 0.07 10*3/uL (ref 0.00–0.07)
Basophils Absolute: 0 10*3/uL (ref 0.0–0.1)
Basophils Relative: 1 %
Eosinophils Absolute: 0.1 10*3/uL (ref 0.0–0.5)
Eosinophils Relative: 2 %
HCT: 39.3 % (ref 39.0–52.0)
Hemoglobin: 12.6 g/dL — ABNORMAL LOW (ref 13.0–17.0)
Immature Granulocytes: 2 %
Lymphocytes Relative: 24 %
Lymphs Abs: 1 10*3/uL (ref 0.7–4.0)
MCH: 27.6 pg (ref 26.0–34.0)
MCHC: 32.1 g/dL (ref 30.0–36.0)
MCV: 86.2 fL (ref 80.0–100.0)
Monocytes Absolute: 1 10*3/uL (ref 0.1–1.0)
Monocytes Relative: 24 %
Neutro Abs: 2.1 10*3/uL (ref 1.7–7.7)
Neutrophils Relative %: 47 %
Platelets: 316 10*3/uL (ref 150–400)
RBC: 4.56 MIL/uL (ref 4.22–5.81)
RDW: 14.6 % (ref 11.5–15.5)
WBC: 4.3 10*3/uL (ref 4.0–10.5)
nRBC: 0 % (ref 0.0–0.2)

## 2020-08-30 LAB — COMPREHENSIVE METABOLIC PANEL
ALT: 37 U/L (ref 0–44)
AST: 32 U/L (ref 15–41)
Albumin: 3.5 g/dL (ref 3.5–5.0)
Alkaline Phosphatase: 74 U/L (ref 38–126)
Anion gap: 6 (ref 5–15)
BUN: 26 mg/dL — ABNORMAL HIGH (ref 8–23)
CO2: 28 mmol/L (ref 22–32)
Calcium: 9.3 mg/dL (ref 8.9–10.3)
Chloride: 104 mmol/L (ref 98–111)
Creatinine, Ser: 1.4 mg/dL — ABNORMAL HIGH (ref 0.61–1.24)
GFR, Estimated: 52 mL/min — ABNORMAL LOW (ref >60)
Glucose, Bld: 104 mg/dL — ABNORMAL HIGH (ref 70–99)
Potassium: 4.3 mmol/L (ref 3.5–5.1)
Sodium: 138 mmol/L (ref 135–145)
Total Bilirubin: 0.6 mg/dL (ref 0.3–1.2)
Total Protein: 6.6 g/dL (ref 6.5–8.1)

## 2020-08-30 MED ORDER — PROCHLORPERAZINE EDISYLATE 10 MG/2ML IJ SOLN
INTRAMUSCULAR | Status: AC
  Filled 2020-08-30: qty 2

## 2020-08-30 MED ORDER — METHOTREXATE SODIUM (PF) CHEMO INJECTION 250 MG/10ML
40.0000 mg/m2 | Freq: Once | INTRAMUSCULAR | Status: AC
  Administered 2020-08-30: 87.5 mg via INTRAVENOUS
  Filled 2020-08-30: qty 3.5

## 2020-08-30 MED ORDER — HEPARIN SOD (PORK) LOCK FLUSH 100 UNIT/ML IV SOLN
500.0000 [IU] | Freq: Once | INTRAVENOUS | Status: AC | PRN
  Administered 2020-08-30: 500 [IU]
  Filled 2020-08-30: qty 5

## 2020-08-30 MED ORDER — SODIUM CHLORIDE 0.9% FLUSH
10.0000 mL | INTRAVENOUS | Status: DC | PRN
  Administered 2020-08-30: 10 mL
  Filled 2020-08-30: qty 10

## 2020-08-30 MED ORDER — SODIUM CHLORIDE 0.9 % IV SOLN
10.0000 mg | Freq: Once | INTRAVENOUS | Status: AC
  Administered 2020-08-30: 10 mg via INTRAVENOUS
  Filled 2020-08-30: qty 10

## 2020-08-30 MED ORDER — PALONOSETRON HCL INJECTION 0.25 MG/5ML
INTRAVENOUS | Status: AC
  Filled 2020-08-30: qty 5

## 2020-08-30 MED ORDER — PROCHLORPERAZINE EDISYLATE 10 MG/2ML IJ SOLN
10.0000 mg | Freq: Once | INTRAMUSCULAR | Status: AC
  Administered 2020-08-30: 10 mg via INTRAVENOUS

## 2020-08-30 MED ORDER — PALONOSETRON HCL INJECTION 0.25 MG/5ML
0.2500 mg | Freq: Once | INTRAVENOUS | Status: AC
  Administered 2020-08-30: 0.25 mg via INTRAVENOUS

## 2020-08-30 MED ORDER — FLUOROURACIL CHEMO INJECTION 2.5 GM/50ML
600.0000 mg/m2 | Freq: Once | INTRAVENOUS | Status: AC
  Administered 2020-08-30: 1300 mg via INTRAVENOUS
  Filled 2020-08-30: qty 26

## 2020-08-30 MED ORDER — SODIUM CHLORIDE 0.9 % IV SOLN
Freq: Once | INTRAVENOUS | Status: AC
  Filled 2020-08-30: qty 250

## 2020-08-30 MED ORDER — SODIUM CHLORIDE 0.9 % IV SOLN
600.0000 mg/m2 | Freq: Once | INTRAVENOUS | Status: AC
  Administered 2020-08-30: 1320 mg via INTRAVENOUS
  Filled 2020-08-30: qty 66

## 2020-08-30 NOTE — Patient Instructions (Signed)

## 2020-08-30 NOTE — Patient Instructions (Signed)
[facility] Discharge Instructions for Patients Receiving Chemotherapy  Today you received the following chemotherapy agents: cyclophosphamide/methotrexate/fluorouracil.  To help prevent nausea and vomiting after your treatment, we encourage you to take your nausea medication as directed.   If you develop nausea and vomiting that is not controlled by your nausea medication, call the clinic.   BELOW ARE SYMPTOMS THAT SHOULD BE REPORTED IMMEDIATELY:  *FEVER GREATER THAN 100.5 F  *CHILLS WITH OR WITHOUT FEVER  NAUSEA AND VOMITING THAT IS NOT CONTROLLED WITH YOUR NAUSEA MEDICATION  *UNUSUAL SHORTNESS OF BREATH  *UNUSUAL BRUISING OR BLEEDING  TENDERNESS IN MOUTH AND THROAT WITH OR WITHOUT PRESENCE OF ULCERS  *URINARY PROBLEMS  *BOWEL PROBLEMS  UNUSUAL RASH Items with * indicate a potential emergency and should be followed up as soon as possible.  Feel free to call the clinic should you have any questions or concerns. The clinic phone number is [phone].  Please show the CHEMO ALERT CARD at check-in to the Emergency Department and triage nurse.   

## 2020-09-04 ENCOUNTER — Ambulatory Visit: Payer: PPO | Admitting: Cardiovascular Disease

## 2020-09-19 ENCOUNTER — Telehealth: Payer: Self-pay | Admitting: *Deleted

## 2020-09-19 DIAGNOSIS — Z7901 Long term (current) use of anticoagulants: Secondary | ICD-10-CM | POA: Diagnosis not present

## 2020-09-19 DIAGNOSIS — Z86010 Personal history of colonic polyps: Secondary | ICD-10-CM | POA: Diagnosis not present

## 2020-09-19 DIAGNOSIS — I4891 Unspecified atrial fibrillation: Secondary | ICD-10-CM | POA: Diagnosis not present

## 2020-09-19 NOTE — Telephone Encounter (Signed)
   [facility] Pre-operative Risk Assessment    HEARTCARE STAFF: - Please ensure there is not already an duplicate clearance open for this procedure. - Under Visit Info/Reason for Call, type in Other and utilize the format Clearance MM/DD/YY or Clearance TBD. Do not use dashes or single digits. - If request is for dental extraction, please clarify the # of teeth to be extracted.  Request for surgical clearance:  1. What type of surgery is being performed? COLONOSCOPY   2. When is this surgery scheduled? 10/15/20   3. What type of clearance is required (medical clearance vs. Pharmacy clearance to hold med vs. Both)? BOTH  4. Are there any medications that need to be held prior to surgery and how long? ELIQUIS   5. Practice name and name of physician performing surgery? [facility]; DR. MAGOD   6. What is the office phone number? [phone]   7.   What is the office fax number? [phone]  8.   Anesthesia type (None, local, MAC, general) ? PROPOFOL   Julaine Hua 09/19/2020, 5:11 PM  _________________________________________________________________   (provider comments below)

## 2020-09-19 NOTE — Telephone Encounter (Signed)
Pharmacy, can you please comment on how long Eliquis can be held?  Thank you!  

## 2020-09-20 ENCOUNTER — Inpatient Hospital Stay: Payer: PPO | Attending: Oncology

## 2020-09-20 ENCOUNTER — Inpatient Hospital Stay: Payer: PPO

## 2020-09-20 ENCOUNTER — Inpatient Hospital Stay (HOSPITAL_BASED_OUTPATIENT_CLINIC_OR_DEPARTMENT_OTHER): Payer: PPO | Admitting: Oncology

## 2020-09-20 ENCOUNTER — Other Ambulatory Visit: Payer: Self-pay

## 2020-09-20 ENCOUNTER — Encounter: Payer: Self-pay | Admitting: *Deleted

## 2020-09-20 VITALS — BP 111/51 | HR 55 | Temp 98.0°F | Resp 17 | Ht 67.0 in | Wt 209.4 lb

## 2020-09-20 DIAGNOSIS — Z170 Estrogen receptor positive status [ER+]: Secondary | ICD-10-CM | POA: Insufficient documentation

## 2020-09-20 DIAGNOSIS — Z79810 Long term (current) use of selective estrogen receptor modulators (SERMs): Secondary | ICD-10-CM | POA: Diagnosis not present

## 2020-09-20 DIAGNOSIS — C50421 Malignant neoplasm of upper-outer quadrant of right male breast: Secondary | ICD-10-CM

## 2020-09-20 DIAGNOSIS — Z95828 Presence of other vascular implants and grafts: Secondary | ICD-10-CM

## 2020-09-20 DIAGNOSIS — Z5111 Encounter for antineoplastic chemotherapy: Secondary | ICD-10-CM | POA: Diagnosis not present

## 2020-09-20 DIAGNOSIS — Z9011 Acquired absence of right breast and nipple: Secondary | ICD-10-CM

## 2020-09-20 LAB — CBC WITH DIFFERENTIAL/PLATELET
Abs Immature Granulocytes: 0.22 10*3/uL — ABNORMAL HIGH (ref 0.00–0.07)
Basophils Absolute: 0 10*3/uL (ref 0.0–0.1)
Basophils Relative: 1 %
Eosinophils Absolute: 0.1 10*3/uL (ref 0.0–0.5)
Eosinophils Relative: 2 %
HCT: 38.9 % — ABNORMAL LOW (ref 39.0–52.0)
Hemoglobin: 12.8 g/dL — ABNORMAL LOW (ref 13.0–17.0)
Immature Granulocytes: 4 %
Lymphocytes Relative: 17 %
Lymphs Abs: 1 10*3/uL (ref 0.7–4.0)
MCH: 28.1 pg (ref 26.0–34.0)
MCHC: 32.9 g/dL (ref 30.0–36.0)
MCV: 85.3 fL (ref 80.0–100.0)
Monocytes Absolute: 1.1 10*3/uL — ABNORMAL HIGH (ref 0.1–1.0)
Monocytes Relative: 19 %
Neutro Abs: 3.4 10*3/uL (ref 1.7–7.7)
Neutrophils Relative %: 57 %
Platelets: 273 10*3/uL (ref 150–400)
RBC: 4.56 MIL/uL (ref 4.22–5.81)
RDW: 14.6 % (ref 11.5–15.5)
WBC: 5.8 10*3/uL (ref 4.0–10.5)
nRBC: 0 % (ref 0.0–0.2)

## 2020-09-20 LAB — COMPREHENSIVE METABOLIC PANEL
ALT: 40 U/L (ref 0–44)
AST: 39 U/L (ref 15–41)
Albumin: 3.5 g/dL (ref 3.5–5.0)
Alkaline Phosphatase: 78 U/L (ref 38–126)
Anion gap: 10 (ref 5–15)
BUN: 25 mg/dL — ABNORMAL HIGH (ref 8–23)
CO2: 24 mmol/L (ref 22–32)
Calcium: 9.4 mg/dL (ref 8.9–10.3)
Chloride: 104 mmol/L (ref 98–111)
Creatinine, Ser: 1.47 mg/dL — ABNORMAL HIGH (ref 0.61–1.24)
GFR, Estimated: 49 mL/min — ABNORMAL LOW (ref >60)
Glucose, Bld: 108 mg/dL — ABNORMAL HIGH (ref 70–99)
Potassium: 3.8 mmol/L (ref 3.5–5.1)
Sodium: 138 mmol/L (ref 135–145)
Total Bilirubin: 0.5 mg/dL (ref 0.3–1.2)
Total Protein: 6.8 g/dL (ref 6.5–8.1)

## 2020-09-20 MED ORDER — HEPARIN SOD (PORK) LOCK FLUSH 100 UNIT/ML IV SOLN
500.0000 [IU] | Freq: Once | INTRAVENOUS | Status: AC | PRN
  Administered 2020-09-20: 500 [IU]
  Filled 2020-09-20: qty 5

## 2020-09-20 MED ORDER — METHOTREXATE SODIUM (PF) CHEMO INJECTION 250 MG/10ML
40.0000 mg/m2 | Freq: Once | INTRAMUSCULAR | Status: AC
  Administered 2020-09-20: 87.5 mg via INTRAVENOUS
  Filled 2020-09-20: qty 3.5

## 2020-09-20 MED ORDER — SODIUM CHLORIDE 0.9 % IV SOLN
Freq: Once | INTRAVENOUS | Status: AC
  Filled 2020-09-20: qty 250

## 2020-09-20 MED ORDER — SODIUM CHLORIDE 0.9% FLUSH
10.0000 mL | INTRAVENOUS | Status: DC | PRN
  Administered 2020-09-20: 10 mL
  Filled 2020-09-20: qty 10

## 2020-09-20 MED ORDER — SODIUM CHLORIDE 0.9 % IV SOLN
10.0000 mg | Freq: Once | INTRAVENOUS | Status: AC
  Administered 2020-09-20: 10 mg via INTRAVENOUS
  Filled 2020-09-20: qty 10

## 2020-09-20 MED ORDER — PROCHLORPERAZINE EDISYLATE 10 MG/2ML IJ SOLN
10.0000 mg | Freq: Once | INTRAMUSCULAR | Status: AC
  Administered 2020-09-20: 10 mg via INTRAVENOUS

## 2020-09-20 MED ORDER — SODIUM CHLORIDE 0.9 % IV SOLN
600.0000 mg/m2 | Freq: Once | INTRAVENOUS | Status: AC
  Administered 2020-09-20: 1320 mg via INTRAVENOUS
  Filled 2020-09-20: qty 66

## 2020-09-20 MED ORDER — SODIUM CHLORIDE 0.9% FLUSH
10.0000 mL | Freq: Once | INTRAVENOUS | Status: AC
  Administered 2020-09-20: 10 mL
  Filled 2020-09-20: qty 10

## 2020-09-20 MED ORDER — FLUOROURACIL CHEMO INJECTION 2.5 GM/50ML
600.0000 mg/m2 | Freq: Once | INTRAVENOUS | Status: AC
  Administered 2020-09-20: 1300 mg via INTRAVENOUS
  Filled 2020-09-20: qty 26

## 2020-09-20 MED ORDER — PROCHLORPERAZINE EDISYLATE 10 MG/2ML IJ SOLN
INTRAMUSCULAR | Status: AC
  Filled 2020-09-20: qty 2

## 2020-09-20 MED ORDER — PALONOSETRON HCL INJECTION 0.25 MG/5ML
INTRAVENOUS | Status: AC
  Filled 2020-09-20: qty 5

## 2020-09-20 MED ORDER — PALONOSETRON HCL INJECTION 0.25 MG/5ML
0.2500 mg | Freq: Once | INTRAVENOUS | Status: AC
  Administered 2020-09-20: 0.25 mg via INTRAVENOUS

## 2020-09-20 MED ORDER — TAMOXIFEN CITRATE 20 MG PO TABS: 20.0000 mg | ORAL_TABLET | Freq: Every day | ORAL | 12 refills | Status: DC

## 2020-09-20 NOTE — Telephone Encounter (Signed)
   Primary Cardiologist: Mertie Moores, MD  Chart reviewed as part of pre-operative protocol coverage. Patient was last seen by Melina Copa, PA-C, in 07/2020. Patient was contacted today for further pre-op evaluation and reported doing well since last visit. No chest pain or shortness of breath. No orthopnea. He notes occasional brief episodes of mild lightheadedness with quick position changes but no palpitations, dizziness, syncope. He is very active and is easily able to complete >4.0 METS without any problems. Given past medical history and time since last visit, based on ACC/AHA guidelines, Logan Wright would be at acceptable risk for the planned procedure without further cardiovascular testing.   Per Pharmacy and office protocol, patient can hold Eliquis for 2 days prior to procedure. Patient will not need bridging with Lovenox (enoxaparin) around procedure.  I will route this recommendation to the requesting party via Epic fax function and remove from pre-op pool.  Please call with questions.  Darreld Mclean, PA-C 09/20/2020, 10:23 AM

## 2020-09-20 NOTE — Patient Instructions (Signed)
[facility] Discharge Instructions for Patients Receiving Chemotherapy  Today you received the following chemotherapy agents: cyclophosphamide/methotrexate/fluorouracil.  To help prevent nausea and vomiting after your treatment, we encourage you to take your nausea medication as directed.   If you develop nausea and vomiting that is not controlled by your nausea medication, call the clinic.   BELOW ARE SYMPTOMS THAT SHOULD BE REPORTED IMMEDIATELY:  *FEVER GREATER THAN 100.5 F  *CHILLS WITH OR WITHOUT FEVER  NAUSEA AND VOMITING THAT IS NOT CONTROLLED WITH YOUR NAUSEA MEDICATION  *UNUSUAL SHORTNESS OF BREATH  *UNUSUAL BRUISING OR BLEEDING  TENDERNESS IN MOUTH AND THROAT WITH OR WITHOUT PRESENCE OF ULCERS  *URINARY PROBLEMS  *BOWEL PROBLEMS  UNUSUAL RASH Items with * indicate a potential emergency and should be followed up as soon as possible.  Feel free to call the clinic should you have any questions or concerns. The clinic phone number is [phone].  Please show the CHEMO ALERT CARD at check-in to the Emergency Department and triage nurse.   

## 2020-09-20 NOTE — Progress Notes (Signed)
Pt tolerated tx well, ambulatory w/belongings, steady gait, stable at d/c.

## 2020-09-20 NOTE — Progress Notes (Signed)
[place]  [phone] [phone]     ID: Logan Wright DOB: 12/02/1945  MR#: [id number]  TGG#:[id number]  Patient Care Team: [name], MD as PCP - General (Family Medicine) [name], [name], MD as PCP - Cardiology (Cardiology) Sandeep Delagarza, [name], MD as Consulting Physician (Oncology) [name], MD as Consulting Physician (General Surgery) Kyung Rudd, MD as Consulting Physician (Radiation Oncology) Lavonna Monarch, MD as Consulting Physician (Dermatology) Paralee Cancel, MD as Consulting Physician (Orthopedic Surgery) Mauro Kaufmann, RN as Oncology Nurse Navigator Rockwell Germany, RN as Oncology Nurse Navigator Chauncey Cruel, MD OTHER MD:  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   He continues on adjuvant chemotherapy with CMF. Today is day 1 cycle 7 out of 8 planned.  Recall his left subclavian port had retracted and needed to be revised 05/17/2020.  Since then the port has been working with no complications.   REVIEW OF SYSTEMS: Logan Wright is taking Eliquis for his A. fib.  He tolerates this well.  He notes his blood pressure was a little bit lower today and chemotherapy can lower the blood pressure, usually only for a few months during and after chemo.  In addition though he has lost some weight because of his NOOM diet.  This is the way of monitoring his caloric intake.  He is shooting for 1400 cal daily.  He also walks an hour every day.  [place] are planning to go to [place] midw[place] just for a break.  Detailed review of systems is otherwise stable   COVID 19 VACCINATION STATUS: fully vaccinated, with booster September 2021   HISTORY OF CURRENT ILLNESS: From the original intake note:  Logan Wright palpated a  right breast mass sometime late 2020.  He had a prior left breast mass in the 1990s which was found to be a fat deposit and he assumed this would be the same.  He did mention it to Dr. Jacelyn Grip at the time of their regular physical and he set Logan Wright up for bilateral diagnostic mammography with tomography and right breast ultrasonography at [facility] on 02/21/2020 showing: breast density category B; 3.2 cm irregular mass at 11 o'clock in the right breast; normal right axillary lymph nodes.  Accordingly on 03/02/2020 the patient proceeded to biopsy of the right breast area in question. The pathology from this procedure ([id number]) showed: invasive ductal carcinoma, grade 2-3. Prognostic indicators significant for: estrogen receptor, 90% positive with strong staining intensity and progesterone receptor, 5% positive with moderate staining intensity. Proliferation marker Ki67 at 15%. HER2 equivocal by immunohistochemistry (2+), but negative by fluorescent in situ hybridization with a signals ratio 1.35 and number per cell 3.1.  He met with Dr. Lisbeth Renshaw on 03/16/2020 to discuss adjuvant radiation therapy. Per consultation note,  postmastectomy radiation was felt to be unlikely.  He proceeded to right mastectomy on 03/16/2020 under Dr. [name]. Pathology from the procedure [id number]) showed: invasive ductal carcinoma, grade 2, 3.2 cm; margins not involved.  All three sentinel lymph nodes removed were negative for carcinoma (0/3).  The patient's subsequent history is as detailed below.   PAST MEDICAL HISTORY: Past Medical History:  Diagnosis Date  . Ascending aorta dilatation (HCC)   . Breast cancer, right ([facility])   . CKD (chronic kidney disease), stage III ([place])   .  Colon adenoma 2015  . Coronary artery calcification seen on CT scan   . Diverticulosis    DENIES   . DJD (degenerative joint disease)   . ED (erectile dysfunction)   . Elevated blood pressure 2007-2008  . Esophageal reflux   . GERD  (gastroesophageal reflux disease)   . Gout   . Gynecomastia    LEFT  . Hearing loss   . HTN (hypertension)   . Hypercholesteremia   . Non-cardiac chest pain   . OA (osteoarthritis)    LEFT SHOULDER  . Onychomycosis of foot with other complication   . PAF (paroxysmal atrial fibrillation) ([facility])   . Pre-diabetes   . Seborrhea   . Severe frontal headaches    RESOLVED   . Vertigo    RESOLVED   . Wears glasses   . Wears hearing aid     PAST SURGICAL HISTORY: Past Surgical History:  Procedure Laterality Date  . COLONSCOPY     . HIP ARTHROPLASTY Bilateral    AT [facility]   . IR CV LINE INJECTION  05/14/2020  . IR IMAGING GUIDED PORT INSERTION  05/17/2020  . IR REMOVAL TUN ACCESS W/ PORT W/O FL MOD SED  05/17/2020  . KNEE ARTHROSCOPY     UNSURE WHICH KNEE   . MASTECTOMY W/ SENTINEL NODE BIOPSY Right 03/16/2020   Procedure: RIGHT BREAST MASTECTOMY WITH SENTINEL LYMPH NODE BIOPSY;  Surgeon: [name], MD;  Location: [facility];  Service: General;  Laterality: Right;  . PORTACATH PLACEMENT Left 05/08/2020   Procedure: INSERTION PORT-A-CATH WITH ULTRASOUND;  Surgeon: [name], MD;  Location: [facility];  Service: General;  Laterality: Left;  LMA  . TOTAL KNEE ARTHROPLASTY Left 11/23/2018   Procedure: TOTAL KNEE ARTHROPLASTY;  Surgeon: Paralee Cancel, MD;  Location: [facility];  Service: Orthopedics;  Laterality: Left;  70 mins    FAMILY HISTORY: Family History  Problem Relation Age of Onset  . Heart attack Mother   . Stroke Mother   . Heart failure Father        61  . Hypertension Neg Hx   The patient is of Ashkenazi background.  His father died at age 92 and his mother at age 75.  On the father's side, the paternal grandfather died young from unclear causes.  The paternal grandmother did not have cancer.  The patient's father was a single child.  On the maternal side the patient's maternal grandmother died young again from unclear reasons but the paternal  grandfather and the 3 maternal aunts were all died in old age with no cancer.  The patient also knows of no cancer in his cousins.  He himself has 1 brother with no history of cancer, no sisters.   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  Previously he used to on [employer] which she sold in 1999.  He is a IT sales professional.  His wife of 81+ years Ivin Booty is very active in the community.  Their daughter Logan Wright his Engineer, building services for a [employer] psychiatric group.  She lives in [place].  Son Annie Main lives in [place] by himself.  Patient has 3 grandchildren.  He attends [employer]    ADVANCED DIRECTIVES: In the absence of any documentation to the contrary, the patient's spouse is his HCPOA.    HEALTH MAINTENANCE: Social History   Tobacco Use  . Smoking  status: Never Smoker  . Smokeless tobacco: Never Used  Vaping Use  . Vaping Use: Never used  Substance Use Topics  . Alcohol use: No    Alcohol/week: 0.0 standard drinks  . Drug use: No     Colonoscopy: [facility]  Bone density: -  PSA:   No Known Allergies  Current Outpatient Medications  Medication Sig Dispense Refill  . allopurinol (ZYLOPRIM) 100 MG tablet Take 200 mg by mouth daily.    Marland Kitchen apixaban (ELIQUIS) 5 MG TABS tablet Take 1 tablet (5 mg total) by mouth 2 (two) times daily. 180 tablet 1  . atorvastatin (LIPITOR) 40 MG tablet Take 40 mg by mouth at bedtime.     Marland Kitchen diltiazem (CARDIZEM CD) 240 MG 24 hr capsule Take 1 capsule (240 mg total) by mouth daily. 90 capsule 3  . GLUCOSAMINE-CHONDROITIN PO Take 2 tablets by mouth daily.    Marland Kitchen lidocaine-prilocaine (EMLA) cream Apply to affected area once 30 g 3  . LORazepam (ATIVAN) 0.5 MG tablet Take 1 tablet (0.5 mg total) by mouth at bedtime as needed (Nausea or vomiting). 20 tablet 0  . losartan-hydrochlorothiazide (HYZAAR) 50-12.5 MG tablet  Take 1 tablet by mouth daily. 30 tablet 5  . Multiple Vitamin (MULTIVITAMIN) tablet Take 1 tablet by mouth daily.     . Omega-3 Fatty Acids (FISH OIL ULTRA) 1400 MG CAPS Take 1,400-2,800 mg by mouth See admin instructions. Take 1400 mg in the morning and 2800 mg in the evening    . tamoxifen (NOLVADEX) 20 MG tablet Take 1 tablet (20 mg total) by mouth daily. Start Oct 29, 2020 90 tablet 12   No current facility-administered medications for this visit.    OBJECTIVE: White man in no acute distress  Vitals:   09/20/20 1249  BP: (!) 111/51  Pulse: (!) 55  Resp: 17  Temp: 98 F (36.7 C)  SpO2: 100%     Body mass index is 32.8 kg/m.   Wt Readings from Last 3 Encounters:  09/20/20 209 lb 6.4 oz (95 kg)  08/10/20 225 lb 1.6 oz (102.1 kg)  08/09/20 224 lb (101.6 kg)      ECOG FS:1 - Symptomatic but completely ambulatory  Sclerae unicteric, EOMs intact Wearing a mask No cervical or supraclavicular adenopathy Lungs no rales or rhonchi Heart regular rate and rhythm Abd soft, nontender, positive bowel sounds MSK no focal spinal tenderness, no upper extremity lymphedema Neuro: nonfocal, well oriented, appropriate affect Breasts: Status post right mastectomy   LAB RESULTS:  CMP     Component Value Date/Time   NA 138 09/20/2020 1240   NA 142 08/09/2020 1153   K 3.8 09/20/2020 1240   CL 104 09/20/2020 1240   CO2 24 09/20/2020 1240   GLUCOSE 108 (H) 09/20/2020 1240   BUN 25 (H) 09/20/2020 1240   BUN 19 08/09/2020 1153   CREATININE 1.47 (H) 09/20/2020 1240   CREATININE 1.47 (H) 08/24/2015 1257   CALCIUM 9.4 09/20/2020 1240   PROT 6.8 09/20/2020 1240   PROT 6.7 08/09/2020 1153   ALBUMIN 3.5 09/20/2020 1240   ALBUMIN 4.3 08/09/2020 1153   AST 39 09/20/2020 1240   ALT 40 09/20/2020 1240   ALKPHOS 78 09/20/2020 1240   BILITOT 0.5 09/20/2020 1240   BILITOT 0.4 08/09/2020 1153   GFRNONAA 49 (L) 09/20/2020 1240   GFRAA 54 (L) 08/09/2020 1153    No results found for:  TOTALPROTELP, ALBUMINELP, A1GS, A2GS, BETS, BETA2SER, GAMS, MSPIKE, SPEI  Lab Results  Component Value  Date   WBC 5.8 09/20/2020   NEUTROABS 3.4 09/20/2020   HGB 12.8 (L) 09/20/2020   HCT 38.9 (L) 09/20/2020   MCV 85.3 09/20/2020   PLT 273 09/20/2020    No results found for: LABCA2  No components found for: POEUMP536  No results for input(s): INR in the last 168 hours.  No results found for: LABCA2  No results found for: RWE315  No results found for: QMG867  No results found for: YPP509  No results found for: CA2729  No components found for: HGQUANT  No results found for: CEA1 / No results found for: CEA1   No results found for: AFPTUMOR  No results found for: CHROMOGRNA  No results found for: KPAFRELGTCHN, LAMBDASER, KAPLAMBRATIO (kappa/lambda light chains)  No results found for: HGBA, HGBA2QUANT, HGBFQUANT, HGBSQUAN (Hemoglobinopathy evaluation)   No results found for: LDH  No results found for: IRON, TIBC, IRONPCTSAT (Iron and TIBC)  No results found for: FERRITIN  Urinalysis No results found for: COLORURINE, APPEARANCEUR, LABSPEC, PHURINE, GLUCOSEU, HGBUR, BILIRUBINUR, KETONESUR, PROTEINUR, UROBILINOGEN, NITRITE, LEUKOCYTESUR   STUDIES: No results found.   ELIGIBLE FOR AVAILABLE RESEARCH PROTOCOL: no  ASSESSMENT: 74 y.o. [place] man status post right breast upper outer quadrant lumpectomy 03/02/2020 for a pT2 pN0, stage IIA invasive ductal carcinoma, grade 2, with negative margins; estrogen receptor strongly positive, progesterone receptor moderately positive, HER-2 negative by FISH, MIB-1 of 15%  (a) a total of 3 sentinel lymph nodes were removed  (1) Oncotype score of 40 predicts a risk of recurrence outside the breast within the next 9 years of 28% if the patient's only systemic therapy is antiestrogens for 5 years.  It also predicts a significant benefit from adjuvant chemotherapy.  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 STK11, TP53, TSC1, TSC2, and VHL.  The following genes were evaluated for sequence changes only: SDHA and HOXB13 c.251G>A variant only.   (4) tamoxifen to start 10/30/2019   PLAN: Adolph will receive his seventh of 8 planned CMF cycles today.  He is doing terrific with his treatment, has not lost his hair, and aside from mild to moderate fatigue 24 to 48 hours after a dose he has had no side effects from it.  He has lost a little bit of weight because of his new attention to what he is eating and in between that on the chemotherapy his blood pressure is a little lower.  I wonder if his Cardizem could be cut in half.  He is going to discuss that with his cardiologist.  Once he is done with chemo he will be ready to start tamoxifen.  The target start date is 10/30/2019, Gwynne Edinger Day.  Of course, tamoxifen can cause blood clots and that is a concern in someone with A. fib but he is on Eliquis and likely  will be on it forever so that should take care of that concern.  Usually my male patients on tamoxifen do not report side effects except possibly drop in the [place].  He will see me again in March.  Assuming all is going well at that time we  will setting up for restaging studies in the fall  Total encounter time 25 minutes.* Total encounter time today 20 minutes.Sarajane Jews C. Aaren Krog, MD 09/20/20 1:23 PM Medical Oncology and Hematology [facility] [place], [place] [address] Tel. [phone]    Fax. [phone]   I, Wilburn Mylar, am acting as scribe for Dr. [name]. Glorie Dowlen.  I, Lurline Del MD, have reviewed the above documentation for accuracy and completeness, and I agree with the above.   *Total Encounter Time as defined by the Centers for Medicare and Medicaid Services includes, in addition to the face-to-face time of a patient visit (documented in the note above) non-face-to-face time: obtaining and reviewing outside history, ordering and reviewing medications, tests or procedures, care coordination (communications with other health care professionals or caregivers) and documentation in the medical record.

## 2020-09-20 NOTE — Telephone Encounter (Signed)
Patient with diagnosis of A Fib on Eliquis for anticoagulation.    Procedure: colonoscopy Date of procedure: 10/15/20  CHA2DS2-VASc Score = 4  This indicates a 4.8% annual risk of stroke. The patient's score is based upon: CHF History: No HTN History: Yes Diabetes History: No Stroke History: No Vascular Disease History: Yes Age Score: 2 Gender Score: 0   CrCl 52 mL/min using adjusted body weight Platelet count 316K  Per office protocol, patient can hold Eliquis for 2 days prior to procedure.    Patient will not need bridging with Lovenox (enoxaparin) around procedure.

## 2020-09-21 ENCOUNTER — Telehealth: Payer: Self-pay | Admitting: Oncology

## 2020-09-21 ENCOUNTER — Other Ambulatory Visit: Payer: Self-pay | Admitting: *Deleted

## 2020-09-21 MED ORDER — DILTIAZEM HCL ER COATED BEADS 120 MG PO CP24
120.0000 mg | ORAL_CAPSULE | Freq: Every day | ORAL | 3 refills | Status: DC
Start: 2020-09-21 — End: 2021-09-12

## 2020-09-21 NOTE — Telephone Encounter (Signed)
Scheduled appts per 12/9 los. Was not able to leave voicemail. Pt to get updated appt calendar at next visit per appt notes.

## 2020-09-27 ENCOUNTER — Encounter (HOSPITAL_BASED_OUTPATIENT_CLINIC_OR_DEPARTMENT_OTHER): Payer: PPO | Admitting: Cardiology

## 2020-09-28 DIAGNOSIS — C50921 Malignant neoplasm of unspecified site of right male breast: Secondary | ICD-10-CM | POA: Diagnosis not present

## 2020-10-03 ENCOUNTER — Encounter (HOSPITAL_BASED_OUTPATIENT_CLINIC_OR_DEPARTMENT_OTHER): Payer: PPO | Admitting: Cardiology

## 2020-10-08 DIAGNOSIS — M1610 Unilateral primary osteoarthritis, unspecified hip: Secondary | ICD-10-CM | POA: Diagnosis not present

## 2020-10-08 DIAGNOSIS — E782 Mixed hyperlipidemia: Secondary | ICD-10-CM | POA: Diagnosis not present

## 2020-10-08 DIAGNOSIS — I10 Essential (primary) hypertension: Secondary | ICD-10-CM | POA: Diagnosis not present

## 2020-10-08 DIAGNOSIS — C50921 Malignant neoplasm of unspecified site of right male breast: Secondary | ICD-10-CM | POA: Diagnosis not present

## 2020-10-08 DIAGNOSIS — N183 Chronic kidney disease, stage 3 unspecified: Secondary | ICD-10-CM | POA: Diagnosis not present

## 2020-10-08 DIAGNOSIS — K219 Gastro-esophageal reflux disease without esophagitis: Secondary | ICD-10-CM | POA: Diagnosis not present

## 2020-10-08 DIAGNOSIS — M1710 Unilateral primary osteoarthritis, unspecified knee: Secondary | ICD-10-CM | POA: Diagnosis not present

## 2020-10-08 DIAGNOSIS — I4891 Unspecified atrial fibrillation: Secondary | ICD-10-CM | POA: Diagnosis not present

## 2020-10-09 NOTE — Progress Notes (Signed)
[facility] OFFICE PROGRESS NOTE  [name], MD [place] [address]  DIAGNOSIS: estrogen receptor positive breast cancer  CURRENT THERAPY: Adjuvant chemotherapy. He continues on adjuvant chemotherapy with CMF. Today is day 1 cycle 8 out of 8 planned.  INTERVAL HISTORY: Logan Wright 74 y.o. male returns to the clinic today for a follow-up visit.  The patient was last seen by Dr. Jana Hakim on 09/20/2020.  The patient is here today for his last cycle of adjuvant chemotherapy with CMF.  The patient is planning to start his tamoxifen on 10/29/2020. He needs this resent to his pharmacy. The patient tolerated his last cycle of treatment well except for fatigue which occurred about 24 to 48 hours after his treatment.  The patient continues to be active and walks an hour every day.  Today, the patient denies any fever, chills, night sweats.  He denies any chest pain or shortness of breath.  He denies any rashes or skin changes.  He denies any nausea, vomiting, diarrhea, or constipation. The patient recently had a routine eye exam performed. He also is getting a colonoscopy next week. The patient is here today for evaluation and repeat blood work before starting his last cycle of adjuvant chemotherapy.   HISTORY OF CURRENT ILLNESS: From the original intake note:  AVIK LEONI palpated a right breast mass sometime late 2020.  He had a prior left breast mass in the 1990s which was found to be a fat deposit and he assumed this would be the same.  He did mention it to Dr. Jacelyn Grip at the time of their regular physical and he set [name] up for bilateral diagnostic mammography with tomography and right breast ultrasonography at [facility] on 02/21/2020 showing: breast density category B; 3.2 cm irregular mass at 11 o'clock in the right breast; normal right axillary lymph nodes.  Accordingly on 03/02/2020 the patient proceeded to biopsy of the right breast area in  question. The pathology from this procedure ([id number]) showed: invasive ductal carcinoma, grade 2-3. Prognostic indicators significant for: estrogen receptor, 90% positive with strong staining intensity and progesterone receptor, 5% positive with moderate staining intensity. Proliferation marker Ki67 at 15%. HER2 equivocal by immunohistochemistry (2+), but negative by fluorescent in situ hybridization with a signals ratio 1.35 and number per cell 3.1.  He met with Dr. Lisbeth Renshaw on 03/16/2020 to discuss adjuvant radiation therapy. Per consultation note,  postmastectomy radiation was felt to be unlikely.  He proceeded to right mastectomy on 03/16/2020 under Dr. [name]. Pathology from the procedure [id number]) showed: invasive ductal carcinoma, grade 2, 3.2 cm; margins not involved.  All three sentinel lymph nodes removed were negative for carcinoma (0/3).  The patient's subsequent history is as detailed below.  MEDICAL HISTORY: Past Medical History:  Diagnosis Date  . Ascending aorta dilatation (HCC)   . Breast cancer, right ([facility])   . CKD (chronic kidney disease), stage III ([facility])   . Colon adenoma 2015  . Coronary artery calcification seen on CT scan   . Diverticulosis    DENIES   . DJD (degenerative joint disease)   . ED (erectile dysfunction)   . Elevated blood pressure 2007-2008  . Esophageal reflux   . GERD (gastroesophageal reflux disease)   . Gout   . Gynecomastia    LEFT  . Hearing loss   . HTN (hypertension)   . Hypercholesteremia   . Non-cardiac chest pain   . OA (osteoarthritis)    LEFT  SHOULDER  . Onychomycosis of foot with other complication   . PAF (paroxysmal atrial fibrillation) ([facility])   . Pre-diabetes   . Seborrhea   . Severe frontal headaches    RESOLVED   . Vertigo    RESOLVED   . Wears glasses   . Wears hearing aid     ALLERGIES:  has No Known Allergies.  MEDICATIONS:  Current Outpatient Medications  Medication Sig Dispense Refill  . allopurinol  (ZYLOPRIM) 100 MG tablet Take 200 mg by mouth daily.    Marland Kitchen apixaban (ELIQUIS) 5 MG TABS tablet Take 1 tablet (5 mg total) by mouth 2 (two) times daily. 180 tablet 1  . atorvastatin (LIPITOR) 40 MG tablet Take 40 mg by mouth at bedtime.     Marland Kitchen diltiazem (CARDIZEM CD) 120 MG 24 hr capsule Take 1 capsule (120 mg total) by mouth daily. 90 capsule 3  . GLUCOSAMINE-CHONDROITIN PO Take 2 tablets by mouth daily.    Marland Kitchen lidocaine-prilocaine (EMLA) cream Apply to affected area once 30 g 3  . LORazepam (ATIVAN) 0.5 MG tablet Take 1 tablet (0.5 mg total) by mouth at bedtime as needed (Nausea or vomiting). 20 tablet 0  . losartan-hydrochlorothiazide (HYZAAR) 50-12.5 MG tablet Take 1 tablet by mouth daily. 30 tablet 5  . Multiple Vitamin (MULTIVITAMIN) tablet Take 1 tablet by mouth daily.     . Omega-3 Fatty Acids (FISH OIL ULTRA) 1400 MG CAPS Take 1,400-2,800 mg by mouth See admin instructions. Take 1400 mg in the morning and 2800 mg in the evening    . VASCEPA 1 g capsule Take 2 g by mouth 2 (two) times daily.    . tamoxifen (NOLVADEX) 20 MG tablet Take 1 tablet (20 mg total) by mouth daily. Start Oct 29, 2020 90 tablet 12   No current facility-administered medications for this visit.    SURGICAL HISTORY:  Past Surgical History:  Procedure Laterality Date  . COLONSCOPY     . HIP ARTHROPLASTY Bilateral    AT [facility]   . IR CV LINE INJECTION  05/14/2020  . IR IMAGING GUIDED PORT INSERTION  05/17/2020  . IR REMOVAL TUN ACCESS W/ PORT W/O FL MOD SED  05/17/2020  . KNEE ARTHROSCOPY     UNSURE WHICH KNEE   . MASTECTOMY W/ SENTINEL NODE BIOPSY Right 03/16/2020   Procedure: RIGHT BREAST MASTECTOMY WITH SENTINEL LYMPH NODE BIOPSY;  Surgeon: [name], MD;  Location: [facility];  Service: General;  Laterality: Right;  . PORTACATH PLACEMENT Left 05/08/2020   Procedure: INSERTION PORT-A-CATH WITH ULTRASOUND;  Surgeon: [name], MD;  Location: [facility];  Service: General;  Laterality: Left;   LMA  . TOTAL KNEE ARTHROPLASTY Left 11/23/2018   Procedure: TOTAL KNEE ARTHROPLASTY;  Surgeon: Paralee Cancel, MD;  Location: [facility];  Service: Orthopedics;  Laterality: Left;  70 mins    REVIEW OF SYSTEMS:   Review of Systems  Constitutional: Negative for appetite change, chills, fatigue, fever and unexpected weight change.  HENT:   Negative for mouth sores, nosebleeds, sore throat and trouble swallowing.   Eyes: Negative for eye problems and icterus.  Respiratory: Negative for cough, hemoptysis, shortness of breath and wheezing.   Cardiovascular: Negative for chest pain and leg swelling.  Gastrointestinal: Negative for abdominal pain, constipation, diarrhea, nausea and vomiting.  Genitourinary: Negative for bladder incontinence, difficulty urinating, dysuria, frequency and hematuria.   Musculoskeletal: Negative for back pain, gait problem, neck pain and neck stiffness.  Skin: Negative for itching and  rash.  Neurological: Negative for dizziness, extremity weakness, gait problem, headaches, light-headedness and seizures.  Hematological: Negative for adenopathy. Does not bruise/bleed easily.  Psychiatric/Behavioral: Negative for confusion, depression and sleep disturbance. The patient is not nervous/anxious.     PHYSICAL EXAMINATION:  Blood pressure 120/67, pulse 60, temperature 97.8 F (36.6 C), temperature source Tympanic, resp. rate 18, height 5' 7"  (1.702 m), weight 204 lb 8 oz (92.8 kg), SpO2 98 %.  ECOG PERFORMANCE STATUS: 1 - Symptomatic but completely ambulatory  Physical Exam  Constitutional: Oriented to person, place, and time and well-developed, well-nourished, and in no distress.  HENT:  Head: Normocephalic and atraumatic.  Mouth/Throat: Oropharynx is clear and moist. No oropharyngeal exudate.  Eyes: Conjunctivae are normal. Right eye exhibits no discharge. Left eye exhibits no discharge. No scleral icterus.  Neck: Normal range of motion. Neck supple.  Cardiovascular:  Normal rate, regular rhythm, normal heart sounds and intact distal pulses.   Pulmonary/Chest: Effort normal and breath sounds normal. No respiratory distress. No wheezes. No rales.  Abdominal: Soft. Bowel sounds are normal. Exhibits no distension and no mass. There is no tenderness.  Musculoskeletal: Normal range of motion. Exhibits no edema.  Lymphadenopathy:    No cervical adenopathy.  Neurological: Alert and oriented to person, place, and time. Exhibits normal muscle tone. Gait normal. Coordination normal.  Skin: Skin is warm and dry. No rash noted. Not diaphoretic. No erythema. No pallor.  Psychiatric: Mood, memory and judgment normal.  Vitals reviewed.  LABORATORY DATA: Lab Results  Component Value Date   WBC 4.2 10/11/2020   HGB 12.6 (L) 10/11/2020   HCT 38.3 (L) 10/11/2020   MCV 85.3 10/11/2020   PLT 247 10/11/2020      Chemistry      Component Value Date/Time   NA 141 10/11/2020 1026   NA 142 08/09/2020 1153   K 4.0 10/11/2020 1026   CL 108 10/11/2020 1026   CO2 26 10/11/2020 1026   BUN 24 (H) 10/11/2020 1026   BUN 19 08/09/2020 1153   CREATININE 1.25 (H) 10/11/2020 1026   CREATININE 1.47 (H) 08/24/2015 1257      Component Value Date/Time   CALCIUM 9.2 10/11/2020 1026   ALKPHOS 73 10/11/2020 1026   AST 34 10/11/2020 1026   ALT 36 10/11/2020 1026   BILITOT 0.5 10/11/2020 1026   BILITOT 0.4 08/09/2020 1153       RADIOGRAPHIC STUDIES:  No results found.    ASSESSMENT: 73 y.o. [place] man status post right breast upper outer quadrant lumpectomy 03/02/2020 for a pT2 pN0, stage IIA invasive ductal carcinoma, grade 2, with negative margins; estrogen receptor strongly positive, progesterone receptor moderately positive, HER-2 negative by FISH, MIB-1 of 15%             (a) a total of 3 sentinel lymph nodes were removed  (1) Oncotype score of 40 predicts a risk of recurrence outside the breast within the next 9 years of 28% if the patient's only systemic  therapy is antiestrogens for 5 years.  It also predicts a significant benefit from adjuvant chemotherapy.             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 STK11, TP53, TSC1, TSC2, and VHL.  The following genes were evaluated for sequence changes only: SDHA and HOXB13 c.251G>A variant only.   (4) tamoxifen to start 10/30/2019   PLAN: Joevon will receive his 8 of 8 planned CMF cycles today.  He has tolerated his treatment well except for mild to moderate fatigue 24 to 48 hours after a dose.  The patient was last seen by Dr. Jana Hakim on 09/20/20. At that time, the plan is to start tamoxifen on 10/29/20. The patient is requesting the prescription be resent to the pharmacy for tamoxifen. Apparently the pharmacy deletes prescriptions if not filled in 7 days. I have sent the refill. I gave the patient a handout of education on tamoxifen which we reviewed together. We rediscussed risk for thromboembolic events. The patient is currently taking eliquis. Per Dr. Jana Hakim, the patient is to see him again in March 2022. Per Dr. Jana Hakim, assuming all is going well at that time he will setting up for restaging studies  in the fall. A copy of his appointments was given to the patient. Of course, he knows to call us sooner if he has any concerns in the interval.   I have made flush appointments every 6 weeks for the patient. He is going out of town at the end of February-early march 2022. Request a flush appointment before he leaves.   The patient was advised to call immediately if he has any concerning symptoms in the interval. The patient voices understanding of current disease status and treatment options and is in agreement with the current care plan. All questions were answered. The patient knows to call the clinic with any problems, questions or concerns. We can certainly see the patient much sooner if necessary   No orders of the defined types were placed in this encounter.    [place], PA-C 10/11/20

## 2020-10-10 DIAGNOSIS — H2513 Age-related nuclear cataract, bilateral: Secondary | ICD-10-CM | POA: Diagnosis not present

## 2020-10-10 DIAGNOSIS — H5213 Myopia, bilateral: Secondary | ICD-10-CM | POA: Diagnosis not present

## 2020-10-10 DIAGNOSIS — Z20822 Contact with and (suspected) exposure to covid-19: Secondary | ICD-10-CM | POA: Diagnosis not present

## 2020-10-10 DIAGNOSIS — H25043 Posterior subcapsular polar age-related cataract, bilateral: Secondary | ICD-10-CM | POA: Diagnosis not present

## 2020-10-10 DIAGNOSIS — H40013 Open angle with borderline findings, low risk, bilateral: Secondary | ICD-10-CM | POA: Diagnosis not present

## 2020-10-11 ENCOUNTER — Inpatient Hospital Stay: Payer: PPO

## 2020-10-11 ENCOUNTER — Encounter: Payer: Self-pay | Admitting: *Deleted

## 2020-10-11 ENCOUNTER — Inpatient Hospital Stay (HOSPITAL_BASED_OUTPATIENT_CLINIC_OR_DEPARTMENT_OTHER): Payer: PPO | Admitting: Physician Assistant

## 2020-10-11 ENCOUNTER — Encounter: Payer: Self-pay | Admitting: Physician Assistant

## 2020-10-11 ENCOUNTER — Other Ambulatory Visit: Payer: Self-pay

## 2020-10-11 VITALS — BP 120/67 | HR 60 | Temp 97.8°F | Resp 18 | Ht 67.0 in | Wt 204.5 lb

## 2020-10-11 DIAGNOSIS — Z170 Estrogen receptor positive status [ER+]: Secondary | ICD-10-CM

## 2020-10-11 DIAGNOSIS — Z5111 Encounter for antineoplastic chemotherapy: Secondary | ICD-10-CM | POA: Diagnosis not present

## 2020-10-11 DIAGNOSIS — Z95828 Presence of other vascular implants and grafts: Secondary | ICD-10-CM

## 2020-10-11 DIAGNOSIS — Z9011 Acquired absence of right breast and nipple: Secondary | ICD-10-CM

## 2020-10-11 DIAGNOSIS — C50421 Malignant neoplasm of upper-outer quadrant of right male breast: Secondary | ICD-10-CM

## 2020-10-11 LAB — CBC WITH DIFFERENTIAL/PLATELET
Abs Immature Granulocytes: 0.09 10*3/uL — ABNORMAL HIGH (ref 0.00–0.07)
Basophils Absolute: 0 10*3/uL (ref 0.0–0.1)
Basophils Relative: 1 %
Eosinophils Absolute: 0.1 10*3/uL (ref 0.0–0.5)
Eosinophils Relative: 2 %
HCT: 38.3 % — ABNORMAL LOW (ref 39.0–52.0)
Hemoglobin: 12.6 g/dL — ABNORMAL LOW (ref 13.0–17.0)
Immature Granulocytes: 2 %
Lymphocytes Relative: 20 %
Lymphs Abs: 0.8 10*3/uL (ref 0.7–4.0)
MCH: 28.1 pg (ref 26.0–34.0)
MCHC: 32.9 g/dL (ref 30.0–36.0)
MCV: 85.3 fL (ref 80.0–100.0)
Monocytes Absolute: 1 10*3/uL (ref 0.1–1.0)
Monocytes Relative: 25 %
Neutro Abs: 2.1 10*3/uL (ref 1.7–7.7)
Neutrophils Relative %: 50 %
Platelets: 247 10*3/uL (ref 150–400)
RBC: 4.49 MIL/uL (ref 4.22–5.81)
RDW: 14.9 % (ref 11.5–15.5)
WBC: 4.2 10*3/uL (ref 4.0–10.5)
nRBC: 0 % (ref 0.0–0.2)

## 2020-10-11 LAB — COMPREHENSIVE METABOLIC PANEL
ALT: 36 U/L (ref 0–44)
AST: 34 U/L (ref 15–41)
Albumin: 3.5 g/dL (ref 3.5–5.0)
Alkaline Phosphatase: 73 U/L (ref 38–126)
Anion gap: 7 (ref 5–15)
BUN: 24 mg/dL — ABNORMAL HIGH (ref 8–23)
CO2: 26 mmol/L (ref 22–32)
Calcium: 9.2 mg/dL (ref 8.9–10.3)
Chloride: 108 mmol/L (ref 98–111)
Creatinine, Ser: 1.25 mg/dL — ABNORMAL HIGH (ref 0.61–1.24)
GFR, Estimated: >60 mL/min (ref >60)
Glucose, Bld: 96 mg/dL (ref 70–99)
Potassium: 4 mmol/L (ref 3.5–5.1)
Sodium: 141 mmol/L (ref 135–145)
Total Bilirubin: 0.5 mg/dL (ref 0.3–1.2)
Total Protein: 6.5 g/dL (ref 6.5–8.1)

## 2020-10-11 MED ORDER — SODIUM CHLORIDE 0.9 % IV SOLN
Freq: Once | INTRAVENOUS | Status: AC
  Filled 2020-10-11: qty 250

## 2020-10-11 MED ORDER — TAMOXIFEN CITRATE 20 MG PO TABS: 20.0000 mg | ORAL_TABLET | Freq: Every day | ORAL | 12 refills | Status: AC

## 2020-10-11 MED ORDER — PALONOSETRON HCL INJECTION 0.25 MG/5ML
INTRAVENOUS | Status: AC
  Filled 2020-10-11: qty 5

## 2020-10-11 MED ORDER — PROCHLORPERAZINE EDISYLATE 10 MG/2ML IJ SOLN
INTRAMUSCULAR | Status: AC
  Filled 2020-10-11: qty 2

## 2020-10-11 MED ORDER — FLUOROURACIL CHEMO INJECTION 2.5 GM/50ML
600.0000 mg/m2 | Freq: Once | INTRAVENOUS | Status: AC
  Administered 2020-10-11: 1300 mg via INTRAVENOUS
  Filled 2020-10-11: qty 26

## 2020-10-11 MED ORDER — METHOTREXATE SODIUM (PF) CHEMO INJECTION 250 MG/10ML
40.0000 mg/m2 | Freq: Once | INTRAMUSCULAR | Status: AC
  Administered 2020-10-11: 87.5 mg via INTRAVENOUS
  Filled 2020-10-11: qty 3.5

## 2020-10-11 MED ORDER — PROCHLORPERAZINE EDISYLATE 10 MG/2ML IJ SOLN
10.0000 mg | Freq: Once | INTRAMUSCULAR | Status: AC
  Administered 2020-10-11: 10 mg via INTRAVENOUS

## 2020-10-11 MED ORDER — HEPARIN SOD (PORK) LOCK FLUSH 100 UNIT/ML IV SOLN
500.0000 [IU] | Freq: Once | INTRAVENOUS | Status: AC | PRN
  Administered 2020-10-11: 500 [IU]
  Filled 2020-10-11: qty 5

## 2020-10-11 MED ORDER — SODIUM CHLORIDE 0.9 % IV SOLN
600.0000 mg/m2 | Freq: Once | INTRAVENOUS | Status: AC
  Administered 2020-10-11: 1320 mg via INTRAVENOUS
  Filled 2020-10-11: qty 66

## 2020-10-11 MED ORDER — SODIUM CHLORIDE 0.9 % IV SOLN
10.0000 mg | Freq: Once | INTRAVENOUS | Status: AC
  Administered 2020-10-11: 10 mg via INTRAVENOUS
  Filled 2020-10-11: qty 10

## 2020-10-11 MED ORDER — SODIUM CHLORIDE 0.9% FLUSH
10.0000 mL | Freq: Once | INTRAVENOUS | Status: AC
  Administered 2020-10-11: 10 mL
  Filled 2020-10-11: qty 10

## 2020-10-11 MED ORDER — SODIUM CHLORIDE 0.9% FLUSH
10.0000 mL | INTRAVENOUS | Status: DC | PRN
  Administered 2020-10-11: 10 mL
  Filled 2020-10-11: qty 10

## 2020-10-11 MED ORDER — PALONOSETRON HCL INJECTION 0.25 MG/5ML
0.2500 mg | Freq: Once | INTRAVENOUS | Status: AC
  Administered 2020-10-11: 0.25 mg via INTRAVENOUS

## 2020-10-11 NOTE — Patient Instructions (Signed)
Tamoxifen oral solution What is this medicine? TAMOXIFEN (ta MOX i fen) blocks the effects of estrogen. It is commonly used to treat breast cancer. It is also used to decrease the chance of breast cancer coming back in women who have received treatment for the disease. It may also help prevent breast cancer in women who have a high risk of developing breast cancer. This medicine may be used for other purposes; ask your health care provider or pharmacist if you have questions. COMMON BRAND NAME(S): Soltamox What should I tell my health care provider before I take this medicine? They need to know if you have any of these conditions:  blood clots  blood disease  cataracts or impaired eyesight  endometriosis  high calcium levels  high cholesterol  irregular menstrual cycles  liver disease  stroke  uterine fibroids  an unusual reaction to tamoxifen, other medicines, foods, dyes, or preservatives  pregnant or trying to get pregnant  breast-feeding How should I use this medicine? Take this medicine by mouth with a glass of water. Follow the directions on the prescription label. You can take it with or without food. Take your medicine at regular intervals. Do not take your medicine more often than directed. Do not stop taking except on your doctor's advice. A special MedGuide will be given to you by the pharmacist with each prescription and refill. Be sure to read this information carefully each time. Talk to your pediatrician regarding the use of this medicine in children. While this drug may be prescribed for selected conditions, precautions do apply. Overdosage: If you think you have taken too much of this medicine contact a poison control center or emergency room at once. NOTE: This medicine is only for you. Do not share this medicine with others. What if I miss a dose? If you miss a dose, take it as soon as you can. If it is almost time for your next dose, take only that dose. Do  not take double or extra doses. What may interact with this medicine? Do not take this medicine with any of the following medications:  cisapride  certain medicines for irregular heart beat like dronedarone, quinidine  certain medicines for fungal infection like fluconazole, posaconazole  pimozide  saquinavir  thioridazine This medicine may also interact with the following medications:  aminoglutethimide  anastrozole  bromocriptine  chemotherapy drugs  dofetilide  male hormones, like estrogens and birth control pills  letrozole  medroxyprogesterone  phenobarbital  rifampin  warfarin This list may not describe all possible interactions. Give your health care provider a list of all the medicines, herbs, non-prescription drugs, or dietary supplements you use. Also tell them if you smoke, drink alcohol, or use illegal drugs. Some items may interact with your medicine. What should I watch for while using this medicine? Visit your doctor or health care professional for regular checks on your progress. You will need regular pelvic exams, breast exams, and mammograms. If you are taking this medicine to reduce your risk of getting breast cancer, you should know that this medicine does not prevent all types of breast cancer. If breast cancer or other problems occur, there is no guarantee that it will be found at an early stage. Do not become pregnant while taking this medicine or for 2 months after stopping it. Women should inform their doctor if they wish to become pregnant or think they might be pregnant. There is a potential for serious side effects to an unborn child. Talk to your  health care professional or pharmacist for more information. Do not breast-feed an infant while taking this medicine or for 3 months after stopping it. This medicine may interfere with the ability to have a child. Talk with your doctor or health care professional if you are concerned about your  fertility. What side effects may I notice from receiving this medicine? Side effects that you should report to your doctor or health care professional as soon as possible:  allergic reactions like skin rash, itching or hives, swelling of the face, lips, or tongue  changes in vision  changes in your menstrual cycle  difficulty walking or talking  new breast lumps  numbness  pelvic pain or pressure  redness, blistering, peeling or loosening of the skin, including inside the mouth  signs and symptoms of a dangerous change in heartbeat or heart rhythm like chest pain, dizziness, fast or irregular heartbeat, palpitations, feeling faint or lightheaded, falls, breathing problems  sudden chest pain  swelling, pain or tenderness in your calf or leg  unusual bruising or bleeding  vaginal discharge that is bloody, brown, or rust  weakness  yellowing of the whites of the eyes or skin Side effects that usually do not require medical attention (report to your doctor or health care professional if they continue or are bothersome):  fatigue  hair loss, although uncommon and is usually mild  headache  hot flashes  impotence (in men)  nausea, vomiting (mild)  vaginal discharge (white or clear) This list may not describe all possible side effects. Call your doctor for medical advice about side effects. You may report side effects to FDA at 1-800-FDA-1088. Where should I keep my medicine? Keep out of the reach of children. Store in the original package at room temperature between 20 and 25 degrees C (68 and 77 degrees F). Do not store above 25 degrees C (77 degrees F). DO NOT freeze or refrigerate. Protect from light. Keep container tightly closed. Use within 3 months of opening. Throw away any unused medicine after the expiration date. NOTE: This sheet is a summary. It may not cover all possible information. If you have questions about this medicine, talk to your doctor, pharmacist,  or health care provider.  2020 Elsevier/Gold Standard (2018-09-21 11:14:26)

## 2020-10-11 NOTE — Progress Notes (Signed)
Blood return noted before and after methotrexate and 5-FU

## 2020-10-11 NOTE — Patient Instructions (Signed)

## 2020-10-11 NOTE — Patient Instructions (Signed)
[facility] Discharge Instructions for Patients Receiving Chemotherapy  Today you received the following chemotherapy agents: cyclophosphamide/methotrexate/fluorouracil.  To help prevent nausea and vomiting after your treatment, we encourage you to take your nausea medication as directed.   If you develop nausea and vomiting that is not controlled by your nausea medication, call the clinic.   BELOW ARE SYMPTOMS THAT SHOULD BE REPORTED IMMEDIATELY:  *FEVER GREATER THAN 100.5 F  *CHILLS WITH OR WITHOUT FEVER  NAUSEA AND VOMITING THAT IS NOT CONTROLLED WITH YOUR NAUSEA MEDICATION  *UNUSUAL SHORTNESS OF BREATH  *UNUSUAL BRUISING OR BLEEDING  TENDERNESS IN MOUTH AND THROAT WITH OR WITHOUT PRESENCE OF ULCERS  *URINARY PROBLEMS  *BOWEL PROBLEMS  UNUSUAL RASH Items with * indicate a potential emergency and should be followed up as soon as possible.  Feel free to call the clinic should you have any questions or concerns. The clinic phone number is [phone].  Please show the CHEMO ALERT CARD at check-in to the Emergency Department and triage nurse.   

## 2020-10-15 DIAGNOSIS — D122 Benign neoplasm of ascending colon: Secondary | ICD-10-CM | POA: Diagnosis not present

## 2020-10-15 DIAGNOSIS — K5730 Diverticulosis of large intestine without perforation or abscess without bleeding: Secondary | ICD-10-CM | POA: Diagnosis not present

## 2020-10-15 DIAGNOSIS — Z86010 Personal history of colonic polyps: Secondary | ICD-10-CM | POA: Diagnosis not present

## 2020-10-15 DIAGNOSIS — D124 Benign neoplasm of descending colon: Secondary | ICD-10-CM | POA: Diagnosis not present

## 2020-10-15 DIAGNOSIS — D123 Benign neoplasm of transverse colon: Secondary | ICD-10-CM | POA: Diagnosis not present

## 2020-10-16 ENCOUNTER — Telehealth: Payer: Self-pay | Admitting: Physician Assistant

## 2020-10-16 NOTE — Telephone Encounter (Signed)
Scheduled appts per 12/30 los. Pt confirmed next appt date and time. Mailed appt reminder and calendar per pt's request.

## 2020-10-17 ENCOUNTER — Telehealth: Payer: Self-pay | Admitting: Cardiology

## 2020-10-17 ENCOUNTER — Encounter (HOSPITAL_BASED_OUTPATIENT_CLINIC_OR_DEPARTMENT_OTHER): Payer: Self-pay

## 2020-10-17 DIAGNOSIS — D124 Benign neoplasm of descending colon: Secondary | ICD-10-CM | POA: Diagnosis not present

## 2020-10-17 DIAGNOSIS — D122 Benign neoplasm of ascending colon: Secondary | ICD-10-CM | POA: Diagnosis not present

## 2020-10-17 DIAGNOSIS — D123 Benign neoplasm of transverse colon: Secondary | ICD-10-CM | POA: Diagnosis not present

## 2020-10-17 NOTE — Telephone Encounter (Signed)
Patient is calling in regards to his split night study, scheduled for 11/05/20 with Dr. Radford Pax. He states he has specific questions for her that he would like to have answered prior to proceeding with the appointment. Please return call to discuss further.

## 2020-10-18 ENCOUNTER — Other Ambulatory Visit: Payer: Self-pay | Admitting: Oncology

## 2020-10-23 DIAGNOSIS — I4891 Unspecified atrial fibrillation: Secondary | ICD-10-CM | POA: Diagnosis not present

## 2020-10-23 NOTE — Telephone Encounter (Signed)
Called and spoke to patient regarding his MyChart message below. Offered for patient to have a Video Visit or OV with Dr. Radford Pax to discuss his concerns on having a Sleep Study. The patient declined a visit with Dr. Radford Pax. He states that he wanted her to call him on the phone to discuss and since he did not hear from her he has scheduled an appointment with Dr. Nehemiah Settle with [facility] to discuss the need for a Sleep Study. He states that he will let us know the outcome of that appointment. He denies having any other questions or concerns at this time.

## 2020-10-25 NOTE — Telephone Encounter (Signed)
Reached out to the patient and he states he cancelled his appointment and will reschedule at a later time.

## 2020-11-05 ENCOUNTER — Encounter (HOSPITAL_BASED_OUTPATIENT_CLINIC_OR_DEPARTMENT_OTHER): Payer: PPO | Admitting: Cardiology

## 2020-11-07 ENCOUNTER — Encounter: Payer: Self-pay | Admitting: Cardiovascular Disease

## 2020-11-07 NOTE — Progress Notes (Signed)
Cardiology Office Note:    Date:  11/08/2020   ID:  Logan Wright, DOB 06-Jan-1946, MRN [id number]  PCP:  [name], MD  Cardiologist:  Mertie Moores, MD  Electrophysiologist:  None   Referring MD: [name], MD   Chief Complaint  Patient presents with  . Hypertension     Feb. 10, 2020    Logan Wright is a 75 y.o. male with a hx of hypertension and atypical chest pain.  Asked to see him today for preoperative evaluation  By Dr. Alvan Dame prior to left total knee replacement.  Has seen Dr. Tamala Julian in the past ( at his request)  Now he was told that he needs cardiac clearence.   Exercises regularly .   Aerobic and weight lifting . Does a boot camp several times a week.  He averages exercising 5 times a week. Recently has been having some left knee problems so he is been cycling more.  Not any chest pain or shortness of breath while exercising.  No syncope or presyncope. He has held his aspirin for the past 4 days. Has also been taking Turmeric.     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   Jan. 27, 2022: Logan Wright is seen back today for follow up of his HTN and atrial fib.   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  Was diagnosed with atrial fib .   Was treated with rate control and anticoagulation.  He did not have a cardioversion but converted on his own to NSR   We had referred him for a sleep study - we had tried to refer him to Dr. Radford Pax .   But he never heard back from her .  He has contacted Carlena Sax and will be doing the home sleep study.  Has lost 30 lbs . Does not have any questions   CHADS2VASC is 69 ( age, HTN)     Past Medical History:  Diagnosis Date  . Ascending aorta dilatation (HCC)   . Breast cancer, right ([place])   . CKD (chronic kidney disease), stage III ([place])   . Colon adenoma 2015  . Coronary artery calcification seen on CT scan   . Diverticulosis    DENIES   . DJD (degenerative joint disease)   .  ED (erectile dysfunction)   . Elevated blood pressure 2007-2008  . Esophageal reflux   . GERD (gastroesophageal reflux disease)   . Gout   . Gynecomastia    LEFT  . Hearing loss   . HTN (hypertension)   . Hypercholesteremia   . Non-cardiac chest pain   . OA (osteoarthritis)    LEFT SHOULDER  . Onychomycosis of foot with other complication   . PAF (paroxysmal atrial fibrillation) ([place])   . Pre-diabetes   . Seborrhea   . Severe frontal headaches    RESOLVED   . Vertigo    RESOLVED   . Wears glasses   . Wears hearing aid     Past Surgical History:  Procedure Laterality Date  . COLONSCOPY     . HIP ARTHROPLASTY Bilateral    AT [place]   . IR CV LINE INJECTION  05/14/2020  . IR IMAGING GUIDED PORT INSERTION  05/17/2020  . IR REMOVAL TUN ACCESS W/ PORT W/O FL MOD SED  05/17/2020  . KNEE ARTHROSCOPY     UNSURE WHICH KNEE   . MASTECTOMY W/ SENTINEL NODE BIOPSY Right  03/16/2020   Procedure: RIGHT BREAST MASTECTOMY WITH SENTINEL LYMPH NODE BIOPSY;  Surgeon: [name], MD;  Location: [place];  Service: General;  Laterality: Right;  . PORTACATH PLACEMENT Left 05/08/2020   Procedure: INSERTION PORT-A-CATH WITH ULTRASOUND;  Surgeon: [name], MD;  Location: [place];  Service: General;  Laterality: Left;  LMA  . TOTAL KNEE ARTHROPLASTY Left 11/23/2018   Procedure: TOTAL KNEE ARTHROPLASTY;  Surgeon: Paralee Cancel, MD;  [facility];  Service: Orthopedics;  Laterality: Left;  70 mins    Current Medications: Current Meds  Medication Sig  . allopurinol (ZYLOPRIM) 100 MG tablet Take 200 mg by mouth daily.  Marland Kitchen apixaban (ELIQUIS) 5 MG TABS tablet Take 1 tablet (5 mg total) by mouth 2 (two) times daily.  Marland Kitchen atorvastatin (LIPITOR) 40 MG tablet Take 40 mg by mouth at bedtime.   Marland Kitchen diltiazem (CARDIZEM CD) 120 MG 24 hr capsule Take 1 capsule (120 mg total) by mouth daily.  Marland Kitchen GLUCOSAMINE-CHONDROITIN PO Take 2 tablets by mouth daily.  Marland Kitchen lidocaine-prilocaine (EMLA)  cream Apply to affected area once  . LORazepam (ATIVAN) 0.5 MG tablet Take 1 tablet (0.5 mg total) by mouth at bedtime as needed (Nausea or vomiting).  Marland Kitchen losartan-hydrochlorothiazide (HYZAAR) 50-12.5 MG tablet Take 1 tablet by mouth daily.  . Multiple Vitamin (MULTIVITAMIN) tablet Take 1 tablet by mouth daily.   . Omega-3 Fatty Acids (FISH OIL ULTRA) 1400 MG CAPS Take 1,400-2,800 mg by mouth See admin instructions. Take 1400 mg in the morning and 2800 mg in the evening  . Potassium 99 MG TABS Take 1 tablet by mouth daily.  . tamoxifen (NOLVADEX) 20 MG tablet Take 1 tablet (20 mg total) by mouth daily. Start Oct 29, 2020  . VASCEPA 1 g capsule Take 2 g by mouth 2 (two) times daily.     Allergies:   Patient has no known allergies.   Social History   Socioeconomic History  . Marital status: Married    Spouse name: Not on file  . Number of children: Not on file  . Years of education: Not on file  . Highest education level: Not on file  Occupational History  . Not on file  Tobacco Use  . Smoking status: Never Smoker  . Smokeless tobacco: Never Used  Vaping Use  . Vaping Use: Never used  Substance and Sexual Activity  . Alcohol use: No    Alcohol/week: 0.0 standard drinks  . Drug use: No  . Sexual activity: Not on file  Other Topics Concern  . Not on file  Social History Narrative  . Not on file   Social Determinants of Health   Financial Resource Strain: Not on file  Food Insecurity: Not on file  Transportation Needs: Not on file  Physical Activity: Not on file  Stress: Not on file  Social Connections: Not on file     Family History: The patient's family history includes Heart attack in his mother; Heart failure in his father; Stroke in his mother. There is no history of Hypertension.  ROS:   Please see the history of present illness.     All other systems reviewed and are negative.  EKGs/Labs/Other Studies Reviewed:    The following studies were reviewed  today:   EKG:      Recent Labs: 07/22/2020: B Natriuretic Peptide 431.3 08/09/2020: TSH 2.620 10/11/2020: ALT 36; BUN 24; Creatinine, Ser 1.25; Hemoglobin 12.6; Platelets 247; Potassium 4.0; Sodium 141  Recent Lipid Panel No results  found for: CHOL, TRIG, HDL, CHOLHDL, VLDL, LDLCALC, LDLDIRECT  Physical Exam:    Physical Exam: Blood pressure 110/64, pulse 64, height 5\' 7"  (1.702 m), weight 197 lb 9.6 oz (89.6 kg), SpO2 97 %.  GEN:  Well nourished, well developed in no acute distress HEENT: Normal NECK: No JVD; No carotid bruits LYMPHATICS: No lymphadenopathy CARDIAC: RRR , no murmurs, rubs, gallops RESPIRATORY:  Clear to auscultation without rales, wheezing or rhonchi  ABDOMEN: Soft, non-tender, non-distended MUSCULOSKELETAL:  No edema; No deformity  SKIN: Warm and dry NEUROLOGIC:  Alert and oriented x 3   ASSESSMENT:    No diagnosis found. PLAN:      1.  HTN:     bp is well controlled  2.  Atrial fib :   PAF ,cont eliquis  Has lost weight .  Sleep study at home tonight     Medication Adjustments/Labs and Tests Ordered: Current medicines are reviewed at length with the patient today.  Concerns regarding medicines are outlined above.  No orders of the defined types were placed in this encounter.  No orders of the defined types were placed in this encounter.   Patient Instructions  Medication Instructions:  Your physician recommends that you continue on your current medications as directed. Please refer to the Current Medication list given to you today.  *If you need a refill on your cardiac medications before your next appointment, please call your pharmacy*   Lab Work: None Ordered If you have labs (blood work) drawn today and your tests are completely normal, you will receive your results only by: Marland Kitchen MyChart Message (if you have MyChart) OR . A paper copy in the mail If you have any lab test that is abnormal or we need to change your treatment, we will  call you to review the results.   Testing/Procedures: None Ordered   Follow-Up: At [facility], you and your health needs are our priority.  As part of our continuing mission to provide you with exceptional heart care, we have created designated Provider Care Teams.  These Care Teams include your primary Cardiologist (physician) and Advanced Practice Providers (APPs -  Physician Assistants and Nurse Practitioners) who all work together to provide you with the care you need, when you need it.  Your next appointment:   1 year(s)  The format for your next appointment:   In Person  Provider:   You may see Mertie Moores, MD or one of the following Advanced Practice Providers on your designated Care Team:    Richardson Dopp, PA-C  Robbie Lis, [place]        Signed, Mertie Moores, MD  11/08/2020 6:15 PM    [place]

## 2020-11-08 ENCOUNTER — Other Ambulatory Visit: Payer: Self-pay

## 2020-11-08 ENCOUNTER — Ambulatory Visit: Payer: PPO | Admitting: Cardiovascular Disease

## 2020-11-08 ENCOUNTER — Encounter: Payer: Self-pay | Admitting: Cardiovascular Disease

## 2020-11-08 VITALS — BP 110/64 | HR 64 | Ht 67.0 in | Wt 197.6 lb

## 2020-11-08 DIAGNOSIS — I10 Essential (primary) hypertension: Secondary | ICD-10-CM

## 2020-11-08 DIAGNOSIS — I4891 Unspecified atrial fibrillation: Secondary | ICD-10-CM

## 2020-11-08 DIAGNOSIS — R0902 Hypoxemia: Secondary | ICD-10-CM | POA: Diagnosis not present

## 2020-11-08 NOTE — Patient Instructions (Signed)

## 2020-11-15 NOTE — Telephone Encounter (Signed)
Lm to call back ./cy 

## 2020-11-15 NOTE — Telephone Encounter (Signed)
Pt aware of recommendations and agrees with plan ./cy 

## 2020-11-16 DIAGNOSIS — M1710 Unilateral primary osteoarthritis, unspecified knee: Secondary | ICD-10-CM | POA: Diagnosis not present

## 2020-11-16 DIAGNOSIS — I10 Essential (primary) hypertension: Secondary | ICD-10-CM | POA: Diagnosis not present

## 2020-11-16 DIAGNOSIS — M1610 Unilateral primary osteoarthritis, unspecified hip: Secondary | ICD-10-CM | POA: Diagnosis not present

## 2020-11-16 DIAGNOSIS — N183 Chronic kidney disease, stage 3 unspecified: Secondary | ICD-10-CM | POA: Diagnosis not present

## 2020-11-16 DIAGNOSIS — K219 Gastro-esophageal reflux disease without esophagitis: Secondary | ICD-10-CM | POA: Diagnosis not present

## 2020-11-16 DIAGNOSIS — E782 Mixed hyperlipidemia: Secondary | ICD-10-CM | POA: Diagnosis not present

## 2020-11-16 DIAGNOSIS — I4891 Unspecified atrial fibrillation: Secondary | ICD-10-CM | POA: Diagnosis not present

## 2020-11-22 ENCOUNTER — Inpatient Hospital Stay: Payer: PPO | Attending: Oncology

## 2020-11-22 ENCOUNTER — Other Ambulatory Visit: Payer: Self-pay

## 2020-11-22 VITALS — BP 105/59 | HR 57 | Temp 99.2°F | Resp 18

## 2020-11-22 DIAGNOSIS — C50421 Malignant neoplasm of upper-outer quadrant of right male breast: Secondary | ICD-10-CM | POA: Insufficient documentation

## 2020-11-22 DIAGNOSIS — Z452 Encounter for adjustment and management of vascular access device: Secondary | ICD-10-CM | POA: Diagnosis not present

## 2020-11-22 DIAGNOSIS — Z95828 Presence of other vascular implants and grafts: Secondary | ICD-10-CM

## 2020-11-22 DIAGNOSIS — Z170 Estrogen receptor positive status [ER+]: Secondary | ICD-10-CM | POA: Diagnosis not present

## 2020-11-22 MED ORDER — SODIUM CHLORIDE 0.9% FLUSH
10.0000 mL | Freq: Once | INTRAVENOUS | Status: AC
  Administered 2020-11-22: 10 mL
  Filled 2020-11-22: qty 10

## 2020-11-22 MED ORDER — HEPARIN SOD (PORK) LOCK FLUSH 100 UNIT/ML IV SOLN
500.0000 [IU] | Freq: Once | INTRAVENOUS | Status: AC
  Administered 2020-11-22: 500 [IU]
  Filled 2020-11-22: qty 5

## 2020-12-19 NOTE — Progress Notes (Signed)
[place]  [phone] [phone]     ID: Logan Wright DOB: 1946/04/02  MR#: [id number]  EGB#:[id number]  Patient Care Team: [name], MD as PCP - General (Family Medicine) [name], [name], MD as PCP - Cardiology (Cardiology) Gwenda Heiner, [name], MD as Consulting Physician (Oncology) [name], MD as Consulting Physician (General Surgery) Kyung Rudd, MD as Consulting Physician (Radiation Oncology) Lavonna Monarch, MD as Consulting Physician (Dermatology) Paralee Cancel, MD as Consulting Physician (Orthopedic Surgery) Mauro Kaufmann, RN as Oncology Nurse Navigator Rockwell Germany, RN as Oncology Nurse Navigator Chauncey Cruel, MD OTHER MD:  CHIEF COMPLAINT: estrogen receptor positive breast cancer; BRCA1+  CURRENT TREATMENT: tamoxifen   INTERVAL HISTORY: Avraham returns today for follow up and treatment of his estrogen receptor positive breast cancer.   He completed adjuvant chemotherapy with CMF on 10/11/2020.  He tolerated treatment remarkably well.  He had a little bit of hair thinning but no hair loss of consequence.  He had no other significant side effects from the treatments.  He started tamoxifen on 10/29/2020 Gwynne Edinger Day).  So far he is tolerating it with no side effects that he is aware of.  Recall that he is on Eliquis/apixaban for his atrial fibrillation so the slight increase in clotting risk should not be an issue for him.   REVIEW OF SYSTEMS: [name] tells me he has lost about 40 pounds since his diagnosis of cancer simply by changing his diet.  He also exercises regularly, walking about an hour a day and then doing about a half an hour on the treadmill.  He would like to have his port removed.  A detailed review of systems today was otherwise noncontributory   COVID 19 VACCINATION STATUS: fully vaccinated, with booster September 2021   HISTORY OF CURRENT ILLNESS: From the original intake  note:  ZAREK RELPH palpated a right breast mass sometime late 2020.  He had a prior left breast mass in the 1990s which was found to be a fat deposit and he assumed this would be the same.  He did mention it to Dr. Jacelyn Grip at the time of their regular physical and he set [name] up for bilateral diagnostic mammography with tomography and right breast ultrasonography at [facility] on 02/21/2020 showing: breast density category B; 3.2 cm irregular mass at 11 o'clock in the right breast; normal right axillary lymph nodes.  Accordingly on 03/02/2020 the patient proceeded to biopsy of the right breast area in question. The pathology from this procedure ([id number]) showed: invasive ductal carcinoma, grade 2-3. Prognostic indicators significant for: estrogen receptor, 90% positive with strong staining intensity and progesterone receptor, 5% positive with moderate staining intensity. Proliferation marker Ki67 at 15%. HER2 equivocal by immunohistochemistry (2+), but negative by fluorescent in situ hybridization with a signals ratio 1.35 and number per cell 3.1.  He met with Dr. Lisbeth Renshaw on 03/16/2020 to discuss adjuvant radiation therapy. Per consultation note,  postmastectomy radiation was felt to be unlikely.  He proceeded to right mastectomy on 03/16/2020 under Dr. [name]. Pathology from the procedure [id number]) showed: invasive ductal carcinoma, grade 2, 3.2 cm; margins not involved.  All three sentinel lymph nodes removed were negative for carcinoma (0/3).  The patient's subsequent history is as detailed below.   PAST MEDICAL HISTORY: Past Medical History:  Diagnosis Date  . Ascending aorta dilatation (HCC)   . Breast cancer, right ([place])   . CKD (chronic kidney disease), stage III ([place])   .  Colon adenoma 2015  . Coronary artery calcification seen on CT scan   . Diverticulosis    DENIES   . DJD (degenerative joint disease)   . ED (erectile dysfunction)   . Elevated blood pressure 2007-2008   . Esophageal reflux   . GERD (gastroesophageal reflux disease)   . Gout   . Gynecomastia    LEFT  . Hearing loss   . HTN (hypertension)   . Hypercholesteremia   . Non-cardiac chest pain   . OA (osteoarthritis)    LEFT SHOULDER  . Onychomycosis of foot with other complication   . PAF (paroxysmal atrial fibrillation) ([place])   . Pre-diabetes   . Seborrhea   . Severe frontal headaches    RESOLVED   . Vertigo    RESOLVED   . Wears glasses   . Wears hearing aid     PAST SURGICAL HISTORY: Past Surgical History:  Procedure Laterality Date  . COLONSCOPY     . HIP ARTHROPLASTY Bilateral    AT [place]   . IR CV LINE INJECTION  05/14/2020  . IR IMAGING GUIDED PORT INSERTION  05/17/2020  . IR REMOVAL TUN ACCESS W/ PORT W/O FL MOD SED  05/17/2020  . KNEE ARTHROSCOPY     UNSURE WHICH KNEE   . MASTECTOMY W/ SENTINEL NODE BIOPSY Right 03/16/2020   Procedure: RIGHT BREAST MASTECTOMY WITH SENTINEL LYMPH NODE BIOPSY;  Surgeon: [name], MD;  Location: [place];  Service: General;  Laterality: Right;  . PORTACATH PLACEMENT Left 05/08/2020   Procedure: INSERTION PORT-A-CATH WITH ULTRASOUND;  Surgeon: [name], MD;  Location: [place];  Service: General;  Laterality: Left;  LMA  . TOTAL KNEE ARTHROPLASTY Left 11/23/2018   Procedure: TOTAL KNEE ARTHROPLASTY;  Surgeon: Paralee Cancel, MD;  [facility];  Service: Orthopedics;  Laterality: Left;  70 mins    FAMILY HISTORY: Family History  Problem Relation Age of Onset  . Heart attack Mother   . Stroke Mother   . Heart failure Father        71  . Hypertension Neg Hx   The patient is of Ashkenazi background.  His father died at age 51 and his mother at age 57.  On the father's side, the paternal grandfather died young from unclear causes.  The paternal grandmother did not have cancer.  The patient's father was a single child.  On the maternal side the patient's maternal grandmother died young again from unclear  reasons but the paternal grandfather and the 3 maternal aunts were all died in old age with no cancer.  The patient also knows of no cancer in his cousins.  He himself has 1 brother with no history of cancer, no sisters.   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  Previously he used to on [employer] which she sold in 1999.  He is a IT sales professional.  His wife of 19+ years Ivin Booty is very active in the community.  Their daughter Lowella Curb his Engineer, building services for a [facility] psychiatric group.  She lives in [place].  Son Annie Main lives in [place] by himself.  Patient has 3 grandchildren.  He attends [employer]    ADVANCED DIRECTIVES: In the absence of any documentation to the contrary, the patient's spouse is his HCPOA.    HEALTH MAINTENANCE: Social History   Tobacco Use  . Smoking  status: Never Smoker  . Smokeless tobacco: Never Used  Vaping Use  . Vaping Use: Never used  Substance Use Topics  . Alcohol use: No    Alcohol/week: 0.0 standard drinks  . Drug use: No     Colonoscopy: [facility]  Bone density: -  PSA:   No Known Allergies  Current Outpatient Medications  Medication Sig Dispense Refill  . allopurinol (ZYLOPRIM) 100 MG tablet Take 200 mg by mouth daily.    Marland Kitchen apixaban (ELIQUIS) 5 MG TABS tablet Take 1 tablet (5 mg total) by mouth 2 (two) times daily. 180 tablet 1  . atorvastatin (LIPITOR) 40 MG tablet Take 40 mg by mouth at bedtime.     Marland Kitchen diltiazem (CARDIZEM CD) 120 MG 24 hr capsule Take 1 capsule (120 mg total) by mouth daily. 90 capsule 3  . GLUCOSAMINE-CHONDROITIN PO Take 2 tablets by mouth daily.    Marland Kitchen lidocaine-prilocaine (EMLA) cream Apply to affected area once 30 g 3  . LORazepam (ATIVAN) 0.5 MG tablet Take 1 tablet (0.5 mg total) by mouth at bedtime as needed (Nausea or vomiting). 20 tablet 0  . losartan-hydrochlorothiazide  (HYZAAR) 50-12.5 MG tablet Take 1 tablet by mouth daily. 30 tablet 5  . Multiple Vitamin (MULTIVITAMIN) tablet Take 1 tablet by mouth daily.     . Omega-3 Fatty Acids (FISH OIL ULTRA) 1400 MG CAPS Take 1,400-2,800 mg by mouth See admin instructions. Take 1400 mg in the morning and 2800 mg in the evening    . Potassium 99 MG TABS Take 1 tablet by mouth daily.    Marland Kitchen VASCEPA 1 g capsule Take 2 g by mouth 2 (two) times daily.     No current facility-administered medications for this visit.    OBJECTIVE: White man in no acute distress  Vitals:   12/20/20 1312  BP: (!) 119/56  Pulse: (!) 57  Resp: 18  Temp: 97.7 F (36.5 C)  SpO2: 98%     Body mass index is 30.15 kg/m.   Wt Readings from Last 3 Encounters:  12/20/20 192 lb 8 oz (87.3 kg)  11/08/20 197 lb 9.6 oz (89.6 kg)  10/11/20 204 lb 8 oz (92.8 kg)      ECOG FS:1 - Symptomatic but completely ambulatory  Sclerae unicteric, EOMs intact Wearing a mask No cervical or supraclavicular adenopathy Lungs no rales or rhonchi Heart regular rate and rhythm Abd soft, nontender, positive bowel sounds MSK no focal spinal tenderness, no upper extremity lymphedema Neuro: nonfocal, well oriented, appropriate affect Breasts: The right breast is status post mastectomy with no evidence of local recurrence.  The left breast is benign.   LAB RESULTS:  CMP     Component Value Date/Time   NA 137 12/20/2020 1302   NA 142 08/09/2020 1153   K 4.1 12/20/2020 1302   CL 106 12/20/2020 1302   CO2 25 12/20/2020 1302   GLUCOSE 100 (H) 12/20/2020 1302   BUN 28 (H) 12/20/2020 1302   BUN 19 08/09/2020 1153   CREATININE 1.22 12/20/2020 1302   CREATININE 1.47 (H) 08/24/2015 1257   CALCIUM 8.9 12/20/2020 1302   PROT 6.4 (L) 12/20/2020 1302   PROT 6.7 08/09/2020 1153   ALBUMIN 3.5 12/20/2020 1302   ALBUMIN 4.3 08/09/2020 1153   AST 36 12/20/2020 1302   ALT 47 (H) 12/20/2020 1302   ALKPHOS 59 12/20/2020 1302   BILITOT 0.6 12/20/2020 1302   BILITOT  0.4 08/09/2020 1153   GFRNONAA >60 12/20/2020 1302   GFRAA 54 (  L) 08/09/2020 1153    No results found for: TOTALPROTELP, ALBUMINELP, A1GS, A2GS, BETS, BETA2SER, GAMS, MSPIKE, SPEI  Lab Results  Component Value Date   WBC 7.1 12/20/2020   NEUTROABS 5.0 12/20/2020   HGB 12.6 (L) 12/20/2020   HCT 38.2 (L) 12/20/2020   MCV 84.9 12/20/2020   PLT 233 12/20/2020    No results found for: LABCA2  No components found for: KJZPHX505  No results for input(s): INR in the last 168 hours.  No results found for: LABCA2  No results found for: WPV948  No results found for: AXK553  No results found for: ZSM270  No results found for: CA2729  No components found for: HGQUANT  No results found for: CEA1 / No results found for: CEA1   No results found for: AFPTUMOR  No results found for: CHROMOGRNA  No results found for: KPAFRELGTCHN, LAMBDASER, KAPLAMBRATIO (kappa/lambda light chains)  No results found for: HGBA, HGBA2QUANT, HGBFQUANT, HGBSQUAN (Hemoglobinopathy evaluation)   No results found for: LDH  No results found for: IRON, TIBC, IRONPCTSAT (Iron and TIBC)  No results found for: FERRITIN  Urinalysis No results found for: COLORURINE, APPEARANCEUR, LABSPEC, PHURINE, GLUCOSEU, HGBUR, BILIRUBINUR, KETONESUR, PROTEINUR, UROBILINOGEN, NITRITE, LEUKOCYTESUR   STUDIES: No results found.   ELIGIBLE FOR AVAILABLE RESEARCH PROTOCOL: no  ASSESSMENT: 75 y.o. [place] man status post right mastectomy 03/16/2020 for a pT2 pN0, stage IIA invasive ductal carcinoma, grade 2, with negative margins; estrogen receptor strongly positive, progesterone receptor moderately positive, HER-2 negative by FISH, MIB-1 of 15%  (a) a total of 3 sentinel lymph nodes were removed  (1) Oncotype score of 40 predicts a risk of recurrence outside the breast within the next 9 years of 28% if the patient's only systemic therapy is antiestrogens for 5 years.  It also predicts a significant benefit from  adjuvant chemotherapy.   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 STK11, TP53, TSC1, TSC2, and VHL.  The following genes were evaluated for sequence changes only: SDHA and HOXB13 c.251G>A variant only.   (4) tamoxifen started 10/30/2019   PLAN: Marx tolerated his chemotherapy remarkably well and also is tolerating tamoxifen without any significant side effects.  The plan will be to continue that a total of 5 years.  Of course given his BRCA mutation he is at risk of developing breast cancer in the remaining breast and will need to be evaluated for that with physical exam for the rest of his life.  He is also at risk of prostate cancer.  I will add a PSA to his next set of labs.  I commended his excellent diet and exercise program.  He certainly looks healthier now than he did before his diagnosis of breast cancer  He requests to have his port removed and I have alerted his surgeon Dr. [name] regarding that.  I will see him again in September, with lab work and physical exam.  From that point I  think we can start seeing him on a once a year basis  Total encounter time 25 minutes.*      Total encounter time 25 minutes.* Total encounter time today 20 minutes.Sarajane Jews C. Mariane Burpee, MD  12/20/20 4:53 PM Medical Oncology and Hematology [facility] [place], [place] [address] Tel. [phone]    Fax. [phone]   I, Wilburn Mylar, am acting as scribe for Dr. [name]. D'Arcy Abraha.  I, Lurline Del MD, have reviewed the above documentation for accuracy and completeness, and I agree with the above.   *Total Encounter Time as defined by the Centers for Medicare and Medicaid Services includes, in addition to the face-to-face time of a patient visit (documented in the note above) non-face-to-face time: obtaining and reviewing outside history, ordering and reviewing medications, tests or procedures, care coordination (communications with other health care professionals or caregivers) and documentation in the medical record.

## 2020-12-20 ENCOUNTER — Telehealth: Payer: Self-pay | Admitting: Oncology

## 2020-12-20 ENCOUNTER — Other Ambulatory Visit: Payer: Self-pay

## 2020-12-20 ENCOUNTER — Inpatient Hospital Stay: Payer: PPO

## 2020-12-20 ENCOUNTER — Other Ambulatory Visit: Payer: Self-pay | Admitting: Surgery

## 2020-12-20 ENCOUNTER — Inpatient Hospital Stay: Payer: PPO | Attending: Oncology | Admitting: Oncology

## 2020-12-20 VITALS — BP 119/56 | HR 57 | Temp 97.7°F | Resp 18 | Ht 67.0 in | Wt 192.5 lb

## 2020-12-20 DIAGNOSIS — Z95828 Presence of other vascular implants and grafts: Secondary | ICD-10-CM

## 2020-12-20 DIAGNOSIS — C50421 Malignant neoplasm of upper-outer quadrant of right male breast: Secondary | ICD-10-CM | POA: Diagnosis not present

## 2020-12-20 DIAGNOSIS — Z170 Estrogen receptor positive status [ER+]: Secondary | ICD-10-CM | POA: Diagnosis not present

## 2020-12-20 DIAGNOSIS — Z79810 Long term (current) use of selective estrogen receptor modulators (SERMs): Secondary | ICD-10-CM | POA: Insufficient documentation

## 2020-12-20 LAB — COMPREHENSIVE METABOLIC PANEL
ALT: 47 U/L — ABNORMAL HIGH (ref 0–44)
AST: 36 U/L (ref 15–41)
Albumin: 3.5 g/dL (ref 3.5–5.0)
Alkaline Phosphatase: 59 U/L (ref 38–126)
Anion gap: 6 (ref 5–15)
BUN: 28 mg/dL — ABNORMAL HIGH (ref 8–23)
CO2: 25 mmol/L (ref 22–32)
Calcium: 8.9 mg/dL (ref 8.9–10.3)
Chloride: 106 mmol/L (ref 98–111)
Creatinine, Ser: 1.22 mg/dL (ref 0.61–1.24)
GFR, Estimated: >60 mL/min (ref >60)
Glucose, Bld: 100 mg/dL — ABNORMAL HIGH (ref 70–99)
Potassium: 4.1 mmol/L (ref 3.5–5.1)
Sodium: 137 mmol/L (ref 135–145)
Total Bilirubin: 0.6 mg/dL (ref 0.3–1.2)
Total Protein: 6.4 g/dL — ABNORMAL LOW (ref 6.5–8.1)

## 2020-12-20 LAB — CBC WITH DIFFERENTIAL/PLATELET
Abs Immature Granulocytes: 0.01 10*3/uL (ref 0.00–0.07)
Basophils Absolute: 0 10*3/uL (ref 0.0–0.1)
Basophils Relative: 0 %
Eosinophils Absolute: 0.1 10*3/uL (ref 0.0–0.5)
Eosinophils Relative: 1 %
HCT: 38.2 % — ABNORMAL LOW (ref 39.0–52.0)
Hemoglobin: 12.6 g/dL — ABNORMAL LOW (ref 13.0–17.0)
Immature Granulocytes: 0 %
Lymphocytes Relative: 17 %
Lymphs Abs: 1.2 10*3/uL (ref 0.7–4.0)
MCH: 28 pg (ref 26.0–34.0)
MCHC: 33 g/dL (ref 30.0–36.0)
MCV: 84.9 fL (ref 80.0–100.0)
Monocytes Absolute: 0.8 10*3/uL (ref 0.1–1.0)
Monocytes Relative: 12 %
Neutro Abs: 5 10*3/uL (ref 1.7–7.7)
Neutrophils Relative %: 70 %
Platelets: 233 10*3/uL (ref 150–400)
RBC: 4.5 MIL/uL (ref 4.22–5.81)
RDW: 15 % (ref 11.5–15.5)
WBC: 7.1 10*3/uL (ref 4.0–10.5)
nRBC: 0 % (ref 0.0–0.2)

## 2020-12-20 MED ORDER — SODIUM CHLORIDE 0.9% FLUSH
10.0000 mL | Freq: Once | INTRAVENOUS | Status: AC
  Administered 2020-12-20: 10 mL
  Filled 2020-12-20: qty 10

## 2020-12-20 MED ORDER — HEPARIN SOD (PORK) LOCK FLUSH 100 UNIT/ML IV SOLN
500.0000 [IU] | Freq: Once | INTRAVENOUS | Status: AC
Start: 2020-12-20 — End: 2020-12-20
  Administered 2020-12-20: 500 [IU]
  Filled 2020-12-20: qty 5

## 2020-12-20 NOTE — Patient Instructions (Signed)
Implanted Port Insertion, Care After This sheet gives you information about how to care for yourself after your procedure. Your health care provider may also give you more specific instructions. If you have problems or questions, contact your health care provider. What can I expect after the procedure? After the procedure, it is common to have:  Discomfort at the port insertion site.  Bruising on the skin over the port. This should improve over 3-4 days. Follow these instructions at home: Port care  After your port is placed, you will get a manufacturer's information card. The card has information about your port. Keep this card with you at all times.  Take care of the port as told by your health care provider. Ask your health care provider if you or a family member can get training for taking care of the port at home. A home health care nurse may also take care of the port.  Make sure to remember what type of port you have. Incision care  Follow instructions from your health care provider about how to take care of your port insertion site. Make sure you: ? Wash your hands with soap and water before and after you change your bandage (dressing). If soap and water are not available, use hand sanitizer. ? Change your dressing as told by your health care provider. ? Leave stitches (sutures), skin glue, or adhesive strips in place. These skin closures may need to stay in place for 2 weeks or longer. If adhesive strip edges start to loosen and curl up, you may trim the loose edges. Do not remove adhesive strips completely unless your health care provider tells you to do that.  Check your port insertion site every day for signs of infection. Check for: ? Redness, swelling, or pain. ? Fluid or blood. ? Warmth. ? Pus or a bad smell.      Activity  Return to your normal activities as told by your health care provider. Ask your health care provider what activities are safe for you.  Do not  lift anything that is heavier than 10 lb (4.5 kg), or the limit that you are told, until your health care provider says that it is safe. General instructions  Take over-the-counter and prescription medicines only as told by your health care provider.  Do not take baths, swim, or use a hot tub until your health care provider approves. Ask your health care provider if you may take showers. You may only be allowed to take sponge baths.  Do not drive for 24 hours if you were given a sedative during your procedure.  Wear a medical alert bracelet in case of an emergency. This will tell any health care providers that you have a port.  Keep all follow-up visits as told by your health care provider. This is important. Contact a health care provider if:  You cannot flush your port with saline as directed, or you cannot draw blood from the port.  You have a fever or chills.  You have redness, swelling, or pain around your port insertion site.  You have fluid or blood coming from your port insertion site.  Your port insertion site feels warm to the touch.  You have pus or a bad smell coming from the port insertion site. Get help right away if:  You have chest pain or shortness of breath.  You have bleeding from your port that you cannot control. Summary  Take care of the port as told by your   health care provider. Keep the manufacturer's information card with you at all times.  Change your dressing as told by your health care provider.  Contact a health care provider if you have a fever or chills or if you have redness, swelling, or pain around your port insertion site.  Keep all follow-up visits as told by your health care provider. This information is not intended to replace advice given to you by your health care provider. Make sure you discuss any questions you have with your health care provider. Document Revised: 04/27/2018 Document Reviewed: 04/27/2018 Elsevier Patient Education   2021 Elsevier Inc.  

## 2020-12-20 NOTE — Telephone Encounter (Signed)
Scheduled appts per 3/10 los. Pt stated he would refer to mychart for AVS and appts.

## 2020-12-26 ENCOUNTER — Other Ambulatory Visit: Payer: Self-pay

## 2020-12-26 NOTE — Progress Notes (Signed)
Losartan-HCTZ removed from medication list per Dr. Acie Fredrickson due to patient not taking at this time.

## 2020-12-27 ENCOUNTER — Encounter: Payer: Self-pay | Admitting: Oncology

## 2021-01-07 DIAGNOSIS — M1610 Unilateral primary osteoarthritis, unspecified hip: Secondary | ICD-10-CM | POA: Diagnosis not present

## 2021-01-07 DIAGNOSIS — I10 Essential (primary) hypertension: Secondary | ICD-10-CM | POA: Diagnosis not present

## 2021-01-07 DIAGNOSIS — M1710 Unilateral primary osteoarthritis, unspecified knee: Secondary | ICD-10-CM | POA: Diagnosis not present

## 2021-01-07 DIAGNOSIS — I4891 Unspecified atrial fibrillation: Secondary | ICD-10-CM | POA: Diagnosis not present

## 2021-01-07 DIAGNOSIS — E782 Mixed hyperlipidemia: Secondary | ICD-10-CM | POA: Diagnosis not present

## 2021-01-07 DIAGNOSIS — K219 Gastro-esophageal reflux disease without esophagitis: Secondary | ICD-10-CM | POA: Diagnosis not present

## 2021-01-07 DIAGNOSIS — N183 Chronic kidney disease, stage 3 unspecified: Secondary | ICD-10-CM | POA: Diagnosis not present

## 2021-01-08 DIAGNOSIS — Z452 Encounter for adjustment and management of vascular access device: Secondary | ICD-10-CM | POA: Diagnosis not present

## 2021-01-08 DIAGNOSIS — Z853 Personal history of malignant neoplasm of breast: Secondary | ICD-10-CM | POA: Diagnosis not present

## 2021-01-30 ENCOUNTER — Ambulatory Visit: Payer: PPO | Admitting: Dermatology

## 2021-01-30 ENCOUNTER — Encounter: Payer: Self-pay | Admitting: Dermatology

## 2021-01-30 ENCOUNTER — Other Ambulatory Visit: Payer: Self-pay

## 2021-01-30 DIAGNOSIS — C44519 Basal cell carcinoma of skin of other part of trunk: Secondary | ICD-10-CM

## 2021-01-30 DIAGNOSIS — D485 Neoplasm of uncertain behavior of skin: Secondary | ICD-10-CM

## 2021-01-30 DIAGNOSIS — Z1283 Encounter for screening for malignant neoplasm of skin: Secondary | ICD-10-CM | POA: Diagnosis not present

## 2021-01-30 DIAGNOSIS — Z853 Personal history of malignant neoplasm of breast: Secondary | ICD-10-CM

## 2021-01-30 NOTE — Patient Instructions (Signed)

## 2021-02-01 DIAGNOSIS — K219 Gastro-esophageal reflux disease without esophagitis: Secondary | ICD-10-CM | POA: Diagnosis not present

## 2021-02-01 DIAGNOSIS — M109 Gout, unspecified: Secondary | ICD-10-CM | POA: Diagnosis not present

## 2021-02-01 DIAGNOSIS — M1710 Unilateral primary osteoarthritis, unspecified knee: Secondary | ICD-10-CM | POA: Diagnosis not present

## 2021-02-01 DIAGNOSIS — M1610 Unilateral primary osteoarthritis, unspecified hip: Secondary | ICD-10-CM | POA: Diagnosis not present

## 2021-02-01 DIAGNOSIS — I10 Essential (primary) hypertension: Secondary | ICD-10-CM | POA: Diagnosis not present

## 2021-02-01 DIAGNOSIS — C50921 Malignant neoplasm of unspecified site of right male breast: Secondary | ICD-10-CM | POA: Diagnosis not present

## 2021-02-01 DIAGNOSIS — N183 Chronic kidney disease, stage 3 unspecified: Secondary | ICD-10-CM | POA: Diagnosis not present

## 2021-02-01 DIAGNOSIS — E782 Mixed hyperlipidemia: Secondary | ICD-10-CM | POA: Diagnosis not present

## 2021-02-01 DIAGNOSIS — I4891 Unspecified atrial fibrillation: Secondary | ICD-10-CM | POA: Diagnosis not present

## 2021-02-06 ENCOUNTER — Telehealth: Payer: Self-pay | Admitting: *Deleted

## 2021-02-06 NOTE — Telephone Encounter (Signed)
-----   Message from Lavonna Monarch, MD sent at 02/06/2021  1:32 PM EDT ----- Schedule surgery with Dr. Darene Lamer

## 2021-02-06 NOTE — Telephone Encounter (Signed)
Pathology to patient-surgery appointment scheduled.  

## 2021-02-06 NOTE — Telephone Encounter (Signed)
Left patient message to call us back for pathology report.

## 2021-02-09 ENCOUNTER — Encounter: Payer: Self-pay | Admitting: Dermatology

## 2021-02-09 NOTE — Progress Notes (Signed)
   Follow-Up Visit   Subjective  Logan Wright is a 75 y.o. male who presents for the following: Annual Exam (Here for full body skin examination- no new concerns).  General skin examination.  I complemented Mr. Mervyn Skeeters on his lifestyle changes with weight loss and increased fitness since his diagnosis of breast cancer. Location:  Duration:  Quality:  Associated Signs/Symptoms: Modifying Factors:  Severity:  Timing: Context:   Objective  Well appearing patient in no apparent distress; mood and affect are within normal limits. Objective  Mid Back: Pearly 8mm flat papule compatible with superficial BCC       Objective  Mid Back: General skin examination, no atypical pigmented lesions.  1 possible superficial BCC mid back will be biopsied.    A full examination was performed including scalp, head, eyes, ears, nose, lips, neck, chest, axillae, abdomen, back, buttocks, bilateral upper extremities, bilateral lower extremities, hands, feet, fingers, toes, fingernails, and toenails. All findings within normal limits unless otherwise noted below.   Assessment & Plan    Neoplasm of uncertain behavior of skin Mid Back  Skin / nail biopsy Type of biopsy: tangential   Informed consent: discussed and consent obtained   Timeout: patient name, date of birth, surgical site, and procedure verified   Procedure prep:  Patient was prepped and draped in usual sterile fashion (Non sterile) Prep type:  Chlorhexidine Anesthesia: the lesion was anesthetized in a standard fashion   Anesthetic:  1% lidocaine w/ epinephrine 1-100,000 local infiltration Instrument used: flexible razor blade   Outcome: patient tolerated procedure well   Post-procedure details: wound care instructions given    Specimen 1 - Surgical pathology Differential Diagnosis: BCC SCC  Check Margins: No  Encounter for screening for malignant neoplasm of skin Mid Back  Twice annual self examination with spouse.   Annual dermatology examination.      I, Lavonna Monarch, MD, have reviewed all documentation for this visit.  The documentation on 02/09/21 for the exam, diagnosis, procedures, and orders are all accurate and complete.

## 2021-02-14 ENCOUNTER — Inpatient Hospital Stay: Payer: PPO | Attending: Oncology

## 2021-02-15 ENCOUNTER — Other Ambulatory Visit: Payer: Self-pay | Admitting: Cardiovascular Disease

## 2021-02-15 DIAGNOSIS — I480 Paroxysmal atrial fibrillation: Secondary | ICD-10-CM

## 2021-02-15 NOTE — Telephone Encounter (Signed)
Pt last saw Dr Acie Fredrickson 11/08/20, last labs 12/20/20 Creat 1.22, age 75, weight 87.3kg, based on specified criteria pt is on appropriate dosage of Eliquis 5mg  BID.  Will refill rx.

## 2021-02-18 DIAGNOSIS — R748 Abnormal levels of other serum enzymes: Secondary | ICD-10-CM | POA: Diagnosis not present

## 2021-02-20 DIAGNOSIS — R7401 Elevation of levels of liver transaminase levels: Secondary | ICD-10-CM | POA: Diagnosis not present

## 2021-03-08 DIAGNOSIS — I4891 Unspecified atrial fibrillation: Secondary | ICD-10-CM | POA: Diagnosis not present

## 2021-03-08 DIAGNOSIS — I10 Essential (primary) hypertension: Secondary | ICD-10-CM | POA: Diagnosis not present

## 2021-03-08 DIAGNOSIS — N183 Chronic kidney disease, stage 3 unspecified: Secondary | ICD-10-CM | POA: Diagnosis not present

## 2021-03-08 DIAGNOSIS — M1710 Unilateral primary osteoarthritis, unspecified knee: Secondary | ICD-10-CM | POA: Diagnosis not present

## 2021-03-08 DIAGNOSIS — E782 Mixed hyperlipidemia: Secondary | ICD-10-CM | POA: Diagnosis not present

## 2021-03-08 DIAGNOSIS — M1610 Unilateral primary osteoarthritis, unspecified hip: Secondary | ICD-10-CM | POA: Diagnosis not present

## 2021-03-08 DIAGNOSIS — K219 Gastro-esophageal reflux disease without esophagitis: Secondary | ICD-10-CM | POA: Diagnosis not present

## 2021-03-20 DIAGNOSIS — C50921 Malignant neoplasm of unspecified site of right male breast: Secondary | ICD-10-CM | POA: Diagnosis not present

## 2021-03-25 ENCOUNTER — Ambulatory Visit (INDEPENDENT_AMBULATORY_CARE_PROVIDER_SITE_OTHER): Payer: PPO | Admitting: Dermatology

## 2021-03-25 ENCOUNTER — Other Ambulatory Visit: Payer: Self-pay

## 2021-03-25 DIAGNOSIS — C4491 Basal cell carcinoma of skin, unspecified: Secondary | ICD-10-CM

## 2021-03-25 DIAGNOSIS — C44519 Basal cell carcinoma of skin of other part of trunk: Secondary | ICD-10-CM | POA: Diagnosis not present

## 2021-03-25 NOTE — Patient Instructions (Signed)

## 2021-03-27 ENCOUNTER — Encounter: Payer: Self-pay | Admitting: Dermatology

## 2021-03-27 NOTE — Progress Notes (Signed)
   Follow-Up Visit   Subjective  Logan Wright is a 75 y.o. male who presents for the following: Procedure (Treatment mid back superficial BCC.).  BCC Location: Back Duration:  Quality:  Associated Signs/Symptoms: Modifying Factors:  Severity:  Timing: Context: For treatment  Objective  Well appearing patient in no apparent distress; mood and affect are within normal limits. Mid Back Lesion identified by nurse and Dr.Kymoni Monday in room.     A focused examination was performed including torso. Relevant physical exam findings are noted in the Assessment and Plan.   Assessment & Plan    Superficial basal cell carcinoma Mid Back  Destruction of lesion Complexity: simple   Destruction method: electrodesiccation and curettage   Informed consent: discussed and consent obtained   Timeout:  patient name, date of birth, surgical site, and procedure verified Anesthesia: the lesion was anesthetized in a standard fashion   Anesthetic:  1% lidocaine w/ epinephrine 1-100,000 local infiltration Curettage performed in three different directions: Yes   Curettage cycles:  3 Lesion length (cm):  1.4 Lesion width (cm):  1.4 Margin per side (cm):  0 Final wound size (cm):  1.4 Hemostasis achieved with:  ferric subsulfate Outcome: patient tolerated procedure well with no complications   Post-procedure details: wound care instructions given   Additional details:  Wound innoculated with 5 fluorouracil solution.      I, Lavonna Monarch, MD, have reviewed all documentation for this visit.  The documentation on 03/27/21 for the exam, diagnosis, procedures, and orders are all accurate and complete.

## 2021-04-25 DIAGNOSIS — E663 Overweight: Secondary | ICD-10-CM | POA: Diagnosis not present

## 2021-04-25 DIAGNOSIS — M7662 Achilles tendinitis, left leg: Secondary | ICD-10-CM | POA: Diagnosis not present

## 2021-04-25 DIAGNOSIS — I4891 Unspecified atrial fibrillation: Secondary | ICD-10-CM | POA: Diagnosis not present

## 2021-04-25 DIAGNOSIS — R7401 Elevation of levels of liver transaminase levels: Secondary | ICD-10-CM | POA: Diagnosis not present

## 2021-05-01 DIAGNOSIS — I10 Essential (primary) hypertension: Secondary | ICD-10-CM | POA: Diagnosis not present

## 2021-05-01 DIAGNOSIS — N183 Chronic kidney disease, stage 3 unspecified: Secondary | ICD-10-CM | POA: Diagnosis not present

## 2021-05-01 DIAGNOSIS — E782 Mixed hyperlipidemia: Secondary | ICD-10-CM | POA: Diagnosis not present

## 2021-05-01 DIAGNOSIS — M1710 Unilateral primary osteoarthritis, unspecified knee: Secondary | ICD-10-CM | POA: Diagnosis not present

## 2021-05-01 DIAGNOSIS — C50921 Malignant neoplasm of unspecified site of right male breast: Secondary | ICD-10-CM | POA: Diagnosis not present

## 2021-05-01 DIAGNOSIS — K219 Gastro-esophageal reflux disease without esophagitis: Secondary | ICD-10-CM | POA: Diagnosis not present

## 2021-05-01 DIAGNOSIS — I4891 Unspecified atrial fibrillation: Secondary | ICD-10-CM | POA: Diagnosis not present

## 2021-05-01 DIAGNOSIS — M1610 Unilateral primary osteoarthritis, unspecified hip: Secondary | ICD-10-CM | POA: Diagnosis not present

## 2021-05-29 DIAGNOSIS — Z7189 Other specified counseling: Secondary | ICD-10-CM | POA: Diagnosis not present

## 2021-05-29 DIAGNOSIS — M79676 Pain in unspecified toe(s): Secondary | ICD-10-CM | POA: Diagnosis not present

## 2021-07-09 ENCOUNTER — Inpatient Hospital Stay: Payer: PPO

## 2021-07-09 ENCOUNTER — Other Ambulatory Visit: Payer: Self-pay

## 2021-07-09 ENCOUNTER — Inpatient Hospital Stay: Payer: PPO | Attending: Oncology | Admitting: Oncology

## 2021-07-09 VITALS — BP 123/68 | HR 55 | Temp 97.7°F | Wt 167.6 lb

## 2021-07-09 DIAGNOSIS — Z1501 Genetic susceptibility to malignant neoplasm of breast: Secondary | ICD-10-CM | POA: Insufficient documentation

## 2021-07-09 DIAGNOSIS — Z79899 Other long term (current) drug therapy: Secondary | ICD-10-CM | POA: Diagnosis not present

## 2021-07-09 DIAGNOSIS — Z7901 Long term (current) use of anticoagulants: Secondary | ICD-10-CM | POA: Insufficient documentation

## 2021-07-09 DIAGNOSIS — C50421 Malignant neoplasm of upper-outer quadrant of right male breast: Secondary | ICD-10-CM | POA: Diagnosis not present

## 2021-07-09 DIAGNOSIS — Z170 Estrogen receptor positive status [ER+]: Secondary | ICD-10-CM

## 2021-07-09 DIAGNOSIS — Z79810 Long term (current) use of selective estrogen receptor modulators (SERMs): Secondary | ICD-10-CM | POA: Diagnosis not present

## 2021-07-09 LAB — CBC WITH DIFFERENTIAL/PLATELET
Abs Immature Granulocytes: 0.02 10*3/uL (ref 0.00–0.07)
Basophils Absolute: 0 10*3/uL (ref 0.0–0.1)
Basophils Relative: 1 %
Eosinophils Absolute: 0.1 10*3/uL (ref 0.0–0.5)
Eosinophils Relative: 2 %
HCT: 42.6 % (ref 39.0–52.0)
Hemoglobin: 14 g/dL (ref 13.0–17.0)
Immature Granulocytes: 0 %
Lymphocytes Relative: 26 %
Lymphs Abs: 1.9 10*3/uL (ref 0.7–4.0)
MCH: 28.4 pg (ref 26.0–34.0)
MCHC: 32.9 g/dL (ref 30.0–36.0)
MCV: 86.4 fL (ref 80.0–100.0)
Monocytes Absolute: 0.8 10*3/uL (ref 0.1–1.0)
Monocytes Relative: 11 %
Neutro Abs: 4.4 10*3/uL (ref 1.7–7.7)
Neutrophils Relative %: 60 %
Platelets: 177 10*3/uL (ref 150–400)
RBC: 4.93 MIL/uL (ref 4.22–5.81)
RDW: 13.7 % (ref 11.5–15.5)
WBC: 7.3 10*3/uL (ref 4.0–10.5)
nRBC: 0 % (ref 0.0–0.2)

## 2021-07-09 LAB — COMPREHENSIVE METABOLIC PANEL
ALT: 37 U/L (ref 0–44)
AST: 29 U/L (ref 15–41)
Albumin: 3.4 g/dL — ABNORMAL LOW (ref 3.5–5.0)
Alkaline Phosphatase: 92 U/L (ref 38–126)
Anion gap: 8 (ref 5–15)
BUN: 23 mg/dL (ref 8–23)
CO2: 26 mmol/L (ref 22–32)
Calcium: 9.1 mg/dL (ref 8.9–10.3)
Chloride: 108 mmol/L (ref 98–111)
Creatinine, Ser: 1.35 mg/dL — ABNORMAL HIGH (ref 0.61–1.24)
GFR, Estimated: 54 mL/min — ABNORMAL LOW (ref >60)
Glucose, Bld: 89 mg/dL (ref 70–99)
Potassium: 4.6 mmol/L (ref 3.5–5.1)
Sodium: 142 mmol/L (ref 135–145)
Total Bilirubin: 0.5 mg/dL (ref 0.3–1.2)
Total Protein: 6.2 g/dL — ABNORMAL LOW (ref 6.5–8.1)

## 2021-07-09 NOTE — Progress Notes (Signed)
[facility]  [phone] [phone]     ID: Logan Wright DOB: [birth year]  MR#: [id number]  EZM#:[id number]  Patient Care Team: [name], MD as PCP - General (Family Medicine) [name], [name], MD as PCP - Cardiology (Cardiology) Cheveyo Virginia, [name], MD as Consulting Physician (Oncology) [name], MD as Consulting Physician (General Surgery) Kyung Rudd, MD as Consulting Physician (Radiation Oncology) Lavonna Monarch, MD as Consulting Physician (Dermatology) Paralee Cancel, MD as Consulting Physician (Orthopedic Surgery) Mauro Kaufmann, RN as Oncology Nurse Navigator Rockwell Germany, RN as Oncology Nurse Navigator Chauncey Cruel, MD OTHER MD:  CHIEF COMPLAINT: estrogen receptor positive breast cancer; BRCA1+  CURRENT TREATMENT: tamoxifen   INTERVAL HISTORY: Logan Wright returns today for follow up and treatment of his estrogen receptor positive breast cancer.   He started tamoxifen on 10/29/2020 Gwynne Edinger Day).  He is taking it with no side effects that he is aware of.  However he seemed surprisingly (to me) unaware of the purpose of tamoxifen and how it works.  We have discussed this previously and we reviewed it today.  Recall that he is on Eliquis/apixaban for his atrial fibrillation so the slight increase in clotting risk should not be an issue for him.   REVIEW OF SYSTEMS: Logan Wright has gone from 230 pounds to 167 pounds and his goal is to get a BMI of 25.  He walks every morning at least 30 minutes frequently 45 minutes and he also does aerobics and weight classes at the gym 5 days a week.  He has a much better diet than he did.  He feels much better overall.  He is not sure and recently went to [facility] and had a wonderful time very active time there.  Detailed review of systems today was otherwise stable   COVID 19 VACCINATION STATUS: fully vaccinated, with booster September 2021   HISTORY OF CURRENT  ILLNESS: From the original intake note:  ATWELL MCDANEL palpated a right breast mass sometime late 2020.  He had a prior left breast mass in the 1990s which was found to be a fat deposit and he assumed this would be the same.  He did mention it to Dr. Jacelyn Grip at the time of their regular physical and he set Logan Wright up for bilateral diagnostic mammography with tomography and right breast ultrasonography at [facility] on 02/21/2020 showing: breast density category B; 3.2 cm irregular mass at 11 o'clock in the right breast; normal right axillary lymph nodes.  Accordingly on 03/02/2020 the patient proceeded to biopsy of the right breast area in question. The pathology from this procedure ([id number]) showed: invasive ductal carcinoma, grade 2-3. Prognostic indicators significant for: estrogen receptor, 90% positive with strong staining intensity and progesterone receptor, 5% positive with moderate staining intensity. Proliferation marker Ki67 at 15%. HER2 equivocal by immunohistochemistry (2+), but negative by fluorescent in situ hybridization with a signals ratio 1.35 and number per cell 3.1.  He met with Dr. Lisbeth Renshaw on 03/16/2020 to discuss adjuvant radiation therapy. Per consultation note,  postmastectomy radiation was felt to be unlikely.  He proceeded to right mastectomy on 03/16/2020 under Dr. [name]. Pathology from the procedure [phone]) showed: invasive ductal carcinoma, grade 2, 3.2 cm; margins not involved.  All three sentinel lymph nodes removed were negative for carcinoma (0/3).  The patient's subsequent history is as detailed below.   PAST MEDICAL HISTORY: Past Medical History:  Diagnosis Date   Ascending aorta dilatation ([place])  Breast cancer, right ([place])    CKD (chronic kidney disease), stage III ([place])    Colon adenoma 2015   Coronary artery calcification seen on CT scan    Diverticulosis    DENIES    DJD (degenerative joint disease)    ED (erectile dysfunction)    Elevated  blood pressure 2007-2008   Esophageal reflux    GERD (gastroesophageal reflux disease)    Gout    Gynecomastia    LEFT   Hearing loss    HTN (hypertension)    Hypercholesteremia    Non-cardiac chest pain    OA (osteoarthritis)    LEFT SHOULDER   Onychomycosis of foot with other complication    PAF (paroxysmal atrial fibrillation) ([facility])    Pre-diabetes    Seborrhea    Severe frontal headaches    RESOLVED    Vertigo    RESOLVED    Wears glasses    Wears hearing aid     PAST SURGICAL HISTORY: Past Surgical History:  Procedure Laterality Date   COLONSCOPY      HIP ARTHROPLASTY Bilateral    AT [facility] [facility]    IR CV LINE INJECTION  05/14/2020   IR IMAGING GUIDED PORT INSERTION  05/17/2020   IR REMOVAL TUN ACCESS W/ PORT W/O FL MOD SED  05/17/2020   KNEE ARTHROSCOPY     UNSURE WHICH KNEE    MASTECTOMY W/ SENTINEL NODE BIOPSY Right 03/16/2020   Procedure: RIGHT BREAST MASTECTOMY WITH SENTINEL LYMPH NODE BIOPSY;  Surgeon: [name], MD;  Location: [facility];  Service: General;  Laterality: Right;   PORTACATH PLACEMENT Left 05/08/2020   Procedure: INSERTION PORT-A-CATH WITH ULTRASOUND;  Surgeon: [name], MD;  Location: [facility];  Service: General;  Laterality: Left;  LMA   TOTAL KNEE ARTHROPLASTY Left 11/23/2018   Procedure: TOTAL KNEE ARTHROPLASTY;  Surgeon: Paralee Cancel, MD;  Location: [facility];  Service: Orthopedics;  Laterality: Left;  70 mins    FAMILY HISTORY: Family History  Problem Relation Age of Onset   Heart attack Mother    Stroke Mother    Heart failure Father        27   Hypertension Neg Hx   The patient is of Ashkenazi background.  His father died at age 57 and his mother at age 19.  On the father's side, the paternal grandfather died young from unclear causes.  The paternal grandmother did not have cancer.  The patient's father was a single child.  On the maternal side the patient's maternal grandmother died young again from unclear reasons  but the paternal grandfather and the 3 maternal aunts were all died in old age with no cancer.  The patient also knows of no cancer in his cousins.  He himself has 1 brother with no history of cancer, no sisters.   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  He is a IT sales professional.  His wife of 95+ years Logan Wright is very active in the community.  Their daughter Logan Wright his Engineer, building services for a [facility] psychiatric group.  She lives in [place].  Son Logan Wright lives in [place] by himself.  Patient has 3 grandchildren.  He attends [employer]    ADVANCED DIRECTIVES: In the absence of any documentation to the contrary, the patient's spouse  is his HCPOA.    HEALTH MAINTENANCE: Social History   Tobacco Use   Smoking status: Never   Smokeless tobacco: Never  Vaping Use   Vaping Use: Never used  Substance Use Topics   Alcohol use: No    Alcohol/week: 0.0 standard drinks   Drug use: No     Colonoscopy: [facility]  Bone density: -  PSA:   No Known Allergies  Current Outpatient Medications  Medication Sig Dispense Refill   allopurinol (ZYLOPRIM) 100 MG tablet Take 200 mg by mouth daily.     atorvastatin (LIPITOR) 40 MG tablet Take 40 mg by mouth at bedtime.      diltiazem (CARDIZEM CD) 120 MG 24 hr capsule Take 1 capsule (120 mg total) by mouth daily. 90 capsule 3   ELIQUIS 5 MG TABS tablet Take 1 tablet by mouth twice daily 180 tablet 1   GLUCOSAMINE-CHONDROITIN PO Take 2 tablets by mouth daily.     Multiple Vitamin (MULTIVITAMIN) tablet Take 1 tablet by mouth daily.      Omega-3 Fatty Acids (FISH OIL ULTRA) 1400 MG CAPS Take 1,400-2,800 mg by mouth See admin instructions. Take 1400 mg in the morning and 2800 mg in the evening     Potassium 99 MG TABS Take 1 tablet by mouth daily.     No  current facility-administered medications for this visit.    OBJECTIVE: White man in no acute distress  Vitals:   07/09/21 1452  BP: 123/68  Pulse: (!) 55  Temp: 97.7 F (36.5 C)  SpO2: 100%      Body mass index is 26.25 kg/m.   Wt Readings from Last 3 Encounters:  07/09/21 167 lb 9.6 oz (76 kg)  12/20/20 192 lb 8 oz (87.3 kg)  11/08/20 197 lb 9.6 oz (89.6 kg)      ECOG FS:1 - Symptomatic but completely ambulatory  Sclerae unicteric, EOMs intact Wearing a mask No cervical or supraclavicular adenopathy Lungs no rales or rhonchi Heart regular rate and rhythm Abd soft, nontender, positive bowel sounds MSK no focal spinal tenderness, no right upper extremity lymphedema Neuro: nonfocal, well oriented, appropriate affect Breasts: The right breast is status post mastectomy with no evidence of chest wall recurrence.  The left breast is benign.  Both axillae are benign   LAB RESULTS:  CMP     Component Value Date/Time   NA 142 07/09/2021 1428   NA 142 08/09/2020 1153   K 4.6 07/09/2021 1428   CL 108 07/09/2021 1428   CO2 26 07/09/2021 1428   GLUCOSE 89 07/09/2021 1428   BUN 23 07/09/2021 1428   BUN 19 08/09/2020 1153   CREATININE 1.35 (H) 07/09/2021 1428   CREATININE 1.47 (H) 08/24/2015 1257   CALCIUM 9.1 07/09/2021 1428   PROT 6.2 (L) 07/09/2021 1428   PROT 6.7 08/09/2020 1153   ALBUMIN 3.4 (L) 07/09/2021 1428   ALBUMIN 4.3 08/09/2020 1153   AST 29 07/09/2021 1428   ALT 37 07/09/2021 1428   ALKPHOS 92 07/09/2021 1428   BILITOT 0.5 07/09/2021 1428   BILITOT 0.4 08/09/2020 1153   GFRNONAA 54 (L) 07/09/2021 1428   GFRAA 54 (L) 08/09/2020 1153    No results found for: TOTALPROTELP, ALBUMINELP, A1GS, A2GS, BETS, BETA2SER, GAMS, MSPIKE, SPEI  Lab Results  Component Value Date   WBC 7.3 07/09/2021   NEUTROABS 4.4 07/09/2021   HGB 14.0 07/09/2021   HCT 42.6 07/09/2021   MCV 86.4 07/09/2021   PLT 177 07/09/2021  No results found for: LABCA2  No components  found for: [id number]  No results for input(s): INR in the last 168 hours.  No results found for: LABCA2  No results found for: ZCH885  No results found for: OYD741  No results found for: OIN867  No results found for: CA2729  No components found for: HGQUANT  No results found for: CEA1 / No results found for: CEA1   No results found for: AFPTUMOR  No results found for: CHROMOGRNA  No results found for: KPAFRELGTCHN, LAMBDASER, KAPLAMBRATIO (kappa/lambda light chains)  No results found for: HGBA, HGBA2QUANT, HGBFQUANT, HGBSQUAN (Hemoglobinopathy evaluation)   No results found for: LDH  No results found for: IRON, TIBC, IRONPCTSAT (Iron and TIBC)  No results found for: FERRITIN  Urinalysis No results found for: COLORURINE, APPEARANCEUR, LABSPEC, PHURINE, GLUCOSEU, HGBUR, BILIRUBINUR, KETONESUR, PROTEINUR, UROBILINOGEN, NITRITE, LEUKOCYTESUR   STUDIES: No results found.   ELIGIBLE FOR AVAILABLE RESEARCH PROTOCOL: no  ASSESSMENT: 75 y.o. [place] man status post right mastectomy 03/16/2020 for a pT2 pN0, stage IIA invasive ductal carcinoma, grade 2, with negative margins; estrogen receptor strongly positive, progesterone receptor moderately positive, HER-2 negative by FISH, MIB-1 of 15%  (a) a total of 3 sentinel lymph nodes were removed  (1) Oncotype score of 40 predicts a risk of recurrence outside the breast within the next 9 years of 28% if the patient's only systemic therapy is antiestrogens for 5 years.  It also predicts a significant benefit from adjuvant chemotherapy.   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 STK11, TP53, TSC1, TSC2, and VHL.  The following genes were evaluated for sequence changes only: SDHA and HOXB13 c.251G>A variant only.   (4) tamoxifen started 10/30/2019  (A) on anticoagulation due to A-fib   PLAN: Logan Wright is a little over a year out from definitive surgery for his breast cancer with no evidence of disease recurrence.  This is very favorable.  He is tolerating tamoxifen well and the plan is to continue that a minimum of 5 years.  I commended his diet and exercise program which is excellent.  He will return to see [place] in 1 year.  He knows to call for any other issue that may develop before the next visit.  Total encounter time 20 minutes.Sarajane Jews C. Chinonso Linker, MD 07/09/21 3:26 PM Medical Oncology and Hematology [facility] [place], [place] [address] Tel. [phone]    Fax. [phone]   I, Wilburn Mylar, am acting as scribe for Dr. [name]. Gizelle Whetsel.  I, Lurline Del MD, have reviewed the above documentation for accuracy and completeness, and I agree with the above.   *Total Encounter Time as defined by the Centers for Medicare and Medicaid Services includes, in addition to the face-to-face time of a patient visit (documented in the note above) non-face-to-face time: obtaining and reviewing outside history, ordering and reviewing medications, tests or procedures, care coordination (communications  with other health care professionals or caregivers) and documentation in the medical record.

## 2021-07-10 ENCOUNTER — Encounter: Payer: Self-pay | Admitting: Oncology

## 2021-07-10 LAB — PSA, TOTAL AND FREE
PSA, Free Pct: 26.7 %
PSA, Free: 0.24 ng/mL
Prostate Specific Ag, Serum: 0.9 ng/mL (ref 0.0–4.0)

## 2021-07-12 ENCOUNTER — Encounter: Payer: Self-pay | Admitting: Oncology

## 2021-07-19 IMAGING — MG MM BREAST LOCALIZATION CLIP
4 series · 4 of 12 positions shown · non-contrast
Comparison: Previous exam(s).

CLINICAL DATA: Post biopsy mammogram of the right breast for clip
placement.

EXAM:
DIAGNOSTIC RIGHT MAMMOGRAM POST ULTRASOUND BIOPSY

[R ML synth-2D]
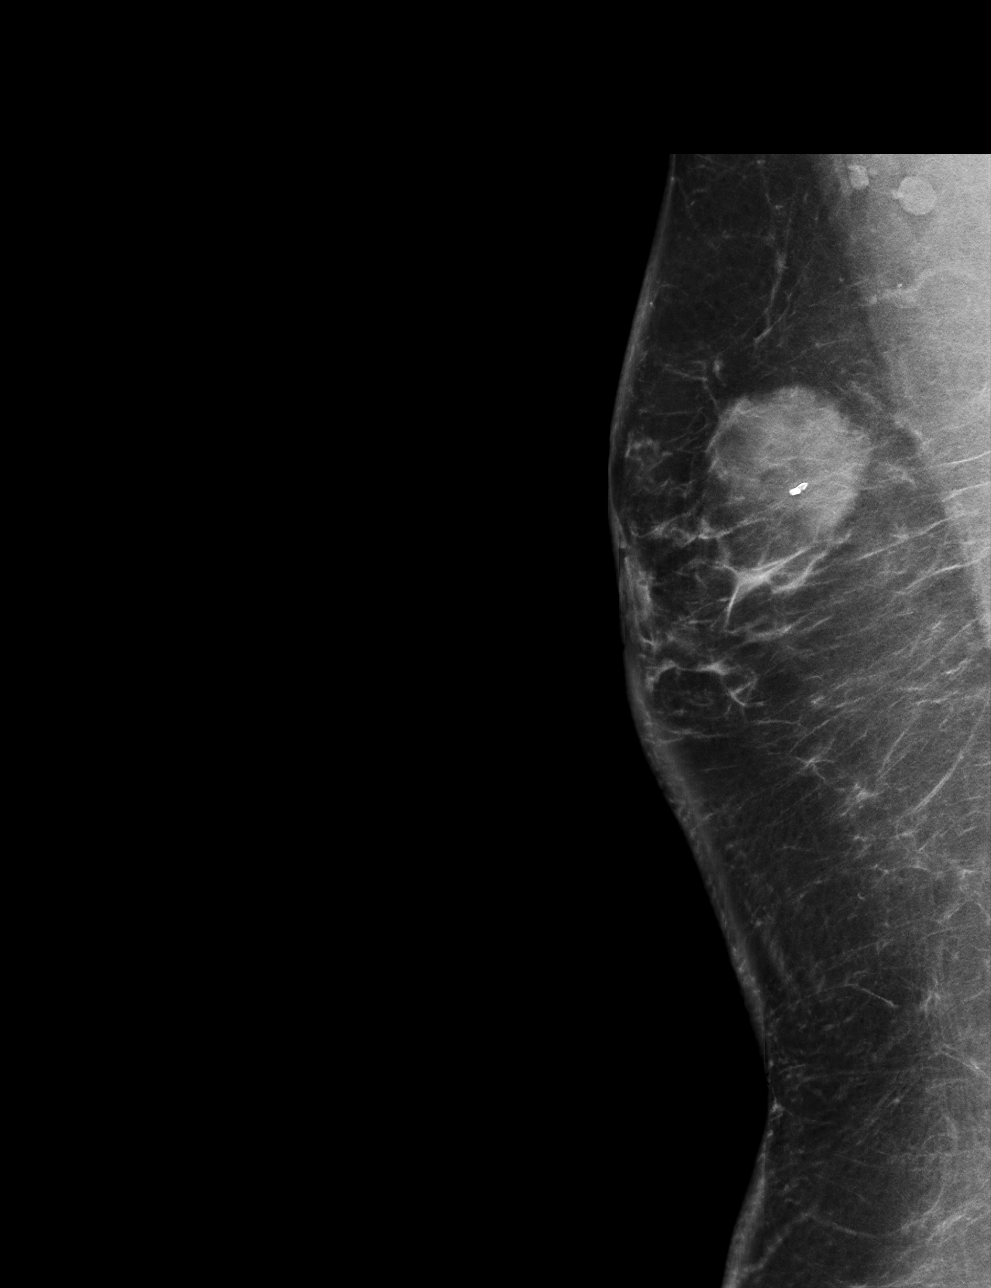

[R CC synth-2D]
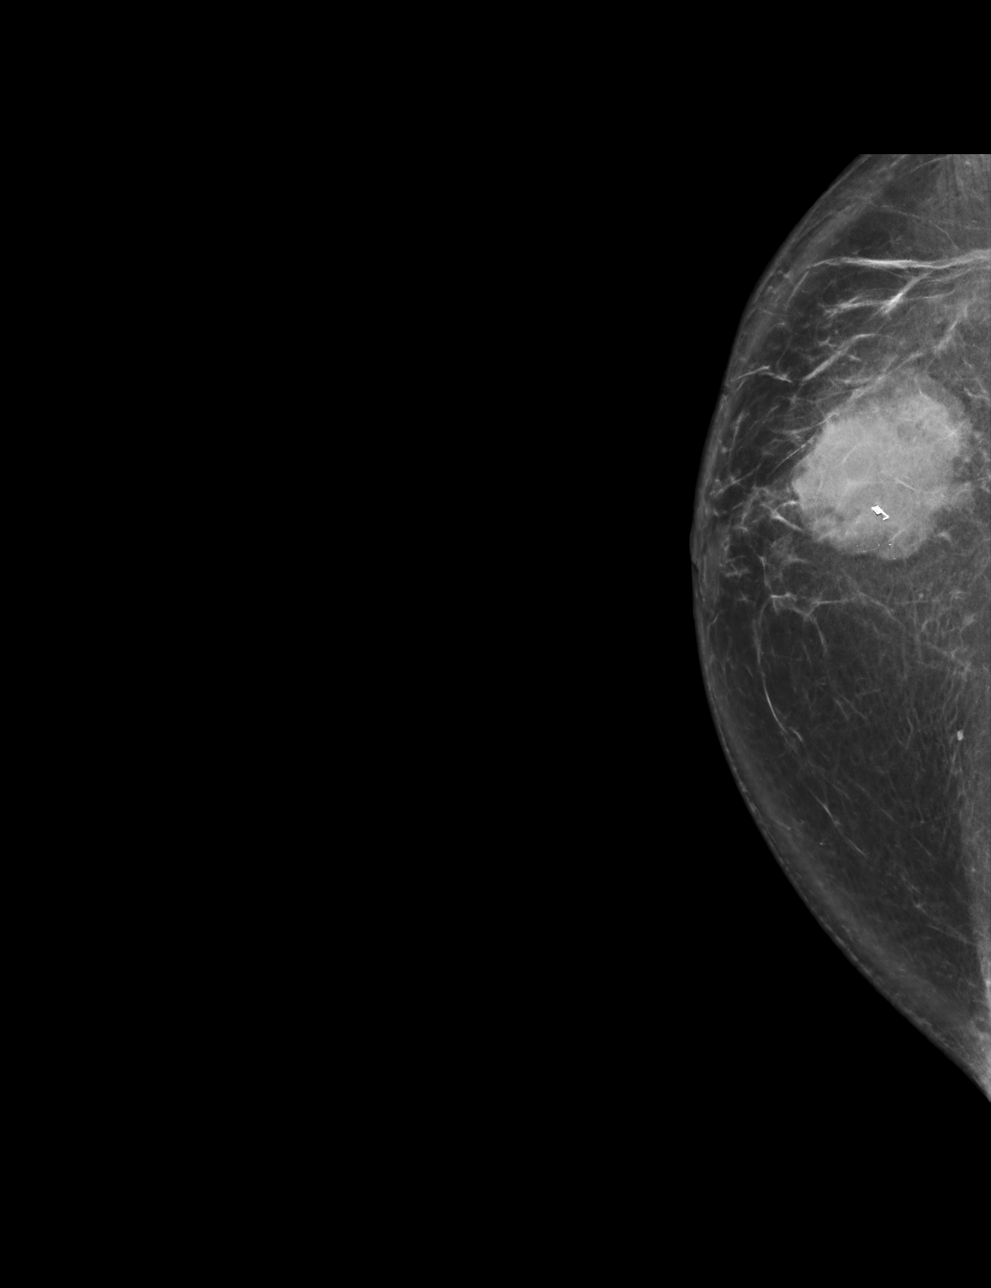

[R CC tomo · tomo slice 49/96.0]
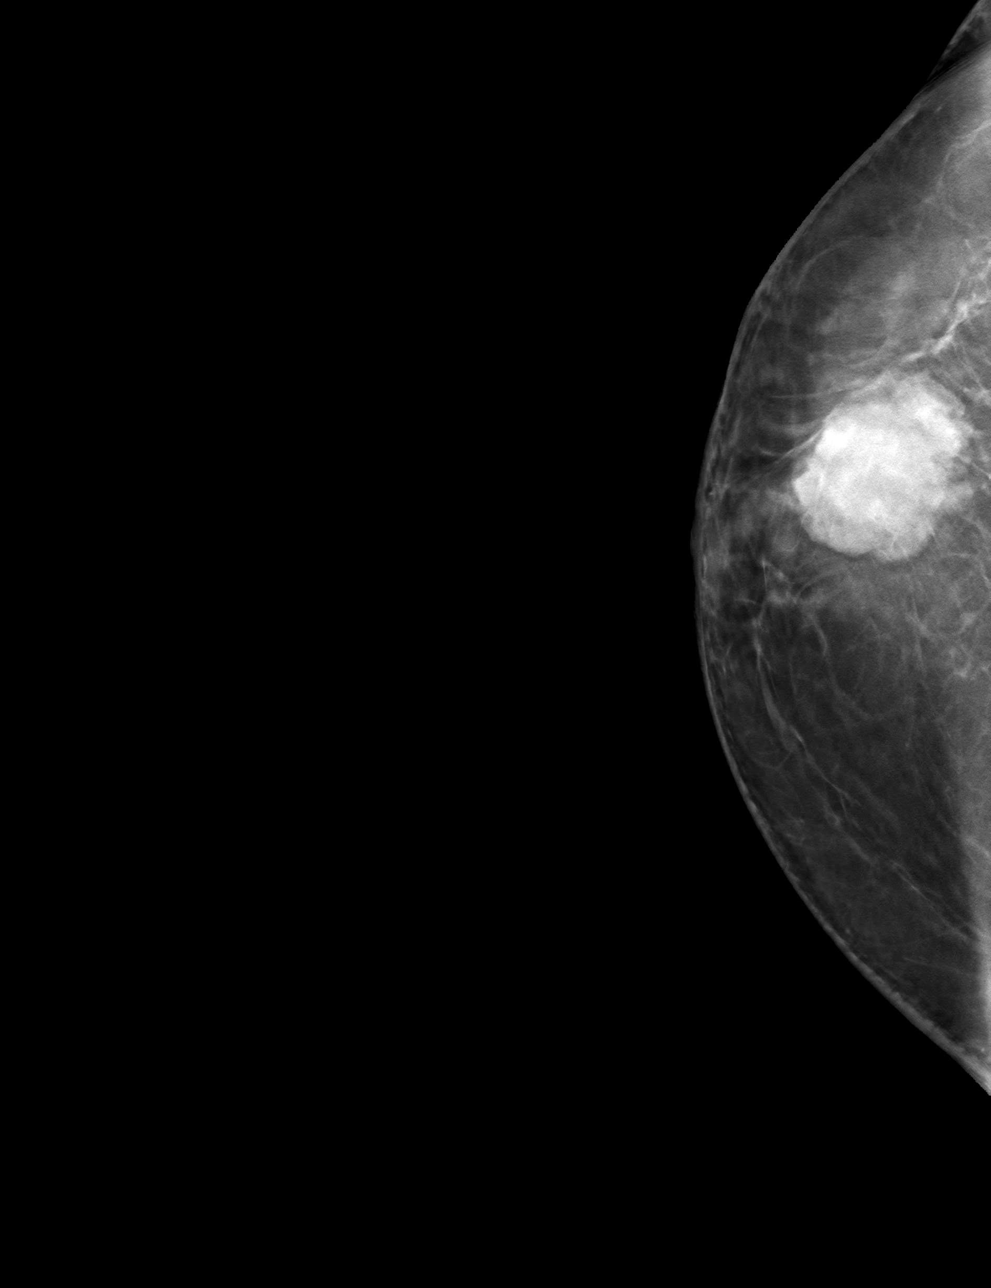

[R ML tomo · tomo slice 57/113.0]
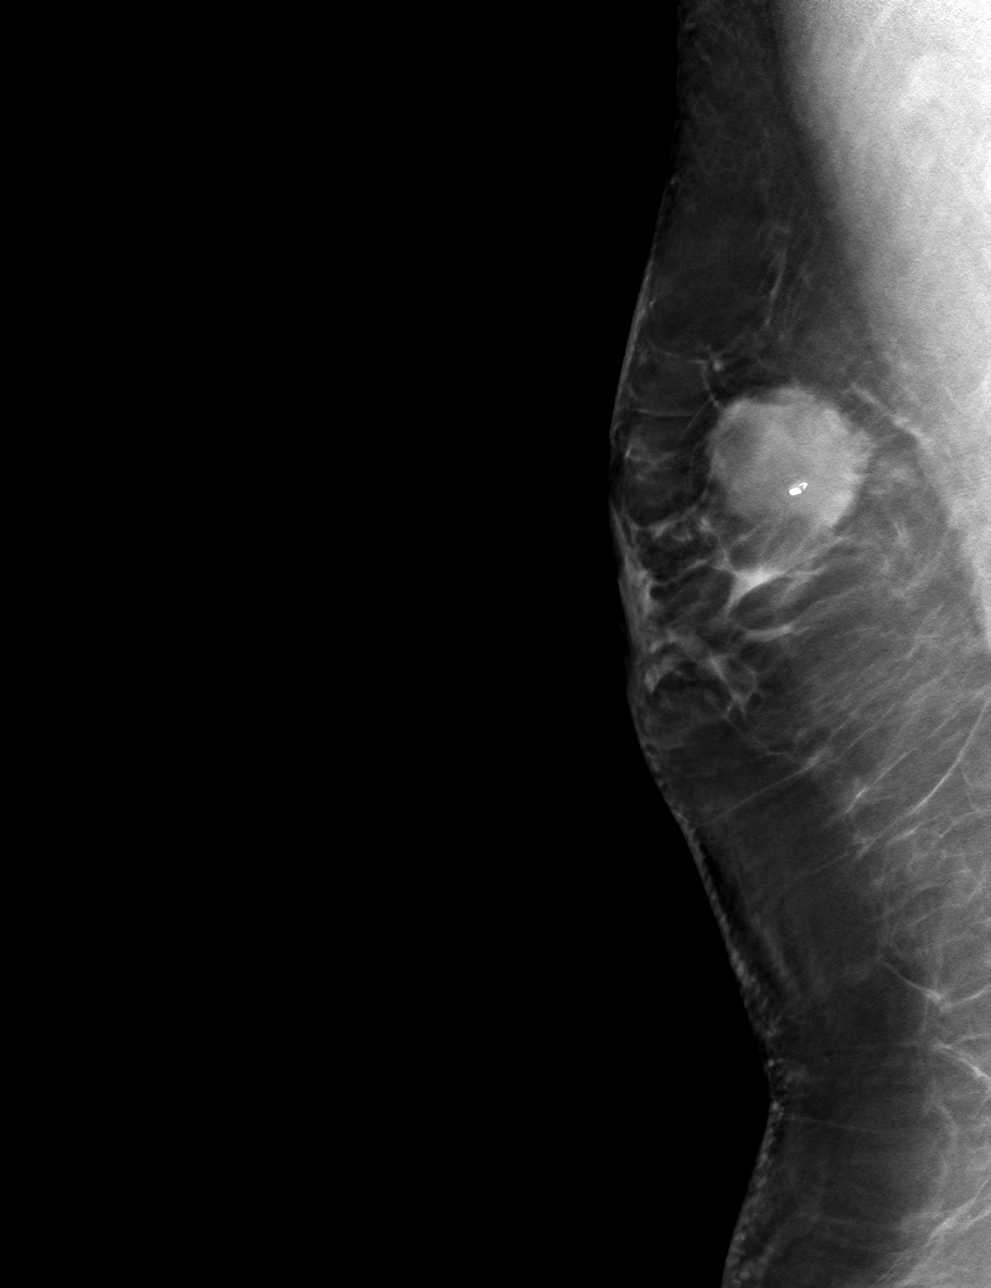

[4 of 12 positions shown; findings below may reference images not displayed]

FINDINGS: Mammographic images were obtained following ultrasound guided biopsy
of a mass in the right breast at 11 o'clock. The biopsy marking clip
is in expected position at the site of biopsy.
IMPRESSION: Appropriate positioning of the coil shaped biopsy marking clip at
the site of biopsy in the upper-outer right breast.

Final Assessment: Post Procedure Mammograms for Marker Placement

## 2021-08-11 DIAGNOSIS — I10 Essential (primary) hypertension: Secondary | ICD-10-CM | POA: Diagnosis not present

## 2021-08-11 DIAGNOSIS — E782 Mixed hyperlipidemia: Secondary | ICD-10-CM | POA: Diagnosis not present

## 2021-08-11 DIAGNOSIS — I4891 Unspecified atrial fibrillation: Secondary | ICD-10-CM | POA: Diagnosis not present

## 2021-08-11 DIAGNOSIS — M1710 Unilateral primary osteoarthritis, unspecified knee: Secondary | ICD-10-CM | POA: Diagnosis not present

## 2021-08-11 DIAGNOSIS — K219 Gastro-esophageal reflux disease without esophagitis: Secondary | ICD-10-CM | POA: Diagnosis not present

## 2021-08-11 DIAGNOSIS — M1610 Unilateral primary osteoarthritis, unspecified hip: Secondary | ICD-10-CM | POA: Diagnosis not present

## 2021-08-11 DIAGNOSIS — N183 Chronic kidney disease, stage 3 unspecified: Secondary | ICD-10-CM | POA: Diagnosis not present

## 2021-08-16 DIAGNOSIS — I4891 Unspecified atrial fibrillation: Secondary | ICD-10-CM | POA: Diagnosis not present

## 2021-08-16 DIAGNOSIS — E663 Overweight: Secondary | ICD-10-CM | POA: Diagnosis not present

## 2021-08-16 DIAGNOSIS — R739 Hyperglycemia, unspecified: Secondary | ICD-10-CM | POA: Diagnosis not present

## 2021-08-16 DIAGNOSIS — R7401 Elevation of levels of liver transaminase levels: Secondary | ICD-10-CM | POA: Diagnosis not present

## 2021-08-16 DIAGNOSIS — I10 Essential (primary) hypertension: Secondary | ICD-10-CM | POA: Diagnosis not present

## 2021-08-16 DIAGNOSIS — I700 Atherosclerosis of aorta: Secondary | ICD-10-CM | POA: Diagnosis not present

## 2021-08-16 DIAGNOSIS — R809 Proteinuria, unspecified: Secondary | ICD-10-CM | POA: Diagnosis not present

## 2021-08-16 DIAGNOSIS — N183 Chronic kidney disease, stage 3 unspecified: Secondary | ICD-10-CM | POA: Diagnosis not present

## 2021-08-16 DIAGNOSIS — Z87898 Personal history of other specified conditions: Secondary | ICD-10-CM | POA: Diagnosis not present

## 2021-08-16 DIAGNOSIS — C50921 Malignant neoplasm of unspecified site of right male breast: Secondary | ICD-10-CM | POA: Diagnosis not present

## 2021-08-16 DIAGNOSIS — Z7901 Long term (current) use of anticoagulants: Secondary | ICD-10-CM | POA: Diagnosis not present

## 2021-08-16 DIAGNOSIS — M7662 Achilles tendinitis, left leg: Secondary | ICD-10-CM | POA: Diagnosis not present

## 2021-08-18 ENCOUNTER — Encounter: Payer: Self-pay | Admitting: Oncology

## 2021-08-19 NOTE — Progress Notes (Signed)
Logan Wright  [phone] [phone]     ID: JDEN WANT DOB: 1946-04-09  MR#: [id number]  CSN#:[id number]  Patient Care Team: [name], MD as PCP - General (Family Medicine) [name], [name], MD as PCP - Cardiology (Cardiology) Harvy Riera, [name], MD as Consulting Physician (Oncology) [name], MD as Consulting Physician (General Surgery) Kyung Rudd, MD as Consulting Physician (Radiation Oncology) Lavonna Monarch, MD as Consulting Physician (Dermatology) Paralee Cancel, MD as Consulting Physician (Orthopedic Surgery) Mauro Kaufmann, RN as Oncology Nurse Navigator Rockwell Germany, RN as Oncology Nurse Navigator Chauncey Cruel, MD OTHER MD:  CHIEF COMPLAINT: estrogen receptor positive breast cancer; BRCA1+  CURRENT TREATMENT: tamoxifen   INTERVAL HISTORY: Logan Wright returns today for follow up of his estrogen receptor positive breast cancer. He requested to be seen today due to palpable possible abnormalities in the left breast: "under my left nipple I feel some 1(-3) very small lumps (maybe just 1/4 inch)"  He had bilateral diagnostic mammography on 02/21/2020 at the [facility] with no findings of concern on the left.  Breast density was category B.  He started tamoxifen on 10/29/2020 Gwynne Edinger Day).  He is taking it with no side effects that he is aware of.    Recall that he is on Eliquis/apixaban for his atrial fibrillation so the slight increase in clotting risk should not be an issue for him.   REVIEW OF SYSTEMS: Logan Wright walks around the neighborhood 30 to 60 minutes just about every day.  He also does exercise classes for about an hour at a time 3-4 times a week.  He is looking for tai chi.  He does have a balance problem and falls are a major concern.  Aside from this a detailed review of systems today was stable   COVID 19 VACCINATION STATUS:  status post Pfizer x4  HISTORY OF CURRENT ILLNESS: From the  original intake note:  Logan Wright palpated a right breast mass sometime late 2020.  He had a prior left breast mass in the 1990s which was found to be a fat deposit and he assumed this would be the same.  He did mention it to Dr. Jacelyn Grip at the time of their regular physical and he set Logan Wright up for bilateral diagnostic mammography with tomography and right breast ultrasonography at [facility] on 02/21/2020 showing: breast density category B; 3.2 cm irregular mass at 11 o'clock in the right breast; normal right axillary lymph nodes.  Accordingly on 03/02/2020 the patient proceeded to biopsy of the right breast area in question. The pathology from this procedure ([id number]) showed: invasive ductal carcinoma, grade 2-3. Prognostic indicators significant for: estrogen receptor, 90% positive with strong staining intensity and progesterone receptor, 5% positive with moderate staining intensity. Proliferation marker Ki67 at 15%. HER2 equivocal by immunohistochemistry (2+), but negative by fluorescent in situ hybridization with a signals ratio 1.35 and number per cell 3.1.  He met with Dr. Lisbeth Renshaw on 03/16/2020 to discuss adjuvant radiation therapy. Per consultation note,  postmastectomy radiation was felt to be unlikely.  He proceeded to right mastectomy on 03/16/2020 under Dr. [name]. Pathology from the procedure [id number]) showed: invasive ductal carcinoma, grade 2, 3.2 cm; margins not involved.  All three sentinel lymph nodes removed were negative for carcinoma (0/3).  The patient's subsequent history is as detailed below.   PAST MEDICAL HISTORY: Past Medical History:  Diagnosis Date   Ascending aorta dilatation (HCC)    Breast cancer,  right ([place])    CKD (chronic kidney disease), stage III ([place])    Colon adenoma 2015   Coronary artery calcification seen on CT scan    Diverticulosis    DENIES    DJD (degenerative joint disease)    ED (erectile dysfunction)    Elevated blood pressure  2007-2008   Esophageal reflux    GERD (gastroesophageal reflux disease)    Gout    Gynecomastia    LEFT   Hearing loss    HTN (hypertension)    Hypercholesteremia    Non-cardiac chest pain    OA (osteoarthritis)    LEFT SHOULDER   Onychomycosis of foot with other complication    PAF (paroxysmal atrial fibrillation) (HCC)    Pre-diabetes    Seborrhea    Severe frontal headaches    RESOLVED    Vertigo    RESOLVED    Wears glasses    Wears hearing aid     PAST SURGICAL HISTORY: Past Surgical History:  Procedure Laterality Date   COLONSCOPY      HIP ARTHROPLASTY Bilateral    AT [facility]    IR CV LINE INJECTION  05/14/2020   IR IMAGING GUIDED PORT INSERTION  05/17/2020   IR REMOVAL TUN ACCESS W/ PORT W/O FL MOD SED  05/17/2020   KNEE ARTHROSCOPY     UNSURE WHICH KNEE    MASTECTOMY W/ SENTINEL NODE BIOPSY Right 03/16/2020   Procedure: RIGHT BREAST MASTECTOMY WITH SENTINEL LYMPH NODE BIOPSY;  Surgeon: [name], MD;  Location: [facility];  Service: General;  Laterality: Right;   PORTACATH PLACEMENT Left 05/08/2020   Procedure: INSERTION PORT-A-CATH WITH ULTRASOUND;  Surgeon: [name], MD;  Location: [facility];  Service: General;  Laterality: Left;  LMA   TOTAL KNEE ARTHROPLASTY Left 11/23/2018   Procedure: TOTAL KNEE ARTHROPLASTY;  Surgeon: Paralee Cancel, MD;  Location: [facility];  Service: Orthopedics;  Laterality: Left;  70 mins    FAMILY HISTORY: Family History  Problem Relation Age of Onset   Heart attack Mother    Stroke Mother    Heart failure Father        17   Hypertension Neg Hx   The patient is of Ashkenazi background.  His father died at age 27 and his mother at age 108.  On the father's side, the paternal grandfather died young from unclear causes.  The paternal grandmother did not have cancer.  The patient's father was a single child.  On the maternal side the patient's maternal grandmother died young again from unclear reasons but the  paternal grandfather and the 3 maternal aunts were all died in old age with no cancer.  The patient also knows of no cancer in his cousins.  He himself has 1 brother with no history of cancer, no sisters.   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  He is a IT sales professional.  His wife of 78+ years Logan Wright is very active in the community.  Their daughter Logan Wright his Engineer, building services for a [facility] psychiatric group.  She lives in [place].  Son Logan Wright lives in [place] by himself.  Patient has 3 grandchildren.  He attends [employer]    ADVANCED DIRECTIVES: In the absence of any documentation to the contrary, the patient's spouse is his  HCPOA.    HEALTH MAINTENANCE: Social History   Tobacco Use   Smoking status: Never   Smokeless tobacco: Never  Vaping Use   Vaping Use: Never used  Substance Use Topics   Alcohol use: No    Alcohol/week: 0.0 standard drinks   Drug use: No     Colonoscopy: [facility]  Bone density: -  PSA:   No Known Allergies  Current Outpatient Medications  Medication Sig Dispense Refill   tamoxifen (NOLVADEX) 20 MG tablet Take 1 tablet (20 mg total) by mouth daily. 90 tablet 12   allopurinol (ZYLOPRIM) 100 MG tablet Take 200 mg by mouth daily.     atorvastatin (LIPITOR) 40 MG tablet Take 40 mg by mouth at bedtime.      diltiazem (CARDIZEM CD) 120 MG 24 hr capsule Take 1 capsule (120 mg total) by mouth daily. 90 capsule 3   ELIQUIS 5 MG TABS tablet Take 1 tablet by mouth twice daily 180 tablet 1   GLUCOSAMINE-CHONDROITIN PO Take 2 tablets by mouth daily.     Multiple Vitamin (MULTIVITAMIN) tablet Take 1 tablet by mouth daily.      Omega-3 Fatty Acids (FISH OIL ULTRA) 1400 MG CAPS Take 1,400-2,800 mg by mouth See admin instructions. Take 1400 mg in the morning and  2800 mg in the evening     Potassium 99 MG TABS Take 1 tablet by mouth daily.     No current facility-administered medications for this visit.    OBJECTIVE: White man who appears well  Vitals:   08/20/21 1414  BP: 133/64  Pulse: (!) 57  Resp: 16  Temp: 97.9 F (36.6 C)  SpO2: 98%       Body mass index is 26.61 kg/m.   Wt Readings from Last 3 Encounters:  08/20/21 169 lb 14.4 oz (77.1 kg)  07/09/21 167 lb 9.6 oz (76 kg)  12/20/20 192 lb 8 oz (87.3 kg)      ECOG FS:1 - Symptomatic but completely ambulatory  Sclerae unicteric, EOMs intact Wearing a mask No cervical or supraclavicular adenopathy Lungs no rales or rhonchi Heart regular rate and rhythm Abd soft, nontender, positive bowel sounds MSK no focal spinal tenderness, no upper extremity lymphedema Neuro: nonfocal, well oriented, appropriate affect Breasts: The right breast is status postmastectomy.  There is no evidence of chest wall recurrence.  In the left breast superiorly at the 12 o'clock position 1 inch above the nipple there is a palpable half a centimeter oval mass, which is not tender and does not involve the skin or nipple.  Both axillae are benign.    LAB RESULTS:  CMP     Component Value Date/Time   NA 142 07/09/2021 1428   NA 142 08/09/2020 1153   K 4.6 07/09/2021 1428   CL 108 07/09/2021 1428   CO2 26 07/09/2021 1428   GLUCOSE 89 07/09/2021 1428   BUN 23 07/09/2021 1428   BUN 19 08/09/2020 1153   CREATININE 1.35 (H) 07/09/2021 1428   CREATININE 1.47 (H) 08/24/2015 1257   CALCIUM 9.1 07/09/2021 1428   PROT 6.2 (L) 07/09/2021 1428   PROT 6.7 08/09/2020 1153   ALBUMIN 3.4 (L) 07/09/2021 1428   ALBUMIN 4.3 08/09/2020 1153   AST 29 07/09/2021 1428   ALT 37 07/09/2021 1428   ALKPHOS 92 07/09/2021 1428   BILITOT 0.5 07/09/2021 1428   BILITOT 0.4 08/09/2020 1153   GFRNONAA 54 (L) 07/09/2021 1428   GFRAA 54 (L) 08/09/2020 1153    No  results found for: TOTALPROTELP, ALBUMINELP, A1GS, A2GS,  BETS, BETA2SER, GAMS, MSPIKE, SPEI  Lab Results  Component Value Date   WBC 7.3 07/09/2021   NEUTROABS 4.4 07/09/2021   HGB 14.0 07/09/2021   HCT 42.6 07/09/2021   MCV 86.4 07/09/2021   PLT 177 07/09/2021    No results found for: LABCA2  No components found for: TDVVOH607  No results for input(s): INR in the last 168 hours.  No results found for: LABCA2  No results found for: PXT062  No results found for: IRS854  No results found for: OEV035  No results found for: CA2729  No components found for: HGQUANT  No results found for: CEA1 / No results found for: CEA1   No results found for: AFPTUMOR  No results found for: CHROMOGRNA  No results found for: KPAFRELGTCHN, LAMBDASER, KAPLAMBRATIO (kappa/lambda light chains)  No results found for: HGBA, HGBA2QUANT, HGBFQUANT, HGBSQUAN (Hemoglobinopathy evaluation)   No results found for: LDH  No results found for: IRON, TIBC, IRONPCTSAT (Iron and TIBC)  No results found for: FERRITIN  Urinalysis No results found for: COLORURINE, APPEARANCEUR, LABSPEC, PHURINE, GLUCOSEU, HGBUR, BILIRUBINUR, KETONESUR, PROTEINUR, UROBILINOGEN, NITRITE, LEUKOCYTESUR   STUDIES: No results found.   ELIGIBLE FOR AVAILABLE RESEARCH PROTOCOL: no  ASSESSMENT: 75 y.o. [place] man status post right mastectomy 03/16/2020 for a pT2 pN0, stage IIA invasive ductal carcinoma, grade 2, with negative margins; estrogen receptor strongly positive, progesterone receptor moderately positive, HER-2 negative by FISH, MIB-1 of 15%  (a) a total of 3 sentinel lymph nodes were removed  (1) Oncotype score of 40 predicts a risk of recurrence outside the breast within the next 9 years of 28% if the patient's only systemic therapy is antiestrogens for 5 years.  It also predicts a significant benefit from adjuvant chemotherapy.   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 STK11, TP53, TSC1, TSC2, and VHL.  The following genes were evaluated for sequence changes only: SDHA and HOXB13 c.251G>A variant only.   (4) tamoxifen started 10/30/2019  (A) on anticoagulation due to A-fib   PLAN: Logan Wright is is now a year and a half out from definitive surgery for his breast cancer.  There is no evidence of disease recurrence.  He is tolerating tamoxifen well and the plan is to continue that a minimum of 5 years.  He does have a palpable mass in the left breast.  He has lost quite a bit of weight on the basis of diet and exercise and it could be what we are feeling there is fat necrosis or perhaps simple fibrous tissue.  Nevertheless it needs work-up so I have set him up for a left mammogram to be done within the next week or 2 (he is planning to leave for [place] this weekend and will be gone a week).  I am also referring him to physical therapy for balance issues.  Otherwise he already has a follow-up appointment with  [place] in September after his August mammography.  Total encounter time 25 minutes.Sarajane Jews C. Chinenye Katzenberger, MD 08/20/21 2:48 PM [facility] [address] [address], [place] [address] Tel. [phone]    Fax. [phone]   I, Wilburn Mylar, am acting as scribe for Dr. [name]. Malaney Mcbean.  I,  Lurline Del MD, have reviewed the above documentation for accuracy and completeness, and I agree with the above.   *Total Encounter Time as defined by the Centers for Medicare and Medicaid Services includes, in addition to the face-to-face time of a patient visit (documented in the note above) non-face-to-face time: obtaining and reviewing outside history, ordering and reviewing medications, tests or procedures, care coordination (communications with other health care professionals or caregivers) and documentation in the medical record.

## 2021-08-20 ENCOUNTER — Inpatient Hospital Stay: Payer: PPO | Attending: Oncology | Admitting: Oncology

## 2021-08-20 ENCOUNTER — Other Ambulatory Visit: Payer: Self-pay

## 2021-08-20 VITALS — BP 133/64 | HR 57 | Temp 97.9°F | Resp 16 | Ht 67.0 in | Wt 169.9 lb

## 2021-08-20 DIAGNOSIS — C50421 Malignant neoplasm of upper-outer quadrant of right male breast: Secondary | ICD-10-CM | POA: Diagnosis not present

## 2021-08-20 DIAGNOSIS — Z170 Estrogen receptor positive status [ER+]: Secondary | ICD-10-CM | POA: Insufficient documentation

## 2021-08-20 MED ORDER — TAMOXIFEN CITRATE 20 MG PO TABS: 20.0000 mg | ORAL_TABLET | Freq: Every day | ORAL | 12 refills | Status: AC

## 2021-08-22 ENCOUNTER — Encounter: Payer: Self-pay | Admitting: Oncology

## 2021-08-22 DIAGNOSIS — N183 Chronic kidney disease, stage 3 unspecified: Secondary | ICD-10-CM | POA: Diagnosis not present

## 2021-08-22 DIAGNOSIS — I700 Atherosclerosis of aorta: Secondary | ICD-10-CM | POA: Diagnosis not present

## 2021-08-22 DIAGNOSIS — M109 Gout, unspecified: Secondary | ICD-10-CM | POA: Diagnosis not present

## 2021-08-22 DIAGNOSIS — R809 Proteinuria, unspecified: Secondary | ICD-10-CM | POA: Diagnosis not present

## 2021-08-22 DIAGNOSIS — R7401 Elevation of levels of liver transaminase levels: Secondary | ICD-10-CM | POA: Diagnosis not present

## 2021-08-22 DIAGNOSIS — R739 Hyperglycemia, unspecified: Secondary | ICD-10-CM | POA: Diagnosis not present

## 2021-08-22 DIAGNOSIS — N3941 Urge incontinence: Secondary | ICD-10-CM | POA: Diagnosis not present

## 2021-08-22 DIAGNOSIS — Z125 Encounter for screening for malignant neoplasm of prostate: Secondary | ICD-10-CM | POA: Diagnosis not present

## 2021-08-26 ENCOUNTER — Other Ambulatory Visit: Payer: Self-pay | Admitting: Oncology

## 2021-08-26 DIAGNOSIS — C50421 Malignant neoplasm of upper-outer quadrant of right male breast: Secondary | ICD-10-CM

## 2021-08-26 DIAGNOSIS — Z170 Estrogen receptor positive status [ER+]: Secondary | ICD-10-CM

## 2021-09-02 ENCOUNTER — Encounter: Payer: Self-pay | Admitting: Oncology

## 2021-09-03 ENCOUNTER — Encounter: Payer: Self-pay | Admitting: Oncology

## 2021-09-11 ENCOUNTER — Other Ambulatory Visit: Payer: Self-pay

## 2021-09-11 ENCOUNTER — Encounter: Payer: Self-pay | Admitting: Rehabilitation

## 2021-09-11 ENCOUNTER — Ambulatory Visit: Payer: PPO | Attending: Oncology | Admitting: Rehabilitation

## 2021-09-11 ENCOUNTER — Other Ambulatory Visit: Payer: Self-pay | Admitting: Cardiovascular Disease

## 2021-09-11 DIAGNOSIS — C50421 Malignant neoplasm of upper-outer quadrant of right male breast: Secondary | ICD-10-CM | POA: Diagnosis not present

## 2021-09-11 DIAGNOSIS — R262 Difficulty in walking, not elsewhere classified: Secondary | ICD-10-CM | POA: Insufficient documentation

## 2021-09-11 DIAGNOSIS — Z483 Aftercare following surgery for neoplasm: Secondary | ICD-10-CM | POA: Diagnosis not present

## 2021-09-11 DIAGNOSIS — Z170 Estrogen receptor positive status [ER+]: Secondary | ICD-10-CM | POA: Diagnosis not present

## 2021-09-11 NOTE — Patient Instructions (Signed)
Access Code: [email or web address] URL: [email or web address]: 11/30/2022Prepared by: Mahlon Gammon Notes Perform this program 3 times per week  Try to walk at least 3 times per week for up to 30 minutes  If you want to use ankle weights for the strengthening exercises start with 2-3 pounds and increase slowly up to 10 pounds.  Exercises  Standing Hip Abduction with Unilateral Counter Support - 3 x weekly - 1-3 sets - 10 reps  Heel rises with counter support - 3 x weekly - 1 sets - 10 reps  Heel Toe Raises with Counter Support - 3 x weekly - 1 sets - 10 reps  Backward Walking with Counter Support - 3 x weekly - steps 10  Side Stepping with Counter Support - 3 x weekly - steps 10  Standing Tandem Balance with Counter Support - 3 x weekly - 1 reps - 10-20 seconds hold  Tandem Walking with Counter Support - 3 x weekly - steps 10  Standing Single Leg Stance with Counter Support - 3 x weekly - 1 reps - 10-20 second hold  Standing with Head Rotation - 1 x daily - 7 x weekly - 1-3 sets - 10 reps - 20-30 seconds hold

## 2021-09-11 NOTE — Therapy (Signed)
[facility] @ [place], [place], [address] Phone: [phone]   Fax:  [phone]  Physical Therapy Evaluation  Patient Details  Name: Logan Wright MRN: [id number] Date of Birth: 1946/06/21 Referring Provider (PT): Dr. Jana Hakim    Encounter Date: 09/11/2021   PT End of Session - 09/11/21 1050     Visit Number 1    Number of Visits 1    PT Start Time 1000    PT Stop Time 5093    PT Time Calculation (min) 48 min    Activity Tolerance Patient tolerated treatment well    Behavior During Therapy [facility] for tasks assessed/performed             Past Medical History:  Diagnosis Date   Ascending aorta dilatation ([facility])    Breast cancer, right ([place])    CKD (chronic kidney disease), stage III ([place])    Colon adenoma 2015   Coronary artery calcification seen on CT scan    Diverticulosis    DENIES    DJD (degenerative joint disease)    ED (erectile dysfunction)    Elevated blood pressure 2007-2008   Esophageal reflux    GERD (gastroesophageal reflux disease)    Gout    Gynecomastia    LEFT   Hearing loss    HTN (hypertension)    Hypercholesteremia    Non-cardiac chest pain    OA (osteoarthritis)    LEFT SHOULDER   Onychomycosis of foot with other complication    PAF (paroxysmal atrial fibrillation) ([place])    Pre-diabetes    Seborrhea    Severe frontal headaches    RESOLVED    Vertigo    RESOLVED    Wears glasses    Wears hearing aid     Past Surgical History:  Procedure Laterality Date   COLONSCOPY      HIP ARTHROPLASTY Bilateral    AT [facility]    IR CV LINE INJECTION  05/14/2020   IR IMAGING GUIDED PORT INSERTION  05/17/2020   IR REMOVAL TUN ACCESS W/ PORT W/O FL MOD SED  05/17/2020   KNEE ARTHROSCOPY     UNSURE WHICH KNEE    MASTECTOMY W/ SENTINEL NODE BIOPSY Right 03/16/2020   Procedure: RIGHT BREAST MASTECTOMY WITH SENTINEL LYMPH NODE BIOPSY;  Surgeon: [name], MD;  Location: [facility];  Service: General;  Laterality: Right;   PORTACATH PLACEMENT Left 05/08/2020   Procedure: INSERTION PORT-A-CATH WITH ULTRASOUND;  Surgeon: [name], MD;  Location: [facility];  Service: General;  Laterality: Left;  LMA   TOTAL KNEE ARTHROPLASTY Left 11/23/2018   Procedure: TOTAL KNEE ARTHROPLASTY;  Surgeon: Paralee Cancel, MD;  [facility];  Service: Orthopedics;  Laterality: Left;  70 mins    There were no vitals filed for this visit.    Subjective Assessment - 09/11/21 1000     Subjective I am here for balance problems.  When I walk I tend to drift, standing on one leg is hard, no falls    Pertinent History pt had right mastectomy on 03/16/2020 with 3 lymph nodes removed.  Chemotherapy and no radiation . PMhx: includes bil hip replacements and Lt TKR, Afib, stage 3 CKD,    Limitations --   none   How long can you walk comfortably? i walk 52mn-60min every day    Patient Stated Goals anything to do for balance    Currently in Pain? No/denies  [facility] PT Assessment - 09/11/21 0001       Assessment   Medical Diagnosis right breast cancer    Referring Provider (PT) Dr. Jana Hakim     Onset Date/Surgical Date 03/16/20    Hand Dominance Right    Prior Therapy yes for post op      Precautions   Precaution Comments lymphedema Rt      Balance Screen   Has the patient fallen in the past 6 months No    Has the patient had a decrease in activity level because of a fear of falling?  No    Is the patient reluctant to leave their home because of a fear of falling?  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    Leisure walks every morning and does aerobic exercises and weight lifting        Cognition   Overall Cognitive Status Within Functional Limits for tasks assessed      Functional Tests   Functional tests Sit to Stand      Sit to Stand   Comments 5x sit to stand 7.47, 30sec sit to stand: 18 times      Posture/Postural Control   Posture/Postural Control Postural limitations    Postural Limitations Rounded Shoulders;Forward head;Increased lumbar lordosis      Strength   Overall Strength Within functional limits for tasks performed      Standardized Balance Assessment   Standardized Balance Assessment Berg Balance Test;Dynamic Gait Index      Berg Balance Test   Sit to Stand Able to stand without using hands and stabilize independently    Standing Unsupported Able to stand safely 2 minutes    Sitting with Back Unsupported but Feet Supported on Floor or Stool Able to sit safely and securely 2 minutes    Stand to Sit Sits safely with minimal use of hands    Transfers Able to transfer safely, minor use of hands    Standing Unsupported with Eyes Closed Able to stand 10 seconds with supervision   posterior sway   Standing Unsupported with Feet Together Able to place feet together independently and stand for 1 minute with supervision    From Standing, Reach Forward with Outstretched Arm Can reach confidently >25 cm (10")    From Standing Position, Pick up Object from Floor Able to pick up shoe safely and easily    From Standing Position, Turn to Look Behind Over each Shoulder Looks behind from both sides and weight shifts well    Turn 360 Degrees Able to turn 360 degrees safely in 4 seconds or less    Standing Unsupported, Alternately Place Feet on Step/Stool Able to stand independently and safely and complete 8 steps in 20 seconds    Standing Unsupported, One Foot in Front Needs help to step but can hold 15 seconds   bil around 6 second hold   Standing on One Leg Able to lift leg independently and hold equal to or more than 3 seconds   R: 5secon  L: 6 sec   Total Score  49      Dynamic Gait Index   Level Surface Normal    Change in  Gait Speed Normal    Gait with Horizontal Head Turns Moderate Impairment    Gait with Vertical Head Turns Mild Impairment    Gait and Pivot Turn Mild Impairment    Step Over Obstacle Normal    Step Around Obstacles Normal    Steps Normal    Total Score 20      High Level Balance   High Level Balance Comments on foam EC LOB backwards CGA                        Objective measurements completed on examination: See above findings.       [facility] Adult PT Treatment/Exercise - 09/11/21 0001       Self-Care   Other Self-Care Comments  gave pt new hirsch tai chi information and OTAGO balance components with head turns added for HEP.  Pt wanting to do HEP vs in clinic at this time which this PT was fine with as he has some increased sway with narrow BOS and eyes closed but above the high risk for falls marks for both the BERG and DGI                     PT Education - 09/11/21 1050     Education Details HEP and hirsch tai chi    Person(s) Educated Patient    Methods Explanation;Demonstration;Handout    Comprehension Verbalized understanding                 PT Long Term Goals - 09/11/21 1052       PT LONG TERM GOAL #1   Title Pt will be ind with HEP    Status Achieved                    Plan - 09/11/21 1051     Clinical Impression Statement Pt presents to get ideas regarding balance and feeling swervy when walking.  Gave pt new hirsch tai chi information and OTAGO balance components with head turns added for HEP.  Pt wanting to do HEP vs in clinic at this time which this PT was fine with as he has some increased sway with narrow BOS and eyes closed but above the high risk for falls marks for both the BERG and DGI    PT Frequency One time visit    PT Treatment/Interventions ADLs/Self Care Home Management    PT Next Visit Plan One time visit. pt will call if he has further  questions.    Consulted and Agree with Plan of Care Patient             Patient will benefit from skilled therapeutic intervention in order to improve the following deficits and impairments:     Visit Diagnosis: Aftercare following surgery for neoplasm  Difficulty in walking, not elsewhere classified     Problem List Patient Active Problem List   Diagnosis Date Noted   Atrial fibrillation, new onset ([place]) 07/22/2020   Port-A-Cath in place 06/28/2020   Genetic testing 05/14/2020   S/P mastectomy, right 03/16/2020   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, estrogen receptor positive ([place]) 03/14/2020   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   Non-cardiac chest pain    Gynecomastia    Hypercholesteremia    Seborrhea    Severe frontal headaches    GERD (gastroesophageal reflux disease)    ED (erectile dysfunction)    Diverticulosis  Elevated blood pressure    Vertigo    Onychomycosis of foot with other complication    Hearing loss    Colon adenoma    OA (osteoarthritis)    Esophageal reflux    Chest pain 07/08/2015   Essential hypertension 07/08/2015   Hyperlipidemia 07/08/2015    Stark Bray, PT 09/11/2021, 10:53 AM  [facility] @ [facility] [place], [place], [address] Phone: [phone]   Fax:  [phone]  Name: Logan Wright MRN: [id number] Date of Birth: [birth year]

## 2021-09-13 ENCOUNTER — Encounter: Payer: Self-pay | Admitting: Cardiovascular Disease

## 2021-09-13 DIAGNOSIS — I480 Paroxysmal atrial fibrillation: Secondary | ICD-10-CM

## 2021-09-13 DIAGNOSIS — T8484XA Pain due to internal orthopedic prosthetic devices, implants and grafts, initial encounter: Secondary | ICD-10-CM | POA: Diagnosis not present

## 2021-09-13 DIAGNOSIS — Z96652 Presence of left artificial knee joint: Secondary | ICD-10-CM | POA: Diagnosis not present

## 2021-09-13 MED ORDER — APIXABAN 5 MG PO TABS: 5.0000 mg | ORAL_TABLET | Freq: Two times a day (BID) | ORAL | 1 refills | Status: DC

## 2021-09-13 NOTE — Telephone Encounter (Signed)
Contacted pt to inform him that we are leaving him 2 boxes of Eliquis 5 mg tablets at the front desk for him to pick up. I advised the pt that pif he has any other problems, questions or concerns, to give our office a call.

## 2021-09-20 ENCOUNTER — Ambulatory Visit
Admission: RE | Admit: 2021-09-20 | Discharge: 2021-09-20 | Disposition: A | Discharge status: Home / Self Care | Payer: PPO | Source: Ambulatory Visit | Attending: Adult Health | Admitting: Adult Health

## 2021-09-20 ENCOUNTER — Ambulatory Visit
Admission: RE | Admit: 2021-09-20 | Discharge: 2021-09-20 | Disposition: A | Discharge status: Home / Self Care | Payer: PPO | Source: Ambulatory Visit | Attending: Oncology | Admitting: Oncology

## 2021-09-20 ENCOUNTER — Other Ambulatory Visit: Payer: Self-pay | Admitting: Oncology

## 2021-09-20 DIAGNOSIS — C50421 Malignant neoplasm of upper-outer quadrant of right male breast: Secondary | ICD-10-CM

## 2021-09-20 DIAGNOSIS — R922 Inconclusive mammogram: Secondary | ICD-10-CM | POA: Diagnosis not present

## 2021-09-20 DIAGNOSIS — Z170 Estrogen receptor positive status [ER+]: Secondary | ICD-10-CM

## 2021-09-20 DIAGNOSIS — Z853 Personal history of malignant neoplasm of breast: Secondary | ICD-10-CM | POA: Diagnosis not present

## 2021-10-03 ENCOUNTER — Encounter: Payer: Self-pay | Admitting: Oncology

## 2021-10-03 ENCOUNTER — Other Ambulatory Visit: Payer: PPO

## 2021-10-04 ENCOUNTER — Encounter: Payer: Self-pay | Admitting: *Deleted

## 2021-10-11 ENCOUNTER — Other Ambulatory Visit: Payer: PPO

## 2021-10-14 DIAGNOSIS — H5213 Myopia, bilateral: Secondary | ICD-10-CM | POA: Diagnosis not present

## 2021-10-14 DIAGNOSIS — H40013 Open angle with borderline findings, low risk, bilateral: Secondary | ICD-10-CM | POA: Diagnosis not present

## 2021-10-14 DIAGNOSIS — H2513 Age-related nuclear cataract, bilateral: Secondary | ICD-10-CM | POA: Diagnosis not present

## 2021-10-14 DIAGNOSIS — H25043 Posterior subcapsular polar age-related cataract, bilateral: Secondary | ICD-10-CM | POA: Diagnosis not present

## 2021-10-16 ENCOUNTER — Other Ambulatory Visit: Payer: Self-pay | Admitting: Cardiovascular Disease

## 2021-10-16 DIAGNOSIS — I480 Paroxysmal atrial fibrillation: Secondary | ICD-10-CM

## 2021-10-17 NOTE — Telephone Encounter (Signed)
Prescription refill request for Eliquis received. Indication: afib  Last office visit: [name], 11/08/2020 Scr: 1.35, 07/09/2021 Age: 76 yo  Weight: 77.1 kg   Refill sent.

## 2021-10-24 DIAGNOSIS — C50921 Malignant neoplasm of unspecified site of right male breast: Secondary | ICD-10-CM | POA: Diagnosis not present

## 2021-10-24 DIAGNOSIS — Z79810 Long term (current) use of selective estrogen receptor modulators (SERMs): Secondary | ICD-10-CM | POA: Diagnosis not present

## 2021-10-24 DIAGNOSIS — N1831 Chronic kidney disease, stage 3a: Secondary | ICD-10-CM | POA: Diagnosis not present

## 2021-12-08 IMAGING — CT CT ANGIO CHEST
2 of 6 series · 19 of 36 positions shown · IV contrast (omnipaque)
Comparison: Chest radiograph earlier today.  No prior CT.

CLINICAL DATA: Chest pain. Atrial fibrillation. PE suspected. Right
mastectomy for male breast cancer

EXAM:
CT ANGIOGRAPHY CHEST WITH CONTRAST
TECHNIQUE: Multidetector CT imaging of the chest was performed using the
standard protocol during bolus administration of intravenous
contrast. Multiplanar CT image reconstructions and MIPs were
obtained to evaluate the vascular anatomy.
CONTRAST:  100mL OMNIPAQUE IOHEXOL 350 MG/ML SOLN

[Series 7: pe thins · axial · 0.89mm/px · z∈[+946,+1191]mm · 18 of 390 slices shown]
[im 20/390  lung]
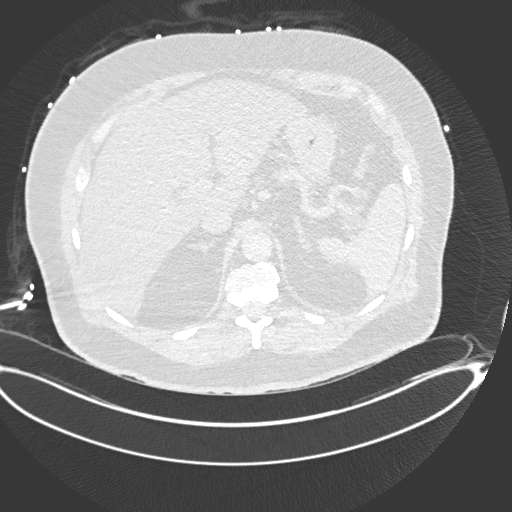
[im 39/390  mediastinal]
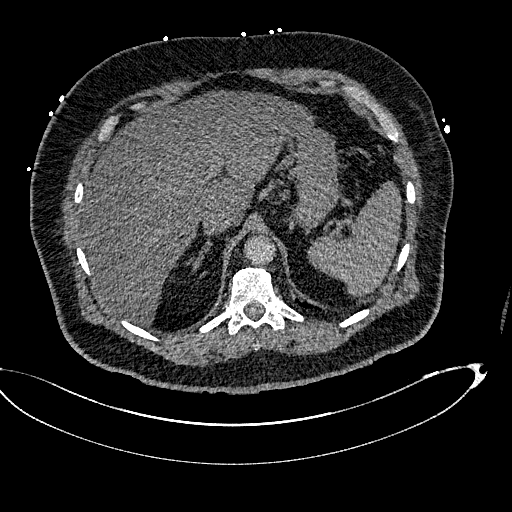
[im 59/390  lung]
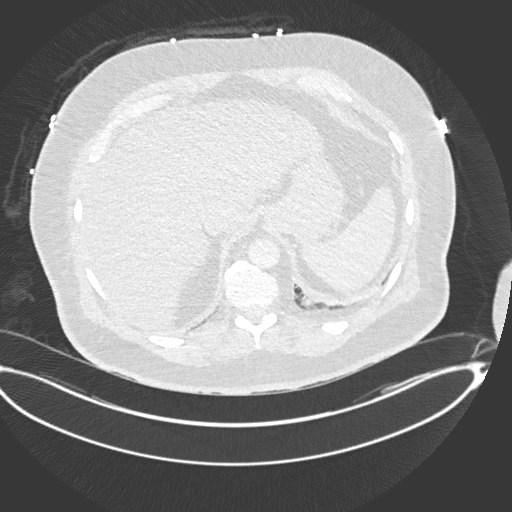
[im 78/390  mediastinal]
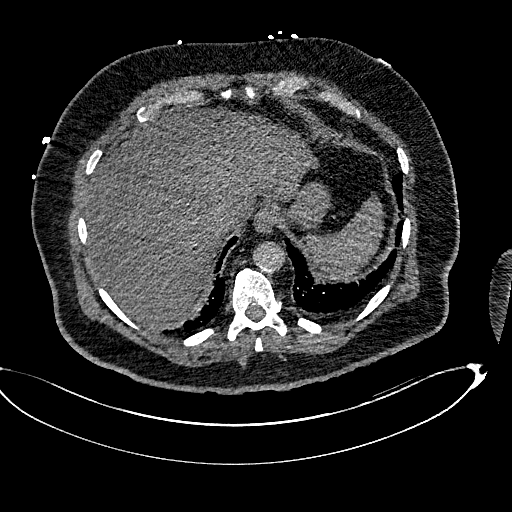
[im 98/390  lung]
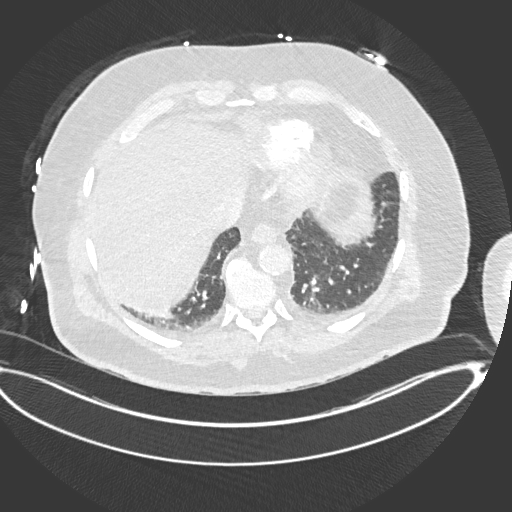
[im 117/390  mediastinal]
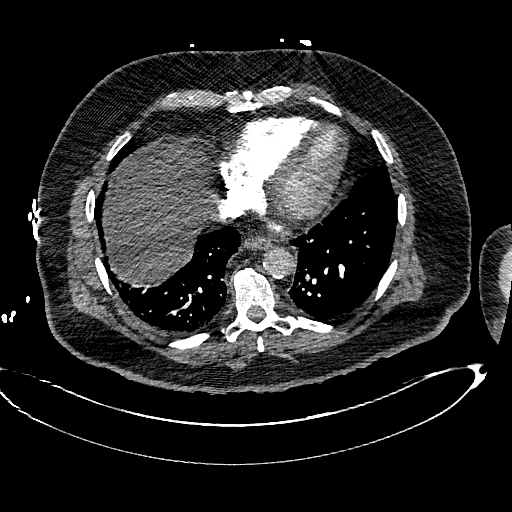
[im 137/390  lung]
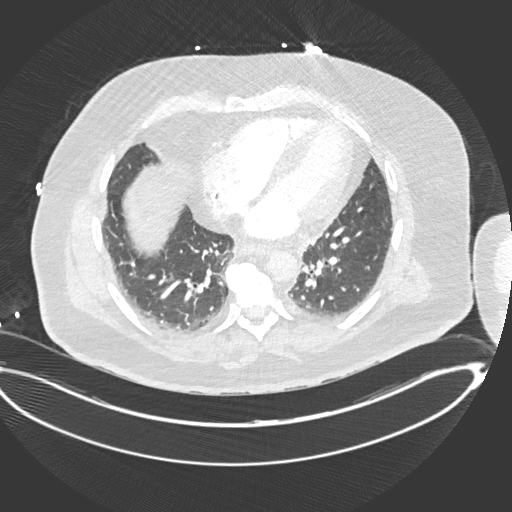
[im 156/390  mediastinal]
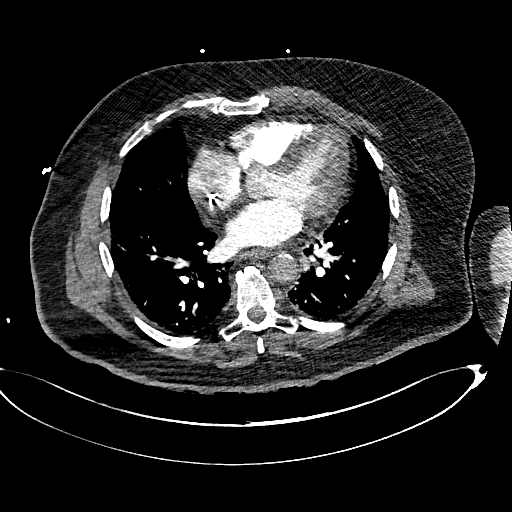
[im 176/390  lung]
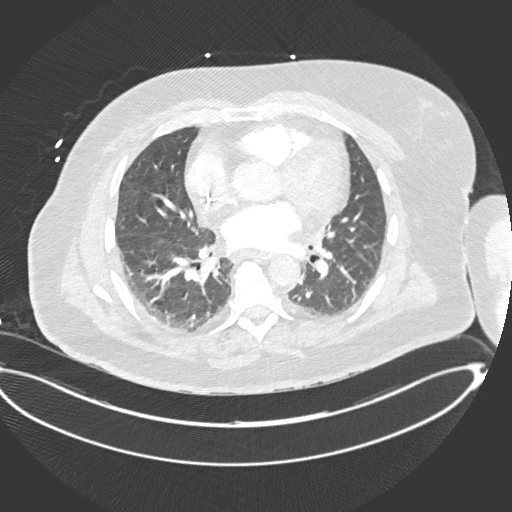
[im 214/390  mediastinal]
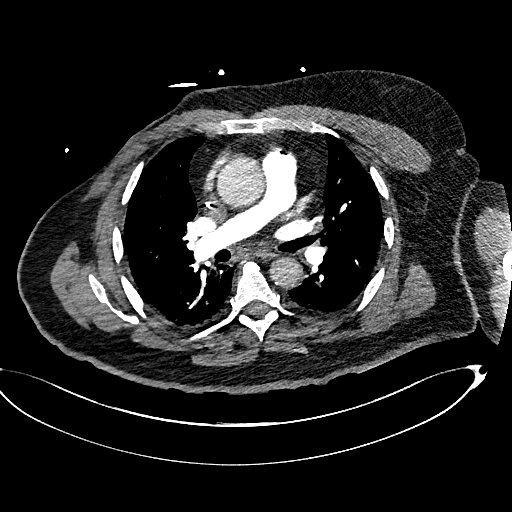
[im 234/390  lung]
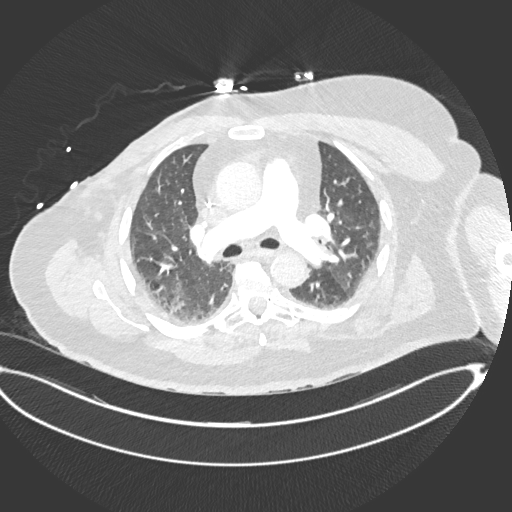
[im 253/390  mediastinal]
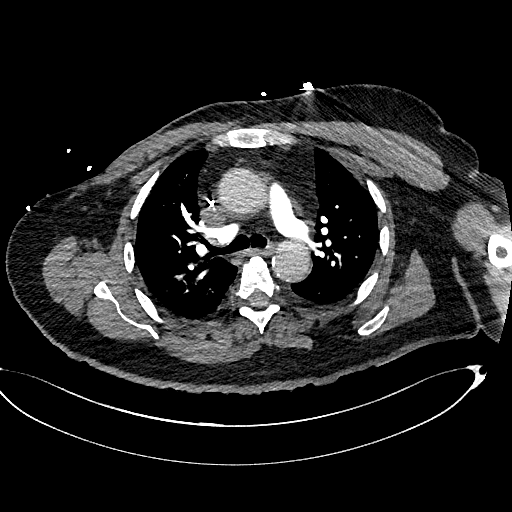
[im 273/390  lung]
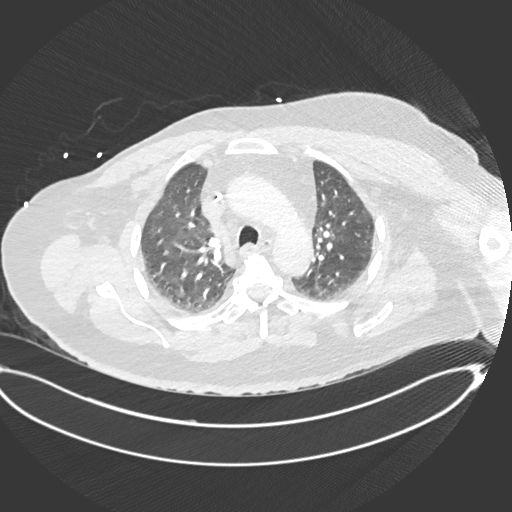
[im 292/390  mediastinal]
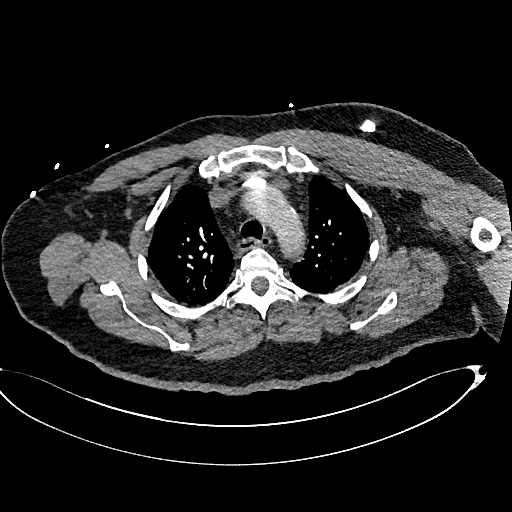
[im 312/390  lung]
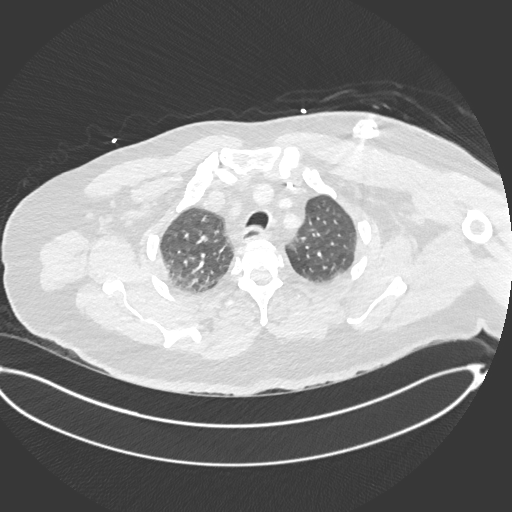
[im 331/390  mediastinal]
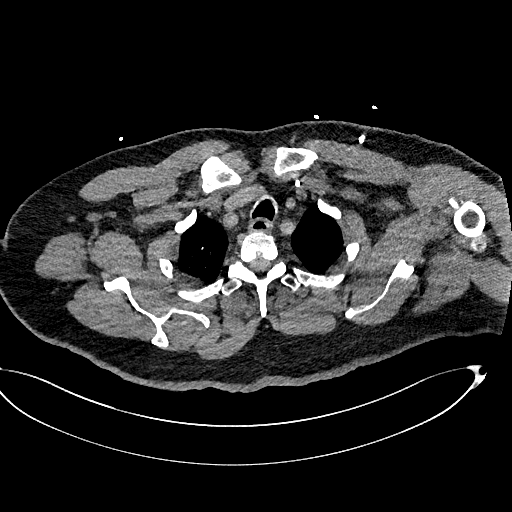
[im 351/390  lung]
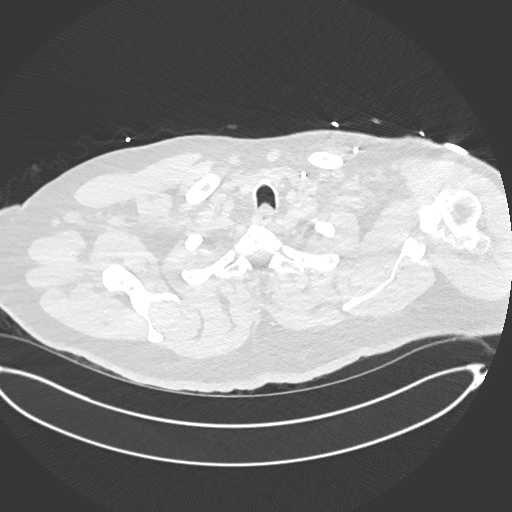
[im 370/390  mediastinal]
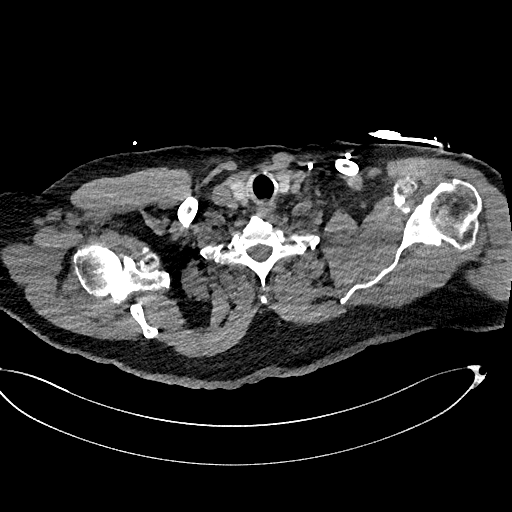

[Series 8: pe 2mm cor · coronal · 0.62mm/px · 1 of 151 slices shown]
[im 76/151  mediastinal]
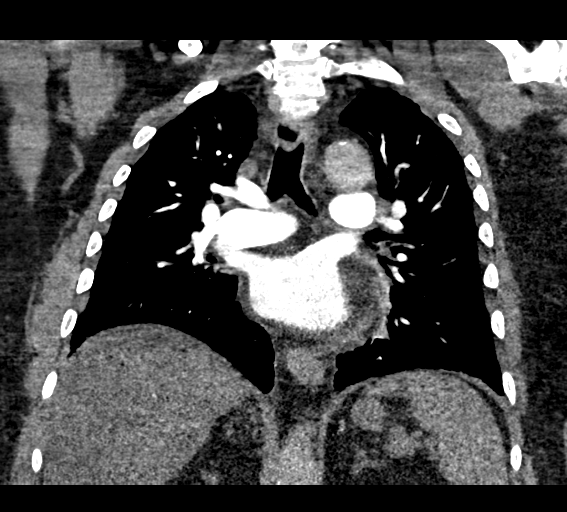

[19 of 36 positions shown; findings below may reference images not displayed]

FINDINGS: Cardiovascular: The quality of this exam for evaluation of pulmonary
embolism is good. The bolus is well timed. Mild limitations
secondary to patient body habitus and left arm position, not raised
above the head.

No evidence of pulmonary embolism.

Left-sided Port-A-Cath tip at mid right atrium. Aortic
atherosclerosis. Tortuous thoracic aorta. Mild cardiomegaly.
Proximal LAD and mid right coronary artery calcification.

Mediastinum/Nodes: No supraclavicular adenopathy. No axillary
adenopathy. No mediastinal or hilar adenopathy.

Lungs/Pleura: No pleural fluid.  Clear lungs.

Upper Abdomen: Hepatic steatosis. Normal imaged portions of the
spleen, pancreas, adrenal glands. Proximal gastric underdistention.
Right mastectomy

Musculoskeletal: Probable left-sided gynecomastia. Degenerative
changes of both shoulders. Moderate thoracic spondylosis.

Review of the MIP images confirms the above findings.
IMPRESSION: 1. No evidence of pulmonary embolism. Mild limitations secondary to
patient body habitus and left arm position.
2. Hepatic steatosis.
3. Right mastectomy, without findings of metastatic disease in the
chest.
4. Aortic Atherosclerosis ([id number]). Coronary artery
atherosclerosis.

## 2021-12-09 ENCOUNTER — Other Ambulatory Visit: Payer: Self-pay | Admitting: Cardiovascular Disease

## 2021-12-11 ENCOUNTER — Encounter: Payer: Self-pay | Admitting: Cardiovascular Disease

## 2021-12-11 MED ORDER — DILTIAZEM HCL ER COATED BEADS 120 MG PO CP24: 120.0000 mg | ORAL_CAPSULE | Freq: Every day | ORAL | 0 refills | Status: DC

## 2022-01-27 ENCOUNTER — Ambulatory Visit: Payer: PPO | Admitting: Cardiovascular Disease

## 2022-02-03 ENCOUNTER — Ambulatory Visit: Payer: PPO | Admitting: Dermatology

## 2022-02-10 ENCOUNTER — Encounter: Payer: Self-pay | Admitting: Cardiovascular Disease

## 2022-02-10 ENCOUNTER — Ambulatory Visit: Payer: PPO | Admitting: Cardiovascular Disease

## 2022-02-10 ENCOUNTER — Encounter: Payer: Self-pay | Admitting: Oncology

## 2022-02-10 VITALS — BP 128/82 | HR 56 | Ht 67.0 in | Wt 175.6 lb

## 2022-02-10 DIAGNOSIS — I480 Paroxysmal atrial fibrillation: Secondary | ICD-10-CM | POA: Diagnosis not present

## 2022-02-10 DIAGNOSIS — E782 Mixed hyperlipidemia: Secondary | ICD-10-CM | POA: Diagnosis not present

## 2022-02-10 NOTE — Patient Instructions (Signed)
Medication Instructions:  ?Your physician recommends that you continue on your current medications as directed. Please refer to the Current Medication list given to you today. ? ?*If you need a refill on your cardiac medications before your next appointment, please call your pharmacy* ? ? ?Lab Work: ?None ?If you have labs (blood work) drawn today and your tests are completely normal, you will receive your results only by: ?MyChart Message (if you have MyChart) OR ?A paper copy in the mail ?If you have any lab test that is abnormal or we need to change your treatment, we will call you to review the results. ? ? ?Follow-Up: ?At [facility], you and your health needs are our priority.  As part of our continuing mission to provide you with exceptional heart care, we have created designated Provider Care Teams.  These Care Teams include your primary Cardiologist (physician) and Advanced Practice Providers (APPs -  Physician Assistants and Nurse Practitioners) who all work together to provide you with the care you need, when you need it. ? ? ?Your next appointment:   ?1 year(s) ? ?The format for your next appointment:   ?In Person ? ?Provider:   ?Mertie Moores, MD { ?

## 2022-02-10 NOTE — Progress Notes (Signed)
?Cardiology Office Note:   ? ?Date:  02/10/2022  ? ?ID:  Logan Wright, DOB 04-Nov-1945, MRN [id number] ? ?PCP:  [name], MD  ?Cardiologist:  Mertie Moores, MD  ?Electrophysiologist:  None  ? ?Referring MD: [name], MD  ? ?Chief Complaint  ?Patient presents with  ? Hypertension  ?   ?  ? Atrial Fibrillation  ?   ?  ?  ? ?Feb. 10, 2020   ? ?Logan Wright is a 76 y.o. male with a hx of hypertension and atypical chest pain. ? ?Asked to see him today for preoperative evaluation  By Dr. Alvan Dame prior to left total knee replacement. ? ?Has seen Dr. Tamala Julian in the past ( at his request)  ?Now he was told that he needs cardiac clearence.  ? ?Exercises regularly .   Aerobic and weight lifting . ?Does a boot camp several times a week.  He averages exercising 5 times a week. ?Recently has been having some left knee problems so he is been cycling more.  Not any chest pain or shortness of breath while exercising. ? ?No syncope or presyncope. ?He has held his aspirin for the past 4 days. ?Has also been taking Turmeric.    ? 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  ? ?Jan. 27, 2022: ?Logan Wright is seen back today for follow up of his HTN and atrial fib.   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  ?Was diagnosed with atrial fib .   Was treated with rate control and anticoagulation.  He did not have a cardioversion but converted on his own to NSR  ? ?We had referred him for a sleep study - we had tried to refer him to Dr. Radford Pax .   But he never heard back from her .  ?He has contacted Carlena Sax and will be doing the home sleep study.  ?Has lost 30 lbs . ?Does not have any questions  ? ?CHADS2VASC is 64 ( age, HTN)  ? ? ?Seen back today for follow-up of his hypertension and atrial fibrillation.  He status post mastectomy for right breast cancer. ? ?Getting lots of exercise  ? ?Hiked out in [place] recently . [place] .  ?No cp, no dyspnea. ?Lipids from his primary MD look  good ?LDL = 78 ?HDL = 42 ?Trigs = 93 ?Chol = 138 ? ? ? ?Past Medical History:  ?Diagnosis Date  ? Ascending aorta dilatation (HCC)   ? Breast cancer ([place]) 2021  ? right breast  ? Breast cancer, right ([place])   ? CKD (chronic kidney disease), stage III ([place])   ? Colon adenoma 2015  ? Coronary artery calcification seen on CT scan   ? Diverticulosis   ? DENIES   ? DJD (degenerative joint disease)   ? ED (erectile dysfunction)   ? Elevated blood pressure 2007-2008  ? Esophageal reflux   ? GERD (gastroesophageal reflux disease)   ? Gout   ? Gynecomastia   ? LEFT  ? Hearing loss   ? HTN (hypertension)   ? Hypercholesteremia   ? Non-cardiac chest pain   ? OA (osteoarthritis)   ? LEFT SHOULDER  ? Onychomycosis of foot with other complication   ? PAF (paroxysmal atrial fibrillation) ([place])   ? Pre-diabetes   ? Seborrhea   ? Severe frontal headaches   ? RESOLVED   ? Vertigo   ? RESOLVED   ?  Wears glasses   ? Wears hearing aid   ? ? ?Past Surgical History:  ?Procedure Laterality Date  ? COLONSCOPY     ? HIP ARTHROPLASTY Bilateral   ? AT [facility]   ? IR CV LINE INJECTION  05/14/2020  ? IR IMAGING GUIDED PORT INSERTION  05/17/2020  ? IR REMOVAL TUN ACCESS W/ PORT W/O FL MOD SED  05/17/2020  ? KNEE ARTHROSCOPY    ? UNSURE WHICH KNEE   ? MASTECTOMY Right 03/16/2020  ? MASTECTOMY W/ SENTINEL NODE BIOPSY Right 03/16/2020  ? Procedure: RIGHT BREAST MASTECTOMY WITH SENTINEL LYMPH NODE BIOPSY;  Surgeon: [name], MD;  Location: [facility];  Service: General;  Laterality: Right;  ? PORTACATH PLACEMENT Left 05/08/2020  ? Procedure: INSERTION PORT-A-CATH WITH ULTRASOUND;  Surgeon: [name], MD;  Location: [facility];  Service: General;  Laterality: Left;  LMA  ? TOTAL KNEE ARTHROPLASTY Left 11/23/2018  ? Procedure: TOTAL KNEE ARTHROPLASTY;  Surgeon: Paralee Cancel, MD;  Location: [facility];  Service: Orthopedics;  Laterality: Left;  70 mins  ? ? ?Current Medications: ?Current Meds  ?Medication Sig  ? allopurinol  (ZYLOPRIM) 100 MG tablet Take 200 mg by mouth daily.  ? apixaban (ELIQUIS) 5 MG TABS tablet Take 1 tablet by mouth twice daily  ? atorvastatin (LIPITOR) 40 MG tablet Take 40 mg by mouth at bedtime.   ? diltiazem (CARDIZEM CD) 120 MG 24 hr capsule Take 1 capsule (120 mg total) by mouth daily. Keep follow up appointment for furt her refills.  ? GLUCOSAMINE-CHONDROITIN PO Take 2 tablets by mouth daily.  ? Multiple Vitamin (MULTIVITAMIN) tablet Take 1 tablet by mouth daily.   ? Omega-3 Fatty Acids (FISH OIL ULTRA) 1400 MG CAPS Take 1,400-2,800 mg by mouth See admin instructions. Take 1400 mg in the morning and 2800 mg in the evening  ? Potassium 99 MG TABS Take 1 tablet by mouth daily.  ? tamoxifen (NOLVADEX) 20 MG tablet Take 20 mg by mouth daily.  ?  ? ?Allergies:   Patient has no known allergies.  ? ?Social History  ? ?Socioeconomic History  ? Marital status: Married  ?  Spouse name: Not on file  ? Number of children: Not on file  ? Years of education: Not on file  ? Highest education level: Not on file  ?Occupational History  ? Not on file  ?Tobacco Use  ? Smoking status: Never  ? Smokeless tobacco: Never  ?Vaping Use  ? Vaping Use: Never used  ?Substance and Sexual Activity  ? Alcohol use: No  ?  Alcohol/week: 0.0 standard drinks  ? Drug use: No  ? Sexual activity: Not on file  ?Other Topics Concern  ? Not on file  ?Social History Narrative  ? Not on file  ? ?Social Determinants of Health  ? ?Financial Resource Strain: Not on file  ?Food Insecurity: Not on file  ?Transportation Needs: Not on file  ?Physical Activity: Not on file  ?Stress: Not on file  ?Social Connections: Not on file  ?  ? ?Family History: ?The patient's family history includes Heart attack in his mother; Heart failure in his father; Stroke in his mother. There is no history of Hypertension. ? ?ROS:   ?Please see the history of present illness.    ? All other systems reviewed and are negative. ? ?EKGs/Labs/Other Studies Reviewed:   ? ?The following  studies were reviewed today: ? ? ?EKG:    Feb 10, 2022: Sinus bradycardia 56.  Otherwise normal EKG. ? ?Recent Labs: ?07/09/2021: ALT 37; BUN 23; Creatinine, Ser 1.35; Hemoglobin 14.0; Platelets 177; Potassium 4.6; Sodium 142  ?Recent Lipid Panel ?No results found for: CHOL, TRIG, HDL, CHOLHDL, VLDL, LDLCALC, LDLDIRECT ? ?Physical Exam:   ? ?Physical Exam: ?Blood pressure 128/82, pulse (!) 56, height '5\' 7"'$  (1.702 m), weight 175 lb 9.6 oz (79.7 kg), SpO2 97 %. ? ?GEN:  Well nourished, well developed in no acute distress ?HEENT: Normal ?NECK: No JVD; No carotid bruits ?LYMPHATICS: No lymphadenopathy ?CARDIAC: RRR , no murmurs, rubs, gallops ?RESPIRATORY:  Clear to auscultation without rales, wheezing or rhonchi  ?ABDOMEN: Soft, non-tender, non-distended ?MUSCULOSKELETAL:  No edema; No deformity  ?SKIN: Warm and dry ?NEUROLOGIC:  Alert and oriented x 3 ? ? ?ASSESSMENT:   ? ?1. PAF (paroxysmal atrial fibrillation) ([place])   ?2. Mixed hyperlipidemia   ? ?PLAN:   ? ? ? ? HTN:     BP is well controlled.  ? ?2.  Atrial fib :   PAF ,  ?Continue eliquis.   His Afib may have been brought on by his chemo but I dont want to take a chance on a stroke . ?He is tolerating the eliquis quite well.  ? ? ?Medication Adjustments/Labs and Tests Ordered: ?Current medicines are reviewed at length with the patient today.  Concerns regarding medicines are outlined above.  ?Orders Placed This Encounter  ?Procedures  ? EKG 12-Lead  ? ?No orders of the defined types were placed in this encounter. ? ? ?Patient Instructions  ?Medication Instructions:  ?Your physician recommends that you continue on your current medications as directed. Please refer to the Current Medication list given to you today. ? ?*If you need a refill on your cardiac medications before your next appointment, please call your pharmacy* ? ? ?Lab Work: ?None ?If you have labs (blood work) drawn today and your tests are completely normal, you will receive your results only  by: ?MyChart Message (if you have MyChart) OR ?A paper copy in the mail ?If you have any lab test that is abnormal or we need to change your treatment, we will call you to review the results. ? ? ?Follow-Up: ?At

## 2022-02-12 ENCOUNTER — Encounter: Payer: Self-pay | Admitting: Dermatology

## 2022-02-12 ENCOUNTER — Ambulatory Visit: Payer: PPO | Admitting: Dermatology

## 2022-02-12 DIAGNOSIS — L821 Other seborrheic keratosis: Secondary | ICD-10-CM | POA: Diagnosis not present

## 2022-02-12 DIAGNOSIS — L918 Other hypertrophic disorders of the skin: Secondary | ICD-10-CM

## 2022-02-12 DIAGNOSIS — Z1283 Encounter for screening for malignant neoplasm of skin: Secondary | ICD-10-CM | POA: Diagnosis not present

## 2022-02-24 DIAGNOSIS — N183 Chronic kidney disease, stage 3 unspecified: Secondary | ICD-10-CM | POA: Diagnosis not present

## 2022-02-24 DIAGNOSIS — C50921 Malignant neoplasm of unspecified site of right male breast: Secondary | ICD-10-CM | POA: Diagnosis not present

## 2022-02-24 DIAGNOSIS — I700 Atherosclerosis of aorta: Secondary | ICD-10-CM | POA: Diagnosis not present

## 2022-02-24 DIAGNOSIS — R809 Proteinuria, unspecified: Secondary | ICD-10-CM | POA: Diagnosis not present

## 2022-02-24 DIAGNOSIS — I4891 Unspecified atrial fibrillation: Secondary | ICD-10-CM | POA: Diagnosis not present

## 2022-02-24 DIAGNOSIS — M109 Gout, unspecified: Secondary | ICD-10-CM | POA: Diagnosis not present

## 2022-02-24 DIAGNOSIS — Z7901 Long term (current) use of anticoagulants: Secondary | ICD-10-CM | POA: Diagnosis not present

## 2022-02-24 DIAGNOSIS — N3941 Urge incontinence: Secondary | ICD-10-CM | POA: Diagnosis not present

## 2022-02-24 DIAGNOSIS — R7401 Elevation of levels of liver transaminase levels: Secondary | ICD-10-CM | POA: Diagnosis not present

## 2022-02-24 DIAGNOSIS — I10 Essential (primary) hypertension: Secondary | ICD-10-CM | POA: Diagnosis not present

## 2022-02-24 DIAGNOSIS — R739 Hyperglycemia, unspecified: Secondary | ICD-10-CM | POA: Diagnosis not present

## 2022-02-24 DIAGNOSIS — E663 Overweight: Secondary | ICD-10-CM | POA: Diagnosis not present

## 2022-02-27 DIAGNOSIS — I700 Atherosclerosis of aorta: Secondary | ICD-10-CM | POA: Diagnosis not present

## 2022-02-27 DIAGNOSIS — R739 Hyperglycemia, unspecified: Secondary | ICD-10-CM | POA: Diagnosis not present

## 2022-02-27 DIAGNOSIS — M109 Gout, unspecified: Secondary | ICD-10-CM | POA: Diagnosis not present

## 2022-02-27 DIAGNOSIS — N183 Chronic kidney disease, stage 3 unspecified: Secondary | ICD-10-CM | POA: Diagnosis not present

## 2022-02-27 DIAGNOSIS — R809 Proteinuria, unspecified: Secondary | ICD-10-CM | POA: Diagnosis not present

## 2022-02-27 DIAGNOSIS — Z7901 Long term (current) use of anticoagulants: Secondary | ICD-10-CM | POA: Diagnosis not present

## 2022-03-02 ENCOUNTER — Encounter: Payer: Self-pay | Admitting: Dermatology

## 2022-03-02 NOTE — Progress Notes (Signed)
   Follow-Up Visit   Subjective  Logan Wright is a 76 y.o. male who presents for the following: Annual Exam (No new concerns).  Annual skin examination, several spots to check Location:  Duration:  Quality:  Associated Signs/Symptoms: Modifying Factors:  Severity:  Timing: Context:   Objective  Well appearing patient in no apparent distress; mood and affect are within normal limits. Full body  skin examination: No atypical pigmented lesions (all checked with dermoscopy), no nonmelanoma skin cancer  Multiple 4 to 15 mm brown textured flattopped papules  Left Axilla, Right Axilla Multiple flesh-colored pedunculated 2 to 4 mm papules    A full examination was performed including scalp, head, eyes, ears, nose, lips, neck, chest, axillae, abdomen, back, buttocks, bilateral upper extremities, bilateral lower extremities, hands, feet, fingers, toes, fingernails, and toenails. All findings within normal limits unless otherwise noted below.   Assessment & Plan    Encounter for screening for malignant neoplasm of skin  Annual skin examination.  Encouraged to self examine with spouse twice annually.  Seborrheic keratosis  Leave if stable  Skin tags, multiple acquired (2) Left Axilla; Right Axilla  May choose to remove in future      I, Lavonna Monarch, MD, have reviewed all documentation for this visit.  The documentation on 03/02/22 for the exam, diagnosis, procedures, and orders are all accurate and complete.

## 2022-03-24 ENCOUNTER — Other Ambulatory Visit: Payer: Self-pay | Admitting: Cardiovascular Disease

## 2022-03-27 ENCOUNTER — Other Ambulatory Visit: Payer: Self-pay | Admitting: Cardiovascular Disease

## 2022-03-27 DIAGNOSIS — I480 Paroxysmal atrial fibrillation: Secondary | ICD-10-CM

## 2022-03-27 NOTE — Telephone Encounter (Signed)
Pt last saw Dr Acie Fredrickson 02/10/22, last labs 07/09/21 Creat 1.35, age 76, weight 79.7kg, based on specified criteria pt is on appropriate dosage of Eliquis '5mg'$  BID for afib.  Will refill rx.

## 2022-03-31 DIAGNOSIS — R809 Proteinuria, unspecified: Secondary | ICD-10-CM | POA: Diagnosis not present

## 2022-03-31 DIAGNOSIS — N183 Chronic kidney disease, stage 3 unspecified: Secondary | ICD-10-CM | POA: Diagnosis not present

## 2022-04-14 ENCOUNTER — Other Ambulatory Visit: Payer: Self-pay | Admitting: Gastroenterology

## 2022-04-14 DIAGNOSIS — R1310 Dysphagia, unspecified: Secondary | ICD-10-CM

## 2022-04-14 DIAGNOSIS — R1319 Other dysphagia: Secondary | ICD-10-CM | POA: Diagnosis not present

## 2022-04-17 ENCOUNTER — Ambulatory Visit
Admission: RE | Admit: 2022-04-17 | Discharge: 2022-04-17 | Disposition: A | Discharge status: Home / Self Care | Payer: PPO | Source: Ambulatory Visit | Attending: Gastroenterology | Admitting: Gastroenterology

## 2022-04-17 DIAGNOSIS — K224 Dyskinesia of esophagus: Secondary | ICD-10-CM | POA: Diagnosis not present

## 2022-04-17 DIAGNOSIS — K449 Diaphragmatic hernia without obstruction or gangrene: Secondary | ICD-10-CM | POA: Diagnosis not present

## 2022-04-17 DIAGNOSIS — R1310 Dysphagia, unspecified: Secondary | ICD-10-CM | POA: Diagnosis not present

## 2022-04-21 DIAGNOSIS — H40013 Open angle with borderline findings, low risk, bilateral: Secondary | ICD-10-CM | POA: Diagnosis not present

## 2022-04-24 DIAGNOSIS — C50921 Malignant neoplasm of unspecified site of right male breast: Secondary | ICD-10-CM | POA: Diagnosis not present

## 2022-04-24 DIAGNOSIS — D692 Other nonthrombocytopenic purpura: Secondary | ICD-10-CM | POA: Diagnosis not present

## 2022-06-27 DIAGNOSIS — M25511 Pain in right shoulder: Secondary | ICD-10-CM | POA: Diagnosis not present

## 2022-07-03 ENCOUNTER — Encounter: Payer: Self-pay | Admitting: Oncology

## 2022-07-04 DIAGNOSIS — M25511 Pain in right shoulder: Secondary | ICD-10-CM | POA: Diagnosis not present

## 2022-07-07 NOTE — Progress Notes (Signed)
Patient Care Team: Logan Shanks, MD (Inactive) as PCP - General (Family Medicine) [name], [name], MD as PCP - Cardiology (Cardiology) Magrinat, [name], MD (Inactive) as Consulting Physician (Oncology) Logan Keens, MD as Consulting Physician (General Surgery) Logan Rudd, MD as Consulting Physician (Radiation Oncology) Logan Monarch, MD (Inactive) as Consulting Physician (Dermatology) Logan Cancel, MD as Consulting Physician (Orthopedic Surgery) Logan Kaufmann, RN as Oncology Nurse Navigator Logan Germany, RN as Oncology Nurse Navigator  DIAGNOSIS: No diagnosis found.  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  The Common Hereditary Gene Panel offered by [facility] includes sequencing and/or deletion duplication testing of the following 48 genes: APC, ATM, AXIN2, BARD1, BMPR1A, BRCA1, BRCA2, BRIP1, CDH1, CDK4, CDKN2A (p14ARF), CDKN2A (p16INK4a), CHEK2, CTNNA1, DICER1, EPCAM (Deletion/duplication testing only), GREM1 (promoter region deletion/duplication testing only), KIT, MEN1, MLH1, MSH2, MSH3, MSH6, MUTYH, NBN, NF1, NHTL1, PALB2, PDGFRA, PMS2, POLD1, POLE, PTEN, RAD50, RAD51C, RAD51D, RNF43, SDHB, SDHC, SDHD, SMAD4, SMARCA4. STK11, TP53, TSC1, TSC2, and VHL.  The following genes were evaluated for sequence changes only: SDHA and HOXB13 c.251G>A variant only. The report date is 05/14/2020.   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 with the above mentioned. She presents to the clinic for a follow-up.    ALLERGIES:  has No Known Allergies.  MEDICATIONS:  Current Outpatient Medications  Medication Sig Dispense Refill   allopurinol (ZYLOPRIM) 100 MG tablet Take 200 mg by mouth daily.     apixaban (ELIQUIS) 5 MG TABS tablet Take 1 tablet by mouth twice daily 180 tablet 1   atorvastatin (LIPITOR) 40 MG tablet Take 40 mg by mouth at bedtime.      diltiazem (CARDIZEM CD) 120 MG 24 hr capsule TAKE 1 CAPSULE BY MOUTH ONCE DAILY . APPOINTMENT REQUIRED FOR FUTURE REFILLS 90 capsule 3   GLUCOSAMINE-CHONDROITIN PO Take 2 tablets by mouth daily.     Multiple Vitamin (MULTIVITAMIN) tablet Take 1 tablet by mouth daily.      Omega-3 Fatty Acids (FISH OIL ULTRA) 1400 MG CAPS Take 1,400-2,800 mg by mouth See admin instructions. Take 1400 mg in the morning and 2800 mg in the evening     Potassium 99 MG TABS Take 1 tablet by mouth daily.     tamoxifen (NOLVADEX) 20 MG tablet Take 20 mg by mouth daily.     No current facility-administered medications for this visit.    PHYSICAL EXAMINATION: ECOG PERFORMANCE STATUS: {CHL ONC ECOG PS:[id number]}  There were no vitals filed for this visit. There were no vitals filed for this visit.  BREAST:*** No palpable masses or nodules in either right or left breasts. No palpable axillary supraclavicular or infraclavicular adenopathy no breast tenderness or nipple discharge. (exam performed in the presence of  a chaperone)  LABORATORY DATA:  I have reviewed the data as listed    Latest Ref Rng & Units 07/09/2021    2:28 PM 12/20/2020    1:02 PM 10/11/2020   10:26 AM  CMP  Glucose 70 - 99 mg/dL 89  100  96   BUN 8 - 23 mg/dL 23  28  24    Creatinine 0.61 - 1.24 mg/dL 1.35   1.22  1.25   Sodium 135 - 145 mmol/L 142  137  141   Potassium 3.5 - 5.1 mmol/L 4.6  4.1  4.0   Chloride 98 - 111 mmol/L 108  106  108   CO2 22 - 32 mmol/L 26  25  26    Calcium 8.9 - 10.3 mg/dL 9.1  8.9  9.2   Total Protein 6.5 - 8.1 g/dL 6.2  6.4  6.5   Total Bilirubin 0.3 - 1.2 mg/dL 0.5  0.6  0.5   Alkaline Phos 38 - 126 U/L 92  59  73   AST 15 - 41 U/L 29  36  34   ALT 0 - 44 U/L 37  47  36     Lab Results  Component Value Date   WBC 7.3 07/09/2021   HGB 14.0 07/09/2021   HCT 42.6 07/09/2021   MCV 86.4 07/09/2021   PLT 177 07/09/2021   NEUTROABS 4.4 07/09/2021    ASSESSMENT & PLAN:  No problem-specific Assessment & Plan notes found for this encounter.    No orders of the defined types were placed in this encounter.  The patient has a good understanding of the overall plan. he agrees with it. he will call with any problems that may develop before the next visit here. Total time spent: 30 mins including face to face time and time spent for planning, charting and co-ordination of care   Logan Wright, [place] 07/07/22    I Logan Wright am scribing for Dr. Lindi Wright  ***

## 2022-07-09 ENCOUNTER — Other Ambulatory Visit: Payer: Self-pay | Admitting: *Deleted

## 2022-07-09 DIAGNOSIS — Z170 Estrogen receptor positive status [ER+]: Secondary | ICD-10-CM

## 2022-07-10 ENCOUNTER — Encounter: Payer: Self-pay | Admitting: Hematology and Oncology

## 2022-07-10 ENCOUNTER — Inpatient Hospital Stay: Payer: PPO | Attending: Hematology and Oncology

## 2022-07-10 ENCOUNTER — Inpatient Hospital Stay (HOSPITAL_BASED_OUTPATIENT_CLINIC_OR_DEPARTMENT_OTHER): Payer: PPO | Admitting: Hematology and Oncology

## 2022-07-10 VITALS — BP 143/80 | HR 71 | Temp 97.7°F | Resp 18 | Ht 67.0 in | Wt 176.8 lb

## 2022-07-10 DIAGNOSIS — Z79810 Long term (current) use of selective estrogen receptor modulators (SERMs): Secondary | ICD-10-CM | POA: Insufficient documentation

## 2022-07-10 DIAGNOSIS — Z170 Estrogen receptor positive status [ER+]: Secondary | ICD-10-CM

## 2022-07-10 DIAGNOSIS — C50421 Malignant neoplasm of upper-outer quadrant of right male breast: Secondary | ICD-10-CM

## 2022-07-10 LAB — CBC WITH DIFFERENTIAL (CANCER CENTER ONLY)
Abs Immature Granulocytes: 0.01 10*3/uL (ref 0.00–0.07)
Basophils Absolute: 0 10*3/uL (ref 0.0–0.1)
Basophils Relative: 1 %
Eosinophils Absolute: 0.1 10*3/uL (ref 0.0–0.5)
Eosinophils Relative: 1 %
HCT: 40.1 % (ref 39.0–52.0)
Hemoglobin: 13.5 g/dL (ref 13.0–17.0)
Immature Granulocytes: 0 %
Lymphocytes Relative: 28 %
Lymphs Abs: 1.9 10*3/uL (ref 0.7–4.0)
MCH: 29.3 pg (ref 26.0–34.0)
MCHC: 33.7 g/dL (ref 30.0–36.0)
MCV: 87 fL (ref 80.0–100.0)
Monocytes Absolute: 0.6 10*3/uL (ref 0.1–1.0)
Monocytes Relative: 9 %
Neutro Abs: 4.3 10*3/uL (ref 1.7–7.7)
Neutrophils Relative %: 61 %
Platelet Count: 218 10*3/uL (ref 150–400)
RBC: 4.61 MIL/uL (ref 4.22–5.81)
RDW: 13.9 % (ref 11.5–15.5)
WBC Count: 6.9 10*3/uL (ref 4.0–10.5)
nRBC: 0 % (ref 0.0–0.2)

## 2022-07-10 LAB — CMP (CANCER CENTER ONLY)
ALT: 23 U/L (ref 0–44)
AST: 28 U/L (ref 15–41)
Albumin: 3.7 g/dL (ref 3.5–5.0)
Alkaline Phosphatase: 69 U/L (ref 38–126)
Anion gap: 9 (ref 5–15)
BUN: 26 mg/dL — ABNORMAL HIGH (ref 8–23)
CO2: 25 mmol/L (ref 22–32)
Calcium: 8.7 mg/dL — ABNORMAL LOW (ref 8.9–10.3)
Chloride: 108 mmol/L (ref 98–111)
Creatinine: 1.14 mg/dL (ref 0.61–1.24)
GFR, Estimated: >60 mL/min (ref >60)
Glucose, Bld: 123 mg/dL — ABNORMAL HIGH (ref 70–99)
Potassium: 4 mmol/L (ref 3.5–5.1)
Sodium: 142 mmol/L (ref 135–145)
Total Bilirubin: 0.4 mg/dL (ref 0.3–1.2)
Total Protein: 6.3 g/dL — ABNORMAL LOW (ref 6.5–8.1)

## 2022-07-10 NOTE — Assessment & Plan Note (Addendum)
04/05/2020: Right mastectomy: T2N0 stage IIa grade 2 IDC with negative margins ER/PR positive HER2 negative Ki-67 15% 0/3 lymph nodes Oncotype score 40: Risk of distant recurrence: 28% 05/17/2020 10/11/2020: CMF x8 cycles 05/14/2020: Genetic testing: BRCA1 mutation  Current treatment: Tamoxifen started 10/30/2019 Tamoxifen toxicities: Tolerating it well without any problems or concerns.  Denies any hot flashes or arthralgias or myalgias.  Breast cancer surveillance: 1.  Breast exam 07/10/2022: Benign 2. mammogram and ultrasound 09/20/2021: Benign breast density category B  I reviewed his blood work today.    We discussed the pros and cons of doing [employer] testing.  He will research more about it and will inform us if he wants to pursue it.  Return to clinic in 1 year for follow-up

## 2022-07-11 DIAGNOSIS — M25511 Pain in right shoulder: Secondary | ICD-10-CM | POA: Diagnosis not present

## 2022-07-18 ENCOUNTER — Encounter: Payer: Self-pay | Admitting: Hematology and Oncology

## 2022-07-25 DIAGNOSIS — Z7184 Encounter for health counseling related to travel: Secondary | ICD-10-CM | POA: Diagnosis not present

## 2022-08-01 DIAGNOSIS — M25511 Pain in right shoulder: Secondary | ICD-10-CM | POA: Diagnosis not present

## 2022-09-10 DIAGNOSIS — I700 Atherosclerosis of aorta: Secondary | ICD-10-CM | POA: Diagnosis not present

## 2022-09-10 DIAGNOSIS — N183 Chronic kidney disease, stage 3 unspecified: Secondary | ICD-10-CM | POA: Diagnosis not present

## 2022-09-10 DIAGNOSIS — E782 Mixed hyperlipidemia: Secondary | ICD-10-CM | POA: Diagnosis not present

## 2022-09-10 DIAGNOSIS — I4891 Unspecified atrial fibrillation: Secondary | ICD-10-CM | POA: Diagnosis not present

## 2022-09-10 DIAGNOSIS — Z0000 Encounter for general adult medical examination without abnormal findings: Secondary | ICD-10-CM | POA: Diagnosis not present

## 2022-09-10 DIAGNOSIS — I10 Essential (primary) hypertension: Secondary | ICD-10-CM | POA: Diagnosis not present

## 2022-09-10 DIAGNOSIS — Z23 Encounter for immunization: Secondary | ICD-10-CM | POA: Diagnosis not present

## 2022-09-22 ENCOUNTER — Ambulatory Visit
Admission: RE | Admit: 2022-09-22 | Discharge: 2022-09-22 | Disposition: A | Discharge status: Home / Self Care | Payer: PPO | Source: Ambulatory Visit | Attending: Hematology and Oncology | Admitting: Hematology and Oncology

## 2022-09-22 DIAGNOSIS — Z1231 Encounter for screening mammogram for malignant neoplasm of breast: Secondary | ICD-10-CM | POA: Diagnosis not present

## 2022-09-22 DIAGNOSIS — Z170 Estrogen receptor positive status [ER+]: Secondary | ICD-10-CM

## 2022-11-06 ENCOUNTER — Other Ambulatory Visit: Payer: Self-pay | Admitting: Cardiovascular Disease

## 2022-11-06 DIAGNOSIS — I480 Paroxysmal atrial fibrillation: Secondary | ICD-10-CM

## 2022-11-07 NOTE — Telephone Encounter (Signed)
Prescription refill request for Eliquis received. Indication:afib Last office visit:5/23 Scr:1.1 Age: 77 Weight:80.2  kg  Prescription refilled

## 2022-11-11 ENCOUNTER — Encounter: Payer: Self-pay | Admitting: Hematology and Oncology

## 2022-11-11 ENCOUNTER — Other Ambulatory Visit: Payer: Self-pay | Admitting: *Deleted

## 2022-11-11 MED ORDER — TAMOXIFEN CITRATE 20 MG PO TABS: 20.0000 mg | ORAL_TABLET | Freq: Every day | ORAL | 3 refills | Status: DC

## 2023-01-23 ENCOUNTER — Encounter: Payer: Self-pay | Admitting: Cardiovascular Disease

## 2023-02-16 ENCOUNTER — Ambulatory Visit: Payer: PPO | Admitting: Dermatology

## 2023-03-30 ENCOUNTER — Ambulatory Visit: Payer: PPO | Attending: Physician Assistant | Admitting: Physician Assistant

## 2023-03-30 ENCOUNTER — Encounter: Payer: Self-pay | Admitting: Physician Assistant

## 2023-03-30 ENCOUNTER — Other Ambulatory Visit: Payer: Self-pay | Admitting: *Deleted

## 2023-03-30 VITALS — BP 120/68 | HR 58 | Ht 67.0 in | Wt 175.8 lb

## 2023-03-30 DIAGNOSIS — I10 Essential (primary) hypertension: Secondary | ICD-10-CM

## 2023-03-30 DIAGNOSIS — I77819 Aortic ectasia, unspecified site: Secondary | ICD-10-CM | POA: Diagnosis not present

## 2023-03-30 DIAGNOSIS — I77810 Thoracic aortic ectasia: Secondary | ICD-10-CM | POA: Insufficient documentation

## 2023-03-30 DIAGNOSIS — E7800 Pure hypercholesterolemia, unspecified: Secondary | ICD-10-CM | POA: Diagnosis not present

## 2023-03-30 DIAGNOSIS — I2510 Atherosclerotic heart disease of native coronary artery without angina pectoris: Secondary | ICD-10-CM | POA: Insufficient documentation

## 2023-03-30 DIAGNOSIS — I480 Paroxysmal atrial fibrillation: Secondary | ICD-10-CM | POA: Diagnosis not present

## 2023-03-30 HISTORY — DX: Thoracic aortic ectasia: I77.810

## 2023-03-30 MED ORDER — APIXABAN 5 MG PO TABS: 5.0000 mg | ORAL_TABLET | Freq: Two times a day (BID) | ORAL | 0 refills | Status: DC

## 2023-03-30 NOTE — Assessment & Plan Note (Signed)
Maintaining NSR. He is tolerating anticoagulation. We will see if we can arrange samples for him today. Continue Diltiazem 120 mg once daily, Eliquis 5 mg twice daily (age, creatinine, wt). Recent labs from primary care reviewed.  In May 2024, hemoglobin was normal at 14, creatinine stable at 1.33.

## 2023-03-30 NOTE — Patient Instructions (Signed)
Medication Instructions:  Your physician recommends that you continue on your current medications as directed. Please refer to the Current Medication list given to you today.  *If you need a refill on your cardiac medications before your next appointment, please call your pharmacy*   Lab Work: None ordered  If you have labs (blood work) drawn today and your tests are completely normal, you will receive your results only by: MyChart Message (if you have MyChart) OR A paper copy in the mail If you have any lab test that is abnormal or we need to change your treatment, we will call you to review the results.   Testing/Procedures: Your physician has requested that you have an echocardiogram [place].  Echocardiography is a painless test that uses sound waves to create images of your heart. It provides your doctor with information about the size and shape of your heart and how well your heart's chambers and valves are working. This procedure takes approximately one hour. There are no restrictions for this procedure. Please do NOT wear cologne, perfume, aftershave, or lotions (deodorant is allowed). Please arrive 15 minutes prior to your appointment time.    Follow-Up: At [facility], you and your health needs are our priority.  As part of our continuing mission to provide you with exceptional heart care, we have created designated Provider Care Teams.  These Care Teams include your primary Cardiologist (physician) and Advanced Practice Providers (APPs -  Physician Assistants and Nurse Practitioners) who all work together to provide you with the care you need, when you need it.  We recommend signing up for the patient portal called "MyChart".  Sign up information is provided on this After Visit Summary.  MyChart is used to connect with patients for Virtual Visits (Telemedicine).  Patients are able to view lab/test results, encounter notes, upcoming appointments, etc.  Non-urgent  messages can be sent to your provider as well.   To learn more about what you can do with MyChart, go to [email or web address].    Your next appointment:   1 year(s)  Provider:   Kristeen Miss, MD  or Tereso Newcomer, PA-C         Other Instructions

## 2023-03-30 NOTE — Assessment & Plan Note (Signed)
Blood pressure well-controlled.  Continue diltiazem 120 mg daily.

## 2023-03-30 NOTE — Assessment & Plan Note (Signed)
42 mm by echocardiogram in 2021.  Arrange follow-up echocardiogram.

## 2023-03-30 NOTE — Progress Notes (Addendum)
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 male who returns for follow-up of CAD, A-fib, aortic dilation.  He was last seen by Dr. Elease Hashimoto 02/10/2022. He is here alone. He has trouble affording Eliquis and would like 4 weeks of samples today. He has not had chest pain, shortness of breath, syncope, near syncope.   Review of Systems  Cardiovascular:  Negative for claudication.  Gastrointestinal:  Negative for hematochezia and melena.  Genitourinary:  Negative for hematuria.   See HPI    Studies Reviewed:    EKG:  sinus brady, HR 58, no STTW changes, QTc 424 ms, no change from prior tracing  Risk Assessment/Calculations:    CHA2DS2-VASc Score = 4   This indicates a 4.8% annual risk of stroke. The patient's score is based upon: CHF History: 0 HTN History: 1 Diabetes History: 0 Stroke History: 0 Vascular Disease History: 1 Age Score: 2 Gender Score: 0          Physical Exam:   VS:  BP 120/68   Pulse (!) 58   Ht 5\' 7"  (1.702 m)   Wt 175 lb 12.8 oz (79.7 kg)   SpO2 96%   BMI 27.53 kg/m    Wt Readings from Last 3 Encounters:  03/30/23 175 lb 12.8 oz (79.7 kg)  07/10/22 176 lb 12.8 oz (80.2 kg)  02/10/22 175 lb 9.6 oz (79.7 kg)    Constitutional:      Appearance: Healthy appearance. Not in distress.  Neck:     Vascular:  Carotid bruit present. JVD normal.  Pulmonary:     Breath sounds: Normal breath sounds. No wheezing. No rales.  Cardiovascular:     Normal rate. Regular rhythm.     Murmurs: There is a grade 2/6 systolic murmur at the URSB.  Edema:    Peripheral edema absent.  Abdominal:     Palpations: Abdomen is soft.      ASSESSMENT AND PLAN:   Paroxysmal atrial fibrillation (HCC) Maintaining NSR. He is tolerating anticoagulation. We will see if we can arrange samples for him today. Continue Diltiazem 120 mg once daily, Eliquis 5 mg twice daily (age, creatinine, wt). Recent labs from primary care reviewed.  In May 2024, hemoglobin was normal at 14, creatinine stable at 1.33.  Essential hypertension Blood pressure well-controlled.  Continue diltiazem 120 mg daily.  Hyperlipidemia Labs from primary care reviewed.  LDL in May 2024 was 64.  This is optimal.  Continue Lipitor 40 mg daily.  Coronary artery calcification seen on CT scan He is not having chest discomfort to suggest angina.  He is not on antiplatelet therapy as he is on Eliquis.  Continue Lipitor 40 mg daily.  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  Arrange follow-up echocardiogram.     Dispo:  Return in about 1 year (  around 03/29/2024) for Routine Follow Up, w/ Dr. Elease Hashimoto, or Tereso Newcomer, PA-C.  Signed, Tereso Newcomer, PA-C

## 2023-03-30 NOTE — Assessment & Plan Note (Signed)
He is not having chest discomfort to suggest angina.  He is not on antiplatelet therapy as he is on Eliquis.  Continue Lipitor 40 mg daily.

## 2023-03-30 NOTE — Assessment & Plan Note (Signed)
Labs from primary care reviewed.  LDL in May 2024 was 64.  This is optimal.  Continue Lipitor 40 mg daily.

## 2023-03-31 ENCOUNTER — Encounter: Payer: Self-pay | Admitting: Cardiovascular Disease

## 2023-04-28 ENCOUNTER — Encounter: Payer: Self-pay | Admitting: Cardiovascular Disease

## 2023-04-28 ENCOUNTER — Other Ambulatory Visit (HOSPITAL_COMMUNITY): Payer: Self-pay | Admitting: Family Medicine

## 2023-04-28 ENCOUNTER — Other Ambulatory Visit: Payer: Self-pay | Admitting: Cardiovascular Disease

## 2023-04-28 DIAGNOSIS — G459 Transient cerebral ischemic attack, unspecified: Secondary | ICD-10-CM

## 2023-04-29 ENCOUNTER — Ambulatory Visit (HOSPITAL_COMMUNITY)
Admission: RE | Admit: 2023-04-29 | Discharge: 2023-04-29 | Disposition: A | Discharge status: Home / Self Care | Payer: PPO | Source: Ambulatory Visit | Attending: Family Medicine | Admitting: Family Medicine

## 2023-04-29 ENCOUNTER — Other Ambulatory Visit (HOSPITAL_COMMUNITY): Payer: Self-pay | Admitting: Family Medicine

## 2023-04-29 DIAGNOSIS — G459 Transient cerebral ischemic attack, unspecified: Secondary | ICD-10-CM | POA: Diagnosis present

## 2023-04-29 MED ORDER — DILTIAZEM HCL ER COATED BEADS 120 MG PO CP24: 120.0000 mg | ORAL_CAPSULE | Freq: Every day | ORAL | 3 refills | Status: DC

## 2023-05-05 ENCOUNTER — Observation Stay (HOSPITAL_BASED_OUTPATIENT_CLINIC_OR_DEPARTMENT_OTHER)
Admission: EM | Admit: 2023-05-05 | Discharge: 2023-05-06 | Disposition: A | Discharge status: Home / Self Care | Payer: PPO | Attending: Internal Medicine | Admitting: Internal Medicine

## 2023-05-05 ENCOUNTER — Emergency Department (HOSPITAL_BASED_OUTPATIENT_CLINIC_OR_DEPARTMENT_OTHER): Payer: PPO

## 2023-05-05 ENCOUNTER — Other Ambulatory Visit: Payer: Self-pay

## 2023-05-05 ENCOUNTER — Encounter: Payer: Self-pay | Admitting: Oncology

## 2023-05-05 ENCOUNTER — Other Ambulatory Visit (HOSPITAL_BASED_OUTPATIENT_CLINIC_OR_DEPARTMENT_OTHER): Payer: Self-pay

## 2023-05-05 ENCOUNTER — Encounter (HOSPITAL_BASED_OUTPATIENT_CLINIC_OR_DEPARTMENT_OTHER): Payer: Self-pay | Admitting: Emergency Medicine

## 2023-05-05 DIAGNOSIS — N183 Chronic kidney disease, stage 3 unspecified: Secondary | ICD-10-CM | POA: Diagnosis not present

## 2023-05-05 DIAGNOSIS — I63411 Cerebral infarction due to embolism of right middle cerebral artery: Secondary | ICD-10-CM | POA: Diagnosis not present

## 2023-05-05 DIAGNOSIS — Z7982 Long term (current) use of aspirin: Secondary | ICD-10-CM | POA: Diagnosis not present

## 2023-05-05 DIAGNOSIS — K118 Other diseases of salivary glands: Secondary | ICD-10-CM | POA: Insufficient documentation

## 2023-05-05 DIAGNOSIS — Z7901 Long term (current) use of anticoagulants: Secondary | ICD-10-CM | POA: Diagnosis not present

## 2023-05-05 DIAGNOSIS — I2510 Atherosclerotic heart disease of native coronary artery without angina pectoris: Secondary | ICD-10-CM | POA: Diagnosis not present

## 2023-05-05 DIAGNOSIS — I129 Hypertensive chronic kidney disease with stage 1 through stage 4 chronic kidney disease, or unspecified chronic kidney disease: Secondary | ICD-10-CM | POA: Insufficient documentation

## 2023-05-05 DIAGNOSIS — Z96643 Presence of artificial hip joint, bilateral: Secondary | ICD-10-CM | POA: Diagnosis not present

## 2023-05-05 DIAGNOSIS — G459 Transient cerebral ischemic attack, unspecified: Secondary | ICD-10-CM | POA: Diagnosis not present

## 2023-05-05 DIAGNOSIS — C07 Malignant neoplasm of parotid gland: Secondary | ICD-10-CM

## 2023-05-05 DIAGNOSIS — I6521 Occlusion and stenosis of right carotid artery: Secondary | ICD-10-CM | POA: Insufficient documentation

## 2023-05-05 DIAGNOSIS — Z853 Personal history of malignant neoplasm of breast: Secondary | ICD-10-CM | POA: Insufficient documentation

## 2023-05-05 DIAGNOSIS — Z96652 Presence of left artificial knee joint: Secondary | ICD-10-CM | POA: Diagnosis not present

## 2023-05-05 DIAGNOSIS — R29818 Other symptoms and signs involving the nervous system: Secondary | ICD-10-CM | POA: Diagnosis present

## 2023-05-05 DIAGNOSIS — I480 Paroxysmal atrial fibrillation: Secondary | ICD-10-CM | POA: Insufficient documentation

## 2023-05-05 DIAGNOSIS — Z79899 Other long term (current) drug therapy: Secondary | ICD-10-CM | POA: Diagnosis not present

## 2023-05-05 DIAGNOSIS — I671 Cerebral aneurysm, nonruptured: Secondary | ICD-10-CM

## 2023-05-05 LAB — DIFFERENTIAL
Abs Immature Granulocytes: 0.02 10*3/uL (ref 0.00–0.07)
Basophils Absolute: 0 10*3/uL (ref 0.0–0.1)
Basophils Relative: 1 %
Eosinophils Absolute: 0.1 10*3/uL (ref 0.0–0.5)
Eosinophils Relative: 2 %
Immature Granulocytes: 0 %
Lymphocytes Relative: 25 %
Lymphs Abs: 2 10*3/uL (ref 0.7–4.0)
Monocytes Absolute: 0.8 10*3/uL (ref 0.1–1.0)
Monocytes Relative: 10 %
Neutro Abs: 4.9 10*3/uL (ref 1.7–7.7)
Neutrophils Relative %: 62 %

## 2023-05-05 LAB — COMPREHENSIVE METABOLIC PANEL
ALT: 28 U/L (ref 0–44)
AST: 25 U/L (ref 15–41)
Albumin: 3.8 g/dL (ref 3.5–5.0)
Alkaline Phosphatase: 77 U/L (ref 38–126)
Anion gap: 9 (ref 5–15)
BUN: 25 mg/dL — ABNORMAL HIGH (ref 8–23)
CO2: 23 mmol/L (ref 22–32)
Calcium: 8.8 mg/dL — ABNORMAL LOW (ref 8.9–10.3)
Chloride: 106 mmol/L (ref 98–111)
Creatinine, Ser: 1.23 mg/dL (ref 0.61–1.24)
GFR, Estimated: >60 mL/min (ref >60)
Glucose, Bld: 109 mg/dL — ABNORMAL HIGH (ref 70–99)
Potassium: 4.1 mmol/L (ref 3.5–5.1)
Sodium: 138 mmol/L (ref 135–145)
Total Bilirubin: 0.4 mg/dL (ref 0.3–1.2)
Total Protein: 6.6 g/dL (ref 6.5–8.1)

## 2023-05-05 LAB — CBC
HCT: 42.6 % (ref 39.0–52.0)
Hemoglobin: 14.2 g/dL (ref 13.0–17.0)
MCH: 28.7 pg (ref 26.0–34.0)
MCHC: 33.3 g/dL (ref 30.0–36.0)
MCV: 86.2 fL (ref 80.0–100.0)
Platelets: 222 10*3/uL (ref 150–400)
RBC: 4.94 MIL/uL (ref 4.22–5.81)
RDW: 13.8 % (ref 11.5–15.5)
WBC: 7.8 10*3/uL (ref 4.0–10.5)
nRBC: 0 % (ref 0.0–0.2)

## 2023-05-05 LAB — URINALYSIS, ROUTINE W REFLEX MICROSCOPIC
Bilirubin Urine: NEGATIVE
Glucose, UA: NEGATIVE mg/dL
Hgb urine dipstick: NEGATIVE
Ketones, ur: NEGATIVE mg/dL
Leukocytes,Ua: NEGATIVE
Nitrite: NEGATIVE
Protein, ur: NEGATIVE mg/dL
Specific Gravity, Urine: <1.005 — ABNORMAL LOW (ref 1.005–1.030)
pH: 6.5 (ref 5.0–8.0)

## 2023-05-05 LAB — RAPID URINE DRUG SCREEN, HOSP PERFORMED
Amphetamines: NOT DETECTED
Barbiturates: NOT DETECTED
Benzodiazepines: NOT DETECTED
Cocaine: NOT DETECTED
Opiates: NOT DETECTED
Tetrahydrocannabinol: NOT DETECTED

## 2023-05-05 LAB — CBG MONITORING, ED: Glucose-Capillary: 101 mg/dL — ABNORMAL HIGH (ref 70–99)

## 2023-05-05 LAB — PROTIME-INR
INR: 1.2 (ref 0.8–1.2)
Prothrombin Time: 15.1 seconds (ref 11.4–15.2)

## 2023-05-05 LAB — APTT: aPTT: 30 seconds (ref 24–36)

## 2023-05-05 LAB — ETHANOL: Alcohol, Ethyl (B): <10 mg/dL (ref <10)

## 2023-05-05 MED ORDER — ALLOPURINOL 100 MG PO TABS
200.0000 mg | ORAL_TABLET | Freq: Every day | ORAL | Status: DC
  Administered 2023-05-06: 200 mg via ORAL
  Filled 2023-05-05: qty 2

## 2023-05-05 MED ORDER — ADULT MULTIVITAMIN W/MINERALS CH
1.0000 | ORAL_TABLET | Freq: Every day | ORAL | Status: DC
  Administered 2023-05-06: 1 via ORAL
  Filled 2023-05-05: qty 1

## 2023-05-05 MED ORDER — DILTIAZEM HCL ER COATED BEADS 120 MG PO CP24
120.0000 mg | ORAL_CAPSULE | Freq: Every day | ORAL | Status: DC
  Administered 2023-05-06: 120 mg via ORAL
  Filled 2023-05-05: qty 1

## 2023-05-05 MED ORDER — ACETAMINOPHEN 325 MG PO TABS: 650.0000 mg | ORAL_TABLET | ORAL | Status: DC | PRN

## 2023-05-05 MED ORDER — ENOXAPARIN SODIUM 40 MG/0.4ML IJ SOSY: 40.0000 mg | PREFILLED_SYRINGE | INTRAMUSCULAR | Status: DC

## 2023-05-05 MED ORDER — ATORVASTATIN CALCIUM 40 MG PO TABS
40.0000 mg | ORAL_TABLET | Freq: Every day | ORAL | Status: DC
  Administered 2023-05-05: 40 mg via ORAL
  Filled 2023-05-05 (×2): qty 1

## 2023-05-05 MED ORDER — ACETAMINOPHEN 650 MG RE SUPP: 650.0000 mg | RECTAL | Status: DC | PRN

## 2023-05-05 MED ORDER — APIXABAN 5 MG PO TABS
5.0000 mg | ORAL_TABLET | Freq: Two times a day (BID) | ORAL | Status: DC
  Administered 2023-05-05 – 2023-05-06 (×2): 5 mg via ORAL
  Filled 2023-05-05 (×2): qty 1

## 2023-05-05 MED ORDER — STROKE: EARLY STAGES OF RECOVERY BOOK
Freq: Once | Status: AC
  Filled 2023-05-05: qty 1

## 2023-05-05 MED ORDER — ACETAMINOPHEN 160 MG/5ML PO SOLN: 650.0000 mg | ORAL | Status: DC | PRN

## 2023-05-05 MED ORDER — ONDANSETRON HCL 4 MG/2ML IJ SOLN: 4.0000 mg | Freq: Three times a day (TID) | INTRAMUSCULAR | Status: DC | PRN

## 2023-05-05 MED ORDER — IOHEXOL 350 MG/ML SOLN
100.0000 mL | Freq: Once | INTRAVENOUS | Status: AC | PRN
  Administered 2023-05-05: 75 mL via INTRAVENOUS

## 2023-05-05 MED ORDER — SENNOSIDES-DOCUSATE SODIUM 8.6-50 MG PO TABS: 1.0000 | ORAL_TABLET | Freq: Every evening | ORAL | Status: DC | PRN

## 2023-05-05 MED ORDER — SODIUM CHLORIDE 0.9% FLUSH
3.0000 mL | Freq: Once | INTRAVENOUS | Status: AC
  Administered 2023-05-05: 3 mL via INTRAVENOUS

## 2023-05-05 MED ORDER — ASPIRIN 81 MG PO TBEC
81.0000 mg | DELAYED_RELEASE_TABLET | Freq: Every day | ORAL | Status: DC
  Administered 2023-05-05: 81 mg via ORAL
  Filled 2023-05-05 (×2): qty 1

## 2023-05-05 NOTE — H&P (Signed)
History and Physical    Patient: Logan Wright ZOX:[id number] DOB: 06-10-1946 DOA: 05/05/2023 DOS: the patient was seen and examined on 05/05/2023 PCP: Jackelyn Poling, DO  Patient coming from: Home  Chief Complaint:  Chief Complaint  Patient presents with   Aphasia   Facial Droop   HPI: Logan Wright is a 77 y.o. male with medical history significant of A-fib, hypertension, HLD, ascending aorta, right breast cancer, CAD, presents to [facility] due to strokelike symptoms.  Patient reported that they were at the gym around 10 this morning, noted slurred speech with a left facial droop which lasted for about 15 secs, and resolved spontaneously.  Of note, patient reported similar neurologic deficits on July 6, while patient was on a cruise which also lasted for few seconds and resolved completely.  Patient followed up with his PCP who ordered an MRI brain that showed small acute to early subacute infarct in the right parietal sub-cortical white matter.  Since patient is on Eliquis, PCP added on ASA.  Due to symptoms today, patient presented to the ER for further management.  Patient denies any other new complaints, denies any chest pain, shortness of breath, abdominal pain, nausea/vomiting, fever/chills.  In the ED, patient was completely asymptomatic, vital signs fairly stable, labs fairly unremarkable.  Neurology was consulted.  Hospitalist consulted for admission.      Review of Systems: As mentioned in the history of present illness. All other systems reviewed and are negative.     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   Echocardiogram 07/2020: 42 mm   Breast cancer (HCC) 2021   right breast   Breast cancer, right (HCC)    CKD (chronic kidney disease), stage III (HCC)    Colon adenoma 2015   Coronary artery calcification seen on CT scan    Diverticulosis    DENIES    DJD (degenerative joint disease)     ED (erectile dysfunction)    Elevated blood pressure 2007-2008   Esophageal reflux    GERD (gastroesophageal reflux disease)    Gout    Gynecomastia    LEFT   Hearing loss    HTN (hypertension)    Hypercholesteremia    Non-cardiac chest pain    OA (osteoarthritis)    LEFT SHOULDER   Onychomycosis of foot with other complication    PAF (paroxysmal atrial fibrillation) (HCC)    Pre-diabetes    Seborrhea    Severe frontal headaches    RESOLVED    Vertigo    RESOLVED    Wears glasses    Wears hearing aid    Past Surgical History:  Procedure Laterality Date   COLONSCOPY      HIP ARTHROPLASTY Bilateral    AT [place]    IR CV LINE INJECTION  05/14/2020   IR IMAGING GUIDED PORT INSERTION  05/17/2020   IR REMOVAL TUN ACCESS W/ PORT W/O FL MOD SED  05/17/2020   KNEE ARTHROSCOPY     UNSURE WHICH KNEE    MASTECTOMY Right 03/16/2020   MASTECTOMY W/ SENTINEL NODE BIOPSY Right 03/16/2020   Procedure: RIGHT BREAST MASTECTOMY WITH SENTINEL LYMPH NODE BIOPSY;  Surgeon: Abigail Miyamoto, MD;  Location: [facility];  Service: General;  Laterality: Right;   PORTACATH PLACEMENT Left 05/08/2020   Procedure: INSERTION PORT-A-CATH WITH ULTRASOUND;  Surgeon: Abigail Miyamoto, MD;  Location: [facility];  Service: General;  Laterality: Left;  LMA   TOTAL KNEE ARTHROPLASTY Left 11/23/2018   Procedure: TOTAL KNEE ARTHROPLASTY;  Surgeon: Durene Romans, MD;  Location: [facility];  Service: Orthopedics;  Laterality: Left;  70 mins   Social History:  reports that he has never smoked. He has never used smokeless tobacco. He reports that he does not drink alcohol and does not use drugs.  No Known Allergies  Family History  Problem Relation Age of Onset   Heart attack Mother    Stroke Mother    Heart failure Father        42   Hypertension Neg Hx     Prior to Admission medications   Medication Sig Start Date End Date Taking? Authorizing Provider  allopurinol (ZYLOPRIM) 100 MG tablet  Take 200 mg by mouth daily.   Yes [provider]  apixaban (ELIQUIS) 5 MG TABS tablet Take 1 tablet by mouth twice daily 11/07/22  Yes [name], Deloris Ping, MD  aspirin EC 81 MG tablet Take 81 mg by mouth daily. Swallow whole.   Yes [provider]  atorvastatin (LIPITOR) 40 MG tablet Take 40 mg by mouth at bedtime.    Yes [provider]  diltiazem (CARDIZEM CD) 120 MG 24 hr capsule Take 1 capsule (120 mg total) by mouth daily. 04/29/23  Yes [name], Deloris Ping, MD  GLUCOSAMINE-CHONDROITIN PO Take 2 tablets by mouth daily.   Yes [provider]  Multiple Vitamin (MULTIVITAMIN) tablet Take 1 tablet by mouth daily.    Yes [provider]  Omega-3 Fatty Acids (FISH OIL ULTRA) 1400 MG CAPS Take 1,400-2,800 mg by mouth See admin instructions. Take 1400 mg in the morning and 2800 mg in the evening   Yes [provider]  Potassium 99 MG TABS Take 1 tablet by mouth daily.   Yes [provider]  tamoxifen (NOLVADEX) 20 MG tablet Take 1 tablet (20 mg total) by mouth daily. 11/11/22  Yes Serena Croissant, MD    Physical Exam: Vitals:   05/05/23 1450 05/05/23 1500 05/05/23 1504 05/05/23 1550  BP: 135/70 125/70  (!) 145/77  Pulse: 73 77  73  Resp: (!) 25 19  18   Temp:   98.2 F (36.8 C) (!) 97.4 F (36.3 C)  TempSrc:   Oral Oral  SpO2: 97% 94%  98%  Weight:      Height:       General: NAD  Cardiovascular: S1, S2 present Respiratory: CTAB Abdomen: Soft, nontender, nondistended, bowel sounds present Musculoskeletal: No bilateral pedal edema noted Skin: Normal Psychiatry: Normal mood  Neurology: No obvious focal neurologic deficits noted, strength equal in all extremities, sensation intact  Data Reviewed: As noted above  Assessment and Plan:  Acute ischemic infarct MRI brain done on 7/17 showed small acute to early subacute infarct in the right parietal sub-cortical white matter CTA head/neck showed no acute intracranial abnormality,  approximately 50% stenosis of the proximal right cervical ICA, no acute hemorrhage noted Echo pending LDL, A1c pending Continue Eliquis, aspirin Neurology consulted, appreciate recs PT/OT/SLP Neurochecks, fall precautions Telemetry  Paroxysmal A-fib EKG showed sinus rhythm with PACs Continue diltiazem, Eliquis  Hypertension BP stable Acute ischemia noted on 7/17 Continue diltiazem  Prediabetes Last A1c 6.3 on 11/2018, repeat pending  HLD Lipid panel pending Continue statin  Right breast cancer On tamoxifen Discussed with oncologist Dr. 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 Observation status is judged to be reasonable and necessary in order to provide the required intensity of service to ensure the patient's safety. The patient's presenting symptoms, physical exam findings, and initial radiographic and laboratory data in the context of their medical condition is felt to place them at decreased risk for further clinical deterioration. Furthermore, it is anticipated that the patient will be medically stable for discharge from the hospital within 2 midnights of admission.   Author: Briant Cedar, MD 05/05/2023 4:45 PM  For on call review [email or web address].

## 2023-05-05 NOTE — ED Notes (Signed)
Dr Otelia LimesLindzen paged

## 2023-05-05 NOTE — ED Notes (Signed)
Kim with cl called for consult

## 2023-05-05 NOTE — Consult Note (Signed)
Neurology Consultation Reason for Consult: TIA Requesting Physician: Andreas Newport  CC: Left face droop and tingling w/ slurred speech   History is obtained from: Patient and chart review   HPI: Logan Wright is a 77 y.o. 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  This lasted less than 20 seconds and occurred with sudden onset while he was at the gym.  He had a similar episode on July 6 while traveling which lasted about 20 seconds.  No other focal neurological symptoms   He endorses adherence to his Eliquis; notes he had a single episode of atrial fibrillation after completing chemotherapy but he has continued on Eliquis without any missed doses.  He had seen his primary care physician after his July 6 episode and was started on a baby aspirin at that time.  He additionally takes atorvastatin 40 mg which he is tolerating well.  He does have some chronic right shoulder arthritis which slightly limits movement in that arm as well as chronic left eye ptosis but otherwise no focal neurological deficits.  No prior stroke/TIA symptoms that he is aware of  Per chart review primary team is already discussed tamoxifen with his oncologist and there is a consideration of stopping this medication due to its association with increased risk of stroke  LKW: 10:25 AM (though symptoms are fully resolved) Thrombolytic given?: No, symptoms resolved, subacute stroke on MRI 7/17 IA performed?: No, exam not consistent with LVO Premorbid modified rankin scale:      0 - No symptoms.  ROS: All  other review of systems was negative except as noted in the HPI.   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   Echocardiogram 07/2020: 42 mm   Breast cancer (HCC) 2021   right breast   Breast cancer, right (HCC)    CKD (chronic kidney disease), stage III (HCC)    Colon adenoma 2015   Coronary artery calcification seen on CT scan    Diverticulosis    DENIES    DJD (degenerative joint disease)    ED (erectile dysfunction)    Elevated blood pressure 2007-2008   Esophageal reflux    GERD (gastroesophageal reflux disease)    Gout    Gynecomastia    LEFT   Hearing loss    HTN (hypertension)    Hypercholesteremia    Non-cardiac chest pain    OA (osteoarthritis)    LEFT SHOULDER   Onychomycosis of foot with other complication    PAF (paroxysmal atrial fibrillation) (HCC)    Pre-diabetes    Seborrhea    Severe frontal headaches    RESOLVED    Vertigo    RESOLVED    Wears glasses    Wears hearing aid    Past Surgical History:  Procedure Laterality Date   COLONSCOPY      HIP ARTHROPLASTY Bilateral    AT [place]    IR CV LINE INJECTION  05/14/2020   IR IMAGING GUIDED PORT INSERTION  05/17/2020   IR REMOVAL TUN ACCESS W/ PORT W/O FL MOD SED  05/17/2020   KNEE ARTHROSCOPY     UNSURE WHICH KNEE    MASTECTOMY Right 03/16/2020   MASTECTOMY W/ SENTINEL NODE BIOPSY Right 03/16/2020   Procedure: RIGHT BREAST MASTECTOMY WITH SENTINEL LYMPH NODE BIOPSY;  Surgeon: Abigail Miyamoto, MD;  Location: [facility];  Service: General;  Laterality: Right;   PORTACATH PLACEMENT Left 05/08/2020   Procedure: INSERTION PORT-A-CATH WITH ULTRASOUND;  Surgeon: Abigail Miyamoto, MD;  Location: [facility];  Service: General;  Laterality: Left;  LMA   TOTAL KNEE ARTHROPLASTY Left 11/23/2018   Procedure: TOTAL KNEE ARTHROPLASTY;  Surgeon: Durene Romans, MD;  Location: [facility];  Service: Orthopedics;  Laterality:  Left;  70 mins   Current Outpatient Medications  Medication Instructions   allopurinol (ZYLOPRIM) 200 mg, Oral, Daily   apixaban (ELIQUIS) 5 MG TABS tablet Take 1 tablet by mouth twice daily   aspirin EC 81 mg, Oral, Daily, Swallow whole.   atorvastatin (LIPITOR) 40 mg, Oral, Nightly   diltiazem (CARDIZEM CD) 120 mg, Oral, Daily   Fish Oil Ultra 1,400-2,800 mg, Oral, See admin instructions, Take 1400 mg in the morning and 2800 mg in the evening    GLUCOSAMINE-CHONDROITIN PO 2 tablets, Oral, Daily   Multiple Vitamin (MULTIVITAMIN) tablet 1 tablet, Oral, Daily   Potassium 99 MG TABS 1 tablet, Oral, Daily   tamoxifen (NOLVADEX) 20 mg, Oral, Daily     Family History  Problem Relation Age of Onset   Heart attack Mother    Stroke Mother    Heart failure Father        58   Hypertension Neg Hx     Social History:  reports that he has never smoked. He has never used smokeless tobacco. He reports that he does not drink alcohol and does not use drugs.   Exam: Current vital signs: BP (!) 145/77 (BP Location: Left Arm)   Pulse 73   Temp (!) 97.4 F (36.3 C) (Oral)   Resp 18   Ht 5\' 7"  (1.702 m)   Wt 77.1 kg   SpO2 98%   BMI 26.63 kg/m  Vital signs in last 24 hours: Temp:  [97.4 F (36.3 C)-98.2 F (36.8 C)] 97.4 F (36.3 C) (07/23 1550) Pulse Rate:  [58-79] 73 (07/23 1550) Resp:  [16-28] 18 (07/23 1550) BP: (125-158)/(66-92) 145/77 (07/23 1550) SpO2:  [93 %-99 %] 98 % (07/23 1550) Weight:  [77.1 kg] 77.1 kg (07/23 1039)   Physical Exam  Constitutional: Appears well-developed and well-nourished.  Psych: Affect appropriate to situation, pleasant and cooperative Eyes: No scleral injection HENT: No oropharyngeal obstruction.  MSK: no joint deformities.  Cardiovascular: Perfusing extremities well Respiratory: Effort normal, non-labored breathing GI: Soft.  No distension. There is no tenderness.  Skin: Warm dry and intact visible skin  Neuro: Mental Status: Patient is  awake, alert, oriented to person, place, month, year, and situation. Patient is able to give a clear and coherent history. No signs of aphasia or neglect Able to easily teach back to me our discussion regarding workup to date and plan of care Cranial Nerves: II: Visual Fields are full. Pupils are equal, round, and reactive to light.   III,IV, VI: EOMI, with notable left eye ptosis V: Facial sensation is symmetric to temperature and light touch VII: Facial movement is symmetric.  VIII: hearing is intact to voice and symmetric on tuning fork evaluation X: Uvula elevates symmetrically XI: Shoulder shrug is symmetric. XII: tongue is midline without atrophy or fasciculations.  Motor: Tone is normal.  Bulk is normal. 5/5 strength was present in all four extremities, except for mildly limited right deltoid due to pain (4/5).  He is also unable to fully pronate the right hand due to this shoulder pain and does have some slight pronator drift in that right upper extremity Sensory: Sensation is symmetric to light touch and temperature in the arms and legs.  There is no length dependent loss of temperature sensation Deep Tendon Reflexes: 2+ and symmetric in the brachioradialis and patellae.  Cerebellar: FNF and HKS are intact bilaterally Gait:  Deferred   NIHSS total 1 Score breakdown: 1 point for mild right arm drift (chronic and subtle noted only with distraction)  I have reviewed labs in epic and the results pertinent to this consultation are:  Basic Metabolic Panel: Recent Labs  Lab 05/05/23 1045  NA 138  K 4.1  CL 106  CO2 23  GLUCOSE 109*  BUN 25*  CREATININE 1.23  CALCIUM 8.8*    CBC: Recent Labs  Lab 05/05/23 1045  WBC 7.8  NEUTROABS 4.9  HGB 14.2  HCT 42.6  MCV 86.2  PLT 222    Coagulation Studies: Recent Labs    05/05/23 1045  LABPROT 15.1  INR 1.2      I have reviewed the images obtained:  MRI brain and CTA personally reviewed with patient at  bedside  MRI brain reveals posterior right parietal subacute infarct in the right MCA/PCA watershed territory CTA reveals a significant right carotid stenosis with bulky calcified plaque (and incidental L partoid mass noted on radiology review)   Impression: This is a 77 year old gentleman who is medically optimized from a stroke risk perspective given regular medical care for his chronic medical conditions and good adherence to medications.  Certainly the tamoxifen may be increasing his risk of stroke and there is a question of left parotid mass as well that needs further workup on an outpatient basis.  However given the symptoms are clearly referable to his right carotid stenosis and have recurred despite addition of aspirin by his PCP, with clearly demonstrated subacute small stroke on MRI, I do think urgent evaluation by vascular surgery is indicated.  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 - Stroke labs HgbA1c, fasting lipid panel  - Goal A1c less than 7%, LDL less than 70,  - Adjust home atorvastatin 40 mg if needed - Echocardiogram - Please reach out to vascular surgery in the morning for consultation - Okay to continue aspirin 81 mg for now but defer antiplatelet therapy to vascular team - Continue Eliquis 5 mg twice daily for atrial fibrillation - Appreciate oncology consideration of stopping tamoxifen which does increase stroke risk - Agree with plan for L partoid mass eval - Risk factor modification - Telemetry monitoring for up to 48 hours  - Blood pressure goal: Normotension (out of permissive hypertension window) - PT consult, OT consult, Speech consult, not needed as patient is at his baseline  Brooke Dare MD-PhD [facility] [phone] Available 7 PM to 7  AM, outside of these hours please call Neurologist on call as listed on Amion.

## 2023-05-05 NOTE — ED Provider Notes (Signed)
[place] [facility]GENCY DEPARTMENT AT [facility] Provider Note   CSN: [id number] Arrival date & time: 05/05/23  1033     History  Chief Complaint  Patient presents with   Aphasia   Facial Droop    SOTA HETZ is a 77 y.o. male history of breast cancer with chemo, A-fib on Eliquis, ascending aortic dilation, GERD, hypertension presented with strokelike symptoms.  Patient and wife state they were at the gym when at 10:23 AM today patient began slurring words and having facial droop according to patient and wife.  Patient states that on July 6 of this year patient was traveling when he had same symptoms that only lasted for a few seconds and ultimately resolved.  Patient states he had MRI in the past that shows send multiple small strokes but no big stroke.  Patient states symptoms lasted today for roughly 30 seconds but then resolved.  Patient dates he is asymptomatic right now.  Wife states that patient's been able to walk without issue and states his symptoms have resolved.  Patient denies chest pain, shortness of breath, vision changes, new onset weakness, abdominal pain, nausea/vomiting, fevers, dysuria  Home Medications Prior to Admission medications   Medication Sig Start Date End Date Taking? Authorizing Provider  allopurinol (ZYLOPRIM) 100 MG tablet Take 200 mg by mouth daily.   Yes [provider]  apixaban (ELIQUIS) 5 MG TABS tablet Take 1 tablet by mouth twice daily 11/07/22  Yes [name], Deloris Ping, MD  aspirin EC 81 MG tablet Take 81 mg by mouth daily. Swallow whole.   Yes [provider]  atorvastatin (LIPITOR) 40 MG tablet Take 40 mg by mouth at bedtime.    Yes [provider]  diltiazem (CARDIZEM CD) 120 MG 24 hr capsule Take 1 capsule (120 mg total) by mouth daily. 04/29/23  Yes [name], Deloris Ping, MD  GLUCOSAMINE-CHONDROITIN PO Take 2 tablets by mouth daily.   Yes [provider]  Multiple Vitamin (MULTIVITAMIN) tablet Take 1 tablet  by mouth daily.    Yes [provider]  Omega-3 Fatty Acids (FISH OIL ULTRA) 1400 MG CAPS Take 1,400-2,800 mg by mouth See admin instructions. Take 1400 mg in the morning and 2800 mg in the evening   Yes [provider]  Potassium 99 MG TABS Take 1 tablet by mouth daily.   Yes [provider]  tamoxifen (NOLVADEX) 20 MG tablet Take 1 tablet (20 mg total) by mouth daily. 11/11/22  Yes Serena Croissant, MD      Allergies    Patient has no known allergies.    Review of Systems   Review of Systems See HPI Physical Exam Updated Vital Signs BP 139/66   Pulse 61   Resp (!) 28   Ht 5\' 7"  (1.702 m)   Wt 77.1 kg   SpO2 96%   BMI 26.63 kg/m  Physical Exam Vitals reviewed.  Constitutional:      General: He is not in acute distress. HENT:     Head: Normocephalic and atraumatic.  Eyes:     Extraocular Movements: Extraocular movements intact.     Conjunctiva/sclera: Conjunctivae normal.     Pupils: Pupils are equal, round, and reactive to light.  Cardiovascular:     Rate and Rhythm: Normal rate and regular rhythm.     Pulses: Normal pulses.     Heart sounds: Normal heart sounds.     Comments: 2+ bilateral radial/dorsalis pedis pulses with regular rate Pulmonary:  Effort: Pulmonary effort is normal. No respiratory distress.     Breath sounds: Normal breath sounds.  Abdominal:     Palpations: Abdomen is soft.     Tenderness: There is no abdominal tenderness. There is no guarding or rebound.  Musculoskeletal:        General: Normal range of motion.     Cervical back: Normal range of motion and neck supple.     Comments: 5 out of 5 bilateral grip/leg extension strength  Skin:    General: Skin is warm and dry.     Capillary Refill: Capillary refill takes less than 2 seconds.  Neurological:     General: No focal deficit present.     Mental Status: He is alert and oriented to person, place, and time.     Sensory: Sensation is intact.     Motor: Motor function  is intact.     Coordination: Coordination is intact.     Gait: Gait is intact.     Comments: Sensation intact in all 4 limbs Vision grossly intact Cranial nerves III through XII intact  Psychiatric:        Mood and Affect: Mood normal.     ED Results / Procedures / Treatments   Labs (all labs ordered are listed, but only abnormal results are displayed) Labs Reviewed  COMPREHENSIVE METABOLIC PANEL - Abnormal; Notable for the following components:      Result Value   Glucose, Bld 109 (*)    BUN 25 (*)    Calcium 8.8 (*)    All other components within normal limits  URINALYSIS, ROUTINE W REFLEX MICROSCOPIC - Abnormal; Notable for the following components:   Color, Urine STRAW (*)    Specific Gravity, Urine <1.005 (*)    All other components within normal limits  CBG MONITORING, ED - Abnormal; Notable for the following components:   Glucose-Capillary 101 (*)    All other components within normal limits  PROTIME-INR  APTT  CBC  DIFFERENTIAL  ETHANOL  RAPID URINE DRUG SCREEN, HOSP PERFORMED    EKG EKG Interpretation Date/Time:  Tuesday May 05 2023 10:41:03 EDT Ventricular Rate:  76 PR Interval:  154 QRS Duration:  72 QT Interval:  396 QTC Calculation: 445 R Axis:   33  Text Interpretation: Sinus rhythm with Premature atrial complexes in a pattern of bigeminy Cannot rule out Anterior infarct , age undetermined Abnormal ECG When compared with ECG of 23-Jul-2020 05:21, Sinus rhythm has replaced Atrial fibrillation Questionable change in QRS duration Minimal criteria for Anterior infarct are now Present Confirmed by Vanetta Mulders [phone]) on 05/05/2023 12:17:04 PM  Radiology CT ANGIO HEAD NECK W WO CM  Result Date: 05/05/2023 CLINICAL DATA:  Transient ischemic attack. Left-sided facial droop and tingling with slurred speech. EXAM: CT ANGIOGRAPHY HEAD AND NECK WITH AND WITHOUT CONTRAST TECHNIQUE: Multidetector CT imaging of the head and neck was performed using the standard  protocol during bolus administration of intravenous contrast. Multiplanar CT image reconstructions and MIPs were obtained to evaluate the vascular anatomy. Carotid stenosis measurements (when applicable) are obtained utilizing NASCET criteria, using the distal internal carotid diameter as the denominator. RADIATION DOSE REDUCTION: This exam was performed according to the departmental dose-optimization program which includes automated exposure control, adjustment of the mA and/or kV according to patient size and/or use of iterative reconstruction technique. CONTRAST:  75mL OMNIPAQUE IOHEXOL 350 MG/ML SOLN COMPARISON:  MRI brain 04/29/2023. FINDINGS: CT HEAD FINDINGS Brain: No acute hemorrhage. Unchanged moderate chronic small-vessel disease. Cortical  gray-white differentiation is otherwise preserved. Prominence of the ventricles and sulci within expected range for age. No hydrocephalus or extra-axial collection. No mass effect or midline shift. Vascular: No hyperdense vessel or unexpected calcification. Skull: No calvarial fracture or suspicious bone lesion. Skull base is unremarkable. Sinuses/Orbits: Unremarkable. Other: None. Review of the MIP images confirms the above findings CTA NECK FINDINGS Aortic arch: Three-vessel arch configuration. Atherosclerotic calcifications of the aortic arch and arch vessel origins. Arch vessel origins are patent. 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 Left carotid system: No evidence of dissection, stenosis (50% or greater), or occlusion. Moderate calcified plaque along the left carotid bulb and proximal left cervical ICA. Vertebral arteries: Codominant. No evidence of dissection, stenosis (50% or greater), or occlusion. 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 Other neck: 1.6 cm hyperattenuating mass in the left parotid gland (axial image 86 series 8). Upper chest: Unremarkable. Review of the MIP images  confirms the above findings CTA HEAD FINDINGS Anterior circulation: Calcified plaque along the carotid siphons without hemodynamically significant stenosis. 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 The proximal ACAs and MCAs are patent without stenosis or aneurysm. Distal branches are symmetric. Posterior circulation: Mild irregular narrowing of the basilar artery, likely atherosclerotic in etiology. The SCAs, AICAs and PICAs are patent proximally. The PCAs are patent proximally without stenosis or aneurysm. Distal branches are symmetric. Venous sinuses: As permitted by contrast timing, patent. Anatomic variants: None. Review of the MIP images confirms the above findings IMPRESSION: 1. No acute intracranial abnormality. Unchanged moderate chronic small-vessel disease. 2. No large vessel occlusion. 3. 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 4. 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 5. 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 Recommend ENT referral. Aortic Atherosclerosis (ICD10-I70.0). Electronically Signed   By: Orvan Falconer M.D.   On: 05/05/2023 12:33    Procedures .Critical Care  Performed by: Netta Corrigan, PA-C Authorized by: Netta Corrigan, PA-C   Critical care provider statement:    Critical care time (minutes):  40   Critical care time was exclusive of:  Separately billable procedures and treating other patients   Critical care was necessary to treat or prevent imminent or life-threatening deterioration of the following conditions: tPA consideration.   Critical care was time spent personally by me on the following activities:  Development of treatment plan with patient or surrogate, discussions with consultants, evaluation of patient's response to treatment, examination of patient, obtaining history from patient or surrogate, review  of old charts, re-evaluation of patient's condition, pulse oximetry, ordering and review of radiographic studies, ordering and review of laboratory studies and ordering and performing treatments and interventions   I assumed direction of critical care for this patient from another provider in my specialty: no     Care discussed with: admitting provider       Medications Ordered in ED Medications  sodium chloride flush (NS) 0.9 % injection 3 mL (3 mLs Intravenous Given 05/05/23 1257)  iohexol (OMNIPAQUE) 350 MG/ML injection 100 mL (75 mLs Intravenous Contrast Given 05/05/23 1126)    ED Course/ Medical Decision Making/ A&P                             Medical Decision Making Amount and/or Complexity of Data Reviewed  Labs: ordered. Radiology: ordered.   Jacinta Shoe 77 y.o. presented today for strokelike symptoms. Working DDx that I considered at this time includes, but not limited to, CVA/TIA, ICH, intracranial mass, critical dehydration, heptatic dysfunction, uremia, hypercarbia, intoxication, endocrine abnormality, toxidrome.  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 ST segments without concerns for elevations  Discussion with Independent Historian:  Wife  Discussion of Management of Tests:  Otelia Limes, MD Neurologist ; Alinda Money, MD Hospitalist  Risk: High: hospitalization or escalation of hospital-level care  Risk Stratification Score: none  Staffed with Deretha Emory, MD  R/o DDx: CVA, ICH, intracranial mass, critical dehydration, heptatic dysfunction, uremia, hypercarbia, intoxication, endocrine abnormality, toxidrome:  These diagnoses are unlikely in regards to patient's presentation, history, physical exam, lab/imaging  Plan: On exam patient was in no acute distress stable vitals.  Patient was seen walking to the room without gait abnormalities.  On exam patient had reassuring neurologic exam along with rest of his physical exam.  Last known normal was 10:23 AM this morning.  Patient states that at this time he is asymptomatic and so there is a high likelihood patient has had a TIA versus stroke.  Patient states that he had similar symptoms earlier this month and had an MRI that showed smaller strokes indicative of multiple TIAs most likely.  Patient states he does not have a neurology appointment till September.  Stroke workup was initiated however code stroke was not initiated as patient was asymptomatic.  Teleneurology was consulted and CTA head and neck was ordered as patient's previous MR shows severe stenosis in his ICA along with aneurysms.  Patient stable at this time.  Patient's CTA does not show acute changes but does show significant vascular pathology likely contributing to his symptoms.  I spoke to neurology and they recommended inpatient stay to be evaluated for possible TIA.  Hospitalist to be consulted as patient is stable for admission at this time.  I spoke to the hospitalist and patient was accepted for admission.  Patient stable for admission at this time.        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

## 2023-05-05 NOTE — Plan of Care (Signed)

## 2023-05-05 NOTE — Progress Notes (Signed)
PT Cancellation Note  Patient Details Name: Logan Wright MRN: [id number] DOB: 05/06/1946   Cancelled Treatment:    Reason Eval/Treat Not Completed: PT screened, no needs identified, will sign off. Pt reports resolution of symptoms and back to baseline, denies concerns about mobility at this time and declines the need for PT evaluation. PT signing off.   Arlyss Gandy 05/05/2023, 4:42 PM

## 2023-05-05 NOTE — ED Triage Notes (Signed)
Pt arrives to ED with c/o sudden onset of left sided facial droop, left sided facial tingling, and slurred speech at approx 1025am lasting 10-15 seconds.

## 2023-05-05 NOTE — ED Notes (Signed)
Attempted to give report, bed changed and nurse to call back when bed ready.

## 2023-05-06 ENCOUNTER — Other Ambulatory Visit (HOSPITAL_BASED_OUTPATIENT_CLINIC_OR_DEPARTMENT_OTHER): Payer: Self-pay

## 2023-05-06 ENCOUNTER — Observation Stay (HOSPITAL_COMMUNITY): Payer: PPO

## 2023-05-06 DIAGNOSIS — G459 Transient cerebral ischemic attack, unspecified: Secondary | ICD-10-CM

## 2023-05-06 LAB — CBC
HCT: 38.8 % — ABNORMAL LOW (ref 39.0–52.0)
Hemoglobin: 13 g/dL (ref 13.0–17.0)
MCH: 28.8 pg (ref 26.0–34.0)
MCHC: 33.5 g/dL (ref 30.0–36.0)
MCV: 86 fL (ref 80.0–100.0)
Platelets: 207 10*3/uL (ref 150–400)
RBC: 4.51 MIL/uL (ref 4.22–5.81)
RDW: 13.9 % (ref 11.5–15.5)
WBC: 8.2 10*3/uL (ref 4.0–10.5)
nRBC: 0 % (ref 0.0–0.2)

## 2023-05-06 LAB — ECHOCARDIOGRAM COMPLETE
AR max vel: 1.65 cm2
AV Area VTI: 1.61 cm2
AV Area mean vel: 1.62 cm2
AV Mean grad: 8.3 mmHg
AV Peak grad: 15.3 mmHg
Ao pk vel: 1.95 m/s
Area-P 1/2: 2.91 cm2
Height: 67 in
MV M vel: 2.63 m/s
MV Peak grad: 27.7 mmHg
S' Lateral: 3.1 cm
Weight: 2720 oz

## 2023-05-06 LAB — BASIC METABOLIC PANEL
Anion gap: 6 (ref 5–15)
BUN: 19 mg/dL (ref 8–23)
CO2: 26 mmol/L (ref 22–32)
Calcium: 8.5 mg/dL — ABNORMAL LOW (ref 8.9–10.3)
Chloride: 107 mmol/L (ref 98–111)
Creatinine, Ser: 1.18 mg/dL (ref 0.61–1.24)
GFR, Estimated: >60 mL/min (ref >60)
Glucose, Bld: 96 mg/dL (ref 70–99)
Potassium: 3.9 mmol/L (ref 3.5–5.1)
Sodium: 139 mmol/L (ref 135–145)

## 2023-05-06 LAB — HEMOGLOBIN A1C
Hgb A1c MFr Bld: 5.5 % (ref 4.8–5.6)
Mean Plasma Glucose: 111.15 mg/dL

## 2023-05-06 LAB — LIPID PANEL
Cholesterol: 130 mg/dL (ref 0–200)
HDL: 36 mg/dL — ABNORMAL LOW (ref >40)
LDL Cholesterol: 76 mg/dL (ref 0–99)
Total CHOL/HDL Ratio: 3.6 RATIO
Triglycerides: 88 mg/dL (ref <150)
VLDL: 18 mg/dL (ref 0–40)

## 2023-05-06 MED ORDER — APIXABAN 5 MG PO TABS
5.0000 mg | ORAL_TABLET | Freq: Two times a day (BID) | ORAL | 1 refills | Status: DC
Start: 2023-05-06 — End: 2023-10-15
  Filled 2023-05-06: qty 60, 30d supply, fill #0

## 2023-05-06 MED ORDER — CLOPIDOGREL BISULFATE 75 MG PO TABS
75.0000 mg | ORAL_TABLET | Freq: Every day | ORAL | 0 refills | Status: DC
  Filled 2023-05-06: qty 30, 30d supply, fill #0

## 2023-05-06 MED ORDER — CLOPIDOGREL BISULFATE 75 MG PO TABS: 75.0000 mg | ORAL_TABLET | Freq: Every day | ORAL | Status: DC

## 2023-05-06 NOTE — Consult Note (Addendum)
Hospital Consult    Reason for Consult:  symptomatic right ICA stenosis Requesting Physician:  Natale Milch MRN #:  [id number]  History of Present Illness: This is a 77 y.o. male who presented to the hospital with slurred speech, left facial droop, which lasted about 15 seconds and resolved.  He had similar sx on a cruise in Early July and he states it resolved as quickly as he could tell his wife.  He followed up with his PCP and an MRI of the brain was ordered, which revealed a small acute to early subacute infarct in the right parietal sub-cortical white matter.   He did have a CTA of the head/neck on 05/05/2023, which revealed ~ 80% stenosis of the proximal right cervical ICA.  He did not have any other sx.  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   Pt has hx of afib and on Eliquis.  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  hx of CAD, HLD, HTN and breast cancer.   He has hx of elevated creatinine in the past but creatinine today is 1.18.  He states that after his mastectomy, he did have some afib and on Eliquis.  He checks his heart rate and he has not seen any further afib.  He has cramping in at night but does not endorse claudication.  He and his wife of 55 years are very active and work out regularly.    He states that his parents have hx of CAD and stroke but lived to be in late 14's and 100.   The pt is on a statin for cholesterol management.  The pt is on a daily aspirin.   Other AC:  Eliquis The pt is on CCB for hypertension.   The pt is not on medication for diabetes PTA. Tobacco hx:  never  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   Echocardiogram 07/2020: 42 mm   Breast cancer (HCC) 2021   right breast   Breast cancer, right (HCC)    CKD (chronic kidney disease), stage III (HCC)     Colon adenoma 2015   Coronary artery calcification seen on CT scan    Diverticulosis    DENIES    DJD (degenerative joint disease)    ED (erectile dysfunction)    Elevated blood pressure 2007-2008   Esophageal reflux    GERD (gastroesophageal reflux disease)    Gout    Gynecomastia    LEFT   Hearing loss    HTN (hypertension)    Hypercholesteremia    Non-cardiac chest pain    OA (osteoarthritis)    LEFT SHOULDER   Onychomycosis of foot with other complication    PAF (paroxysmal atrial fibrillation) (HCC)    Pre-diabetes    Seborrhea    Severe frontal headaches    RESOLVED    Vertigo    RESOLVED    Wears glasses    Wears hearing aid     Past Surgical History:  Procedure Laterality Date   COLONSCOPY      HIP ARTHROPLASTY Bilateral    AT     IR CV LINE INJECTION  05/14/2020   IR IMAGING GUIDED PORT INSERTION  05/17/2020   IR REMOVAL TUN ACCESS W/ PORT W/O FL MOD SED  05/17/2020   KNEE ARTHROSCOPY  UNSURE WHICH KNEE    MASTECTOMY Right 03/16/2020   MASTECTOMY W/ SENTINEL NODE BIOPSY Right 03/16/2020   Procedure: RIGHT BREAST MASTECTOMY WITH SENTINEL LYMPH NODE BIOPSY;  Surgeon: Abigail Miyamoto, MD;  Location:  SURGERY CENTER;  Service: General;  Laterality: Right;   PORTACATH PLACEMENT Left 05/08/2020   Procedure: INSERTION PORT-A-CATH WITH ULTRASOUND;  Surgeon: Abigail Miyamoto, MD;  Location: [facility] OR;  Service: General;  Laterality: Left;  LMA   TOTAL KNEE ARTHROPLASTY Left 11/23/2018   Procedure: TOTAL KNEE ARTHROPLASTY;  Surgeon: Durene Romans, MD;  Location: [facility] ORS;  Service: Orthopedics;  Laterality: Left;  70 mins    No Known Allergies  Prior to Admission medications   Medication Sig Start Date End Date Taking? Authorizing Provider  allopurinol (ZYLOPRIM) 100 MG tablet Take 200 mg by mouth daily.   Yes [provider]  apixaban (ELIQUIS) 5 MG TABS tablet Take 1 tablet by mouth twice daily 11/07/22  Yes [name], Deloris Ping, MD   aspirin EC 81 MG tablet Take 81 mg by mouth daily. Swallow whole.   Yes [provider]  atorvastatin (LIPITOR) 40 MG tablet Take 40 mg by mouth at bedtime.    Yes [provider]  diltiazem (CARDIZEM CD) 120 MG 24 hr capsule Take 1 capsule (120 mg total) by mouth daily. 04/29/23  Yes [name], Deloris Ping, MD  GLUCOSAMINE-CHONDROITIN PO Take 2 tablets by mouth daily.   Yes [provider]  Multiple Vitamin (MULTIVITAMIN) tablet Take 1 tablet by mouth daily.    Yes [provider]  Omega-3 Fatty Acids (FISH OIL ULTRA) 1400 MG CAPS Take 1,400-2,800 mg by mouth See admin instructions. Take 1400 mg in the morning and 2800 mg in the evening   Yes [provider]  Potassium 99 MG TABS Take 1 tablet by mouth daily.   Yes [provider]  tamoxifen (NOLVADEX) 20 MG tablet Take 1 tablet (20 mg total) by mouth daily. 11/11/22  Yes Serena Croissant, MD    Social History   Socioeconomic History   Marital status: Married    Spouse name: Not on file   Number of children: Not on file   Years of education: Not on file   Highest education level: Not on file  Occupational History   Not on file  Tobacco Use   Smoking status: Never   Smokeless tobacco: Never  Vaping Use   Vaping status: Never Used  Substance and Sexual Activity   Alcohol use: No    Alcohol/week: 0.0 standard drinks of alcohol   Drug use: No   Sexual activity: Not on file  Other Topics Concern   Not on file  Social History Narrative   Not on file   Social Determinants of Health   Financial Resource Strain: Not on file  Food Insecurity: No Food Insecurity (05/05/2023)   Hunger Vital Sign    Worried About Running Out of Food in the Last Year: Never true    Ran Out of Food in the Last Year: Never true  Transportation Needs: No Transportation Needs (05/05/2023)   PRAPARE - Administrator, Civil Service (Medical): No    Lack of Transportation (Non-Medical): No  Physical  Activity: Not on file  Stress: Not on file  Social Connections: Not on file  Intimate Partner Violence: Not At Risk (05/05/2023)   Humiliation, Afraid, Rape, and Kick questionnaire    Fear of Current or Ex-Partner: No    Emotionally Abused: No  Physically Abused: No    Sexually Abused: No     Family History  Problem Relation Age of Onset   Heart attack Mother    Stroke Mother    Heart failure Father        27   Hypertension Neg Hx     ROS: [x]  Positive   [ ]  Negative   [ ]  All sytems reviewed and are negative  Cardiac: []  chest pain/pressure []  hx MI [x]  PAF [x]  HTN/HLD [x]  ascending aortic dilation  Vascular: []  pain in legs while walking []  pain in legs at rest []  pain in legs at night []  non-healing ulcers []  hx of DVT []  swelling in legs  Pulmonary: []  asthma/wheezing []  home O2  Neurologic: []  hx of CVA []  mini stroke   Hematologic: [x]  hx of cancer-breast  Endocrine:   []  diabetes []  thyroid disease  GI []  GERD  GU: []  CKD/renal failure []  HD--[]  M/W/F or []  T/T/S  Psychiatric: []  anxiety []  depression  Musculoskeletal: [x]  arthritis []  joint pain  Integumentary: []  rashes []  ulcers  Constitutional: []  fever  []  chills  Physical Examination  Vitals:   05/05/23 1550 05/06/23 0814  BP: (!) 145/77 132/74  Pulse: 73 (!) 54  Resp: 18 18  Temp: (!) 97.4 F (36.3 C) 98.2 F (36.8 C)  SpO2: 98% 97%   Body mass index is 26.63 kg/m.  General:  WDWN in NAD Gait: Not observed HENT: WNL, normocephalic Pulmonary: normal non-labored breathing Cardiac: regular Abdomen:  soft, NT; aortic pulse is not palpable Skin: without rashes Extremities: bilateral feet are warm and well perfused Musculoskeletal: no muscle wasting or atrophy  Neurologic: A&O X 3 Psychiatric:  The pt has Normal affect.   CBC    Component Value Date/Time   WBC 8.2 05/06/2023 0047   RBC 4.51 05/06/2023 0047   HGB 13.0 05/06/2023 0047   HGB 13.5  07/10/2022 1027   HGB 14.4 08/09/2020 1153   HCT 38.8 (L) 05/06/2023 0047   HCT 44.5 08/09/2020 1153   PLT 207 05/06/2023 0047   PLT 218 07/10/2022 1027   PLT 359 08/09/2020 1153   MCV 86.0 05/06/2023 0047   MCV 86 08/09/2020 1153   MCH 28.8 05/06/2023 0047   MCHC 33.5 05/06/2023 0047   RDW 13.9 05/06/2023 0047   RDW 14.9 08/09/2020 1153   LYMPHSABS 2.0 05/05/2023 1045   MONOABS 0.8 05/05/2023 1045   EOSABS 0.1 05/05/2023 1045   BASOSABS 0.0 05/05/2023 1045    BMET    Component Value Date/Time   NA 139 05/06/2023 0047   NA 142 08/09/2020 1153   K 3.9 05/06/2023 0047   CL 107 05/06/2023 0047   CO2 26 05/06/2023 0047   GLUCOSE 96 05/06/2023 0047   BUN 19 05/06/2023 0047   BUN 19 08/09/2020 1153   CREATININE 1.18 05/06/2023 0047   CREATININE 1.14 07/10/2022 1027   CREATININE 1.47 (H) 08/24/2015 1257   CALCIUM 8.5 (L) 05/06/2023 0047   GFRNONAA >60 05/06/2023 0047   GFRNONAA >60 07/10/2022 1027   GFRAA 54 (L) 08/09/2020 1153    COAGS: Lab Results  Component Value Date   INR 1.2 05/05/2023   INR 1.3 (H) 07/23/2020   INR 1.0 05/17/2020     Non-Invasive Vascular Imaging:   CTA head/neck 05/05/2023: IMPRESSION: 1. No acute intracranial abnormality. Unchanged moderate chronic small-vessel disease. 2. No large vessel occlusion. 3. 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 4. 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 5. 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 Recommend ENT referral.    ASSESSMENT/PLAN: This is a 77 y.o. male with symptomatic right ICA stenosis   -pt with symptomatic right ICA stenosis.  His symptoms have resolved.  His is on a baby asa and statin as well as Eliquis.  He will need TCAR vs CEA and I briefly discussed both with he and his wife.  Dr. Lenell Antu to review CT scan and determine intervention.  He will be by to see the pt this afternoon. -most  likely OR next week.  He will need to be off his Eliquis.  -he is currently on asa/statin.  If he undergoes TCAR, he will need Plavix.  Doreatha Massed, PA-C [facility] [phone]  VASCULAR STAFF ADDENDUM: I have independently interviewed and examined the patient. I agree with the above.   Symptomatic right ICA stenosis causing 2x episodes of slurred speech, left facial droop. Workup initiated in ER included CT angiogram and MRI Brain. 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 Likely symptomatic lesion.   Patient has many excellent questions about treatment options. We reviewed TF-CAS, CEA, and TCAR. I ultimately recommended TCAR for him because of his favorable anatomy and symptomatic status.   We will start him on ASA / Plavix. He can continue his Eliquis until 48h prior to OR. We will look for elective time in the next two weeks. He is safe for discharge.   Rande Brunt. Lenell Antu, MD [facility] Phone Number: [phone] 05/06/2023 3:04 PM     I have completed Share Decision Making with Jacinta Shoe prior to surgery.  Conversations included: -Discussion of all treatment options including carotid endarterectomy (CEA), CAS (which includes transcarotid artery revascularization (TCAR)), and optimal medical therapy (OMT)). -Explanation of risks and benefits for each option specific to [employer] clinical situation. -Integration of clinical guidelines as it relates to the patient's history and co-morbidities -Discussion and incorporation of Jacinta Shoe and their personal preferences and priorities in choosing a treatment plan.

## 2023-05-06 NOTE — Progress Notes (Signed)
  Echocardiogram 2D Echocardiogram has been performed.  Logan Wright 05/06/2023, 3:13 PM

## 2023-05-06 NOTE — TOC Transition Note (Signed)
Transition of Care [facility]) - CM/SW Discharge Note   Patient Details  Name: Logan Wright MRN: [id number] Date of Birth: [birth year]  [facility] CM/SW Contact:  Kermit Balo, RN Phone Number: 05/06/2023, 2:37 PM   Clinical Narrative:     Pt is discharging home with self care.  Pt has transportation home.  Final next level of care: Home/Self Care Barriers to Discharge: No Barriers Identified   Patient Goals and CMS Choice      Discharge Placement                         Discharge Plan and Services Additional resources added to the After Visit Summary for                                       Social Determinants of Health (SDOH) Interventions SDOH Screenings   Food Insecurity: No Food Insecurity (05/05/2023)  Housing: Patient Declined (05/05/2023)  Transportation Needs: No Transportation Needs (05/05/2023)  Utilities: Not At Risk (05/05/2023)  Tobacco Use: Low Risk  (05/05/2023)     Readmission Risk Interventions     No data to display

## 2023-05-06 NOTE — Progress Notes (Signed)
Occupational Therapy Discharge Patient Details Name: Logan Wright MRN: [id number] DOB: [birth year] Today's Date: 05/06/2023 Time:  -     Patient discharged from OT services secondary to  resolved and at baseline. Spoke with PT and agree baseline at this time.  .  Please see latest therapy progress note for current level of functioning and progress toward goals.    Progress and discharge plan discussed with patient and/or caregiver: Patient/Caregiver agrees with plan  GO     Mateo Flow 05/06/2023, 10:10 AM

## 2023-05-06 NOTE — Evaluation (Signed)
SLP Cancellation Note  Patient Details Name: ISAC LINCKS MRN: [id number] DOB: 01-Jul-1946   Cancelled treatment:       Reason Eval/Treat Not Completed: Other (comment);SLP screened, no needs identified, will sign off (per neuro notes, therapy evaluations not indicated as pt at baseline function)  Rolena Infante, MS [facility] SLP Acute Rehab Services Office [phone]  Chales Abrahams 05/06/2023, 7:35 AM

## 2023-05-06 NOTE — Progress Notes (Signed)
Attempted Echocardiogram, patient went to MRI.

## 2023-05-06 NOTE — Progress Notes (Addendum)
STROKE TEAM PROGRESS NOTE   BRIEF HPI [name] is a 77 y.o. 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 presented to the hospital with slurred speech, left facial droop, which lasted about 15 seconds and resolved.  He had similar sx on a cruise in Early July and he states it resolved as quickly as he could tell his wife.  He followed up with his PCP and an MRI of the brain was ordered, which revealed a small acute to early subacute infarct in the right parietal sub-cortical white matter.   He did have a CTA of the head/neck on 05/05/2023, which revealed ~ 80% stenosis of the proximal right cervical ICA. He was started on ASA 81mg  by his PCP.    SIGNIFICANT HOSPITAL EVENTS 7/23- Admitted after reoccurring symptoms lasting about 15 seconds  INTERIM HISTORY/SUBJECTIVE Vascular surgery consulted and considering CEA vs TCAR early next week. Symptoms have fully resolved. Neurologically stable, states he is at his baseline. Left eyelid ptosis at baseline  OBJECTIVE  CBC    Component Value Date/Time   WBC 8.2 05/06/2023 0047   RBC 4.51 05/06/2023 0047   HGB 13.0 05/06/2023 0047   HGB 13.5 07/10/2022 1027   HGB 14.4 08/09/2020 1153   HCT 38.8 (L) 05/06/2023 0047   HCT 44.5 08/09/2020 1153   PLT 207 05/06/2023 0047   PLT 218 07/10/2022 1027   PLT 359 08/09/2020 1153   MCV 86.0 05/06/2023 0047   MCV 86 08/09/2020 1153   MCH 28.8 05/06/2023 0047   MCHC 33.5 05/06/2023 0047   RDW 13.9 05/06/2023 0047   RDW 14.9 08/09/2020 1153   LYMPHSABS 2.0 05/05/2023 1045   MONOABS 0.8 05/05/2023 1045   EOSABS 0.1 05/05/2023 1045   BASOSABS 0.0 05/05/2023 1045    BMET    Component Value Date/Time   NA 139  05/06/2023 0047   NA 142 08/09/2020 1153   K 3.9 05/06/2023 0047   CL 107 05/06/2023 0047   CO2 26 05/06/2023 0047   GLUCOSE 96 05/06/2023 0047   BUN 19 05/06/2023 0047   BUN 19 08/09/2020 1153   CREATININE 1.18 05/06/2023 0047   CREATININE 1.14 07/10/2022 1027   CREATININE 1.47 (H) 08/24/2015 1257   CALCIUM 8.5 (L) 05/06/2023 0047   GFRNONAA >60 05/06/2023 0047   GFRNONAA >60 07/10/2022 1027    IMAGING past 24 hours CT ANGIO HEAD NECK W WO CM  Result Date: 05/05/2023 CLINICAL DATA:  Transient ischemic attack. Left-sided facial droop and tingling with slurred speech. EXAM: CT ANGIOGRAPHY HEAD AND NECK WITH AND WITHOUT CONTRAST TECHNIQUE: Multidetector CT imaging of the head and neck was performed using the standard protocol during bolus administration of intravenous contrast. Multiplanar CT image reconstructions and MIPs were obtained to evaluate the vascular anatomy. Carotid stenosis measurements (when applicable) are obtained utilizing NASCET criteria, using the distal internal carotid diameter as the denominator. RADIATION DOSE REDUCTION: This exam was performed according to the departmental dose-optimization program which includes automated exposure control, adjustment of the mA and/or kV according to patient size and/or use of iterative reconstruction technique. CONTRAST:  75mL OMNIPAQUE IOHEXOL 350 MG/ML SOLN COMPARISON:  MRI brain 04/29/2023. FINDINGS: CT HEAD FINDINGS Brain: No acute hemorrhage. Unchanged moderate chronic small-vessel disease. Cortical gray-white differentiation is otherwise preserved.  Prominence of the ventricles and sulci within expected range for age. No hydrocephalus or extra-axial collection. No mass effect or midline shift. Vascular: No hyperdense vessel or unexpected calcification. Skull: No calvarial fracture or suspicious bone lesion. Skull base is unremarkable. Sinuses/Orbits: Unremarkable. Other: None. Review of the MIP images confirms the above findings CTA  NECK FINDINGS Aortic arch: Three-vessel arch configuration. Atherosclerotic calcifications of the aortic arch and arch vessel origins. Arch vessel origins are patent. 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 Left carotid system: No evidence of dissection, stenosis (50% or greater), or occlusion. Moderate calcified plaque along the left carotid bulb and proximal left cervical ICA. Vertebral arteries: Codominant. No evidence of dissection, stenosis (50% or greater), or occlusion. 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 Other neck: 1.6 cm hyperattenuating mass in the left parotid gland (axial image 86 series 8). Upper chest: Unremarkable. Review of the MIP images confirms the above findings CTA HEAD FINDINGS Anterior circulation: Calcified plaque along the carotid siphons without hemodynamically significant stenosis. 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 The proximal ACAs and MCAs are patent without stenosis or aneurysm. Distal branches are symmetric. Posterior circulation: Mild irregular narrowing of the basilar artery, likely atherosclerotic in etiology. The SCAs, AICAs and PICAs are patent proximally. The PCAs are patent proximally without stenosis or aneurysm. Distal branches are symmetric. Venous sinuses: As permitted by contrast timing, patent. Anatomic variants: None. Review of the MIP images confirms the above findings IMPRESSION: 1. No acute intracranial abnormality. Unchanged moderate chronic small-vessel disease. 2. No large vessel occlusion. 3. 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 4. 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 5. 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 Recommend ENT referral. Aortic Atherosclerosis (ICD10-I70.0). Electronically Signed   By: Orvan Falconer M.D.   On: 05/05/2023 12:33    Vitals:   05/05/23 1500 05/05/23 1504 05/05/23 1550 05/06/23 0814  BP: 125/70  (!) 145/77 132/74  Pulse: 77  73 (!) 54  Resp: 19  18 18   Temp:  98.2 F (36.8 C) (!) 97.4 F (36.3 C) 98.2 F (36.8 C)  TempSrc:  Oral Oral Oral  SpO2: 94%  98% 97%  Weight:      Height:         PHYSICAL EXAM General:  Alert, well-nourished, well-developed patient in no acute distress Psych:  Mood and affect appropriate for situation CV: Regular rate and rhythm on monitor Respiratory:  Regular, unlabored respirations on room air GI: Abdomen soft and nontender   NEURO:  Mental Status: AA&Ox3, patient is able to give clear and coherent history Speech/Language: speech is without dysarthria or aphasia.  Naming, repetition, fluency, and comprehension intact.  Cranial Nerves:  II: PERRL. Visual fields full.  III, IV, VI: EOMI.Left eyelid droop at baseline V: Sensation is intact to light touch and symmetrical to face.  VII: Face is symmetrical resting and smiling VIII: hearing intact to voice. IX, X: Palate elevates symmetrically. Phonation is normal.  YQ:[id number] shrug 5/5. XII: tongue is midline without fasciculations. Motor: 5/5 strength to all muscle groups tested.  Tone: is normal and bulk is normal Sensation- Intact to light touch bilaterally. Extinction absent to light touch to DSS.   Coordination: FTN intact bilaterally, HKS: no ataxia in BLE.No drift.  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 Unchanged moderate chronic small-vessel disease. CTA head & neck Approximately 80% stenosis of the proximal right cervical ICA.  7/17- MRI  Small acute to early subacute infarct in the right parietal subcortical white matter. Small remote lacunar infarcts in the right frontal subcortical white matter and bilateral centrum  semiovale with background moderate chronic small-vessel ischemic changes. Patent intracranial vasculature with no proximal stenosis or occlusion. 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 Probable hemodynamically significant stenosis at the right ICA bifurcation, suboptimally evaluated due to motion artifact.  7/23- MRI stable 2D Echo Pending  LDL 76 HgbA1c 5.5 VTE prophylaxis - Eliquis Eliquis (apixaban) daily prior to admission, now on aspirin 81 mg daily and Eliquis (apixaban) daily  Therapy recommendations:  No follow up needed Disposition:  Pending VVS evaluation  Right ICA stenosis CTA shows 80% stenosis of the proximal right cervical ICA  Atrial fibrillation Home Meds: Eliquis  Continue telemetry monitoring Rate control with diltiazem  Hypertension Home meds:  None Stable Blood Pressure Goal: BP less than 220/110  Measures blood pressure daily at home  Hyperlipidemia Home meds:  Atorvastatin, resumed in hospital LDL 76, goal < 70 Continue statin at discharge  Hospital day # 0  Patient seen and examined by NP/APP with MD. MD to update note as needed.   Elmer Picker, DNP, FNP-BC [facility] Pager: [phone]  ATTENDING ATTESTATION:  Small right parietal CVA with proximal right carotid symptomatic stenosis at 80%. Vascular surgery recommends CEA vs TCAR probably next week.  Ok to discharge if echo is neg. His exam is negative.  He did mention callous sensation in b/l feet, maybe mild sensory neuropathy, can address in outpt clinic with possible NCS if he desires.  Con't eliquis and aspirin 81mg  for stroke prevention. No needs per PT/OT.   F/u in stroke clinic with Dr. Pearlean Brownie in 8 weeks.  Dr. Viviann Spare evaluated pt independently, reviewed imaging, chart, labs. Discussed and formulated plan with the Resident/APP. Changes were made to the note where appropriate. Please see APP/resident note above for details.     Jaylin Benzel,MD    To contact  Stroke Continuity provider, please refer to [email or web address]. After hours, contact General Neurology

## 2023-05-06 NOTE — Plan of Care (Signed)
  Problem: Ischemic Stroke/TIA Tissue Perfusion: Goal: Complications of ischemic stroke/TIA will be minimized Outcome: Progressing   

## 2023-05-06 NOTE — Plan of Care (Signed)
  Problem: Education: Goal: Knowledge of General Education information will improve Description Including pain rating scale, medication(s)/side effects and non-pharmacologic comfort measures Outcome: Progressing   

## 2023-05-06 NOTE — Discharge Summary (Signed)
Physician Discharge Summary  BANNER HUCKABA ZOX:[id number] DOB: [birth year] DOA: 05/05/2023  PCP: Jackelyn Poling, DO  Admit date: 05/05/2023 Discharge date: 05/06/2023  Admitted From: Home Disposition: Home  Recommendations for Outpatient Follow-up:  Follow up with PCP in 1-2 weeks Follow-up with vascular surgery as scheduled, hold Eliquis 48 hours prior to vascular procedure as discussed, vascular surgery will indicate when to restart medication postoperatively.  Home Health: None Equipment/Devices: None  Discharge Condition: Stable CODE STATUS: Full Diet recommendation: Low-salt low-fat low-carb diet  Brief/Interim Summary: Logan Wright is a 77 y.o. male with medical history significant of A-fib, hypertension, HLD, ascending aorta, right breast cancer, CAD, presents to [facility] due to transient strokelike symptoms including left facial droop numbness tingling and slurred speech.  Similar episode July 6 earlier this month on a cruise ship only lasting a few seconds that resolved.  Outpatient MRI at that time showed small acute to early subacute infarct in the right parietal subcortical white matter.  Patient placed on Eliquis and subsequently on aspirin 81 mg just prior to this hospitalization and event.  During hospitalization patient had notably stenosed right ICA at 80%.  Vascular surgery consulted plan for TCAR in the next week per their schedule, patient educated to hold Eliquis 48 hours prior to procedure and to resume once cleared by vascular surgery to do so otherwise patient has been initiated on aspirin Plavix and Eliquis until time of surgery.  Patient otherwise stable and agreeable for discharge home, recommend close monitoring for signs or symptoms of bleeding and report back to the hospital at the earliest sign of any bleeding, easy bruising melena or fatigue.    Discharge Diagnoses:  Principal Problem:   TIA (transient ischemic attack)    Discharge  Instructions   Allergies as of 05/06/2023   No Known Allergies      Medication List     TAKE these medications    allopurinol 100 MG tablet Commonly known as: ZYLOPRIM Take 200 mg by mouth daily.   apixaban 5 MG Tabs tablet Commonly known as: Eliquis Take 1 tablet (5 mg total) by mouth 2 (two) times daily. Hold this medication 48 prior to vascular surgery. What changed:  how much to take additional instructions   aspirin EC 81 MG tablet Take 81 mg by mouth daily. Swallow whole.   atorvastatin 40 MG tablet Commonly known as: LIPITOR Take 40 mg by mouth at bedtime.   clopidogrel 75 MG tablet Commonly known as: PLAVIX Take 1 tablet (75 mg total) by mouth daily.   diltiazem 120 MG 24 hr capsule Commonly known as: CARDIZEM CD Take 1 capsule (120 mg total) by mouth daily.   Fish Oil Ultra 1400 MG Caps Take 1,400-2,800 mg by mouth See admin instructions. Take 1400 mg in the morning and 2800 mg in the evening   GLUCOSAMINE-CHONDROITIN PO Take 2 tablets by mouth daily.   multivitamin tablet Take 1 tablet by mouth daily.   Potassium 99 MG Tabs Take 1 tablet by mouth daily.   tamoxifen 20 MG tablet Commonly known as: NOLVADEX Take 1 tablet (20 mg total) by mouth daily.        No Known Allergies  Consultations: Neurology, vascular  Procedures/Studies: MR BRAIN WO CONTRAST  Result Date: 05/06/2023 CLINICAL DATA:  Stroke, follow up. Facial tingling, slurred speech and left-sided facial droop. EXAM: MRI HEAD WITHOUT CONTRAST TECHNIQUE: Multiplanar, multiecho pulse sequences of the brain and surrounding structures were obtained without intravenous contrast. COMPARISON:  CTA head/neck 05/05/2023.  MRI brain 04/19/2023. FINDINGS: Brain: No acute infarct or hemorrhage. Unchanged background of moderate chronic small-vessel disease. No mass or midline shift. No hydrocephalus or extra-axial collection. No abnormal susceptibility. Vascular: Normal flow voids. Skull and  upper cervical spine: Normal marrow signal. Sinuses/Orbits: Unremarkable. Other: None. IMPRESSION: 1. No acute intracranial process. 2. Unchanged background of moderate chronic small-vessel disease. Electronically Signed   By: Orvan Falconer M.D.   On: 05/06/2023 10:50   CT ANGIO HEAD NECK W WO CM  Result Date: 05/05/2023 CLINICAL DATA:  Transient ischemic attack. Left-sided facial droop and tingling with slurred speech. EXAM: CT ANGIOGRAPHY HEAD AND NECK WITH AND WITHOUT CONTRAST TECHNIQUE: Multidetector CT imaging of the head and neck was performed using the standard protocol during bolus administration of intravenous contrast. Multiplanar CT image reconstructions and MIPs were obtained to evaluate the vascular anatomy. Carotid stenosis measurements (when applicable) are obtained utilizing NASCET criteria, using the distal internal carotid diameter as the denominator. RADIATION DOSE REDUCTION: This exam was performed according to the departmental dose-optimization program which includes automated exposure control, adjustment of the mA and/or kV according to patient size and/or use of iterative reconstruction technique. CONTRAST:  75mL OMNIPAQUE IOHEXOL 350 MG/ML SOLN COMPARISON:  MRI brain 04/29/2023. FINDINGS: CT HEAD FINDINGS Brain: No acute hemorrhage. Unchanged moderate chronic small-vessel disease. Cortical gray-white differentiation is otherwise preserved. Prominence of the ventricles and sulci within expected range for age. No hydrocephalus or extra-axial collection. No mass effect or midline shift. Vascular: No hyperdense vessel or unexpected calcification. Skull: No calvarial fracture or suspicious bone lesion. Skull base is unremarkable. Sinuses/Orbits: Unremarkable. Other: None. Review of the MIP images confirms the above findings CTA NECK FINDINGS Aortic arch: Three-vessel arch configuration. Atherosclerotic calcifications of the aortic arch and arch vessel origins. Arch vessel origins are patent.  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 Left carotid system: No evidence of dissection, stenosis (50% or greater), or occlusion. Moderate calcified plaque along the left carotid bulb and proximal left cervical ICA. Vertebral arteries: Codominant. No evidence of dissection, stenosis (50% or greater), or occlusion. 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 Other neck: 1.6 cm hyperattenuating mass in the left parotid gland (axial image 86 series 8). Upper chest: Unremarkable. Review of the MIP images confirms the above findings CTA HEAD FINDINGS Anterior circulation: Calcified plaque along the carotid siphons without hemodynamically significant stenosis. 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 The proximal ACAs and MCAs are patent without stenosis or aneurysm. Distal branches are symmetric. Posterior circulation: Mild irregular narrowing of the basilar artery, likely atherosclerotic in etiology. The SCAs, AICAs and PICAs are patent proximally. The PCAs are patent proximally without stenosis or aneurysm. Distal branches are symmetric. Venous sinuses: As permitted by contrast timing, patent. Anatomic variants: None. Review of the MIP images confirms the above findings IMPRESSION: 1. No acute intracranial abnormality. Unchanged moderate chronic small-vessel disease. 2. No large vessel occlusion. 3. 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 4. 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 5. 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 Recommend ENT referral. Aortic Atherosclerosis (ICD10-I70.0). Electronically Signed   By: Orvan Falconer M.D.   On: 05/05/2023 12:33   MR BRAIN WO CONTRAST  Result Date: 05/01/2023 CLINICAL DATA:  Episode of left facial tingling 3 weeks ago. Drooping of cheek.  EXAM: MRI HEAD WITHOUT CONTRAST  MRA HEAD WITHOUT CONTRAST MRA NECK WITHOUT CONTRAST TECHNIQUE: Multiplanar, multiecho pulse sequences of the brain and surrounding structures were obtained without intravenous contrast. Angiographic images of the Circle of Willis were obtained using MRA technique without intravenous contrast. Angiographic images of the neck were obtained using MRA technique without intravenous contrast. Carotid stenosis measurements (when applicable) are obtained utilizing NASCET criteria, using the distal internal carotid diameter as the denominator. COMPARISON:  None Available. FINDINGS: MRI HEAD FINDINGS Brain: There is a small focus of elevated DWI signal in the right parietal subcortical white matter with faintly elevated FLAIR signal consistent with a small acute to early subacute infarct (15-39). There is a small remote lacunar infarct in the right frontal subcortical white matter with associated T2 shine through at the periphery (15-38, 20-38). There are additional small remote lacunar infarcts infarct in the bilateral centrum semiovale. There is no acute intracranial hemorrhage or extra-axial fluid collection. Background parenchymal volume is within expected limits for age. The ventricles are normal in size. Patchy FLAIR signal abnormality in the remainder of the supratentorial white matter is consistent with moderate underlying chronic small-vessel ischemic change. The pituitary and suprasellar region are normal. There is no mass lesion. There is no mass effect or midline shift. Vascular: See below. Skull and upper cervical spine: Normal marrow signal. Sinuses/Orbits: The paranasal sinuses are clear. A right lens implant is noted. The globes and orbits are otherwise unremarkable. Other: The mastoid air cells and middle ear cavities are clear.There is a 1.6 cm x 1.1 cm cystic structure overlying the left parotid gland. MRA HEAD FINDINGS Anterior circulation: The intracranial ICAs are patent,  without significant stenosis or occlusion. The bilateral MCAs and ACAS are patent, without proximal stenosis or occlusion. 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 Posterior circulation: The bilateral V4 segments are patent. The basilar artery is patent. The major cerebellar arteries appear patent. The bilateral PCAs are patent, without proximal stenosis or occlusion. Posterior communicating arteries are not identified There is no posterior circulation aneurysm. Anatomic variants: None. MRA NECK FINDINGS Aortic arch: The imaged aortic arch is normal. The origins of the major branch vessels appear patent. Right carotid system: The right common carotid artery is patent. There is motion artifact of the level of the bifurcation, limiting evaluation. There is probable hemodynamically significant stenosis in the proximal internal carotid artery. The distal internal carotid artery is patent. There is no convincing evidence of aneurysm or dissection. Left carotid system: The left common, internal, and external carotid arteries appear patent, without evidence of hemodynamically significant stenosis or occlusion there is no evidence of dissection or aneurysm. Vertebral arteries: The vertebral arteries are patent with antegrade flow. There is no evidence of hemodynamically significant stenosis or occlusion there is no evidence of dissection or aneurysm. Other: None. IMPRESSION: 1. 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 2. Small remote lacunar infarcts in the right frontal subcortical white matter and bilateral centrum semiovale with background moderate chronic small-vessel ischemic changes. 3. Patent intracranial vasculature with no proximal stenosis or occlusion. 4. 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 5. Probable hemodynamically significant stenosis at the right ICA bifurcation, suboptimally evaluated due to motion  artifact. Consider CTA neck to better evaluate the degree of stenosis. 6. 1.6 cm cystic structure overlying the left parotid gland is indeterminate but favored benign. Recommend correlation with physical exam, with repeat imaging or ENT referral as needed if this  lesion grows clinically. These results will be called to the ordering clinician or representative by the Radiologist Assistant, and communication documented in the PACS or [employer]. Electronically Signed   By: Lesia Hausen M.D.   On: 05/01/2023 09:15   MR ANGIO HEAD WO CONTRAST  Result Date: 05/01/2023 CLINICAL DATA:  Episode of left facial tingling 3 weeks ago. Drooping of cheek. EXAM: MRI HEAD WITHOUT CONTRAST MRA HEAD WITHOUT CONTRAST MRA NECK WITHOUT CONTRAST TECHNIQUE: Multiplanar, multiecho pulse sequences of the brain and surrounding structures were obtained without intravenous contrast. Angiographic images of the Circle of Willis were obtained using MRA technique without intravenous contrast. Angiographic images of the neck were obtained using MRA technique without intravenous contrast. Carotid stenosis measurements (when applicable) are obtained utilizing NASCET criteria, using the distal internal carotid diameter as the denominator. COMPARISON:  None Available. FINDINGS: MRI HEAD FINDINGS Brain: There is a small focus of elevated DWI signal in the right parietal subcortical white matter with faintly elevated FLAIR signal consistent with a small acute to early subacute infarct (15-39). There is a small remote lacunar infarct in the right frontal subcortical white matter with associated T2 shine through at the periphery (15-38, 20-38). There are additional small remote lacunar infarcts infarct in the bilateral centrum semiovale. There is no acute intracranial hemorrhage or extra-axial fluid collection. Background parenchymal volume is within expected limits for age. The ventricles are normal in size. Patchy FLAIR signal abnormality in  the remainder of the supratentorial white matter is consistent with moderate underlying chronic small-vessel ischemic change. The pituitary and suprasellar region are normal. There is no mass lesion. There is no mass effect or midline shift. Vascular: See below. Skull and upper cervical spine: Normal marrow signal. Sinuses/Orbits: The paranasal sinuses are clear. A right lens implant is noted. The globes and orbits are otherwise unremarkable. Other: The mastoid air cells and middle ear cavities are clear.There is a 1.6 cm x 1.1 cm cystic structure overlying the left parotid gland. MRA HEAD FINDINGS Anterior circulation: The intracranial ICAs are patent, without significant stenosis or occlusion. The bilateral MCAs and ACAS are patent, without proximal stenosis or occlusion. 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 Posterior circulation: The bilateral V4 segments are patent. The basilar artery is patent. The major cerebellar arteries appear patent. The bilateral PCAs are patent, without proximal stenosis or occlusion. Posterior communicating arteries are not identified There is no posterior circulation aneurysm. Anatomic variants: None. MRA NECK FINDINGS Aortic arch: The imaged aortic arch is normal. The origins of the major branch vessels appear patent. Right carotid system: The right common carotid artery is patent. There is motion artifact of the level of the bifurcation, limiting evaluation. There is probable hemodynamically significant stenosis in the proximal internal carotid artery. The distal internal carotid artery is patent. There is no convincing evidence of aneurysm or dissection. Left carotid system: The left common, internal, and external carotid arteries appear patent, without evidence of hemodynamically significant stenosis or occlusion there is no evidence of dissection or aneurysm. Vertebral arteries: The vertebral arteries are patent with antegrade  flow. There is no evidence of hemodynamically significant stenosis or occlusion there is no evidence of dissection or aneurysm. Other: None. IMPRESSION: 1. 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 2. Small remote lacunar infarcts in the right frontal subcortical white matter and bilateral centrum semiovale with background moderate chronic small-vessel ischemic changes. 3. Patent intracranial vasculature with  no proximal stenosis or occlusion. 4. 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 5. Probable hemodynamically significant stenosis at the right ICA bifurcation, suboptimally evaluated due to motion artifact. Consider CTA neck to better evaluate the degree of stenosis. 6. 1.6 cm cystic structure overlying the left parotid gland is indeterminate but favored benign. Recommend correlation with physical exam, with repeat imaging or ENT referral as needed if this lesion grows clinically. These results will be called to the ordering clinician or representative by the Radiologist Assistant, and communication documented in the PACS or [employer]. Electronically Signed   By: Lesia Hausen M.D.   On: 05/01/2023 09:15   MR ANGIO NECK WO CONTRAST  Result Date: 05/01/2023 CLINICAL DATA:  Episode of left facial tingling 3 weeks ago. Drooping of cheek. EXAM: MRI HEAD WITHOUT CONTRAST MRA HEAD WITHOUT CONTRAST MRA NECK WITHOUT CONTRAST TECHNIQUE: Multiplanar, multiecho pulse sequences of the brain and surrounding structures were obtained without intravenous contrast. Angiographic images of the Circle of Willis were obtained using MRA technique without intravenous contrast. Angiographic images of the neck were obtained using MRA technique without intravenous contrast. Carotid stenosis measurements (when applicable) are obtained utilizing NASCET criteria, using the distal internal carotid diameter as the denominator. COMPARISON:  None Available. FINDINGS: MRI HEAD  FINDINGS Brain: There is a small focus of elevated DWI signal in the right parietal subcortical white matter with faintly elevated FLAIR signal consistent with a small acute to early subacute infarct (15-39). There is a small remote lacunar infarct in the right frontal subcortical white matter with associated T2 shine through at the periphery (15-38, 20-38). There are additional small remote lacunar infarcts infarct in the bilateral centrum semiovale. There is no acute intracranial hemorrhage or extra-axial fluid collection. Background parenchymal volume is within expected limits for age. The ventricles are normal in size. Patchy FLAIR signal abnormality in the remainder of the supratentorial white matter is consistent with moderate underlying chronic small-vessel ischemic change. The pituitary and suprasellar region are normal. There is no mass lesion. There is no mass effect or midline shift. Vascular: See below. Skull and upper cervical spine: Normal marrow signal. Sinuses/Orbits: The paranasal sinuses are clear. A right lens implant is noted. The globes and orbits are otherwise unremarkable. Other: The mastoid air cells and middle ear cavities are clear.There is a 1.6 cm x 1.1 cm cystic structure overlying the left parotid gland. MRA HEAD FINDINGS Anterior circulation: The intracranial ICAs are patent, without significant stenosis or occlusion. The bilateral MCAs and ACAS are patent, without proximal stenosis or occlusion. 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 Posterior circulation: The bilateral V4 segments are patent. The basilar artery is patent. The major cerebellar arteries appear patent. The bilateral PCAs are patent, without proximal stenosis or occlusion. Posterior communicating arteries are not identified There is no posterior circulation aneurysm. Anatomic variants: None. MRA NECK FINDINGS Aortic arch: The imaged aortic arch is normal. The origins  of the major branch vessels appear patent. Right carotid system: The right common carotid artery is patent. There is motion artifact of the level of the bifurcation, limiting evaluation. There is probable hemodynamically significant stenosis in the proximal internal carotid artery. The distal internal carotid artery is patent. There is no convincing evidence of aneurysm or dissection. Left carotid system: The left common, internal, and external carotid arteries appear patent, without evidence of hemodynamically significant stenosis or occlusion there is no evidence of dissection or aneurysm. Vertebral arteries:  The vertebral arteries are patent with antegrade flow. There is no evidence of hemodynamically significant stenosis or occlusion there is no evidence of dissection or aneurysm. Other: None. IMPRESSION: 1. 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 2. Small remote lacunar infarcts in the right frontal subcortical white matter and bilateral centrum semiovale with background moderate chronic small-vessel ischemic changes. 3. Patent intracranial vasculature with no proximal stenosis or occlusion. 4. 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 5. Probable hemodynamically significant stenosis at the right ICA bifurcation, suboptimally evaluated due to motion artifact. Consider CTA neck to better evaluate the degree of stenosis. 6. 1.6 cm cystic structure overlying the left parotid gland is indeterminate but favored benign. Recommend correlation with physical exam, with repeat imaging or ENT referral as needed if this lesion grows clinically. These results will be called to the ordering clinician or representative by the Radiologist Assistant, and communication documented in the PACS or [employer]. Electronically Signed   By: Lesia Hausen M.D.   On: 05/01/2023 09:15     Subjective: No acute issues or events overnight   Discharge Exam: Vitals:   05/05/23  1550 05/06/23 0814  BP: (!) 145/77 132/74  Pulse: 73 (!) 54  Resp: 18 18  Temp: (!) 97.4 F (36.3 C) 98.2 F (36.8 C)  SpO2: 98% 97%   Vitals:   05/05/23 1500 05/05/23 1504 05/05/23 1550 05/06/23 0814  BP: 125/70  (!) 145/77 132/74  Pulse: 77  73 (!) 54  Resp: 19  18 18   Temp:  98.2 F (36.8 C) (!) 97.4 F (36.3 C) 98.2 F (36.8 C)  TempSrc:  Oral Oral Oral  SpO2: 94%  98% 97%  Weight:      Height:        General: Pt is alert, awake, not in acute distress Cardiovascular: RRR, S1/S2 +, no rubs, no gallops Respiratory: CTA bilaterally, no wheezing, no rhonchi Abdominal: Soft, NT, ND, bowel sounds + Extremities: no edema, no cyanosis    The results of significant diagnostics from this hospitalization (including imaging, microbiology, ancillary and laboratory) are listed below for reference.     Microbiology: No results found for this or any previous visit (from the past 240 hour(s)).   Labs:  Basic Metabolic Panel: Recent Labs  Lab 05/05/23 1045 05/06/23 0047  NA 138 139  K 4.1 3.9  CL 106 107  CO2 23 26  GLUCOSE 109* 96  BUN 25* 19  CREATININE 1.23 1.18  CALCIUM 8.8* 8.5*   Liver Function Tests: Recent Labs  Lab 05/05/23 1045  AST 25  ALT 28  ALKPHOS 77  BILITOT 0.4  PROT 6.6  ALBUMIN 3.8   CBC: Recent Labs  Lab 05/05/23 1045 05/06/23 0047  WBC 7.8 8.2  NEUTROABS 4.9  --   HGB 14.2 13.0  HCT 42.6 38.8*  MCV 86.2 86.0  PLT 222 207   CBG: Recent Labs  Lab 05/05/23 1045  GLUCAP 101*   D-Dimer No results for input(s): "DDIMER" in the last 72 hours. Hgb A1c Recent Labs    05/06/23 0047  HGBA1C 5.5   Lipid Profile Recent Labs    05/06/23 0047  CHOL 130  HDL 36*  LDLCALC 76  TRIG 88  CHOLHDL 3.6   Urinalysis    Component Value Date/Time   COLORURINE STRAW (A) 05/05/2023 1054   APPEARANCEUR CLEAR 05/05/2023 1054   LABSPEC <1.005 (L) 05/05/2023 1054   PHURINE 6.5 05/05/2023 1054  GLUCOSEU NEGATIVE 05/05/2023 1054    HGBUR NEGATIVE 05/05/2023 1054   BILIRUBINUR NEGATIVE 05/05/2023 1054   KETONESUR NEGATIVE 05/05/2023 1054   PROTEINUR NEGATIVE 05/05/2023 1054   NITRITE NEGATIVE 05/05/2023 1054   LEUKOCYTESUR NEGATIVE 05/05/2023 1054   Sepsis Labs Recent Labs  Lab 05/05/23 1045 05/06/23 0047  WBC 7.8 8.2   Microbiology No results found for this or any previous visit (from the past 240 hour(s)).   Time coordinating discharge: Over 30 minutes  SIGNED:   Azucena Fallen, DO [facility] 05/06/2023, 2:33 PM Pager   If 7PM-7AM, please contact night-coverage [email or web address]

## 2023-05-06 NOTE — Plan of Care (Signed)
  Problem: Education: Goal: Knowledge of General Education information will improve Description: Including pain rating scale, medication(s)/side effects and non-pharmacologic comfort measures Outcome: Adequate for Discharge   Problem: Health Behavior/Discharge Planning: Goal: Ability to manage health-related needs will improve Outcome: Adequate for Discharge   Problem: Clinical Measurements: Goal: Ability to maintain clinical measurements within normal limits will improve Outcome: Adequate for Discharge Goal: Will remain free from infection Outcome: Adequate for Discharge Goal: Diagnostic test results will improve Outcome: Adequate for Discharge Goal: Respiratory complications will improve Outcome: Adequate for Discharge Goal: Cardiovascular complication will be avoided Outcome: Adequate for Discharge   Problem: Activity: Goal: Risk for activity intolerance will decrease Outcome: Adequate for Discharge   Problem: Nutrition: Goal: Adequate nutrition will be maintained Outcome: Adequate for Discharge   Problem: Coping: Goal: Level of anxiety will decrease Outcome: Adequate for Discharge   Problem: Elimination: Goal: Will not experience complications related to bowel motility Outcome: Adequate for Discharge Goal: Will not experience complications related to urinary retention Outcome: Adequate for Discharge   Problem: Pain Managment: Goal: General experience of comfort will improve Outcome: Adequate for Discharge   Problem: Safety: Goal: Ability to remain free from injury will improve Outcome: Adequate for Discharge   Problem: Skin Integrity: Goal: Risk for impaired skin integrity will decrease Outcome: Adequate for Discharge   Problem: Education: Goal: Knowledge of disease or condition will improve Outcome: Adequate for Discharge Goal: Knowledge of secondary prevention will improve (MUST DOCUMENT ALL) Outcome: Adequate for Discharge Goal: Knowledge of patient  specific risk factors will improve Loraine Leriche N/A or DELETE if not current risk factor) Outcome: Adequate for Discharge   Problem: Ischemic Stroke/TIA Tissue Perfusion: Goal: Complications of ischemic stroke/TIA will be minimized 05/06/2023 1531 by Juluis Mire, RN Outcome: Adequate for Discharge 05/06/2023 1001 by Juluis Mire, RN Outcome: Progressing   Problem: Coping: Goal: Will verbalize positive feelings about self Outcome: Adequate for Discharge Goal: Will identify appropriate support needs Outcome: Adequate for Discharge   Problem: Health Behavior/Discharge Planning: Goal: Ability to manage health-related needs will improve Outcome: Adequate for Discharge Goal: Goals will be collaboratively established with patient/family Outcome: Adequate for Discharge   Problem: Self-Care: Goal: Ability to participate in self-care as condition permits will improve Outcome: Adequate for Discharge Goal: Verbalization of feelings and concerns over difficulty with self-care will improve Outcome: Adequate for Discharge Goal: Ability to communicate needs accurately will improve Outcome: Adequate for Discharge   Problem: Nutrition: Goal: Risk of aspiration will decrease Outcome: Adequate for Discharge Goal: Dietary intake will improve Outcome: Adequate for Discharge

## 2023-05-06 NOTE — H&P (View-Only) (Signed)
Hospital Consult    Reason for Consult:  symptomatic right ICA stenosis Requesting Physician:  Natale Milch MRN #:  [id number]  History of Present Illness: This is a 77 y.o. male who presented to the hospital with slurred speech, left facial droop, which lasted about 15 seconds and resolved.  He had similar sx on a cruise in Early July and he states it resolved as quickly as he could tell his wife.  He followed up with his PCP and an MRI of the brain was ordered, which revealed a small acute to early subacute infarct in the right parietal sub-cortical white matter.   He did have a CTA of the head/neck on 05/05/2023, which revealed ~ 80% stenosis of the proximal right cervical ICA.  He did not have any other sx.  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   Pt has hx of afib and on Eliquis.  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  hx of CAD, HLD, HTN and breast cancer.   He has hx of elevated creatinine in the past but creatinine today is 1.18.  He states that after his mastectomy, he did have some afib and on Eliquis.  He checks his heart rate and he has not seen any further afib.  He has cramping in at night but does not endorse claudication.  He and his wife of 55 years are very active and work out regularly.    He states that his parents have hx of CAD and stroke but lived to be in late 42's and 100.   The pt is on a statin for cholesterol management.  The pt is on a daily aspirin.   Other AC:  Eliquis The pt is on CCB for hypertension.   The pt is not on medication for diabetes PTA. Tobacco hx:  never  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   Echocardiogram 07/2020: 42 mm   Breast cancer (HCC) 2021   right breast   Breast cancer, right (HCC)    CKD (chronic kidney disease), stage III (HCC)     Colon adenoma 2015   Coronary artery calcification seen on CT scan    Diverticulosis    DENIES    DJD (degenerative joint disease)    ED (erectile dysfunction)    Elevated blood pressure 2007-2008   Esophageal reflux    GERD (gastroesophageal reflux disease)    Gout    Gynecomastia    LEFT   Hearing loss    HTN (hypertension)    Hypercholesteremia    Non-cardiac chest pain    OA (osteoarthritis)    LEFT SHOULDER   Onychomycosis of foot with other complication    PAF (paroxysmal atrial fibrillation) (HCC)    Pre-diabetes    Seborrhea    Severe frontal headaches    RESOLVED    Vertigo    RESOLVED    Wears glasses    Wears hearing aid     Past Surgical History:  Procedure Laterality Date   COLONSCOPY      HIP ARTHROPLASTY Bilateral    AT [place]    IR CV LINE INJECTION  05/14/2020   IR IMAGING GUIDED PORT INSERTION  05/17/2020   IR REMOVAL TUN ACCESS W/ PORT W/O FL MOD SED  05/17/2020   KNEE ARTHROSCOPY  UNSURE WHICH KNEE    MASTECTOMY Right 03/16/2020   MASTECTOMY W/ SENTINEL NODE BIOPSY Right 03/16/2020   Procedure: RIGHT BREAST MASTECTOMY WITH SENTINEL LYMPH NODE BIOPSY;  Surgeon: Abigail Miyamoto, MD;  Location: [facility];  Service: General;  Laterality: Right;   PORTACATH PLACEMENT Left 05/08/2020   Procedure: INSERTION PORT-A-CATH WITH ULTRASOUND;  Surgeon: Abigail Miyamoto, MD;  Location: [facility] OR;  Service: General;  Laterality: Left;  LMA   TOTAL KNEE ARTHROPLASTY Left 11/23/2018   Procedure: TOTAL KNEE ARTHROPLASTY;  Surgeon: Durene Romans, MD;  Location: [facility];  Service: Orthopedics;  Laterality: Left;  70 mins    No Known Allergies  Prior to Admission medications   Medication Sig Start Date End Date Taking? Authorizing Provider  allopurinol (ZYLOPRIM) 100 MG tablet Take 200 mg by mouth daily.   Yes [provider]  apixaban (ELIQUIS) 5 MG TABS tablet Take 1 tablet by mouth twice daily 11/07/22  Yes [name], Deloris Ping, MD   aspirin EC 81 MG tablet Take 81 mg by mouth daily. Swallow whole.   Yes [provider]  atorvastatin (LIPITOR) 40 MG tablet Take 40 mg by mouth at bedtime.    Yes [provider]  diltiazem (CARDIZEM CD) 120 MG 24 hr capsule Take 1 capsule (120 mg total) by mouth daily. 04/29/23  Yes [name], Deloris Ping, MD  GLUCOSAMINE-CHONDROITIN PO Take 2 tablets by mouth daily.   Yes [provider]  Multiple Vitamin (MULTIVITAMIN) tablet Take 1 tablet by mouth daily.    Yes [provider]  Omega-3 Fatty Acids (FISH OIL ULTRA) 1400 MG CAPS Take 1,400-2,800 mg by mouth See admin instructions. Take 1400 mg in the morning and 2800 mg in the evening   Yes [provider]  Potassium 99 MG TABS Take 1 tablet by mouth daily.   Yes [provider]  tamoxifen (NOLVADEX) 20 MG tablet Take 1 tablet (20 mg total) by mouth daily. 11/11/22  Yes Serena Croissant, MD    Social History   Socioeconomic History   Marital status: Married    Spouse name: Not on file   Number of children: Not on file   Years of education: Not on file   Highest education level: Not on file  Occupational History   Not on file  Tobacco Use   Smoking status: Never   Smokeless tobacco: Never  Vaping Use   Vaping status: Never Used  Substance and Sexual Activity   Alcohol use: No    Alcohol/week: 0.0 standard drinks of alcohol   Drug use: No   Sexual activity: Not on file  Other Topics Concern   Not on file  Social History Narrative   Not on file   Social Determinants of Health   Financial Resource Strain: Not on file  Food Insecurity: No Food Insecurity (05/05/2023)   Hunger Vital Sign    Worried About Running Out of Food in the Last Year: Never true    Ran Out of Food in the Last Year: Never true  Transportation Needs: No Transportation Needs (05/05/2023)   PRAPARE - Administrator, Civil Service (Medical): No    Lack of Transportation (Non-Medical): No  Physical  Activity: Not on file  Stress: Not on file  Social Connections: Not on file  Intimate Partner Violence: Not At Risk (05/05/2023)   Humiliation, Afraid, Rape, and Kick questionnaire    Fear of Current or Ex-Partner: No    Emotionally Abused: No  Physically Abused: No    Sexually Abused: No     Family History  Problem Relation Age of Onset   Heart attack Mother    Stroke Mother    Heart failure Father        6   Hypertension Neg Hx     ROS: [x]  Positive   [ ]  Negative   [ ]  All sytems reviewed and are negative  Cardiac: []  chest pain/pressure []  hx MI [x]  PAF [x]  HTN/HLD [x]  ascending aortic dilation  Vascular: []  pain in legs while walking []  pain in legs at rest []  pain in legs at night []  non-healing ulcers []  hx of DVT []  swelling in legs  Pulmonary: []  asthma/wheezing []  home O2  Neurologic: []  hx of CVA []  mini stroke   Hematologic: [x]  hx of cancer-breast  Endocrine:   []  diabetes []  thyroid disease  GI []  GERD  GU: []  CKD/renal failure []  HD--[]  M/W/F or []  T/T/S  Psychiatric: []  anxiety []  depression  Musculoskeletal: [x]  arthritis []  joint pain  Integumentary: []  rashes []  ulcers  Constitutional: []  fever  []  chills  Physical Examination  Vitals:   05/05/23 1550 05/06/23 0814  BP: (!) 145/77 132/74  Pulse: 73 (!) 54  Resp: 18 18  Temp: (!) 97.4 F (36.3 C) 98.2 F (36.8 C)  SpO2: 98% 97%   Body mass index is 26.63 kg/m.  General:  WDWN in NAD Gait: Not observed HENT: WNL, normocephalic Pulmonary: normal non-labored breathing Cardiac: regular Abdomen:  soft, NT; aortic pulse is not palpable Skin: without rashes Extremities: bilateral feet are warm and well perfused Musculoskeletal: no muscle wasting or atrophy  Neurologic: A&O X 3 Psychiatric:  The pt has Normal affect.   CBC    Component Value Date/Time   WBC 8.2 05/06/2023 0047   RBC 4.51 05/06/2023 0047   HGB 13.0 05/06/2023 0047   HGB 13.5  07/10/2022 1027   HGB 14.4 08/09/2020 1153   HCT 38.8 (L) 05/06/2023 0047   HCT 44.5 08/09/2020 1153   PLT 207 05/06/2023 0047   PLT 218 07/10/2022 1027   PLT 359 08/09/2020 1153   MCV 86.0 05/06/2023 0047   MCV 86 08/09/2020 1153   MCH 28.8 05/06/2023 0047   MCHC 33.5 05/06/2023 0047   RDW 13.9 05/06/2023 0047   RDW 14.9 08/09/2020 1153   LYMPHSABS 2.0 05/05/2023 1045   MONOABS 0.8 05/05/2023 1045   EOSABS 0.1 05/05/2023 1045   BASOSABS 0.0 05/05/2023 1045    BMET    Component Value Date/Time   NA 139 05/06/2023 0047   NA 142 08/09/2020 1153   K 3.9 05/06/2023 0047   CL 107 05/06/2023 0047   CO2 26 05/06/2023 0047   GLUCOSE 96 05/06/2023 0047   BUN 19 05/06/2023 0047   BUN 19 08/09/2020 1153   CREATININE 1.18 05/06/2023 0047   CREATININE 1.14 07/10/2022 1027   CREATININE 1.47 (H) 08/24/2015 1257   CALCIUM 8.5 (L) 05/06/2023 0047   GFRNONAA >60 05/06/2023 0047   GFRNONAA >60 07/10/2022 1027   GFRAA 54 (L) 08/09/2020 1153    COAGS: Lab Results  Component Value Date   INR 1.2 05/05/2023   INR 1.3 (H) 07/23/2020   INR 1.0 05/17/2020     Non-Invasive Vascular Imaging:   CTA head/neck 05/05/2023: IMPRESSION: 1. No acute intracranial abnormality. Unchanged moderate chronic small-vessel disease. 2. No large vessel occlusion. 3. 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 4. 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 5. 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 Recommend ENT referral.    ASSESSMENT/PLAN: This is a 77 y.o. male with symptomatic right ICA stenosis   -pt with symptomatic right ICA stenosis.  His symptoms have resolved.  His is on a baby asa and statin as well as Eliquis.  He will need TCAR vs CEA and I briefly discussed both with he and his wife.  Dr. Lenell Antu to review CT scan and determine intervention.  He will be by to see the pt this afternoon. -most  likely OR next week.  He will need to be off his Eliquis.  -he is currently on asa/statin.  If he undergoes TCAR, he will need Plavix.  Doreatha Massed, PA-C [facility] [phone]  VASCULAR STAFF ADDENDUM: I have independently interviewed and examined the patient. I agree with the above.   Symptomatic right ICA stenosis causing 2x episodes of slurred speech, left facial droop. Workup initiated in ER included CT angiogram and MRI Brain. 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 Likely symptomatic lesion.   Patient has many excellent questions about treatment options. We reviewed TF-CAS, CEA, and TCAR. I ultimately recommended TCAR for him because of his favorable anatomy and symptomatic status.   We will start him on ASA / Plavix. He can continue his Eliquis until 48h prior to OR. We will look for elective time in the next two weeks. He is safe for discharge.   Rande Brunt. Lenell Antu, MD [facility] Phone Number: [phone] 05/06/2023 3:04 PM     I have completed Share Decision Making with Jacinta Shoe prior to surgery.  Conversations included: -Discussion of all treatment options including carotid endarterectomy (CEA), CAS (which includes transcarotid artery revascularization (TCAR)), and optimal medical therapy (OMT)). -Explanation of risks and benefits for each option specific to [employer] clinical situation. -Integration of clinical guidelines as it relates to the patient's history and co-morbidities -Discussion and incorporation of Jacinta Shoe and their personal preferences and priorities in choosing a treatment plan.

## 2023-05-07 ENCOUNTER — Encounter: Payer: Self-pay | Admitting: *Deleted

## 2023-05-07 ENCOUNTER — Other Ambulatory Visit: Payer: Self-pay | Admitting: *Deleted

## 2023-05-07 DIAGNOSIS — I6521 Occlusion and stenosis of right carotid artery: Secondary | ICD-10-CM

## 2023-05-13 ENCOUNTER — Other Ambulatory Visit: Payer: Self-pay

## 2023-05-13 ENCOUNTER — Encounter (HOSPITAL_COMMUNITY): Payer: Self-pay | Admitting: Vascular Surgery

## 2023-05-13 NOTE — Progress Notes (Signed)
SDW call  Patient was given pre-op instructions over the phone. Patient verbalized understanding of instructions provided.     PCP - Dr. Jackelyn Poling Cardiologist - Dr. Kristeen Miss Pulmonary: denies   PPM/ICD - denies Device Orders - n/a Rep Notified - n/a   Chest x-ray - n/a EKG -  05/05/2023 Stress Test -08/17/2019 ECHO - 05/06/2023 Cardiac Cath -   Sleep Study/sleep apnea/CPAP: States had a sleep study years ago and was not diagnosed with sleep apnea  Non-diabetic   Blood Thinner Instructions: Eliquis, states last dose 05/11/2023.  Patient is to continue Plavix Aspirin Instructions: Patient is to continue ASA   ERAS Protcol - No, NPO   COVID TEST- n/a    Anesthesia review: Yes.  CAD, HTN, CKD, A-Fib, elevated cholesterol, descending aortic dilation   Patient denies shortness of breath, fever, cough and chest pain over the phone call  Your procedure is scheduled on Thursday, May 14, 2023  Report to [facility] Main Entrance "A" at 0820 A.M., then check in with the Admitting office.  Call this number if you have problems the morning of surgery:  [phone]   If you have any questions prior to your surgery date call [phone]: Open Monday-Friday 8am-4pm If you experience any cold or flu symptoms such as cough, fever, chills, shortness of breath, etc. between now and your scheduled surgery, please notify us at the above number    Remember:  Do not eat or drink after midnight the night before your surgery   Take these medicines the morning of surgery with A SIP OF WATER:  Allopurinol, ASA, plavix, diltiazem, tamoxifen As of today, STOP taking any Aleve, Naproxen, Ibuprofen, Motrin, Advil, Goody's, BC's, all herbal medications, fish oil, and all vitamins.

## 2023-05-13 NOTE — Anesthesia Preprocedure Evaluation (Signed)
Anesthesia Evaluation  Patient identified by MRN, date of birth, ID band Patient awake    Reviewed: Allergy & Precautions, NPO status , Patient's Chart, lab work & pertinent test results  Airway Mallampati: II  TM Distance: >3 FB Neck ROM: Full    Dental no notable dental hx.    Pulmonary neg pulmonary ROS   Pulmonary exam normal        Cardiovascular hypertension, Pt. on medications + CAD  + dysrhythmias Atrial Fibrillation  Rhythm:Regular Rate:Normal  ECHO:   1. Left ventricular ejection fraction, by estimation, is 55 to 60%. The left ventricle has normal function. The left ventricle has no regional wall motion abnormalities. Left ventricular diastolic parameters were normal.  2. Right ventricular systolic function is normal. The right ventricular size is normal. There is normal pulmonary artery systolic pressure.  3. The mitral valve is grossly normal. Trivial mitral valve regurgitation. No evidence of mitral stenosis.  4. The aortic valve is grossly normal. There is mild calcification of the aortic valve. There is mild thickening of the aortic valve. Aortic valve regurgitation is not visualized. Aortic valve sclerosis/calcification is present, without any evidence of aortic stenosis.  5. Aortic dilatation noted. There is mild dilatation of the ascending aorta, measuring 42 mm.  6. The inferior vena cava is normal in size with greater than 50% respiratory variability, suggesting right atrial pressure of 3 mmHg.   Comparison(s): No significant change from prior study.    Neuro/Psych  Headaches Carotid stenosis  TIA negative psych ROS   GI/Hepatic Neg liver ROS,GERD  ,,  Endo/Other  negative endocrine ROS    Renal/GU CRFRenal disease  negative genitourinary   Musculoskeletal  (+) Arthritis , Osteoarthritis,    Abdominal Normal abdominal exam  (+)   Peds  Hematology Lab Results      Component                Value                Date                      WBC                      8.6                 05/14/2023                HGB                      13.8                05/14/2023                HCT                      42.5                05/14/2023                MCV                      86.6                05/14/2023                PLT  225                 05/14/2023             Lab Results      Component                Value               Date                      NA                       136                 05/14/2023                K                        3.7                 05/14/2023                CO2                      21 (L)              05/14/2023                GLUCOSE                  91                  05/14/2023                BUN                      26 (H)              05/14/2023                CREATININE               1.21                05/14/2023                CALCIUM                  8.6 (L)             05/14/2023                GFRNONAA                 >60                 05/14/2023              Anesthesia Other Findings   Reproductive/Obstetrics                             Anesthesia Physical Anesthesia Plan  ASA: 3  Anesthesia Plan: General   Post-op Pain Management:    Induction: Intravenous  PONV Risk Score and Plan: 2 and Ondansetron, Dexamethasone and Treatment may vary due to age or medical condition  Airway Management Planned: Mask and Oral ETT  Additional Equipment: Arterial line  Intra-op Plan:   Post-operative Plan: Extubation  in OR  Informed Consent: I have reviewed the patients History and Physical, chart, labs and discussed the procedure including the risks, benefits and alternatives for the proposed anesthesia with the patient or authorized representative who has indicated his/her understanding and acceptance.     Dental advisory given  Plan Discussed with: CRNA  Anesthesia Plan Comments: (PAT note  by Antionette Poles, PA-C: Follows with cardiology for history of paroxysmal A-fib on Eliquis, HTN, mildly dilated ascending aorta, coronary artery calcifications, HTN, HLD.  Myoview 08/2019 showed normal perfusion, low risk.  Echo 04/2023 showed EF 55 to 60%, mildly dilated ascending aorta 42 mm, no significant valvular abnormalities.  Recent admission 7/23 to 05/06/2023 for transient strokelike symptoms.  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  Vascular surgery was consulted and plan for outpatient TCAR.  He was initiated on aspirin and Plavix in addition to his Eliquis.  Patient reported last dose Eliquis 05/11/2023.  History of right breast cancer s/p right mastectomy 03/2020.  History of CKD 3.  BMP and CBC from 05/06/2023 reviewed, unremarkable.  Patient will need day of surgery evaluation.  EKG 05/05/2023: Sinus rhythm with PACs in a pattern of bigeminy, cannot rule out anterior infarct, age-indeterminate.  Rate 76.  CTA head neck 05/05/2023: IMPRESSION: 1. No acute intracranial abnormality. Unchanged moderate chronic small-vessel disease. 2. No large vessel occlusion. 3. 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 4. 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 5. 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 Recommend ENT referral.  TTE 05/06/23: 1. Left ventricular ejection fraction, by estimation, is 55 to 60%. The  left ventricle has normal function. The left ventricle has no regional  wall motion abnormalities. Left ventricular diastolic parameters were  normal.  2. Right ventricular systolic function is normal. The right ventricular  size is normal. There is normal pulmonary artery systolic pressure.  3. The mitral valve is grossly normal. Trivial mitral valve  regurgitation. No evidence of mitral stenosis.  4. The aortic valve is grossly normal. There is mild calcification of  the  aortic valve. There is mild thickening of the aortic valve. Aortic valve  regurgitation is not visualized. Aortic valve sclerosis/calcification is  present, without any evidence of  aortic stenosis.  5. Aortic dilatation noted. There is mild dilatation of the ascending  aorta, measuring 42 mm.  6. The inferior vena cava is normal in size with greater than 50%  respiratory variability, suggesting right atrial pressure of 3 mmHg.   Comparison(s): No significant change from prior study.    )        Anesthesia Quick Evaluation

## 2023-05-13 NOTE — Progress Notes (Signed)
Anesthesia Chart Review:  Follows with cardiology for history of paroxysmal A-fib on Eliquis, HTN, mildly dilated ascending aorta, coronary artery calcifications, HTN, HLD.  Myoview 08/2019 showed normal perfusion, low risk.  Echo 04/2023 showed EF 55 to 60%, mildly dilated ascending aorta 42 mm, no significant valvular abnormalities.  Recent admission 7/23 to 05/06/2023 for transient strokelike symptoms.  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  Vascular surgery was consulted and plan for outpatient TCAR.  He was initiated on aspirin and Plavix in addition to his Eliquis.  Patient reported last dose Eliquis 05/11/2023.  History of right breast cancer s/p right mastectomy 03/2020.  History of CKD 3.  BMP and CBC from 05/06/2023 reviewed, unremarkable.  Patient will need day of surgery evaluation.  EKG 05/05/2023: Sinus rhythm with PACs in a pattern of bigeminy, cannot rule out anterior infarct, age-indeterminate.  Rate 76.  CTA head neck 05/05/2023: IMPRESSION: 1. No acute intracranial abnormality. Unchanged moderate chronic small-vessel disease. 2. No large vessel occlusion. 3. 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 4. 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 5. 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 Recommend ENT referral.  TTE 05/06/23:  1. Left ventricular ejection fraction, by estimation, is 55 to 60%. The  left ventricle has normal function. The left ventricle has no regional  wall motion abnormalities. Left ventricular diastolic parameters were  normal.   2. Right ventricular systolic function is normal. The right ventricular  size is normal. There is normal pulmonary artery systolic pressure.   3. The mitral valve is grossly normal. Trivial mitral valve  regurgitation. No evidence of mitral stenosis.   4. The aortic valve is grossly normal. There is mild calcification  of the  aortic valve. There is mild thickening of the aortic valve. Aortic valve  regurgitation is not visualized. Aortic valve sclerosis/calcification is  present, without any evidence of  aortic stenosis.   5. Aortic dilatation noted. There is mild dilatation of the ascending  aorta, measuring 42 mm.   6. The inferior vena cava is normal in size with greater than 50%  respiratory variability, suggesting right atrial pressure of 3 mmHg.   Comparison(s): No significant change from prior study.     Zannie Cove [facility]/Anesthesiology Phone [phone] 05/13/2023 12:58 PM

## 2023-05-14 ENCOUNTER — Inpatient Hospital Stay (HOSPITAL_COMMUNITY): Payer: PPO

## 2023-05-14 ENCOUNTER — Inpatient Hospital Stay (HOSPITAL_COMMUNITY): Payer: PPO | Admitting: Physician Assistant

## 2023-05-14 ENCOUNTER — Inpatient Hospital Stay (HOSPITAL_COMMUNITY)
Admission: RE | Admit: 2023-05-14 | Discharge: 2023-05-16 | DRG: 036 | Disposition: A | Discharge status: Home / Self Care | Payer: PPO | Attending: Vascular Surgery | Admitting: Vascular Surgery

## 2023-05-14 ENCOUNTER — Other Ambulatory Visit: Payer: Self-pay

## 2023-05-14 ENCOUNTER — Encounter (HOSPITAL_COMMUNITY): Payer: Self-pay | Admitting: Vascular Surgery

## 2023-05-14 ENCOUNTER — Encounter (HOSPITAL_COMMUNITY)
Admission: RE | Disposition: A | Discharge status: Home / Self Care | Payer: Self-pay | Source: Home / Self Care | Attending: Vascular Surgery

## 2023-05-14 DIAGNOSIS — N183 Chronic kidney disease, stage 3 unspecified: Secondary | ICD-10-CM

## 2023-05-14 DIAGNOSIS — D37030 Neoplasm of uncertain behavior of the parotid salivary glands: Secondary | ICD-10-CM | POA: Diagnosis present

## 2023-05-14 DIAGNOSIS — I6529 Occlusion and stenosis of unspecified carotid artery: Principal | ICD-10-CM | POA: Diagnosis present

## 2023-05-14 DIAGNOSIS — R7303 Prediabetes: Secondary | ICD-10-CM | POA: Diagnosis present

## 2023-05-14 DIAGNOSIS — M19012 Primary osteoarthritis, left shoulder: Secondary | ICD-10-CM | POA: Diagnosis present

## 2023-05-14 DIAGNOSIS — R001 Bradycardia, unspecified: Secondary | ICD-10-CM | POA: Diagnosis not present

## 2023-05-14 DIAGNOSIS — I480 Paroxysmal atrial fibrillation: Secondary | ICD-10-CM | POA: Diagnosis present

## 2023-05-14 DIAGNOSIS — N1831 Chronic kidney disease, stage 3a: Secondary | ICD-10-CM | POA: Diagnosis present

## 2023-05-14 DIAGNOSIS — I129 Hypertensive chronic kidney disease with stage 1 through stage 4 chronic kidney disease, or unspecified chronic kidney disease: Secondary | ICD-10-CM

## 2023-05-14 DIAGNOSIS — K5790 Diverticulosis of intestine, part unspecified, without perforation or abscess without bleeding: Secondary | ICD-10-CM | POA: Diagnosis present

## 2023-05-14 DIAGNOSIS — Z7982 Long term (current) use of aspirin: Secondary | ICD-10-CM | POA: Diagnosis not present

## 2023-05-14 DIAGNOSIS — I959 Hypotension, unspecified: Secondary | ICD-10-CM | POA: Diagnosis not present

## 2023-05-14 DIAGNOSIS — I6521 Occlusion and stenosis of right carotid artery: Principal | ICD-10-CM | POA: Diagnosis present

## 2023-05-14 DIAGNOSIS — Z7901 Long term (current) use of anticoagulants: Secondary | ICD-10-CM | POA: Diagnosis not present

## 2023-05-14 DIAGNOSIS — E785 Hyperlipidemia, unspecified: Secondary | ICD-10-CM | POA: Diagnosis present

## 2023-05-14 DIAGNOSIS — Z96643 Presence of artificial hip joint, bilateral: Secondary | ICD-10-CM | POA: Diagnosis present

## 2023-05-14 DIAGNOSIS — I77810 Thoracic aortic ectasia: Secondary | ICD-10-CM | POA: Diagnosis present

## 2023-05-14 DIAGNOSIS — Z96652 Presence of left artificial knee joint: Secondary | ICD-10-CM | POA: Diagnosis present

## 2023-05-14 DIAGNOSIS — M109 Gout, unspecified: Secondary | ICD-10-CM | POA: Diagnosis present

## 2023-05-14 DIAGNOSIS — Z853 Personal history of malignant neoplasm of breast: Secondary | ICD-10-CM | POA: Diagnosis not present

## 2023-05-14 DIAGNOSIS — I2510 Atherosclerotic heart disease of native coronary artery without angina pectoris: Secondary | ICD-10-CM | POA: Diagnosis present

## 2023-05-14 DIAGNOSIS — Z9011 Acquired absence of right breast and nipple: Secondary | ICD-10-CM

## 2023-05-14 DIAGNOSIS — Z79899 Other long term (current) drug therapy: Secondary | ICD-10-CM | POA: Diagnosis not present

## 2023-05-14 DIAGNOSIS — H9190 Unspecified hearing loss, unspecified ear: Secondary | ICD-10-CM | POA: Diagnosis present

## 2023-05-14 DIAGNOSIS — K219 Gastro-esophageal reflux disease without esophagitis: Secondary | ICD-10-CM | POA: Diagnosis present

## 2023-05-14 DIAGNOSIS — Z8673 Personal history of transient ischemic attack (TIA), and cerebral infarction without residual deficits: Secondary | ICD-10-CM | POA: Diagnosis not present

## 2023-05-14 DIAGNOSIS — Z8249 Family history of ischemic heart disease and other diseases of the circulatory system: Secondary | ICD-10-CM

## 2023-05-14 DIAGNOSIS — Z823 Family history of stroke: Secondary | ICD-10-CM

## 2023-05-14 HISTORY — PX: TRANSCAROTID ARTERY REVASCULARIZATIONÂ: SHX6778

## 2023-05-14 HISTORY — PX: ULTRASOUND GUIDANCE FOR VASCULAR ACCESS: SHX6516

## 2023-05-14 HISTORY — DX: Cardiac arrhythmia, unspecified: I49.9

## 2023-05-14 LAB — APTT: aPTT: 26 seconds (ref 24–36)

## 2023-05-14 LAB — TYPE AND SCREEN
ABO/RH(D): B POS
Antibody Screen: NEGATIVE

## 2023-05-14 LAB — COMPREHENSIVE METABOLIC PANEL
ALT: 28 U/L (ref 0–44)
AST: 30 U/L (ref 15–41)
Albumin: 3.3 g/dL — ABNORMAL LOW (ref 3.5–5.0)
Alkaline Phosphatase: 70 U/L (ref 38–126)
Anion gap: 12 (ref 5–15)
BUN: 26 mg/dL — ABNORMAL HIGH (ref 8–23)
CO2: 21 mmol/L — ABNORMAL LOW (ref 22–32)
Calcium: 8.6 mg/dL — ABNORMAL LOW (ref 8.9–10.3)
Chloride: 103 mmol/L (ref 98–111)
Creatinine, Ser: 1.21 mg/dL (ref 0.61–1.24)
GFR, Estimated: >60 mL/min (ref >60)
Glucose, Bld: 91 mg/dL (ref 70–99)
Potassium: 3.7 mmol/L (ref 3.5–5.1)
Sodium: 136 mmol/L (ref 135–145)
Total Bilirubin: 0.8 mg/dL (ref 0.3–1.2)
Total Protein: 6.1 g/dL — ABNORMAL LOW (ref 6.5–8.1)

## 2023-05-14 LAB — URINALYSIS, ROUTINE W REFLEX MICROSCOPIC
Bacteria, UA: NONE SEEN
Bilirubin Urine: NEGATIVE
Glucose, UA: NEGATIVE mg/dL
Hgb urine dipstick: NEGATIVE
Ketones, ur: NEGATIVE mg/dL
Leukocytes,Ua: NEGATIVE
Nitrite: NEGATIVE
Protein, ur: 30 mg/dL — AB
Specific Gravity, Urine: 1.014 (ref 1.005–1.030)
pH: 7 (ref 5.0–8.0)

## 2023-05-14 LAB — CBC
HCT: 42.5 % (ref 39.0–52.0)
Hemoglobin: 13.8 g/dL (ref 13.0–17.0)
MCH: 28.1 pg (ref 26.0–34.0)
MCHC: 32.5 g/dL (ref 30.0–36.0)
MCV: 86.6 fL (ref 80.0–100.0)
Platelets: 225 10*3/uL (ref 150–400)
RBC: 4.91 MIL/uL (ref 4.22–5.81)
RDW: 13.6 % (ref 11.5–15.5)
WBC: 8.6 10*3/uL (ref 4.0–10.5)
nRBC: 0 % (ref 0.0–0.2)

## 2023-05-14 LAB — SURGICAL PCR SCREEN
MRSA, PCR: NEGATIVE
Staphylococcus aureus: NEGATIVE

## 2023-05-14 LAB — PROTIME-INR
INR: 1.1 (ref 0.8–1.2)
Prothrombin Time: 14.6 seconds (ref 11.4–15.2)

## 2023-05-14 LAB — POCT ACTIVATED CLOTTING TIME: Activated Clotting Time: 275 seconds

## 2023-05-14 SURGERY — TRANSCAROTID ARTERY REVASCULARIZATION (TCAR)
Anesthesia: General | Site: Neck | Laterality: Right

## 2023-05-14 MED ORDER — HYDRALAZINE HCL 20 MG/ML IJ SOLN: 5.0000 mg | INTRAMUSCULAR | Status: DC | PRN

## 2023-05-14 MED ORDER — MIDAZOLAM HCL 2 MG/2ML IJ SOLN
INTRAMUSCULAR | Status: AC
  Filled 2023-05-14: qty 2

## 2023-05-14 MED ORDER — ONDANSETRON HCL 4 MG/2ML IJ SOLN: 4.0000 mg | Freq: Four times a day (QID) | INTRAMUSCULAR | Status: DC | PRN

## 2023-05-14 MED ORDER — ONDANSETRON HCL 4 MG/2ML IJ SOLN
INTRAMUSCULAR | Status: AC
  Filled 2023-05-14: qty 2

## 2023-05-14 MED ORDER — PHENYLEPHRINE HCL (PRESSORS) 10 MG/ML IV SOLN
INTRAVENOUS | Status: DC | PRN
Start: 2023-05-14 — End: 2023-05-14

## 2023-05-14 MED ORDER — PHENOL 1.4 % MT LIQD: 1.0000 | OROMUCOSAL | Status: DC | PRN

## 2023-05-14 MED ORDER — LIDOCAINE 2% (20 MG/ML) 5 ML SYRINGE
INTRAMUSCULAR | Status: DC | PRN
  Administered 2023-05-14: 80 mg via INTRAVENOUS

## 2023-05-14 MED ORDER — CHLORHEXIDINE GLUCONATE CLOTH 2 % EX PADS
6.0000 | MEDICATED_PAD | Freq: Once | CUTANEOUS | Status: DC
  Administered 2023-05-14: 6 via TOPICAL

## 2023-05-14 MED ORDER — SODIUM CHLORIDE 0.9 % IV SOLN
500.0000 mL | Freq: Once | INTRAVENOUS | Status: AC | PRN
  Administered 2023-05-15: 500 mL via INTRAVENOUS

## 2023-05-14 MED ORDER — IODIXANOL 320 MG/ML IV SOLN
INTRAVENOUS | Status: DC | PRN
  Administered 2023-05-14: 12 mL via INTRA_ARTERIAL

## 2023-05-14 MED ORDER — ORAL CARE MOUTH RINSE: 15.0000 mL | Freq: Once | OROMUCOSAL | Status: AC

## 2023-05-14 MED ORDER — LACTATED RINGERS IV SOLN: INTRAVENOUS | Status: DC

## 2023-05-14 MED ORDER — GUAIFENESIN-DM 100-10 MG/5ML PO SYRP: 15.0000 mL | ORAL_SOLUTION | ORAL | Status: DC | PRN

## 2023-05-14 MED ORDER — HYDROMORPHONE HCL 1 MG/ML IJ SOLN: 0.5000 mg | INTRAMUSCULAR | Status: DC | PRN

## 2023-05-14 MED ORDER — DOCUSATE SODIUM 100 MG PO CAPS
100.0000 mg | ORAL_CAPSULE | Freq: Every day | ORAL | Status: DC
  Administered 2023-05-16: 100 mg via ORAL
  Filled 2023-05-14 (×2): qty 1

## 2023-05-14 MED ORDER — CEFAZOLIN SODIUM-DEXTROSE 2-4 GM/100ML-% IV SOLN
2.0000 g | Freq: Three times a day (TID) | INTRAVENOUS | Status: AC
  Administered 2023-05-14 – 2023-05-15 (×2): 2 g via INTRAVENOUS
  Filled 2023-05-14 (×2): qty 100

## 2023-05-14 MED ORDER — LIDOCAINE HCL (PF) 1 % IJ SOLN
INTRAMUSCULAR | Status: AC
  Filled 2023-05-14: qty 30

## 2023-05-14 MED ORDER — ONDANSETRON HCL 4 MG/2ML IJ SOLN
INTRAMUSCULAR | Status: DC | PRN
  Administered 2023-05-14: 4 mg via INTRAVENOUS

## 2023-05-14 MED ORDER — CEFAZOLIN SODIUM-DEXTROSE 2-4 GM/100ML-% IV SOLN
INTRAVENOUS | Status: AC
  Filled 2023-05-14: qty 100

## 2023-05-14 MED ORDER — SENNOSIDES-DOCUSATE SODIUM 8.6-50 MG PO TABS: 1.0000 | ORAL_TABLET | Freq: Every evening | ORAL | Status: DC | PRN

## 2023-05-14 MED ORDER — HEPARIN SODIUM (PORCINE) 5000 UNIT/ML IJ SOLN
5000.0000 [IU] | Freq: Three times a day (TID) | INTRAMUSCULAR | Status: DC
  Administered 2023-05-15 – 2023-05-16 (×3): 5000 [IU] via SUBCUTANEOUS
  Filled 2023-05-14 (×4): qty 1

## 2023-05-14 MED ORDER — POTASSIUM CHLORIDE CRYS ER 20 MEQ PO TBCR: 20.0000 meq | EXTENDED_RELEASE_TABLET | Freq: Every day | ORAL | Status: DC | PRN

## 2023-05-14 MED ORDER — CHLORHEXIDINE GLUCONATE 0.12 % MT SOLN
OROMUCOSAL | Status: AC
  Administered 2023-05-14: 15 mL via OROMUCOSAL
  Filled 2023-05-14: qty 15

## 2023-05-14 MED ORDER — PROTAMINE SULFATE 10 MG/ML IV SOLN
INTRAVENOUS | Status: DC | PRN
  Administered 2023-05-14: 50 mg via INTRAVENOUS

## 2023-05-14 MED ORDER — CEFAZOLIN SODIUM-DEXTROSE 2-4 GM/100ML-% IV SOLN
2.0000 g | INTRAVENOUS | Status: AC
  Administered 2023-05-14: 2 g via INTRAVENOUS

## 2023-05-14 MED ORDER — ATORVASTATIN CALCIUM 80 MG PO TABS
80.0000 mg | ORAL_TABLET | Freq: Every day | ORAL | Status: DC
  Administered 2023-05-14 – 2023-05-16 (×3): 80 mg via ORAL
  Filled 2023-05-14 (×3): qty 1

## 2023-05-14 MED ORDER — FENTANYL CITRATE (PF) 250 MCG/5ML IJ SOLN
INTRAMUSCULAR | Status: AC
  Filled 2023-05-14: qty 5

## 2023-05-14 MED ORDER — HEPARIN 6000 UNIT IRRIGATION SOLUTION
Status: AC
  Filled 2023-05-14: qty 500

## 2023-05-14 MED ORDER — SUGAMMADEX SODIUM 200 MG/2ML IV SOLN
INTRAVENOUS | Status: DC | PRN
  Administered 2023-05-14: 200 mg via INTRAVENOUS

## 2023-05-14 MED ORDER — METOPROLOL TARTRATE 5 MG/5ML IV SOLN: 2.0000 mg | INTRAVENOUS | Status: DC | PRN

## 2023-05-14 MED ORDER — PROPOFOL 10 MG/ML IV BOLUS
INTRAVENOUS | Status: DC | PRN
  Administered 2023-05-14: 140 mg via INTRAVENOUS

## 2023-05-14 MED ORDER — FENTANYL CITRATE (PF) 250 MCG/5ML IJ SOLN
INTRAMUSCULAR | Status: DC | PRN
  Administered 2023-05-14 (×2): 100 ug via INTRAVENOUS

## 2023-05-14 MED ORDER — ACETAMINOPHEN 650 MG RE SUPP: 325.0000 mg | RECTAL | Status: DC | PRN

## 2023-05-14 MED ORDER — LABETALOL HCL 5 MG/ML IV SOLN: 10.0000 mg | INTRAVENOUS | Status: DC | PRN

## 2023-05-14 MED ORDER — SODIUM CHLORIDE 0.9 % IV SOLN: INTRAVENOUS | Status: DC | PRN

## 2023-05-14 MED ORDER — GLYCOPYRROLATE 0.2 MG/ML IJ SOLN
INTRAMUSCULAR | Status: DC | PRN
  Administered 2023-05-14 (×2): .1 mg via INTRAVENOUS
  Administered 2023-05-14: .2 mg via INTRAVENOUS

## 2023-05-14 MED ORDER — PHENYLEPHRINE 80 MCG/ML (10ML) SYRINGE FOR IV PUSH (FOR BLOOD PRESSURE SUPPORT)
PREFILLED_SYRINGE | INTRAVENOUS | Status: DC | PRN
  Administered 2023-05-14: 160 ug via INTRAVENOUS
  Administered 2023-05-14: 80 ug via INTRAVENOUS

## 2023-05-14 MED ORDER — ASPIRIN 81 MG PO TBEC
81.0000 mg | DELAYED_RELEASE_TABLET | Freq: Every day | ORAL | Status: DC
  Administered 2023-05-15 – 2023-05-16 (×2): 81 mg via ORAL
  Filled 2023-05-14 (×2): qty 1

## 2023-05-14 MED ORDER — PHENYLEPHRINE HCL-NACL 20-0.9 MG/250ML-% IV SOLN
INTRAVENOUS | Status: DC | PRN
  Administered 2023-05-14: 50 ug/min via INTRAVENOUS

## 2023-05-14 MED ORDER — TAMOXIFEN CITRATE 10 MG PO TABS
20.0000 mg | ORAL_TABLET | Freq: Every day | ORAL | Status: DC
  Administered 2023-05-15 – 2023-05-16 (×2): 20 mg via ORAL
  Filled 2023-05-14 (×2): qty 2

## 2023-05-14 MED ORDER — SODIUM CHLORIDE 0.9 % IV SOLN: INTRAVENOUS | Status: DC

## 2023-05-14 MED ORDER — PROPOFOL 10 MG/ML IV BOLUS
INTRAVENOUS | Status: AC
  Filled 2023-05-14: qty 20

## 2023-05-14 MED ORDER — CHLORHEXIDINE GLUCONATE 0.12 % MT SOLN: 15.0000 mL | Freq: Once | OROMUCOSAL | Status: AC

## 2023-05-14 MED ORDER — ROCURONIUM BROMIDE 10 MG/ML (PF) SYRINGE
PREFILLED_SYRINGE | INTRAVENOUS | Status: DC | PRN
  Administered 2023-05-14: 60 mg via INTRAVENOUS

## 2023-05-14 MED ORDER — DILTIAZEM HCL ER COATED BEADS 120 MG PO CP24
120.0000 mg | ORAL_CAPSULE | Freq: Every day | ORAL | Status: DC
  Administered 2023-05-15 – 2023-05-16 (×2): 120 mg via ORAL
  Filled 2023-05-14 (×2): qty 1

## 2023-05-14 MED ORDER — HEPARIN 6000 UNIT IRRIGATION SOLUTION
Status: DC | PRN
  Administered 2023-05-14: 1

## 2023-05-14 MED ORDER — MAGNESIUM SULFATE 2 GM/50ML IV SOLN: 2.0000 g | Freq: Every day | INTRAVENOUS | Status: DC | PRN

## 2023-05-14 MED ORDER — MIDAZOLAM HCL 2 MG/2ML IJ SOLN
INTRAMUSCULAR | Status: DC | PRN
  Administered 2023-05-14: 1 mg via INTRAVENOUS

## 2023-05-14 MED ORDER — ALUM & MAG HYDROXIDE-SIMETH 200-200-20 MG/5ML PO SUSP: 15.0000 mL | ORAL | Status: DC | PRN

## 2023-05-14 MED ORDER — BISACODYL 5 MG PO TBEC: 5.0000 mg | DELAYED_RELEASE_TABLET | Freq: Every day | ORAL | Status: DC | PRN

## 2023-05-14 MED ORDER — EPHEDRINE SULFATE-NACL 50-0.9 MG/10ML-% IV SOSY
PREFILLED_SYRINGE | INTRAVENOUS | Status: DC | PRN
  Administered 2023-05-14: 10 mg via INTRAVENOUS

## 2023-05-14 MED ORDER — FENTANYL CITRATE (PF) 100 MCG/2ML IJ SOLN: 25.0000 ug | INTRAMUSCULAR | Status: DC | PRN

## 2023-05-14 MED ORDER — GLYCOPYRROLATE PF 0.2 MG/ML IJ SOSY
PREFILLED_SYRINGE | INTRAMUSCULAR | Status: AC
  Filled 2023-05-14: qty 1

## 2023-05-14 MED ORDER — HEPARIN SODIUM (PORCINE) 1000 UNIT/ML IJ SOLN
INTRAMUSCULAR | Status: DC | PRN
  Administered 2023-05-14: 8000 [IU] via INTRAVENOUS

## 2023-05-14 MED ORDER — ACETAMINOPHEN 325 MG PO TABS: 325.0000 mg | ORAL_TABLET | ORAL | Status: DC | PRN

## 2023-05-14 MED ORDER — PANTOPRAZOLE SODIUM 40 MG PO TBEC
40.0000 mg | DELAYED_RELEASE_TABLET | Freq: Every day | ORAL | Status: DC
  Administered 2023-05-14 – 2023-05-16 (×2): 40 mg via ORAL
  Filled 2023-05-14 (×3): qty 1

## 2023-05-14 MED ORDER — CLOPIDOGREL BISULFATE 75 MG PO TABS
75.0000 mg | ORAL_TABLET | Freq: Every day | ORAL | Status: DC
  Administered 2023-05-15 – 2023-05-16 (×2): 75 mg via ORAL
  Filled 2023-05-14 (×2): qty 1

## 2023-05-14 MED ORDER — ACETAMINOPHEN 10 MG/ML IV SOLN: 1000.0000 mg | Freq: Once | INTRAVENOUS | Status: DC | PRN

## 2023-05-14 MED ORDER — OXYCODONE HCL 5 MG PO TABS: 5.0000 mg | ORAL_TABLET | ORAL | Status: DC | PRN

## 2023-05-14 MED ORDER — LACTATED RINGERS IV SOLN: INTRAVENOUS | Status: DC | PRN

## 2023-05-14 MED ORDER — 0.9 % SODIUM CHLORIDE (POUR BTL) OPTIME
TOPICAL | Status: DC | PRN
  Administered 2023-05-14: 1000 mL

## 2023-05-14 SURGICAL SUPPLY — 52 items
ADH SKN CLS APL DERMABOND .7 (GAUZE/BANDAGES/DRESSINGS) ×4
ADH SKN CLS LQ APL DERMABOND (GAUZE/BANDAGES/DRESSINGS) ×2
APL PRP STRL LF DISP 70% ISPRP (MISCELLANEOUS) ×4
BAG BANDED W/RUBBER/TAPE 36X54 (MISCELLANEOUS) ×2 IMPLANT
BAG EQP BAND 135X91 W/RBR TAPE (MISCELLANEOUS) ×2
CANISTER SUCT 3000ML PPV (MISCELLANEOUS) ×2 IMPLANT
CATH BALLN ENROUTE 6X35 (CATHETERS) IMPLANT
CHLORAPREP W/TINT 26 (MISCELLANEOUS) ×4 IMPLANT
CLIP LIGATING EXTRA MED SLVR (CLIP) IMPLANT
CLIP LIGATING EXTRA SM BLUE (MISCELLANEOUS) IMPLANT
COVER DOME SNAP 22 D (MISCELLANEOUS) ×2 IMPLANT
COVER PROBE W GEL 5X96 (DRAPES) ×2 IMPLANT
DERMABOND ADVANCED .7 DNX12 (GAUZE/BANDAGES/DRESSINGS) IMPLANT
DERMABOND ADVANCED .7 DNX6 (GAUZE/BANDAGES/DRESSINGS) ×2 IMPLANT
DRAPE FEMORAL ANGIO 80X135IN (DRAPES) ×2 IMPLANT
ELECT REM PT RETURN 9FT ADLT (ELECTROSURGICAL) ×2
ELECTRODE REM PT RTRN 9FT ADLT (ELECTROSURGICAL) ×2 IMPLANT
GAUZE SPONGE 4X4 12PLY STRL (GAUZE/BANDAGES/DRESSINGS) ×2 IMPLANT
GLOVE BIO SURGEON STRL SZ8 (GLOVE) ×2 IMPLANT
GOWN STRL REUS W/ TWL LRG LVL3 (GOWN DISPOSABLE) ×4 IMPLANT
GOWN STRL REUS W/ TWL XL LVL3 (GOWN DISPOSABLE) ×2 IMPLANT
GOWN STRL REUS W/TWL LRG LVL3 (GOWN DISPOSABLE) ×4
GOWN STRL REUS W/TWL XL LVL3 (GOWN DISPOSABLE) ×2
GUIDEWIRE ENROUTE 0.014 (WIRE) ×2 IMPLANT
INTRODUCER KIT GALT 7CM (INTRODUCER) ×2
KIT BASIN OR (CUSTOM PROCEDURE TRAY) ×2 IMPLANT
KIT ENCORE 26 ADVANTAGE (KITS) ×2 IMPLANT
KIT INTRODUCER GALT 7 (INTRODUCER) ×2 IMPLANT
KIT TURNOVER KIT B (KITS) ×2 IMPLANT
NDL HYPO 25GX1X1/2 BEV (NEEDLE) IMPLANT
NEEDLE HYPO 25GX1X1/2 BEV (NEEDLE) IMPLANT
NS IRRIG 1000ML POUR BTL (IV SOLUTION) ×2 IMPLANT
PACK CAROTID (CUSTOM PROCEDURE TRAY) ×2 IMPLANT
POSITIONER HEAD DONUT 9IN (MISCELLANEOUS) ×2 IMPLANT
SET MICROPUNCTURE 5F STIFF (MISCELLANEOUS) ×2 IMPLANT
SHEATH AVANTI 11CM 5FR (SHEATH) IMPLANT
SHUNT CAROTID BYPASS 10 (VASCULAR PRODUCTS) IMPLANT
STENT TRANSCAROTID SYSTEM 9X40 (Permanent Stent) IMPLANT
SUT MNCRL AB 4-0 PS2 18 (SUTURE) ×2 IMPLANT
SUT PROLENE 5 0 C 1 24 (SUTURE) ×2 IMPLANT
SUT PROLENE 6 0 BV (SUTURE) IMPLANT
SUT SILK 2 0 SH (SUTURE) ×2 IMPLANT
SUT VIC AB 3-0 SH 27 (SUTURE) ×2
SUT VIC AB 3-0 SH 27X BRD (SUTURE) ×2 IMPLANT
SYR 10ML LL (SYRINGE) ×6 IMPLANT
SYR 20ML LL LF (SYRINGE) ×2 IMPLANT
SYR CONTROL 10ML LL (SYRINGE) IMPLANT
SYSTEM TRANSCAROTID NEUROPRTCT (MISCELLANEOUS) ×2 IMPLANT
TOWEL GREEN STERILE (TOWEL DISPOSABLE) ×2 IMPLANT
TRANSCAROTID NEUROPROTECT SYS (MISCELLANEOUS) ×2
WATER STERILE IRR 1000ML POUR (IV SOLUTION) ×2 IMPLANT
WIRE BENTSON .035X145CM (WIRE) ×2 IMPLANT

## 2023-05-14 NOTE — Transfer of Care (Signed)
Immediate Anesthesia Transfer of Care Note  Patient: Logan Wright  Procedure(s) Performed: Transcarotid Artery Revascularization (Right: Neck) ULTRASOUND GUIDANCE FOR VASCULAR ACCESS (Left: Groin)  Patient Location: PACU  Anesthesia Type:General  Level of Consciousness: awake, drowsy, and patient cooperative  Airway & Oxygen Therapy: Patient Spontanous Breathing  Post-op Assessment: Report given to RN and Post -op Vital signs reviewed and stable  Post vital signs: Reviewed and stable  Last Vitals:  Vitals Value Taken Time  BP    Temp    Pulse    Resp    SpO2      Last Pain:  Vitals:   05/14/23 0847  PainSc: 0-No pain         Complications: No notable events documented.

## 2023-05-14 NOTE — Op Note (Signed)
DATE OF SERVICE: 05/14/2023  PATIENT:  Logan Wright  77 y.o. male  PRE-OPERATIVE DIAGNOSIS:  symptomatic right carotid artery stenosis  POST-OPERATIVE DIAGNOSIS:  Same  PROCEDURE:   Right transcarotid artery revascularization (TCAR)  SURGEON:  Surgeons and Role:    * Leonie Douglas, MD - Primary  ASSISTANT: Nathanial Rancher, PA-C  An experienced assistant was required given the complexity of this procedure and the standard of surgical care. My assistant helped with exposure through counter tension, suctioning, ligation and retraction to better visualize the surgical field.  My assistant expedited sewing during the case by following my sutures. Wherever I use the term "we" in the report, my assistant actively helped me with that portion of the procedure.  ANESTHESIA:   general  EBL: minimal  BLOOD ADMINISTERED:none  DRAINS: none   LOCAL MEDICATIONS USED:  NONE  SPECIMEN:  none  COUNTS: confirmed correct.  TOURNIQUET:  none  PATIENT DISPOSITION:  PACU - hemodynamically stable.   Delay start of Pharmacological VTE agent (>24hrs) due to surgical blood loss or risk of bleeding: no  INDICATION FOR PROCEDURE: Logan Wright is a 77 y.o. male with symptomatic right carotid artery stenosis. After careful discussion of risks, benefits, and alternatives the patient was offered TCAR. We specifically discussed risk of stroke, risk of cranial nerve injury, and risk of hematoma. The patient understood and wished to proceed.  OPERATIVE FINDINGS: successful TCAR. Patient awoke neurologically intact without cranial nerve abnormalities. He could move all extremities to command.   DESCRIPTION OF PROCEDURE: After identification of the patient in the pre-operative holding area, the patient was transferred to the operating room. The patient was positioned supine on the operating room table. Anesthesia was induced. The right neck and groins were prepped and draped in standard fashion. A surgical  pause was performed confirming correct patient, procedure, and operative location.  Using intraoperative ultrasound the course of the right common carotid artery was mapped on the skin.  A transverse incision was made between the sternal and clavicular heads of the sternocleidomastoid muscle, below the omohyoid. Following longitudinal division of the carotid sheath the jugular vein was partially skeletonized and retracted medially. Once 3 cm of common carotid artery (CCA) were isolated, umbilical tape was placed around the proximal 1/3 of the CCA under direct vision. A 5-O Prolene suture was pre-placed in the anterior wall of the CCA, in a "U stitch" configuration, close to the clavicle to facilitate hemostasis upon removal of the arterial sheath at completion of the TCAR procedure.   The contralateral common femoral vein (CFV) was accessed under ultrasound guidance, using standard Seldinger and micropuncture access technique. The venous return sheath was advanced into the CFV over the 0.035" wire provided. Blood was aspirated from the flow line followed by flushing of the Venous Sheath with heparinized saline. The Venous Sheath was secured to the patient's skin with suture to maintain  optimal position in the vessel. Heparin was given to obtain a therapeutic activated clotting time >250 seconds prior to arterial access.   A 4-French non-stiffened ENHANCE Transcarotid / Peripheral Access set was used, puncturing the artery with the 21G needle through the pre-placed "U" stitch while holding gentle traction on the umbilical tape to stabilize and centralize the CCA within the incision. Careful attention was paid to the change in CCA shape when using the umbilical tape to control or lift the artery. The micropuncture wire was then advanced 3-4 cm into the CCA and, the 21G needle was removed. The  micropuncture sheath was advanced 2-3 cm into the CCA and the wire and dilator were removed. Pulsatile backflow  indicated correct positioning. The provided 0.035" J-tipped guidewire was inserted as close as possible to the bifurcation without engaging the lesion. After micropuncture sheath removal, the Transcarotid Arterial Sheath was advanced to the 2.5cm marker and the 0.035" wire and dilator were then removed. Arterial Sheath position was assessed under fluoroscopy in two projections to ensure that the sheath tip was oriented coaxially in the CCA. The Arterial Sheath was sutured to the patient with gentle forward tension. Blood was slowly aspirated followed by flushing with heparinized saline. No ingress of air bubbles through the passive hemostatic valve was observed. The stopcocks were closed. Traction applied to the CCA previously to facilitate access was gently released.  The Flow Controller was connected to the Transcarotid Arterial Sheath, prepared by passively allowing a column of arterial blood to fill the line and connected to the Venous Return Sheath. CCA inflow was occluded proximal to the arteriotomy with a vascular clamp to achieve active flow reversal. To confirm flow reversal, a saline bolus was delivered into the venous flow line on both "High" and "Low" flow settings of the Flow Controller. Angiograms were performed with slow injections of a small amount of contrast filling just past the lesion to minimize antegrade transmission of micro-bubbles.  Prior to lesion manipulation, heart rate (70bpm) and systolic BP (140-155mmHg) were managed upwards to optimize flow reversal and procedural neuroprotection. The lesion was crossed with an 0.014" ENROUTE guidewire and pre-dilation of the lesion was performed with a 6x35 mm rapid exchange 0.014" compatible balloon catheter to 8 atmospheres for 10 seconds. Stenting was performed with an 9x31mm ENROUTE Transcarotid stent, sized appropriately to the right CCA.  A total of 7 minutes of flow reversal was used.  AP and lateral angiograms (gentle contrast  injections) were performed to confirm stent placement and arterial wall stent apposition. At [facility] case completion, antegrade flow was restored by releasing the clamp on the CCA then closing the NPS stopcocks to the flow lines. The Transcarotid Arterial Sheath was removed and the pre-closure suture was tied. Heparin reversal was employed and a drain was placed. The Venous Return Sheath was removed and hemostasis was achieved with brief manual compression.   Upon completion of the case instrument and sharps counts were confirmed correct. The patient was transferred to the PACU in good condition. I was present for all portions of the procedure. The patient had a normal neurologic exam in the operating room and confirmed in the PACU.  FOLLOW UP PLAN: Assuming a normal postoperative course, I will see the patient in 4 weeks with carotid duplex.   Rande Brunt. Lenell Antu, MD [facility] Phone Number: [phone] 05/14/2023 12:47 PM

## 2023-05-14 NOTE — Progress Notes (Signed)
1035: Pt was bale to squeeze both hands equally, move lower extremities and stick out tongue midline.

## 2023-05-14 NOTE — Progress Notes (Signed)
  Progress Note    05/14/2023 2:25 PM Day of Surgery  Subjective:  dry mouth but otherwise feeling good   Vitals:   05/14/23 1330 05/14/23 1359  BP: (!) 89/48 (!) 88/44  Pulse: (!) 47 (!) 47  Resp: 13 14  Temp:  (!) 96.3 F (35.7 C)  SpO2: 96% 98%   Physical Exam: Cardiac:  regular Lungs:  non labored Incisions:  right supraclavicular incision is well appearing without swelling or hematoma Extremities:  moving all extremities without deficits Neurologic: alert and oriented. Smile symmetric. Tongue midline  CBC    Component Value Date/Time   WBC 8.6 05/14/2023 0923   RBC 4.91 05/14/2023 0923   HGB 13.8 05/14/2023 0923   HGB 13.5 07/10/2022 1027   HGB 14.4 08/09/2020 1153   HCT 42.5 05/14/2023 0923   HCT 44.5 08/09/2020 1153   PLT 225 05/14/2023 0923   PLT 218 07/10/2022 1027   PLT 359 08/09/2020 1153   MCV 86.6 05/14/2023 0923   MCV 86 08/09/2020 1153   MCH 28.1 05/14/2023 0923   MCHC 32.5 05/14/2023 0923   RDW 13.6 05/14/2023 0923   RDW 14.9 08/09/2020 1153   LYMPHSABS 2.0 05/05/2023 1045   MONOABS 0.8 05/05/2023 1045   EOSABS 0.1 05/05/2023 1045   BASOSABS 0.0 05/05/2023 1045    BMET    Component Value Date/Time   NA 136 05/14/2023 0923   NA 142 08/09/2020 1153   K 3.7 05/14/2023 0923   CL 103 05/14/2023 0923   CO2 21 (L) 05/14/2023 0923   GLUCOSE 91 05/14/2023 0923   BUN 26 (H) 05/14/2023 0923   BUN 19 08/09/2020 1153   CREATININE 1.21 05/14/2023 0923   CREATININE 1.14 07/10/2022 1027   CREATININE 1.47 (H) 08/24/2015 1257   CALCIUM 8.6 (L) 05/14/2023 0923   GFRNONAA >60 05/14/2023 0923   GFRNONAA >60 07/10/2022 1027   GFRAA 54 (L) 08/09/2020 1153    INR    Component Value Date/Time   INR 1.1 05/14/2023 0923     Intake/Output Summary (Last 24 hours) at 05/14/2023 1425 Last data filed at 05/14/2023 1226 Gross per 24 hour  Intake 1900 ml  Output --  Net 1900 ml     Assessment/Plan:  77 y.o. male is s/p right TCAR  Day of Surgery    Right supraclavicular incision is intact without swelling or hematoma Neurologically intact BP is soft. Asymptomatic. Aline better than cuff. Getting fluids. Will continue to monitor Anticipate d/c tomorrow if he continues to progress well  Graceann Congress, PA-C [facility] [phone] 05/14/2023 2:25 PM

## 2023-05-14 NOTE — Progress Notes (Signed)
Patient brought to 4E from PACU. Telemetry box applied, CCMD notified. Patient oriented to room and staff. Call bell in reach.    05/14/23 1359  Vitals  Temp (!) 96.3 F (35.7 C)  Temp Source Axillary  BP (!) 88/44  MAP (mmHg) (!) 58  BP Location Right Arm  BP Method Automatic  Patient Position (if appropriate) Lying  Pulse Rate (!) 47  Pulse Rate Source Monitor  ECG Heart Rate (!) 48  MEWS COLOR  MEWS Score Color Red  Oxygen Therapy  SpO2 98 %  O2 Device Room Air  MEWS Score  MEWS Temp 1  MEWS Systolic 1  MEWS Pulse 1  MEWS RR 1  MEWS LOC 0  MEWS Score 4

## 2023-05-14 NOTE — Anesthesia Procedure Notes (Signed)
Procedure Name: Intubation Date/Time: 05/14/2023 11:03 AM  Performed by: Gus Puma, CRNAPre-anesthesia Checklist: Patient identified, Emergency Drugs available, Suction available and Patient being monitored Patient Re-evaluated:Patient Re-evaluated prior to induction Oxygen Delivery Method: Circle System Utilized Preoxygenation: Pre-oxygenation with 100% oxygen Induction Type: IV induction Ventilation: Mask ventilation without difficulty Laryngoscope Size: Mac and 4 Grade View: Grade II Tube type: Oral Tube size: 7.5 mm Number of attempts: 1 Airway Equipment and Method: Stylet Placement Confirmation: ETT inserted through vocal cords under direct vision, positive ETCO2 and breath sounds checked- equal and bilateral Secured at: 23 cm Tube secured with: Tape Dental Injury: Teeth and Oropharynx as per pre-operative assessment

## 2023-05-14 NOTE — Discharge Instructions (Signed)
   [facility] [place]  Discharge Instructions   Carotid Surgery  Please refer to the following instructions for your post-procedure care. Your surgeon or physician assistant will discuss any changes with you.  Activity  You are encouraged to walk as much as you can. You can slowly return to normal activities but must avoid strenuous activity and heavy lifting until your doctor tell you it's okay. Avoid activities such as vacuuming or swinging a golf club. You can drive after one week if you are comfortable and you are no longer taking prescription pain medications. It is normal to feel tired for serval weeks after your surgery. It is also normal to have difficulty with sleep habits, eating, and bowel movements after surgery. These will go away with time.  Bathing/Showering  Shower daily after you go home. Do not soak in a bathtub, hot tub, or swim until the incision heals completely.  Incision Care  Shower every day. Clean your incision with mild soap and water. Pat the area dry with a clean towel. You do not need a bandage unless otherwise instructed. Do not apply any ointments or creams to your incision. You may have skin glue on your incision. Do not peel it off. It will come off on its own in about one week. Your incision may feel thickened and raised for several weeks after your surgery. This is normal and the skin will soften over time.   For Men Only: It's okay to shave around the incision but do not shave the incision itself for 2 weeks. It is common to have numbness under your chin that could last for several months.  Diet  Resume your normal diet. There are no special food restrictions following this procedure. A low fat/low cholesterol diet is recommended for all patients with vascular disease. In order to heal from your surgery, it is CRITICAL to get adequate nutrition. Your body requires vitamins, minerals, and protein. Vegetables are the best source of  vitamins and minerals. Vegetables also provide the perfect balance of protein. Processed food has little nutritional value, so try to avoid this.  Medications  Resume taking all of your medications unless your doctor or physician assistant tells you not to. If your incision is causing pain, you may take over-the- counter pain relievers such as acetaminophen (Tylenol). If you were prescribed a stronger pain medication, please be aware these medications can cause nausea and constipation. Prevent nausea by taking the medication with a snack or meal. Avoid constipation by drinking plenty of fluids and eating foods with a high amount of fiber, such as fruits, vegetables, and grains.   Do not take Tylenol if you are taking prescription pain medications.  Follow Up  Our office will schedule a follow up appointment 2-3 weeks following discharge.  Please call us immediately for any of the following conditions  . Increased pain, redness, drainage (pus) from your incision site. . Fever of 101 degrees or higher. . If you should develop stroke (slurred speech, difficulty swallowing, weakness on one side of your body, loss of vision) you should call 911 and go to the nearest emergency room. .  Reduce your risk of vascular disease:  . Stop smoking. If you would like help call [facility] at 1-800-QUIT-NOW (1-800-784-8669) or [place] at 336-586-4000. . Manage your cholesterol . Maintain a desired weight . Control your diabetes . Keep your blood pressure down .  If you have any questions, please call the office at [phone]. 

## 2023-05-14 NOTE — Interval H&P Note (Signed)
History and Physical Interval Note:  05/14/2023 10:29 AM  Logan Wright  has presented today for surgery, with the diagnosis of RIGHT CAROTID STEONIS.  The various methods of treatment have been discussed with the patient and family. After consideration of risks, benefits and other options for treatment, the patient has consented to  Procedure(s): Transcarotid Artery Revascularization (Right) as a surgical intervention.  The patient's history has been reviewed, patient examined, no change in status, stable for surgery.  I have reviewed the patient's chart and labs.  Questions were answered to the patient's satisfaction.     Leonie Douglas

## 2023-05-14 NOTE — Anesthesia Postprocedure Evaluation (Signed)
Anesthesia Post Note  Patient: Logan Wright  Procedure(s) Performed: Transcarotid Artery Revascularization (Right: Neck) ULTRASOUND GUIDANCE FOR VASCULAR ACCESS (Left: Groin)     Patient location during evaluation: PACU Anesthesia Type: General Level of consciousness: awake and alert Pain management: pain level controlled Vital Signs Assessment: post-procedure vital signs reviewed and stable Respiratory status: spontaneous breathing, nonlabored ventilation, respiratory function stable and patient connected to nasal cannula oxygen Cardiovascular status: blood pressure returned to baseline and stable Postop Assessment: no apparent nausea or vomiting Anesthetic complications: no   No notable events documented.  Last Vitals:  Vitals:   05/14/23 1359 05/14/23 1456  BP: (!) 88/44   Pulse: (!) 47 (!) 43  Resp: 14   Temp: (!) 35.7 C   SpO2: 98%     Last Pain:  Vitals:   05/14/23 1359  TempSrc: Axillary  PainSc:                  Nelle Don Kedric Bumgarner

## 2023-05-14 NOTE — Progress Notes (Signed)
PHARMACIST LIPID MONITORING   Logan Wright is a 77 y.o. male admitted on 05/14/2023 s/p CEA.  Pharmacy has been consulted to optimize lipid-lowering therapy with the indication of secondary prevention for clinical ASCVD.  Recent Labs:  Lipid Panel (last 6 months):   Lab Results  Component Value Date   CHOL 130 05/06/2023   TRIG 88 05/06/2023   HDL 36 (L) 05/06/2023   CHOLHDL 3.6 05/06/2023   VLDL 18 05/06/2023   LDLCALC 76 05/06/2023    Hepatic function panel (last 6 months):   Lab Results  Component Value Date   AST 30 05/14/2023   ALT 28 05/14/2023   ALKPHOS 70 05/14/2023   BILITOT 0.8 05/14/2023    SCr (since admission):   Serum creatinine: 1.21 mg/dL 16/10/96 0454 Estimated creatinine clearance: 47 mL/min  Current therapy and lipid therapy tolerance Current lipid-lowering therapy: atorvastatin Documented or reported allergies or intolerances to lipid-lowering therapies (if applicable): none  Assessment:   Would consider adding ezetimibe due to LDl= 76 on atorvastatin 80mg /day.  He prefers to discuss with his primary care provider before making this addition  Plan:    1.Statin intensity (high intensity recommended for all patients regardless of the LDL):  No statin changes. The patient is already on a high intensity statin.  2.Add ezetimibe (if any one of the following):   see above  3.Refer to lipid clinic:   No  4.Follow-up with:  Primary care provider - Jackelyn Poling, DO  5.Follow-up labs after discharge:  No changes in lipid therapy, repeat a lipid panel in one year.     Harland German, PharmD Clinical Pharmacist **Pharmacist phone directory can now be found on [email or web address] ([email or web address]).  Listed under [facility].

## 2023-05-14 NOTE — Anesthesia Procedure Notes (Signed)
Arterial Line Insertion Start/End8/10/2022 10:05 AM Performed by: Garfield Cornea, CRNA, CRNA  Patient location: Pre-op. Preanesthetic checklist: patient identified, IV checked, site marked, risks and benefits discussed, surgical consent, monitors and equipment checked, pre-op evaluation, timeout performed and anesthesia consent Lidocaine 1% used for infiltration Right, radial was placed Catheter size: 20 G Hand hygiene performed  and maximum sterile barriers used   Attempts: 1 Procedure performed without using ultrasound guided technique. Following insertion, dressing applied and Biopatch. Post procedure assessment: normal and unchanged  Patient tolerated the procedure well with no immediate complications.

## 2023-05-15 ENCOUNTER — Encounter (HOSPITAL_COMMUNITY): Payer: Self-pay | Admitting: Vascular Surgery

## 2023-05-15 MED ORDER — SODIUM CHLORIDE 0.9 % IV SOLN
500.0000 mL | Freq: Once | INTRAVENOUS | Status: AC | PRN
  Administered 2023-05-15: 500 mL via INTRAVENOUS

## 2023-05-15 MED ORDER — DOPAMINE-DEXTROSE 3.2-5 MG/ML-% IV SOLN
10.0000 ug/kg/min | INTRAVENOUS | Status: DC
  Filled 2023-05-15: qty 250

## 2023-05-15 MED ORDER — PSEUDOEPHEDRINE HCL 30 MG PO TABS
30.0000 mg | ORAL_TABLET | Freq: Four times a day (QID) | ORAL | Status: DC
  Administered 2023-05-15 – 2023-05-16 (×5): 30 mg via ORAL
  Filled 2023-05-15 (×6): qty 1

## 2023-05-15 NOTE — Progress Notes (Signed)
Patient seen serially throughout the day. He has been up walking, eating meals, and talking. He is has no complaints, except that he wants to go home. He continues to have asymptomatic bradycardia and hypotension.  Continue pseudoephedrine. Will start dopamine if SBP < 80. He clinically looks excellent. Hopeful his HR / BP will rebound tomorrow. Please call for any questions.  Rande Brunt. Lenell Antu, MD [facility] Phone Number: [phone] 05/15/2023 5:26 PM

## 2023-05-15 NOTE — Plan of Care (Signed)
  Problem: Education: Goal: Knowledge of General Education information will improve Description Including pain rating scale, medication(s)/side effects and non-pharmacologic comfort measures Outcome: Progressing   

## 2023-05-15 NOTE — Progress Notes (Signed)
With midnight vital sign check, pt's SBP<100. Bolus implemented per standing order. Follow-up SBP remained <100. Repeat Bolus implemented. BP recheck @0215  was 91/49. Of note, pt is also bradycardic in 40s-50s but is completely asymptomatic despite these values. Will continue to monitor and notify provider on-call of SBP<90 per standing order.

## 2023-05-15 NOTE — Progress Notes (Signed)
Cori, [place] for vascular updated this morning on pt's BPs and HR throughout the night. No new orders received, she endorsed she would discuss with Dr. Lenell Antu. Day RN, Kizzie Furnish updated as well.

## 2023-05-15 NOTE — Progress Notes (Addendum)
  Progress Note    05/15/2023 8:08 AM 1 Day Post-Op  Subjective:  no complaints. Sitting up in chair eating breakfast   Vitals:   05/15/23 0655 05/15/23 0700  BP: (!) 97/55 (!) 93/48  Pulse: (!) 47 (!) 45  Resp:    Temp:    SpO2: 93% 90%   Physical Exam: Cardiac:  regular Lungs:  non labored Incisions:  right neck incision is clean, dry and intact without swelling or hematoma Extremities:  moving all extremities without deficits Neurologic: alert and oriented. Speech coherent. Smile symmetric. Tongue midline  CBC    Component Value Date/Time   WBC 8.7 05/15/2023 0440   RBC 3.72 (L) 05/15/2023 0440   HGB 10.6 (L) 05/15/2023 0440   HGB 13.5 07/10/2022 1027   HGB 14.4 08/09/2020 1153   HCT 32.2 (L) 05/15/2023 0440   HCT 44.5 08/09/2020 1153   PLT 174 05/15/2023 0440   PLT 218 07/10/2022 1027   PLT 359 08/09/2020 1153   MCV 86.6 05/15/2023 0440   MCV 86 08/09/2020 1153   MCH 28.5 05/15/2023 0440   MCHC 32.9 05/15/2023 0440   RDW 13.7 05/15/2023 0440   RDW 14.9 08/09/2020 1153   LYMPHSABS 2.0 05/05/2023 1045   MONOABS 0.8 05/05/2023 1045   EOSABS 0.1 05/05/2023 1045   BASOSABS 0.0 05/05/2023 1045    BMET    Component Value Date/Time   NA 137 05/15/2023 0440   NA 142 08/09/2020 1153   K 3.9 05/15/2023 0440   CL 107 05/15/2023 0440   CO2 21 (L) 05/15/2023 0440   GLUCOSE 96 05/15/2023 0440   BUN 21 05/15/2023 0440   BUN 19 08/09/2020 1153   CREATININE 1.27 (H) 05/15/2023 0440   CREATININE 1.14 07/10/2022 1027   CREATININE 1.47 (H) 08/24/2015 1257   CALCIUM 7.7 (L) 05/15/2023 0440   GFRNONAA 58 (L) 05/15/2023 0440   GFRNONAA >60 07/10/2022 1027   GFRAA 54 (L) 08/09/2020 1153    INR    Component Value Date/Time   INR 1.1 05/14/2023 0923     Intake/Output Summary (Last 24 hours) at 05/15/2023 9147 Last data filed at 05/15/2023 0700 Gross per 24 hour  Intake 4626.82 ml  Output 1650 ml  Net 2976.82 ml     Assessment/Plan:  77 y.o. male is s/p R TCAR  1 Day Post-Op   Neurologically intact Right supraclavicular incision is clean, dry and intact without swelling or hematoma Blood pressure soft. Asymptomatic Sinus brady Order placed for pseudoephedrine Fluid bolus already given EKG ordered Possible discharge home this afternoon if pressure rebounds Aspirin, statin, Plavix and Eliquis Will have follow up in 1 month with carotid duplex   Graceann Congress, PA-C [facility] [phone] 05/15/2023 8:08 AM  VASCULAR STAFF ADDENDUM: I have independently interviewed and examined the patient. I agree with the above.  Asymptomatic bradycardia and hypotension overnight. Likely result from carotid intervention. Pseudophedrine given. Will monitor response. If patient becomes symptomatic or systolic pressure drops <60, will start vasopressor support. Will re-evaluate after lunch.  Rande Brunt. Lenell Antu, MD [facility] Phone Number: [phone] 05/15/2023 9:17 AM

## 2023-05-16 MED ORDER — OXYCODONE HCL 5 MG PO TABS: 5.0000 mg | ORAL_TABLET | ORAL | 0 refills | Status: DC | PRN

## 2023-05-16 NOTE — Progress Notes (Addendum)
Progress Note    05/16/2023 8:02 AM 2 Days Post-Op  Subjective:  feels great this morning. Really wants to go home. No dizziness or weakness    Vitals:   05/15/23 2338 05/16/23 0321  BP: (!) 104/46 (!) 106/50  Pulse: (!) 44 (!) 47  Resp: 18 18  Temp: 98.8 F (37.1 C) 98.6 F (37 C)  SpO2: 94% 93%    Physical Exam: General:  walking around in the room, no acute distress Cardiac:  sinus bradycardia in the 40s-60s. BP stable with SBP>100 Lungs:  nonlabored Incisions:  right sided neck incision c/d/l Extremities: palpable and equal radial pulses Neuro: intact without sensorimotor deficit. No slurred speech or facial droop  CBC    Component Value Date/Time   WBC 8.7 05/15/2023 0440   RBC 3.72 (L) 05/15/2023 0440   HGB 10.6 (L) 05/15/2023 0440   HGB 13.5 07/10/2022 1027   HGB 14.4 08/09/2020 1153   HCT 32.2 (L) 05/15/2023 0440   HCT 44.5 08/09/2020 1153   PLT 174 05/15/2023 0440   PLT 218 07/10/2022 1027   PLT 359 08/09/2020 1153   MCV 86.6 05/15/2023 0440   MCV 86 08/09/2020 1153   MCH 28.5 05/15/2023 0440   MCHC 32.9 05/15/2023 0440   RDW 13.7 05/15/2023 0440   RDW 14.9 08/09/2020 1153   LYMPHSABS 2.0 05/05/2023 1045   MONOABS 0.8 05/05/2023 1045   EOSABS 0.1 05/05/2023 1045   BASOSABS 0.0 05/05/2023 1045    BMET    Component Value Date/Time   NA 137 05/15/2023 0440   NA 142 08/09/2020 1153   K 3.9 05/15/2023 0440   CL 107 05/15/2023 0440   CO2 21 (L) 05/15/2023 0440   GLUCOSE 96 05/15/2023 0440   BUN 21 05/15/2023 0440   BUN 19 08/09/2020 1153   CREATININE 1.27 (H) 05/15/2023 0440   CREATININE 1.14 07/10/2022 1027   CREATININE 1.47 (H) 08/24/2015 1257   CALCIUM 7.7 (L) 05/15/2023 0440   GFRNONAA 58 (L) 05/15/2023 0440   GFRNONAA >60 07/10/2022 1027   GFRAA 54 (L) 08/09/2020 1153    INR    Component Value Date/Time   INR 1.1 05/14/2023 0923     Intake/Output Summary (Last 24 hours) at 05/16/2023 0802 Last data filed at 05/15/2023 1950 Gross  per 24 hour  Intake 360 ml  Output 700 ml  Net -340 ml      Assessment/Plan:  77 y.o. male is 2 days post op, s/p: R TCAR   -Doing well this morning and really wants to go home. No pain at incision site -R sided neck incision c/d/l without hematoma -Neuro intact. Moving all extremities equally -BP greatly improved since yesterday with SBPs >100 -Asymptomatic bradycardia with HR in 40s-60s -Okay for patient to go home today. Will make sure he reaches out to his PCP in the next week to discuss his BP/HR -Will arrange f/u in 4 wks with carotid duplex. Continue ASA, plavix, statin   Loel Dubonnet, PA-C [facility] [phone] 05/16/2023 8:02 AM  VASCULAR STAFF ADDENDUM: I have independently interviewed and examined the patient. I agree with the above.  Much improved from HR/BP standpoint. Hold Cardizem for now. Encouraged him to follow up with PCP this week to discuss resuming Cardizem. Follow up with me in 1 month with carotid duplex. Continue ASA / Plavix DC today.  Rande Brunt. Lenell Antu, MD [facility] Phone Number: [phone] 05/16/2023 10:02 AM

## 2023-05-16 NOTE — Plan of Care (Signed)
  Problem: Education: Goal: Knowledge of General Education information will improve Description Including pain rating scale, medication(s)/side effects and non-pharmacologic comfort measures Outcome: Progressing   

## 2023-05-16 NOTE — Progress Notes (Signed)
Discharge instructions given. Patient verbalized understanding and all questions were answered.  ?

## 2023-05-18 ENCOUNTER — Other Ambulatory Visit (HOSPITAL_BASED_OUTPATIENT_CLINIC_OR_DEPARTMENT_OTHER): Payer: Self-pay

## 2023-05-18 ENCOUNTER — Encounter: Payer: Self-pay | Admitting: Cardiovascular Disease

## 2023-05-18 ENCOUNTER — Telehealth: Payer: Self-pay

## 2023-05-18 NOTE — Discharge Summary (Signed)
Discharge Summary     Logan Wright 1946-02-20 77 y.o. male  [id number]  Admission Date: 05/14/2023  Discharge Date: 05/16/2023  Physician: Heath Lark, MD  Admission Diagnosis: Carotid stenosis [I65.29] Symptomatic carotid artery stenosis, right [I65.21]  Discharge Day Diagnosis: Carotid stenosis [I65.29] Symptomatic carotid artery stenosis, right [facility]  Hospital Course:  The patient was admitted to the hospital and taken to the operating room on 05/14/2023 and underwent right TCAR.  The pt tolerated the procedure well and was transported to the PACU in good condition.  By POD 1, the pt neuro status was intact. His right sided neck incision was intact and dry. Overnight he had asymptomatic bradycardia and hypotension with HR in the 40s and SBPs 70s-80s. He was given fluid bolus and started on Pseudophedrine p.o. for BP support. It was felt his hypotension was due to carotid bulb irritation from surgery. He was otherwise tolerating a normal diet and mobilizing well.  By POD 2, the patient was still neuro intact. He still had asymptomatic bradycardia with slight improvement, HR in the 40s-60s. His BP greatly improved with SBPs in the 100-110s. He was to refrain from taking his Cardizem.   He was discharged home on POD 2. He was to continue holding his Cardizem. He was encouraged to follow up with his PCP in 1 week to discuss his HR/BP.  No results for input(s): "NA", "K", "CL", "CO2", "GLUCOSE", "BUN", "CALCIUM" in the last 72 hours.  Invalid input(s): "CRATININE" No results for input(s): "WBC", "HGB", "HCT", "PLT" in the last 72 hours. No results for input(s): "INR" in the last 72 hours.   Discharge Instructions     Call MD for:  redness, tenderness, or signs of infection (pain, swelling, redness, odor or green/yellow discharge around incision site)   Complete by: As directed    Call MD for:  severe uncontrolled pain   Complete by: As directed    Call MD for:   temperature >100.4   Complete by: As directed    Diet - low sodium heart healthy   Complete by: As directed    Increase activity slowly   Complete by: As directed        Discharge Diagnosis:  Carotid stenosis [I65.29] Symptomatic carotid artery stenosis, right [I65.21]  Secondary Diagnosis: Patient Active Problem List   Diagnosis Date Noted   Carotid stenosis 05/14/2023   Symptomatic carotid artery stenosis, right 05/14/2023   TIA (transient ischemic attack) 05/05/2023   Coronary artery calcification seen on CT scan 03/30/2023   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   Paroxysmal atrial fibrillation (HCC) 07/22/2020   Port-A-Cath in place 06/28/2020   Genetic testing 05/14/2020   S/P mastectomy, right 03/16/2020   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, 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/14/2020   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   Non-cardiac chest pain    Gynecomastia    Hypercholesteremia    Seborrhea    Severe frontal headaches    GERD (gastroesophageal reflux disease)    ED (erectile dysfunction)    Diverticulosis    Elevated blood pressure    Vertigo    Onychomycosis of foot with other complication    Hearing loss    Colon adenoma    OA (osteoarthritis)    Esophageal reflux    Chest pain 07/08/2015   Essential hypertension 07/08/2015   Hyperlipidemia 07/08/2015   Past Medical History:  Diagnosis Date   Ascending aorta dilatation (HCC)    Ascending  aorta dilation (HCC) 03/30/2023   Echocardiogram 07/2020: 42 mm   Breast cancer (HCC) 2021   right breast   Breast cancer, right (HCC)    CKD (chronic kidney disease), stage III (HCC)    Colon adenoma 2015   Coronary artery calcification seen on CT scan    Diverticulosis    DENIES    DJD (degenerative joint disease)    Dysrhythmia    A-fib   ED (erectile dysfunction)    Elevated blood pressure 2007-2008   Esophageal reflux    GERD (gastroesophageal  reflux disease)    Gout    Gynecomastia    LEFT   Hearing loss    HTN (hypertension)    Hypercholesteremia    Non-cardiac chest pain    OA (osteoarthritis)    LEFT SHOULDER   Onychomycosis of foot with other complication    PAF (paroxysmal atrial fibrillation) (HCC)    Pre-diabetes    Seborrhea    Severe frontal headaches    RESOLVED    Vertigo    RESOLVED    Wears glasses    Wears hearing aid     Allergies as of 05/16/2023   No Known Allergies      Medication List     STOP taking these medications    diltiazem 120 MG 24 hr capsule Commonly known as: CARDIZEM CD       TAKE these medications    allopurinol 100 MG tablet Commonly known as: ZYLOPRIM Take 200 mg by mouth daily.   apixaban 5 MG Tabs tablet Commonly known as: Eliquis Take 1 tablet (5 mg total) by mouth 2 (two) times daily. Hold this medication 48 prior to vascular surgery.   aspirin EC 81 MG tablet Take 81 mg by mouth daily. Swallow whole.   atorvastatin 80 MG tablet Commonly known as: LIPITOR Take 80 mg by mouth at bedtime.   clopidogrel 75 MG tablet Commonly known as: PLAVIX Take 1 tablet (75 mg total) by mouth daily.   Fish Oil Ultra 1400 MG Caps Take 1,400-2,800 mg by mouth See admin instructions. Take 1400 mg in the morning and 2800 mg in the evening   GLUCOSAMINE-CHONDROITIN PO Take 2 tablets by mouth daily.   multivitamin tablet Take 1 tablet by mouth daily.   oxyCODONE 5 MG immediate release tablet Commonly known as: Oxy IR/ROXICODONE Take 1 tablet (5 mg total) by mouth every 4 (four) hours as needed for moderate pain.   Potassium 99 MG Tabs Take 1 tablet by mouth daily.   tamoxifen 20 MG tablet Commonly known as: NOLVADEX Take 1 tablet (20 mg total) by mouth daily.         Discharge Instructions:   [facility] Discharge Instructions Carotid Revascularization  Please refer to the following instructions for your post-procedure  care. Your surgeon or physician assistant will discuss any changes with you.  Activity  You are encouraged to walk as much as you can. You can slowly return to normal activities but must avoid strenuous activity and heavy lifting until your doctor tell you it's OK. Avoid activities such as vacuuming or swinging a golf club. You can drive after one week if you are comfortable and you are no longer taking prescription pain medications. It is normal to feel tired for serval weeks after your surgery. It is also normal to have difficulty with sleep habits, eating, and bowel movements after surgery. These will go away with time.  Bathing/Showering  You may shower  after you come home. Do not soak in a bathtub, hot tub, or swim until the incision heals completely.  Incision Care  Shower every day. Clean your incision with mild soap and water. Pat the area dry with a clean towel. You do not need a bandage unless otherwise instructed. Do not apply any ointments or creams to your incision. You may have skin glue on your incision. Do not peel it off. It will come off on its own in about one week. Your incision may feel thickened and raised for several weeks after your surgery. This is normal and the skin will soften over time. For Men Only: It's OK to shave around the incision but do not shave the incision itself for 2 weeks. It is common to have numbness under your chin that could last for several months.  Diet  Resume your normal diet. There are no special food restrictions following this procedure. A low fat/low cholesterol diet is recommended for all patients with vascular disease. In order to heal from your surgery, it is CRITICAL to get adequate nutrition. Your body requires vitamins, minerals, and protein. Vegetables are the best source of vitamins and minerals. Vegetables also provide the perfect balance of protein. Processed food has little nutritional value, so try to avoid  this.  Medications  Resume taking all of your medications unless your doctor or physician assistant tells you not to.  If your incision is causing pain, you may take over-the- counter pain relievers such as acetaminophen (Tylenol). If you were prescribed a stronger pain medication, please be aware these medications can cause nausea and constipation.  Prevent nausea by taking the medication with a snack or meal. Avoid constipation by drinking plenty of fluids and eating foods with a high amount of fiber, such as fruits, vegetables, and grains. Do not take Tylenol if you are taking prescription pain medications.  Follow Up  Our office will schedule a follow up appointment 2-3 weeks following discharge.  Please call us immediately for any of the following conditions  Increased pain, redness, drainage (pus) from your incision site. Fever of 101 degrees or higher. If you should develop stroke (slurred speech, difficulty swallowing, weakness on one side of your body, loss of vision) you should call 911 and go to the nearest emergency room.  Reduce your risk of vascular disease:  Stop smoking. If you would like help call [facility] at 1-800-QUIT-NOW (820-799-3057) or [place] at [phone]. Manage your cholesterol Maintain a desired weight Control your diabetes Keep your blood pressure down  If you have any questions, please call the office at [phone].  Disposition: Home  Patient's condition: is Excellent  Follow up: 1. Dr.Hawken in 4 wks with carotid duplex   Loel Dubonnet, PA-C [facility] [phone]   --- For [facility] Registry use ---   Modified Rankin score at D/C (0-6): 0  IV medication needed for:  1. Hypertension: No 2. Hypotension: No  Post-op Complications: No  1. Post-op CVA or TIA: No  If yes: Event classification (right eye, left eye, right cortical, left cortical, verterobasilar, other):   If yes: Timing of event (intra-op, <6 hrs  post-op, >=6 hrs post-op, unknown):   2. CN injury: No  If yes: CN  injuried   3. Myocardial infarction: No  If yes: Dx by (EKG or clinical, Troponin):   4.  CHF: No  5.  Dysrhythmia (new): No  6. Wound infection: No  7. Reperfusion symptoms: No  8. Return to  OR: No  If yes: return to OR for (bleeding, neurologic, other CEA incision, other):   Discharge medications: Statin use:  Yes ASA use:  Yes   Beta blocker use:  No ACE-Inhibitor use:  No  ARB use:  No CCB use: Yes P2Y12 Antagonist use: Yes, [ ]  Plavix, [ ]  Plasugrel, [ ]  Ticlopinine, [ ]  Ticagrelor, [ ]  Other, [ ]  No for medical reason, [ ]  Non-compliant, [ ]  Not-indicated Anti-coagulant use:  Yes, [x]  Eliquis

## 2023-05-18 NOTE — Telephone Encounter (Signed)
Pt called stating that he had a large bruise on his abdomen, his pulse was 40-45 bpm, and his BP has been reading mostly 90's/50's since his surgery.  Reviewed pt's chart, returned call for clarification, two identifiers used. Pt stated that the bruise was just above his penis, 3" wide, and extended 6-7" to his left. He denies any pain or firmness under the bruise. He stated that Dr. Lenell Antu has taken him off his Diltiazem and he was concerned with his HR and BP. Instructed him to contact his cardiologist and get their advice on POC. Confirmed understanding.

## 2023-05-18 NOTE — Telephone Encounter (Signed)
Returned call to patient who states since discharge from Carotid stent placement, his home BP cuff shows low BP readings and HR readings. States he was told when this began occurring in the hospital by the MD, that while these were not normal readings, they were not overly concerned either because it likely would stabilize with time. He denies symptoms associated with these readings, aside from a vague feeling of feeling off balance some, but cannot attest if this occurs with positional changes only versus at rest as well. Asked him to palpate his pulse while on the phone to verify if monitor was reading correctly, but he repeatedly states he cannot feel his L carotid or his radial pulses. He has appt tomorrow with his PCP. Advised him that I can place him on schedule with nahser for this Friday as I cannot further assess his HR over the phone and without an EKG and while not emergent, certainly not his normal (mid-60's per chart) so does need to be addressed. Explained that if he sees his PCP tomorrow and HR is normal, and preferably EKG is done, that he doesn't need to keep appt with [place]. He verbalizes understanding. Asked if does keep upcoming appt, that he bring a log of BP's and HR's with him since discharge.

## 2023-05-22 ENCOUNTER — Ambulatory Visit: Payer: PPO | Attending: Cardiovascular Disease | Admitting: Cardiovascular Disease

## 2023-05-22 ENCOUNTER — Encounter: Payer: Self-pay | Admitting: Cardiovascular Disease

## 2023-05-22 VITALS — BP 122/64 | HR 49 | Ht 67.0 in | Wt 177.0 lb

## 2023-05-22 DIAGNOSIS — I480 Paroxysmal atrial fibrillation: Secondary | ICD-10-CM

## 2023-05-22 DIAGNOSIS — E782 Mixed hyperlipidemia: Secondary | ICD-10-CM

## 2023-05-22 NOTE — Progress Notes (Signed)
Cardiology Office Note:    Date:  05/22/2023   ID:  Logan Wright, DOB [birth year], MRN [id number]  PCP:  Jackelyn Poling, DO  Cardiologist:  Kristeen Miss, MD  Electrophysiologist:  None   Referring MD: Jackelyn Poling, DO   Chief Complaint  Patient presents with   Hypertension        Atrial Fibrillation          Feb. 10, 2020    Logan Wright is a 77 y.o. male with a hx of hypertension and atypical chest pain.  Asked to see him today for preoperative evaluation  By Dr. Charlann Boxer prior to left total knee replacement.  Has seen Dr. Katrinka Blazing in the past ( at his request)  Now he was told that he needs cardiac clearence.   Exercises regularly .   Aerobic and weight lifting . Does a boot camp several times a week.  He averages exercising 5 times a week. Recently has been having some left knee problems so he is been cycling more.  Not any chest pain or shortness of breath while exercising.  No syncope or presyncope. He has held his aspirin for the past 4 days. Has also been taking Turmeric.     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   Jan. 27, 2022: Logan Wright is seen back today for follow up of his HTN and atrial fib.   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  Was diagnosed with atrial fib .   Was treated with rate control and anticoagulation.  He did not have a cardioversion but converted on his own to NSR   We had referred him for a sleep study - we had tried to refer him to Dr. Mayford Knife .   But he never heard back from her .  He has contacted Benjaman Kindler and will be doing the home sleep study.  Has lost 30 lbs . Does not have any questions   CHADS2VASC is 53 ( age, HTN)    Seen back today for follow-up of his hypertension and atrial fibrillation.  He status post mastectomy for right breast cancer.  Getting lots of exercise   Hiked out in [place] . [place] .  No cp, no dyspnea. Lipids from his primary MD look  good LDL = 78 HDL = 42 Trigs = 93 Chol = 138    May 22 2023:  We are seen for follow-up of his paroxysmal atrial fibrillation, hypertension. HR has been slower .  Has felt foggy over the past week  Diltiazem was stopped .   HR has been going up since he stopped the dilt   Had a recent right carotid stent  Is on Eliquis, ASA, plavix   Works out on recumbant bike for 45 min - 1 Hr.    1 min step test HR increased from 50 to 92 in 1 min of stepping up .         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   Echocardiogram 07/2020: 42 mm   Breast cancer (HCC) 2021   right breast   Breast cancer, right (HCC)    CKD (chronic kidney disease), stage III (HCC)    Colon adenoma 2015   Coronary artery calcification seen on CT scan    Diverticulosis    DENIES    DJD (  degenerative joint disease)    Dysrhythmia    A-fib   ED (erectile dysfunction)    Elevated blood pressure 2007-2008   Esophageal reflux    GERD (gastroesophageal reflux disease)    Gout    Gynecomastia    LEFT   Hearing loss    HTN (hypertension)    Hypercholesteremia    Non-cardiac chest pain    OA (osteoarthritis)    LEFT SHOULDER   Onychomycosis of foot with other complication    PAF (paroxysmal atrial fibrillation) (HCC)    Pre-diabetes    Seborrhea    Severe frontal headaches    RESOLVED    Vertigo    RESOLVED    Wears glasses    Wears hearing aid     Past Surgical History:  Procedure Laterality Date   COLONSCOPY      HIP ARTHROPLASTY Bilateral    AT [place]    IR CV LINE INJECTION  05/14/2020   IR IMAGING GUIDED PORT INSERTION  05/17/2020   IR REMOVAL TUN ACCESS W/ PORT W/O FL MOD SED  05/17/2020   KNEE ARTHROSCOPY     UNSURE WHICH KNEE    MASTECTOMY Right 03/16/2020   MASTECTOMY W/ SENTINEL NODE BIOPSY Right 03/16/2020   Procedure: RIGHT BREAST MASTECTOMY WITH SENTINEL LYMPH NODE BIOPSY;  Surgeon:  Abigail Miyamoto, MD;  Location: [facility];  Service: General;  Laterality: Right;   PORTACATH PLACEMENT Left 05/08/2020   Procedure: INSERTION PORT-A-CATH WITH ULTRASOUND;  Surgeon: Abigail Miyamoto, MD;  Location: [facility] OR;  Service: General;  Laterality: Left;  LMA   TOTAL KNEE ARTHROPLASTY Left 11/23/2018   Procedure: TOTAL KNEE ARTHROPLASTY;  Surgeon: Durene Romans, MD;  Location: [facility];  Service: Orthopedics;  Laterality: Left;  70 mins   TRANSCAROTID ARTERY REVASCULARIZATION  Right 05/14/2023   Procedure: Transcarotid Artery Revascularization;  Surgeon: Leonie Douglas, MD;  Location: [facility] OR;  Service: Vascular;  Laterality: Right;   ULTRASOUND GUIDANCE FOR VASCULAR ACCESS Left 05/14/2023   Procedure: ULTRASOUND GUIDANCE FOR VASCULAR ACCESS;  Surgeon: Leonie Douglas, MD;  Location: [facility];  Service: Vascular;  Laterality: Left;    Current Medications: Current Meds  Medication Sig   allopurinol (ZYLOPRIM) 100 MG tablet Take 200 mg by mouth daily.   apixaban (ELIQUIS) 5 MG TABS tablet Take 1 tablet (5 mg total) by mouth 2 (two) times daily. Hold this medication 48 prior to vascular surgery.   aspirin EC 81 MG tablet Take 81 mg by mouth daily. Swallow whole.   atorvastatin (LIPITOR) 80 MG tablet Take 80 mg by mouth at bedtime.   clopidogrel (PLAVIX) 75 MG tablet Take 1 tablet (75 mg total) by mouth daily.   GLUCOSAMINE-CHONDROITIN PO Take 2 tablets by mouth daily.   Multiple Vitamin (MULTIVITAMIN) tablet Take 1 tablet by mouth daily.    Omega-3 Fatty Acids (FISH OIL ULTRA) 1400 MG CAPS Take 1,400-2,800 mg by mouth See admin instructions. Take 1400 mg in the morning and 2800 mg in the evening   oxyCODONE (OXY IR/ROXICODONE) 5 MG immediate release tablet Take 1 tablet (5 mg total) by mouth every 4 (four) hours as needed for moderate pain.   Potassium 99 MG TABS Take 1 tablet by mouth daily.   tamoxifen (NOLVADEX) 20 MG tablet Take 1 tablet (20 mg total) by mouth daily.      Allergies:   Patient has no known allergies.   Social History   Socioeconomic History   Marital status: Married  Spouse name: Not on file   Number of children: Not on file   Years of education: Not on file   Highest education level: Not on file  Occupational History   Not on file  Tobacco Use   Smoking status: Never   Smokeless tobacco: Never  Vaping Use   Vaping status: Never Used  Substance and Sexual Activity   Alcohol use: No    Alcohol/week: 0.0 standard drinks of alcohol   Drug use: No   Sexual activity: Not on file  Other Topics Concern   Not on file  Social History Narrative   Not on file   Social Determinants of Health   Financial Resource Strain: Not on file  Food Insecurity: No Food Insecurity (05/05/2023)   Hunger Vital Sign    Worried About Running Out of Food in the Last Year: Never true    Ran Out of Food in the Last Year: Never true  Transportation Needs: No Transportation Needs (05/05/2023)   PRAPARE - Administrator, Civil Service (Medical): No    Lack of Transportation (Non-Medical): No  Physical Activity: Not on file  Stress: Not on file  Social Connections: Not on file     Family History: The patient's family history includes Heart attack in his mother; Heart failure in his father; Stroke in his mother. There is no history of Hypertension.  ROS:   Please see the history of present illness.     All other systems reviewed and are negative.  EKGs/Labs/Other Studies Reviewed:    The following studies were reviewed today:   EKG:       Recent Labs: 05/14/2023: ALT 28 05/15/2023: BUN 21; Creatinine, Ser 1.27; Hemoglobin 10.6; Platelets 174; Potassium 3.9; Sodium 137  Recent Lipid Panel    Component Value Date/Time   CHOL 130 05/06/2023 0047   TRIG 88 05/06/2023 0047   HDL 36 (L) 05/06/2023 0047   CHOLHDL 3.6 05/06/2023 0047   VLDL 18 05/06/2023 0047   LDLCALC 76 05/06/2023 0047    Physical Exam:    Physical Exam: Blood  pressure 122/64, pulse (!) 49, height 5\' 7"  (1.702 m), weight 177 lb (80.3 kg), SpO2 99%.      GEN:  Well nourished, well developed in no acute distress HEENT: Normal NECK: No JVD; No carotid bruits LYMPHATICS: No lymphadenopathy CARDIAC: RRR , no murmurs, rubs, gallops RESPIRATORY:  Clear to auscultation without rales, wheezing or rhonchi  ABDOMEN: Soft, non-tender, non-distended MUSCULOSKELETAL:  No edema; No deformity  SKIN: Warm and dry NEUROLOGIC:  Alert and oriented x 3    ASSESSMENT:    1. PAF (paroxysmal atrial fibrillation) (HCC)   2. Mixed hyperlipidemia     PLAN:       HTN:        BP is well controlled.   2.  Atrial fib :   PAF ,   no recent episodes.  We have stopped his diltiazem.  He has baseline bradycardia.  I suspect this is because of his high vagal tone.  3.  Sinus bradycardia:  Dilt has been stopped  He has intact chronotropic response to exercise   Will see him again in 6 months.     Medication Adjustments/Labs and Tests Ordered: Current medicines are reviewed at length with the patient today.  Concerns regarding medicines are outlined above.  No orders of the defined types were placed in this encounter.  No orders of the defined types were placed in this encounter.  Patient Instructions  Medication Instructions:  Your physician recommends that you continue on your current medications as directed. Please refer to the Current Medication list given to you today.  *If you need a refill on your cardiac medications before your next appointment, please call your pharmacy*   Lab Work: NONE If you have labs (blood work) drawn today and your tests are completely normal, you will receive your results only by: MyChart Message (if you have MyChart) OR A paper copy in the mail If you have any lab test that is abnormal or we need to change your treatment, we will call you to review the results.   Testing/Procedures: NONE   Follow-Up: At [facility], you and your health needs are our priority.  As part of our continuing mission to provide you with exceptional heart care, we have created designated Provider Care Teams.  These Care Teams include your primary Cardiologist (physician) and Advanced Practice Providers (APPs -  Physician Assistants and Nurse Practitioners) who all work together to provide you with the care you need, when you need it.  We recommend signing up for the patient portal called "MyChart".  Sign up information is provided on this After Visit Summary.  MyChart is used to connect with patients for Virtual Visits (Telemedicine).  Patients are able to view lab/test results, encounter notes, upcoming appointments, etc.  Non-urgent messages can be sent to your provider as well.   To learn more about what you can do with MyChart, go to [email or web address].    Your next appointment:   6 month(s)  Provider:   Kristeen Miss, MD        Signed, Kristeen Miss, MD  05/22/2023 6:03 PM    [facility]

## 2023-05-22 NOTE — Patient Instructions (Signed)
Medication Instructions:  Your physician recommends that you continue on your current medications as directed. Please refer to the Current Medication list given to you today.  *If you need a refill on your cardiac medications before your next appointment, please call your pharmacy*   Lab Work: NONE If you have labs (blood work) drawn today and your tests are completely normal, you will receive your results only by: MyChart Message (if you have MyChart) OR A paper copy in the mail If you have any lab test that is abnormal or we need to change your treatment, we will call you to review the results.   Testing/Procedures: NONE   Follow-Up: At  HeartCare, you and your health needs are our priority.  As part of our continuing mission to provide you with exceptional heart care, we have created designated Provider Care Teams.  These Care Teams include your primary Cardiologist (physician) and Advanced Practice Providers (APPs -  Physician Assistants and Nurse Practitioners) who all work together to provide you with the care you need, when you need it.  We recommend signing up for the patient portal called "MyChart".  Sign up information is provided on this After Visit Summary.  MyChart is used to connect with patients for Virtual Visits (Telemedicine).  Patients are able to view lab/test results, encounter notes, upcoming appointments, etc.  Non-urgent messages can be sent to your provider as well.   To learn more about what you can do with MyChart, go to [email or web address].    Your next appointment:   6 month(s)  Provider:   Philip [name], MD      

## 2023-05-25 ENCOUNTER — Encounter (HOSPITAL_COMMUNITY): Payer: Self-pay

## 2023-05-25 ENCOUNTER — Other Ambulatory Visit: Payer: Self-pay

## 2023-05-25 ENCOUNTER — Ambulatory Visit (HOSPITAL_COMMUNITY): Payer: PPO

## 2023-05-25 MED ORDER — CLOPIDOGREL BISULFATE 75 MG PO TABS: 75.0000 mg | ORAL_TABLET | Freq: Every day | ORAL | 5 refills | Status: DC

## 2023-06-02 ENCOUNTER — Encounter: Payer: Self-pay | Admitting: Neurology

## 2023-06-02 ENCOUNTER — Ambulatory Visit: Payer: PPO | Admitting: Neurology

## 2023-06-02 VITALS — BP 139/78 | HR 61 | Ht 67.0 in | Wt 174.0 lb

## 2023-06-02 DIAGNOSIS — I480 Paroxysmal atrial fibrillation: Secondary | ICD-10-CM

## 2023-06-02 DIAGNOSIS — I6381 Other cerebral infarction due to occlusion or stenosis of small artery: Secondary | ICD-10-CM | POA: Diagnosis not present

## 2023-06-02 DIAGNOSIS — I6521 Occlusion and stenosis of right carotid artery: Secondary | ICD-10-CM

## 2023-06-02 NOTE — Patient Instructions (Addendum)
I had a long d/w patient about his recent stroke, TIA , PAF,and carotid stenosis,, risk for recurrent stroke/TIAs, personally independently reviewed imaging studies and stroke evaluation results and answered questions.Continue Eliquis (apixaban) daily  for secondary stroke prevention and maintain strict control of hypertension with blood pressure goal below 130/90, diabetes with hemoglobin A1c goal below 6.5% and lipids with LDL cholesterol goal below 70 mg/dL. I also advised the patient to eat a healthy diet with plenty of whole grains, cereals, fruits and vegetables, exercise regularly and maintain ideal body weight .patient is neurologically cleared to undergo dental cleaning as long as patient does not have to stop his antiplatelets or Eliquis for the procedure.  He may also consider possible participation in the [place] atrial fibrillation trial if interested and will be be given written information to review at home and decide.  Followup in the future with me in 6 months or call earlier if necessary  Stroke Prevention Some medical conditions and behaviors can lead to a higher chance of having a stroke. You can help prevent a stroke by eating healthy, exercising, not smoking, and managing any medical conditions you have. Stroke is a leading cause of functional impairment. Primary prevention is particularly important because a majority of strokes are first-time events. Stroke changes the lives of not only those who experience a stroke but also their family and other caregivers. How can this condition affect me? A stroke is a medical emergency and should be treated right away. A stroke can lead to brain damage and can sometimes be life-threatening. If a person gets medical treatment right away, there is a better chance of surviving and recovering from a stroke. What can increase my risk? The following medical conditions may increase your risk of a stroke: Cardiovascular disease. High blood pressure  (hypertension). Diabetes. High cholesterol. Sickle cell disease. Blood clotting disorders (hypercoagulable state). Obesity. Sleep disorders (obstructive sleep apnea). Other risk factors include: Being older than age 22. Having a history of blood clots, stroke, or mini-stroke (transient ischemic attack, TIA). Genetic factors, such as race, ethnicity, or a family history of stroke. Smoking cigarettes or using other tobacco products. Taking birth control pills, especially if you also use tobacco. Heavy use of alcohol or drugs, especially cocaine and methamphetamine. Physical inactivity. What actions can I take to prevent this? Manage your health conditions High cholesterol levels. Eating a healthy diet is important for preventing high cholesterol. If cholesterol cannot be managed through diet alone, you may need to take medicines. Take any prescribed medicines to control your cholesterol as told by your health care provider. Hypertension. To reduce your risk of stroke, try to keep your blood pressure below 130/80. Eating a healthy diet and exercising regularly are important for controlling blood pressure. If these steps are not enough to manage your blood pressure, you may need to take medicines. Take any prescribed medicines to control hypertension as told by your health care provider. Ask your health care provider if you should monitor your blood pressure at home. Have your blood pressure checked every year, even if your blood pressure is normal. Blood pressure increases with age and some medical conditions. Diabetes. Eating a healthy diet and exercising regularly are important parts of managing your blood sugar (glucose). If your blood sugar cannot be managed through diet and exercise, you may need to take medicines. Take any prescribed medicines to control your diabetes as told by your health care provider. Get evaluated for obstructive sleep apnea. Talk to your health care  provider  about getting a sleep evaluation if you snore a lot or have excessive sleepiness. Make sure that any other medical conditions you have, such as atrial fibrillation or atherosclerosis, are managed. Nutrition Follow instructions from your health care provider about what to eat or drink to help manage your health condition. These instructions may include: Reducing your daily calorie intake. Limiting how much salt (sodium) you use to 1,500 milligrams (mg) each day. Using only healthy fats for cooking, such as olive oil, canola oil, or sunflower oil. Eating healthy foods. You can do this by: Choosing foods that are high in fiber, such as whole grains, and fresh fruits and vegetables. Eating at least 5 servings of fruits and vegetables a day. Try to fill one-half of your plate with fruits and vegetables at each meal. Choosing lean protein foods, such as lean cuts of meat, poultry without skin, fish, tofu, beans, and nuts. Eating low-fat dairy products. Avoiding foods that are high in sodium. This can help lower blood pressure. Avoiding foods that have saturated fat, trans fat, and cholesterol. This can help prevent high cholesterol. Avoiding processed and prepared foods. Counting your daily carbohydrate intake.  Lifestyle If you drink alcohol: Limit how much you have to: 0-1 drink a day for women who are not pregnant. 0-2 drinks a day for men. Know how much alcohol is in your drink. In the [place] one drink equals one 12 oz bottle of beer ( ), one 5 oz glass of wine ( ), or one 1 oz glass of hard liquor (44mL). Do not use any products that contain nicotine or tobacco. These products include cigarettes, chewing tobacco, and vaping devices, such as e-cigarettes. If you need help quitting, ask your health care provider. Avoid secondhand smoke. Do not use drugs. Activity  Try to stay at a healthy weight. Get at least 30 minutes of exercise on most days, such as: Fast  walking. Biking. Swimming. Medicines Take over-the-counter and prescription medicines only as told by your health care provider. Aspirin or blood thinners (antiplatelets or anticoagulants) may be recommended to reduce your risk of forming blood clots that can lead to stroke. Avoid taking birth control pills. Talk to your health care provider about the risks of taking birth control pills if: You are over 83 years old. You smoke. You get very bad headaches. You have had a blood clot. Where to find more information American Stroke Association: www.strokeassociation.org Get help right away if: You or a loved one has any symptoms of a stroke. "BE FAST" is an easy way to remember the main warning signs of a stroke: B - Balance. Signs are dizziness, sudden trouble walking, or loss of balance. E - Eyes. Signs are trouble seeing or a sudden change in vision. F - Face. Signs are sudden weakness or numbness of the face, or the face or eyelid drooping on one side. A - Arms. Signs are weakness or numbness in an arm. This happens suddenly and usually on one side of the body. S - Speech. Signs are sudden trouble speaking, slurred speech, or trouble understanding what people say. T - Time. Time to call emergency services. Write down what time symptoms started. You or a loved one has other signs of a stroke, such as: A sudden, severe headache with no known cause. Nausea or vomiting. Seizure. These symptoms may represent a serious problem that is an emergency. Do not wait to see if the symptoms will go away. Get medical help right away. Call your local  emergency services (911 in the [place] Do not drive yourself to the hospital. Summary You can help to prevent a stroke by eating healthy, exercising, not smoking, limiting alcohol intake, and managing any medical conditions you may have. Do not use any products that contain nicotine or tobacco. These include cigarettes, chewing tobacco, and vaping devices,  such as e-cigarettes. If you need help quitting, ask your health care provider. Remember "BE FAST" for warning signs of a stroke. Get help right away if you or a loved one has any of these signs. This information is not intended to replace advice given to you by your health care provider. Make sure you discuss any questions you have with your health care provider. Document Revised: 09/01/2022 Document Reviewed: 09/01/2022 Elsevier Patient Education  2024 [employer].

## 2023-06-02 NOTE — Progress Notes (Signed)
[facility] [address] [place]. [place] [address] [phone]       OFFICE CONSULT NOTE  [name] Date of Birth:  [birth year] Medical Record Number:  [id number]   Referring MD: Eustaquio Boyden, DO  Reason for Referral: Stroke  HPI: Logan Wright is a 77 year old Caucasian male seen today for initial office consultation visit for stroke.  History is obtained from the patient and review of referral notes and electronic medical records and I personally reviewed pertinent available imaging films in PACS.  He has past medical history for paroxysmal A-fib on Eliquis since 2022, hypertension, hyperlipidemia, prediabetes, abdominal aortic aneurysm, right breast cancer status postmastectomy in June 2021, left parotid mass, bilateral hip and left knee replacement.  Patient was on a cruise around [place] when on 04/26/2023 he developed sudden onset of tingling involving his left cheek as well as left facial droop.  This was noticed by his wife noticed sagging of his face.  This lasted barely 30 seconds and resolved.  He mentioned this to the physician on board the cruise and he was given a choice of either being air evacuated to react awake or start taking aspirin and stay on the cruise and he chose to continue the cruise.  He did find rest of the cruise and after returning back to [place] on 2 weeks later on 05/05/2023 he had another similar episode which also lasted less than 30 seconds and MRI on 05/02/2023 showed no acute abnormality.Marland Kitchen  He underwent an MRI scan of the brain ordered by his primary physician on 04/28/2022 which showed a small right parietal white matter early subacute lacunar infarct as well as old lacunar infarcts in right frontal white matter and bilateral centrum semiovale and moderate changes of chronic small vessel disease.  A small incidental left cavernous ICA aneurysm was noted.  CT angiogram suggested high-grade stenosis of proximal right carotid close to  its origin.  Patient was referred to vascular surgery and underwent interval right carotid revascularization with the TCAR procedure by Dr. Lenell Antu on 05/14/2023 uneventfully.  He has done well since then without recurrent TIA or stroke symptoms.  His lab work on 05/06/2023 had shown LDL cholesterol to be 76 mg percent and hemoglobin A1c 5.5.  Echocardiogram had shown ejection fraction of 55 to 60%.  Patient has history of paroxysmal A-fib and has been on Eliquis for the last several years and tolerated well without any side effects.  Patient is quite active and exercises a lot.  He is having only minor bruising and no bleeding on Eliquis.  He states his blood pressure is under good control.  He is tolerating Lipitor well without muscle aches and pains.  He has no new complaints today.  ROS:   14 system review of systems is positive for numbness, facial droop, bruising all other systems negative  PMH:  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   Echocardiogram 07/2020: 42 mm   Breast cancer (HCC) 2021   right breast   Breast cancer, right (HCC)    CKD (chronic kidney disease), stage III (HCC)    Colon adenoma 2015   Coronary artery calcification seen on CT scan    Diverticulosis    DENIES    DJD (degenerative joint disease)    Dysrhythmia    A-fib   ED (erectile dysfunction)    Elevated blood pressure 2007-2008   Esophageal reflux  GERD (gastroesophageal reflux disease)    Gout    Gynecomastia    LEFT   Hearing loss    HTN (hypertension)    Hypercholesteremia    Non-cardiac chest pain    OA (osteoarthritis)    LEFT SHOULDER   Onychomycosis of foot with other complication    PAF (paroxysmal atrial fibrillation) (HCC)    Pre-diabetes    Seborrhea    Severe frontal headaches    RESOLVED    Vertigo    RESOLVED    Wears glasses    Wears hearing aid     Social History:  Social History   Socioeconomic  History   Marital status: Married    Spouse name: Not on file   Number of children: Not on file   Years of education: Not on file   Highest education level: Not on file  Occupational History   Not on file  Tobacco Use   Smoking status: Never   Smokeless tobacco: Never  Vaping Use   Vaping status: Never Used  Substance and Sexual Activity   Alcohol use: No    Alcohol/week: 0.0 standard drinks of alcohol   Drug use: No   Sexual activity: Not on file  Other Topics Concern   Not on file  Social History Narrative   Not on file   Social Determinants of Health   Financial Resource Strain: Not on file  Food Insecurity: No Food Insecurity (05/05/2023)   Hunger Vital Sign    Worried About Running Out of Food in the Last Year: Never true    Ran Out of Food in the Last Year: Never true  Transportation Needs: No Transportation Needs (05/05/2023)   PRAPARE - Administrator, Civil Service (Medical): No    Lack of Transportation (Non-Medical): No  Physical Activity: Not on file  Stress: Not on file  Social Connections: Not on file  Intimate Partner Violence: Not At Risk (05/05/2023)   Humiliation, Afraid, Rape, and Kick questionnaire    Fear of Current or Ex-Partner: No    Emotionally Abused: No    Physically Abused: No    Sexually Abused: No    Medications:   Current Outpatient Medications on File Prior to Visit  Medication Sig Dispense Refill   allopurinol (ZYLOPRIM) 100 MG tablet Take 200 mg by mouth daily.     apixaban (ELIQUIS) 5 MG TABS tablet Take 1 tablet (5 mg total) by mouth 2 (two) times daily. Hold this medication 48 prior to vascular surgery. 180 tablet 1   aspirin EC 81 MG tablet Take 81 mg by mouth daily. Swallow whole.     atorvastatin (LIPITOR) 80 MG tablet Take 80 mg by mouth at bedtime.     clopidogrel (PLAVIX) 75 MG tablet Take 1 tablet (75 mg total) by mouth daily. 30 tablet 5   GLUCOSAMINE-CHONDROITIN PO Take 2 tablets by mouth daily.     Multiple  Vitamin (MULTIVITAMIN) tablet Take 1 tablet by mouth daily.      Omega-3 Fatty Acids (FISH OIL ULTRA) 1400 MG CAPS Take 1,400-2,800 mg by mouth See admin instructions. Take 1400 mg in the morning and 2800 mg in the evening     Potassium 99 MG TABS Take 1 tablet by mouth daily.     tamoxifen (NOLVADEX) 20 MG tablet Take 1 tablet (20 mg total) by mouth daily. 90 tablet 3   oxyCODONE (OXY IR/ROXICODONE) 5 MG immediate release tablet Take 1 tablet (5 mg total) by mouth every  4 (four) hours as needed for moderate pain. 10 tablet 0   No current facility-administered medications on file prior to visit.    Allergies:  No Known Allergies  Physical Exam General: well developed, well nourished, pleasant middle-age Caucasian male seated, in no evident distress Head: head normocephalic and atraumatic.   Neck: supple with no carotid or supraclavicular bruits.  Healed surgical scar from TCAR procedure in the right neck. Cardiovascular: regular rate and rhythm, no murmurs Musculoskeletal: no deformity Skin:  no rash/petichiae Vascular:  Normal pulses all extremities  Neurologic Exam Mental Status: Awake and fully alert. Oriented to place and time. Recent and remote memory intact. Attention span, concentration and fund of knowledge appropriate. Mood and affect appropriate.  Cranial Nerves: Fundoscopic exam reveals sharp disc margins. Pupils equal, briskly reactive to light. Extraocular movements full without nystagmus. Visual fields full to confrontation. Hearing intact. Facial sensation intact. Face, tongue, palate moves normally and symmetrically.  Motor: Normal bulk and tone. Normal strength in all tested extremity muscles. Sensory.: intact to touch , pinprick , position and vibratory sensation.  Coordination: Rapid alternating movements normal in all extremities. Finger-to-nose and heel-to-shin performed accurately bilaterally. Gait and Station: Arises from chair without difficulty. Stance is normal.  Gait demonstrates normal stride length and balance . Able to heel, toe and tandem walk with moderate difficulty.  Reflexes: 1+ and symmetric. Toes downgoing.   NIHSS  0 Modified Rankin  0   ASSESSMENT: 77 year old Caucasian male with 2 transient episodes of left face numbness and weakness possibly right hemispheric TIAs from symptomatic high-grade proximal right carotid stenosis who is doing well following interval right carotid revascularization.  Vascular risk factors of paroxysmal A-fib, carotid stenosis, hyperlipidemia.     PLAN:I had a long d/w patient about his recent stroke, TIA , PAF,and carotid stenosis,, risk for recurrent stroke/TIAs, personally independently reviewed imaging studies and stroke evaluation results and answered questions.Continue Eliquis (apixaban) daily  for secondary stroke prevention and maintain strict control of hypertension with blood pressure goal below 130/90, diabetes with hemoglobin A1c goal below 6.5% and lipids with LDL cholesterol goal below 70 mg/dL. I also advised the patient to eat a healthy diet with plenty of whole grains, cereals, fruits and vegetables, exercise regularly and maintain ideal body weight .patient is neurologically cleared to undergo dental cleaning as long as patient does not have to stop his antiplatelets or Eliquis for the procedure.  He may also consider possible participation in the [place] atrial fibrillation trial if interested and will be be given written information to review at home and decide.  Followup in the future with me in 6 months or call earlier if necessary Greater than 50% time during this 45-minute consultation visit was spent on counseling and coordination of care about his TIAs and symptomatic carotid stenosis and A-fib and answering questions Delia Heady, MD Note: This document was prepared with digital dictation and possible smart phrase technology. Any transcriptional errors that result from this process are  unintentional.

## 2023-06-04 ENCOUNTER — Other Ambulatory Visit: Payer: Self-pay

## 2023-06-04 DIAGNOSIS — I6521 Occlusion and stenosis of right carotid artery: Secondary | ICD-10-CM

## 2023-06-11 ENCOUNTER — Encounter: Payer: Self-pay | Admitting: Cardiovascular Disease

## 2023-06-12 ENCOUNTER — Ambulatory Visit (HOSPITAL_COMMUNITY)
Admission: RE | Admit: 2023-06-12 | Discharge: 2023-06-12 | Disposition: A | Discharge status: Home / Self Care | Payer: PPO | Source: Ambulatory Visit | Attending: Vascular Surgery | Admitting: Vascular Surgery

## 2023-06-12 DIAGNOSIS — I6521 Occlusion and stenosis of right carotid artery: Secondary | ICD-10-CM | POA: Diagnosis not present

## 2023-06-15 NOTE — Progress Notes (Unsigned)
[facility] [place]  ASSESSMENT / PLAN: 77 y.o. male with *** - ***  CHIEF COMPLAINT: ***  HISTORY OF PRESENT ILLNESS: Logan MARINELARENA is a 76 y.o. male  who presented to the hospital with slurred speech, left facial droop, which lasted about 15 seconds and resolved.  He had similar sx on a cruise in [place] and he states it resolved as quickly as he could tell his wife.  He followed up with his PCP and an MRI of the brain was ordered, which revealed a small acute to early subacute infarct in the right parietal sub-cortical white matter.   He did have a CTA of the head/neck on 05/05/2023, which revealed ~ 80% stenosis of the proximal right cervical ICA.  He did not have any other sx.   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    Pt has hx of afib and on Eliquis.  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  hx of CAD, HLD, HTN and breast cancer.   He has hx of elevated creatinine in the past but creatinine today is 1.18.  He states that after his mastectomy, he did have some afib and on Eliquis.  He checks his heart rate and he has not seen any further afib.  He has cramping in at night but does not endorse claudication.  He and his wife of 55 years are very active and work out regularly.     He states that his parents have hx of CAD and stroke but lived to be in late 60's and 100.    The pt is on a statin for cholesterol management.  The pt is on a daily aspirin.   Other AC:  Eliquis The pt is on CCB for hypertension.   The pt is not on medication for diabetes PTA. Tobacco hx:  never  He underwent R TCAR 05/14/23. He tolerated this well. He was discharged POD#2 because of hypotension.   06/15/23: ***  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   Echocardiogram 07/2020: 42 mm    Breast cancer (HCC) 2021   right breast   Breast cancer, right (HCC)    CKD (chronic kidney disease), stage III (HCC)    Colon adenoma 2015   Coronary artery calcification seen on CT scan    Diverticulosis    DENIES    DJD (degenerative joint disease)    Dysrhythmia    A-fib   ED (erectile dysfunction)    Elevated blood pressure 2007-2008   Esophageal reflux    GERD (gastroesophageal reflux disease)    Gout    Gynecomastia    LEFT   Hearing loss    HTN (hypertension)    Hypercholesteremia    Non-cardiac chest pain    OA (osteoarthritis)    LEFT SHOULDER   Onychomycosis of foot with other complication    PAF (paroxysmal atrial fibrillation) (HCC)    Pre-diabetes    Seborrhea    Severe frontal headaches    RESOLVED    Vertigo    RESOLVED    Wears glasses    Wears hearing aid     Past Surgical History:  Procedure Laterality Date   COLONSCOPY      HIP ARTHROPLASTY Bilateral    AT [place]  IR CV LINE INJECTION  05/14/2020   IR IMAGING GUIDED PORT INSERTION  05/17/2020   IR REMOVAL TUN ACCESS W/ PORT W/O FL MOD SED  05/17/2020   KNEE ARTHROSCOPY     UNSURE WHICH KNEE    MASTECTOMY Right 03/16/2020   MASTECTOMY W/ SENTINEL NODE BIOPSY Right 03/16/2020   Procedure: RIGHT BREAST MASTECTOMY WITH SENTINEL LYMPH NODE BIOPSY;  Surgeon: Abigail Miyamoto, MD;  Location:  SURGERY CENTER;  Service: General;  Laterality: Right;   PORTACATH PLACEMENT Left 05/08/2020   Procedure: INSERTION PORT-A-CATH WITH ULTRASOUND;  Surgeon: Abigail Miyamoto, MD;  Location: [facility] OR;  Service: General;  Laterality: Left;  LMA   TOTAL KNEE ARTHROPLASTY Left 11/23/2018   Procedure: TOTAL KNEE ARTHROPLASTY;  Surgeon: Durene Romans, MD;  Location: [facility] ORS;  Service: Orthopedics;  Laterality: Left;  70 mins   TRANSCAROTID ARTERY REVASCULARIZATION  Right 05/14/2023   Procedure: Transcarotid Artery Revascularization;  Surgeon: Leonie Douglas, MD;  Location: [facility] OR;  Service: Vascular;   Laterality: Right;   ULTRASOUND GUIDANCE FOR VASCULAR ACCESS Left 05/14/2023   Procedure: ULTRASOUND GUIDANCE FOR VASCULAR ACCESS;  Surgeon: Leonie Douglas, MD;  Location: [facility] OR;  Service: Vascular;  Laterality: Left;    Family History  Problem Relation Age of Onset   Heart attack Mother    Stroke Mother    Heart failure Father        59   Hypertension Neg Hx     Social History   Socioeconomic History   Marital status: Married    Spouse name: Not on file   Number of children: Not on file   Years of education: Not on file   Highest education level: Not on file  Occupational History   Not on file  Tobacco Use   Smoking status: Never   Smokeless tobacco: Never  Vaping Use   Vaping status: Never Used  Substance and Sexual Activity   Alcohol use: No    Alcohol/week: 0.0 standard drinks of alcohol   Drug use: No   Sexual activity: Not on file  Other Topics Concern   Not on file  Social History Narrative   Not on file   Social Determinants of Health   Financial Resource Strain: Not on file  Food Insecurity: No Food Insecurity (05/05/2023)   Hunger Vital Sign    Worried About Running Out of Food in the Last Year: Never true    Ran Out of Food in the Last Year: Never true  Transportation Needs: No Transportation Needs (05/05/2023)   PRAPARE - Administrator, Civil Service (Medical): No    Lack of Transportation (Non-Medical): No  Physical Activity: Not on file  Stress: Not on file  Social Connections: Not on file  Intimate Partner Violence: Not At Risk (05/05/2023)   Humiliation, Afraid, Rape, and Kick questionnaire    Fear of Current or Ex-Partner: No    Emotionally Abused: No    Physically Abused: No    Sexually Abused: No    No Known Allergies  Current Outpatient Medications  Medication Sig Dispense Refill   allopurinol (ZYLOPRIM) 100 MG tablet Take 200 mg by mouth daily.     apixaban (ELIQUIS) 5 MG TABS tablet Take 1 tablet (5 mg total) by mouth 2  (two) times daily. Hold this medication 48 prior to vascular surgery. 180 tablet 1   aspirin EC 81 MG tablet Take 81 mg by mouth daily. Swallow whole.     atorvastatin (LIPITOR) 80  MG tablet Take 80 mg by mouth at bedtime.     clopidogrel (PLAVIX) 75 MG tablet Take 1 tablet (75 mg total) by mouth daily. 30 tablet 5   GLUCOSAMINE-CHONDROITIN PO Take 2 tablets by mouth daily.     Multiple Vitamin (MULTIVITAMIN) tablet Take 1 tablet by mouth daily.      Omega-3 Fatty Acids (FISH OIL ULTRA) 1400 MG CAPS Take 1,400-2,800 mg by mouth See admin instructions. Take 1400 mg in the morning and 2800 mg in the evening     oxyCODONE (OXY IR/ROXICODONE) 5 MG immediate release tablet Take 1 tablet (5 mg total) by mouth every 4 (four) hours as needed for moderate pain. 10 tablet 0   Potassium 99 MG TABS Take 1 tablet by mouth daily.     tamoxifen (NOLVADEX) 20 MG tablet Take 1 tablet (20 mg total) by mouth daily. 90 tablet 3   No current facility-administered medications for this visit.    PHYSICAL EXAM There were no vitals filed for this visit.  Constitutional: *** appearing. *** distress. Appears *** nourished.  Neurologic: CN ***. *** focal findings. *** sensory loss. Psychiatric: *** Mood and affect symmetric and appropriate. Eyes: *** No icterus. No conjunctival pallor. Ears, nose, throat: *** mucous membranes moist. Midline trachea.  Cardiac: *** rate and rhythm.  Respiratory: *** unlabored. Abdominal: *** soft, non-tender, non-distended.  Peripheral vascular: *** Extremity: *** edema. *** cyanosis. *** pallor.  Skin: *** gangrene. *** ulceration.  Lymphatic: *** Stemmer's sign. *** palpable lymphadenopathy.    PERTINENT LABORATORY AND RADIOLOGIC DATA  Most recent CBC    Latest Ref Rng & Units 05/15/2023    4:40 AM 05/14/2023    9:23 AM 05/06/2023   12:47 AM  CBC  WBC 4.0 - 10.5 K/uL 8.7  8.6  8.2   Hemoglobin 13.0 - 17.0 g/dL 84.6  96.2  95.2   Hematocrit 39.0 - 52.0 % 32.2  42.5  38.8    Platelets 150 - 400 K/uL 174  225  207      Most recent CMP    Latest Ref Rng & Units 05/15/2023    4:40 AM 05/14/2023    9:23 AM 05/06/2023   12:47 AM  CMP  Glucose 70 - 99 mg/dL 96  91  96   BUN 8 - 23 mg/dL 21  26  19    Creatinine 0.61 - 1.24 mg/dL 8.41  3.24  4.01   Sodium 135 - 145 mmol/L 137  136  139   Potassium 3.5 - 5.1 mmol/L 3.9  3.7  3.9   Chloride 98 - 111 mmol/L 107  103  107   CO2 22 - 32 mmol/L 21  21  26    Calcium 8.9 - 10.3 mg/dL 7.7  8.6  8.5   Total Protein 6.5 - 8.1 g/dL  6.1    Total Bilirubin 0.3 - 1.2 mg/dL  0.8    Alkaline Phos 38 - 126 U/L  70    AST 15 - 41 U/L  30    ALT 0 - 44 U/L  28      Renal function CrCl cannot be calculated (Patient's most recent lab result is older than the maximum 21 days allowed.).  Hgb A1c MFr Bld (%)  Date Value  05/06/2023 5.5    LDL Cholesterol  Date Value Ref Range Status  05/06/2023 76 0 - 99 mg/dL Final    Comment:           Total Cholesterol/HDL:CHD Risk Coronary  Heart Disease Risk Table                     Men   Women  1/2 Average Risk   3.4   3.3  Average Risk       5.0   4.4  2 X Average Risk   9.6   7.1  3 X Average Risk  23.4   11.0        Use the calculated Patient Ratio above and the CHD Risk Table to determine the patient's CHD Risk.        ATP III CLASSIFICATION (LDL):  <100     mg/dL   Optimal  161-096  mg/dL   Near or Above                    Optimal  130-159  mg/dL   Borderline  045-409  mg/dL   High  >811     mg/dL   Very High Performed at [facility], [address], [place], [place] [address]      Vascular Imaging:  Right Carotid: There is no evidence of stenosis in the right ICA.   Left Carotid: Velocities in the left ICA are consistent with a 1-39%  stenosis.   Vertebrals: Bilateral vertebral arteries demonstrate antegrade flow.  Subclavians: Normal flow hemodynamics were seen in bilateral subclavian               arteries.    Rande Brunt. Lenell Antu, MD FACS [facility] Phone Number: [phone] 06/15/2023 2:22 PM   Total time spent on preparing this encounter including chart review, data review, collecting history, examining the patient, coordinating care for this {[id number]}  Portions of this report may have been transcribed using voice recognition software.  Every effort has been made to ensure accuracy; however, inadvertent computerized transcription errors may still be present.

## 2023-06-15 NOTE — Progress Notes (Addendum)
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   Echocardiogram 07/2020: 42 mm   Breast cancer (HCC) 2021   right breast   Breast cancer, right (HCC)    CKD (chronic kidney disease), stage III (HCC)    Colon adenoma 2015   Coronary artery calcification seen on CT scan    Diverticulosis    DENIES    DJD (degenerative joint disease)    Dysrhythmia    A-fib   ED (erectile dysfunction)    Elevated blood pressure 2007-2008   Esophageal reflux    GERD (gastroesophageal reflux disease)    Gout    Gynecomastia    LEFT   Hearing loss    HTN (hypertension)    Hypercholesteremia    Non-cardiac chest pain    OA (osteoarthritis)    LEFT SHOULDER   Onychomycosis of foot with other complication    PAF (paroxysmal atrial fibrillation) (HCC)    Pre-diabetes    Seborrhea    Severe frontal headaches    RESOLVED    Vertigo    RESOLVED    Wears glasses    Wears hearing aid     History of Present Illness  Logan Wright is a 77 y.o. male with a PMH of paroxysmal AF, CVA/TIA, carotid stenosis s/p right carotid stent 05/14/2023, HTN, HLD, CKD stage III right breast CA s/p right mastectomy, coronary calcifications, aortic dilation who presents today with complaint of weakness, fatigue and chest pain.  Logan Wright was seen initially by Dr. Elease Hashimoto for preoperative clearance.  He underwent a Lexiscan Myoview in 2020 that showed no evidence of ischemia and was normal.  He was admitted to the hospital in 07/2020 with complaint of shortness of breath and fatigue.  He was found to be in AF with RVR new onset and started on Eliquis with rate control completed.  2D echo was completed 07/2020 with EF of  55-60% and mild concentric LVH with moderately dilated LA and trivial MVR aortic dilation measuring 42 mm.  There was plan for outpatient DCCV but patient converted with Cardizem for rate control.  He was admitted to the hospital on 05/05/2023 slurred speech and left facial droop and found to have TIA.  He underwent MRI showed a small acute infarct in the right parietal subcortical white matter.  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  He was placed on ASA and Plavix in addition to Eliquis.  He underwent right carotid revascularization by Dr. Lenell Antu on 05/14/2023.  He contacted our office on 05/18/2023 heart rate.  He was seen by Dr. Elease Hashimoto on 05/22/2023 and BP was controlled with no recent episodes of AF.  He was suspected of having high vagal tone and was advised to continue to not continue Cardizem.  He contacted our office on 06/11/2023 with complaints of feeling lethargic with very minor chest pain and minor forehead pain  During today's visit the patient reports they are feeling better but still reporting episodes of slight forehead pain and fatigue.  His blood pressure today is controlled at 138/84  and heart rate was 67 bpm.  He denies any episodes of atrial fibrillation and has a cardia monitor at home to verify.  He reports 2 episodes of presyncope that occurred once while driving and once at the gym.  He did not experience any syncope or LOC.  Patient denies chest pain, palpitations, dyspnea, PND, orthopnea, nausea, vomiting, dizziness, syncope, edema, weight gain, or early satiety.   Review of Systems  Please see the history of present illness.    All other systems reviewed and are otherwise negative except as noted above.  Physical Exam    Wt Readings from Last 3 Encounters:  06/02/23 174 lb (78.9 kg)  05/22/23 177 lb (80.3 kg)  05/14/23 170 lb (77.1 kg)    ZO:XWRUE were no vitals filed for this visit.,There is no height or weight on file to calculate BMI. GEN: Well nourished, well developed in no acute distress Neck: No JVD; No carotid bruits Pulmonary: Clear to auscultation without rales, wheezing or rhonchi  Cardiovascular: Normal rate. Regular rhythm. Normal S1. Normal S2.   Murmurs: There is no murmur.  ABDOMEN: Soft, non-tender, non-distended EXTREMITIES:  No edema; No deformity   EKG/LABS/ Recent Cardiac Studies   ECG personally reviewed by me today -none completed today  Risk Assessment/Calculations:    CHA2DS2-VASc Score = 4   This indicates a 4.8% annual risk of stroke. The patient's score is based upon: CHF History: 0 HTN History: 1 Diabetes History: 0 Stroke History: 0 Vascular Disease History: 1 Age Score: 2 Gender Score: 0         Lab Results  Component Value Date   WBC 8.7 05/15/2023   HGB 10.6 (L) 05/15/2023   HCT 32.2 (L) 05/15/2023   MCV 86.6 05/15/2023   PLT 174 05/15/2023   Lab Results  Component Value Date   CREATININE 1.27 (H) 05/15/2023   BUN 21 05/15/2023   NA 137 05/15/2023   K 3.9 05/15/2023   CL 107 05/15/2023   CO2 21 (L) 05/15/2023   Lab Results  Component Value Date   CHOL 130 05/06/2023   HDL 36 (L) 05/06/2023   LDLCALC 76 05/06/2023   TRIG 88 05/06/2023   CHOLHDL 3.6 05/06/2023    Lab Results  Component Value Date   HGBA1C 5.5 05/06/2023   Assessment & Plan    1.  Chest pain and lethargy: -Patient reports no chest discomfort but continues to have lethargy and notes 2 episodes of presyncope. -We will have patient wear ZIO monitor for 14 days to rule out possible arrhythmia related to presyncope.  2.  Paroxysmal AF: -Diagnosed in 2022  Cardizem stopped previously due to fogginess today reports improvement and heart rate well-controlled at 67 bpm. -Patient is currently in sinus rhythm -Most recent creatinine was 1.2 and hemoglobin was 10.6 -Continue Eliquis 5 mg twice  daily  3.  Essential hypertension: -Patient's blood pressure today was controlled at 138/84 -Patient will continue lifestyle modification and monitor BP over the next 2 weeks.   4.  History of carotid stenosis/CVA: -Subacute lacunar infarct -s/p right carotid stent due to carotid stenosis placed on 05/14/2023 by Dr. Lenell Antu -Continue BP 130/90 or less -Continue Plavix 75 mg and ASA 81 mg  5.  Ascending aortic dilation/coronary calcifications: -Continue current GDMT with Plavix 75 mg and ASA 81 mg -Continue BP control with goal of <130/90 .  6.  Preoperative clearance: -Patient's RCRI score is 0.9% -He was able to complete greater than 4 METS of activity -ZIO  monitor completed showing predominantly sinus rhythm with 1 run of VT and 9 beats of nonsustained SVT with no evidence of atrial fibrillation or malignant arrhythmias. -Patient is fine to proceed with scheduled procedure at this time and requires no further cardiac workup  Disposition: Follow-up with Kristeen Miss, MD or APP in 6 months  Signed, Napoleon Form, Leodis Rains, NP 06/15/2023, 2:26 PM [facility]

## 2023-06-16 ENCOUNTER — Ambulatory Visit (INDEPENDENT_AMBULATORY_CARE_PROVIDER_SITE_OTHER): Payer: PPO | Admitting: Vascular Surgery

## 2023-06-16 ENCOUNTER — Ambulatory Visit: Payer: PPO | Attending: Nurse Practitioner | Admitting: Nurse Practitioner

## 2023-06-16 ENCOUNTER — Encounter: Payer: Self-pay | Admitting: Nurse Practitioner

## 2023-06-16 ENCOUNTER — Encounter: Payer: Self-pay | Admitting: Vascular Surgery

## 2023-06-16 ENCOUNTER — Ambulatory Visit (INDEPENDENT_AMBULATORY_CARE_PROVIDER_SITE_OTHER): Payer: PPO

## 2023-06-16 VITALS — BP 117/70 | HR 66 | Temp 98.4°F | Resp 20 | Ht 67.0 in | Wt 176.0 lb

## 2023-06-16 VITALS — BP 138/84 | HR 67 | Ht 67.0 in | Wt 176.4 lb

## 2023-06-16 DIAGNOSIS — R5383 Other fatigue: Secondary | ICD-10-CM

## 2023-06-16 DIAGNOSIS — I2510 Atherosclerotic heart disease of native coronary artery without angina pectoris: Secondary | ICD-10-CM

## 2023-06-16 DIAGNOSIS — I10 Essential (primary) hypertension: Secondary | ICD-10-CM | POA: Diagnosis not present

## 2023-06-16 DIAGNOSIS — R0789 Other chest pain: Secondary | ICD-10-CM | POA: Diagnosis not present

## 2023-06-16 DIAGNOSIS — I480 Paroxysmal atrial fibrillation: Secondary | ICD-10-CM

## 2023-06-16 DIAGNOSIS — I6521 Occlusion and stenosis of right carotid artery: Secondary | ICD-10-CM

## 2023-06-16 DIAGNOSIS — I77810 Thoracic aortic ectasia: Secondary | ICD-10-CM

## 2023-06-16 DIAGNOSIS — G459 Transient cerebral ischemic attack, unspecified: Secondary | ICD-10-CM

## 2023-06-16 DIAGNOSIS — Z95828 Presence of other vascular implants and grafts: Secondary | ICD-10-CM

## 2023-06-16 NOTE — Progress Notes (Unsigned)
ZIO XT serial # [id number] from office inventory applied to patient.  Dr. Elease Hashimoto to read.

## 2023-06-16 NOTE — Patient Instructions (Addendum)
Medication Instructions:  Your physician recommends that you continue on your current medications as directed. Please refer to the Current Medication list given to you today. *If you need a refill on your cardiac medications before your next appointment, please call your pharmacy*   Lab Work: None ordered   Testing/Procedures: ZIO XT- Long Term Monitor Instructions  Your physician has requested you wear a ZIO patch monitor for 14 days.  This is a single patch monitor. [employer] supplies one patch monitor per enrollment. Additional stickers are not available. Please do not apply patch if you will be having a Nuclear Stress Test,  Echocardiogram, Cardiac CT, MRI, or Chest Xray during the period you would be wearing the  monitor. The patch cannot be worn during these tests. You cannot remove and re-apply the  ZIO XT patch monitor.  Your ZIO patch monitor will be mailed 3 day USPS to your address on file. It may take 3-5 days  to receive your monitor after you have been enrolled.  Once you have received your monitor, please review the enclosed instructions. Your monitor  has already been registered assigning a specific monitor serial # to you.  Billing and Patient Assistance Program Information  We have supplied [employer] with any of your insurance information on file for billing purposes. [employer] offers a sliding scale Patient Assistance Program for patients that do not have  insurance, or whose insurance does not completely cover the cost of the ZIO monitor.  You must apply for the Patient Assistance Program to qualify for this discounted rate.  To apply, please call [employer] at [phone], select option 4, select option 2, ask to apply for  Patient Assistance Program. Meredeth Ide will ask your household income, and how many people  are in your household. They will quote your out-of-pocket cost based on that information.  [employer] will also be able to set up a 67-month, interest-free payment  plan if needed.  Applying the monitor   Shave hair from upper left chest.  Hold abrader disc by orange tab. Rub abrader in 40 strokes over the upper left chest as  indicated in your monitor instructions.  Clean area with 4 enclosed alcohol pads. Let dry.  Apply patch as indicated in monitor instructions. Patch will be placed under collarbone on left  side of chest with arrow pointing upward.  Rub patch adhesive wings for 2 minutes. Remove white label marked "1". Remove the white  label marked "2". Rub patch adhesive wings for 2 additional minutes.  While looking in a mirror, press and release button in center of patch. A small green light will  flash 3-4 times. This will be your only indicator that the monitor has been turned on.  Do not shower for the first 24 hours. You may shower after the first 24 hours.  Press the button if you feel a symptom. You will hear a small click. Record Date, Time and  Symptom in the Patient Logbook.  When you are ready to remove the patch, follow instructions on the last 2 pages of Patient  Logbook. Stick patch monitor onto the last page of Patient Logbook.  Place Patient Logbook in the blue and white box. Use locking tab on box and tape box closed  securely. The blue and white box has prepaid postage on it. Please place it in the mailbox as  soon as possible. Your physician should have your test results approximately 7 days after the  monitor has been mailed back to [facility].  Call [facility] Customer Care at [phone] if you have questions regarding  your ZIO XT patch monitor. Call them immediately if you see an orange light blinking on your  monitor.  If your monitor falls off in less than 4 days, contact our Monitor department at [phone].  If your monitor becomes loose or falls off after 4 days call [employer] at [phone] for  suggestions on securing your monitor   Follow-Up: At [facility], you and your health needs  are our priority.  As part of our continuing mission to provide you with exceptional heart care, we have created designated Provider Care Teams.  These Care Teams include your primary Cardiologist (physician) and Advanced Practice Providers (APPs -  Physician Assistants and Nurse Practitioners) who all work together to provide you with the care you need, when you need it.  We recommend signing up for the patient portal called "MyChart".  Sign up information is provided on this After Visit Summary.  MyChart is used to connect with patients for Virtual Visits (Telemedicine).  Patients are able to view lab/test results, encounter notes, upcoming appointments, etc.  Non-urgent messages can be sent to your provider as well.   To learn more about what you can do with MyChart, go to [email or web address].    Your next appointment:   6 month(s)  Provider:   Kristeen Miss, MD  or Robin Searing, NP   Other Instructions  Check your blood pressure daily for 2 weeks, then contact the office with your readings.  Make sure to check 2 hours after your medications.   AVOID these things for 30 minutes before checking your blood pressure: No Drinking caffeine. No Drinking alcohol. No Eating. No Smoking. No Exercising.  Five minutes before checking your blood pressure: Pee. Sit in a dining chair. Avoid sitting in a soft couch or armchair. Be quiet. Do not talk.

## 2023-06-22 ENCOUNTER — Encounter (INDEPENDENT_AMBULATORY_CARE_PROVIDER_SITE_OTHER): Payer: Self-pay | Admitting: Otolaryngology

## 2023-06-22 ENCOUNTER — Telehealth: Payer: Self-pay | Admitting: *Deleted

## 2023-06-22 ENCOUNTER — Encounter: Payer: Self-pay | Admitting: Cardiovascular Disease

## 2023-06-22 ENCOUNTER — Ambulatory Visit (INDEPENDENT_AMBULATORY_CARE_PROVIDER_SITE_OTHER): Payer: PPO | Admitting: Otolaryngology

## 2023-06-22 VITALS — BP 162/94 | HR 70 | Ht 67.0 in | Wt 178.8 lb

## 2023-06-22 DIAGNOSIS — K118 Other diseases of salivary glands: Secondary | ICD-10-CM

## 2023-06-22 DIAGNOSIS — D37030 Neoplasm of uncertain behavior of the parotid salivary glands: Secondary | ICD-10-CM

## 2023-06-22 DIAGNOSIS — H1131 Conjunctival hemorrhage, right eye: Secondary | ICD-10-CM

## 2023-06-22 DIAGNOSIS — Z7901 Long term (current) use of anticoagulants: Secondary | ICD-10-CM

## 2023-06-22 DIAGNOSIS — Z8673 Personal history of transient ischemic attack (TIA), and cerebral infarction without residual deficits: Secondary | ICD-10-CM | POA: Diagnosis not present

## 2023-06-22 NOTE — Progress Notes (Signed)
Sterling Big, MD  Leodis Rains D; P [facility] Procedure Requests Approved for US guided FNA of left parotid nodule.  HKM

## 2023-06-22 NOTE — Telephone Encounter (Signed)
   Pre-operative Risk Assessment    Patient Name: Logan Wright  DOB: January 25, 1946 MRN: [id number]    DATE OF LAST VISIT: 06/16/23 Robin Searing, NP DATE OF NEXT VISIT: NONE  Request for Surgical Clearance    Procedure:   [place] FNA Bx OF A PAROTID GLAND  Date of Surgery:  Clearance 07/10/23                                 Surgeon:  NOT LISTED Surgeon's Group or Practice Name:   [facility] Phone number: NOT LISTED Fax number:  [phone] ATTN: TONYA CRAWFORD   Type of Clearance Requested:   - Medical  - Pharmacy:  Hold Aspirin and Apixaban (Eliquis)     Type of Anesthesia:   MODERATE SEDATION   Additional requests/questions:    Elpidio Anis   06/22/2023, 5:28 PM

## 2023-06-22 NOTE — Progress Notes (Signed)
ENT CONSULT:  Reason for Consult: parotid mass   HPI: Logan Wright is an 77 y.o. male with hx of a recent TIA x 2 in the setting of significant carotid artery stenosis who is here for evaluation of incidentally noted left parotid mass.  He had two TIAs and had imaging done in the ED, was found to have 80% occlusion of the right internal carotid artery which required surgery/stenting. He states he had no facial weakness with TIAs, but when he had his first TIA  in [place] on a cruise 04/19/23, he developed tingling and facial droop on the left side, then again when he came back 04/29/23 and was diagnosed with second TIA- he had tingling and facial droop - first one 30 sec, then 20-30 sec. He is on Eliquis for Afib + ASA 81 mg.  He reports history of right breast cancer treated with right sided mastectomy surgery + chemo (was previously also taking Plavix, but does not take it any longer).  He did not notice left parotid mass until was found on imaging, denies history of skin cancers in the past, denies history of smoking, feels he is back to normal from neurologic standpoint after TIAs and no longer has facial weakness numbness or tingling.   Records Reviewed:  Was admitted 05/14/2023 for right carotid artery revascularization due to severe carotid artery stenosis and history of TIA  Office notes from vascular surgery 06/16/2023 by Dr. Lenell Antu ASSESSMENT / PLAN: 77 y.o. male status post right TCAR 05/14/2023 for symptomatic carotid artery stenosis.  He has done very well since surgery.  He can discontinue Plavix at this time.  He should continue aspirin and Eliquis indefinitely.  He should continue high intensity statin therapy indefinitely.  I will see him again in 1 year with repeat carotid duplex    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   Echocardiogram 07/2020: 42 mm   Breast cancer (HCC) 2021   right breast   Breast  cancer, right (HCC)    CKD (chronic kidney disease), stage III (HCC)    Colon adenoma 2015   Coronary artery calcification seen on CT scan    Diverticulosis    DENIES    DJD (degenerative joint disease)    Dysrhythmia    A-fib   ED (erectile dysfunction)    Elevated blood pressure 2007-2008   Esophageal reflux    GERD (gastroesophageal reflux disease)    Gout    Gynecomastia    LEFT   Hearing loss    HTN (hypertension)    Hypercholesteremia    Non-cardiac chest pain    OA (osteoarthritis)    LEFT SHOULDER   Onychomycosis of foot with other complication    PAF (paroxysmal atrial fibrillation) (HCC)    Pre-diabetes    Seborrhea    Severe frontal headaches    RESOLVED    Vertigo    RESOLVED    Wears glasses    Wears hearing aid     Past Surgical History:  Procedure Laterality Date   COLONSCOPY      HIP ARTHROPLASTY Bilateral    AT [facility]    IR CV LINE INJECTION  05/14/2020   IR IMAGING GUIDED PORT INSERTION  05/17/2020   IR REMOVAL TUN ACCESS W/ PORT W/O FL MOD SED  05/17/2020   KNEE ARTHROSCOPY     UNSURE WHICH KNEE    MASTECTOMY Right 03/16/2020  MASTECTOMY W/ SENTINEL NODE BIOPSY Right 03/16/2020   Procedure: RIGHT BREAST MASTECTOMY WITH SENTINEL LYMPH NODE BIOPSY;  Surgeon: Abigail Miyamoto, MD;  Location: [facility];  Service: General;  Laterality: Right;   PORTACATH PLACEMENT Left 05/08/2020   Procedure: INSERTION PORT-A-CATH WITH ULTRASOUND;  Surgeon: Abigail Miyamoto, MD;  Location: [facility] OR;  Service: General;  Laterality: Left;  LMA   TOTAL KNEE ARTHROPLASTY Left 11/23/2018   Procedure: TOTAL KNEE ARTHROPLASTY;  Surgeon: Durene Romans, MD;  Location: [facility];  Service: Orthopedics;  Laterality: Left;  70 mins   TRANSCAROTID ARTERY REVASCULARIZATION  Right 05/14/2023   Procedure: Transcarotid Artery Revascularization;  Surgeon: Leonie Douglas, MD;  Location: [facility] OR;  Service: Vascular;  Laterality: Right;   ULTRASOUND GUIDANCE FOR VASCULAR  ACCESS Left 05/14/2023   Procedure: ULTRASOUND GUIDANCE FOR VASCULAR ACCESS;  Surgeon: Leonie Douglas, MD;  Location: [facility] OR;  Service: Vascular;  Laterality: Left;    Family History  Problem Relation Age of Onset   Heart attack Mother    Stroke Mother    Heart failure Father        64   Hypertension Neg Hx     Social History:  reports that he has never smoked. He has never used smokeless tobacco. He reports that he does not drink alcohol and does not use drugs.  Allergies: No Known Allergies  Medications: I have reviewed the patient's current medications.  The PMH, PSH, Medications, Allergies, and SH were reviewed and updated.  ROS: Constitutional: Negative for fever, weight loss and weight gain. Cardiovascular: Negative for chest pain and dyspnea on exertion. Respiratory: Is not experiencing shortness of breath at rest. Gastrointestinal: Negative for nausea and vomiting. Neurological: Negative for headaches. Psychiatric: The patient is not nervous/anxious  Blood pressure (!) 162/94, pulse 70, height 5\' 7"  (1.702 m), weight 178 lb 12.8 oz (81.1 kg), SpO2 96%.  PHYSICAL EXAM:  Exam: General: Well-developed, well-nourished Communication and Voice: Slightly raspy Respiratory Respiratory effort: Equal inspiration and expiration without stridor Cardiovascular Peripheral Vascular: Warm extremities with equal color/perfusion Eyes: No nystagmus with equal extraocular motion bilaterally, right conjunctival hemorrhage Neuro/Psych/Balance: Patient oriented to person, place, and time; Appropriate mood and affect; Gait is intact with no imbalance; Cranial nerves I-XII are intact Head and Face Inspection: Normocephalic and atraumatic without mass or lesion Palpation: Facial skeleton intact without bony stepoffs Salivary Glands: Palpable 2 cm mobile round mass in the area of parotid tail overlying angle of the mandible, no skin changes no fixation to the bone Facial Strength: Facial  motility symmetric and full bilaterally ENT Pinna: External ear intact and fully developed External canal: Canal is patent with intact skin Tympanic Membrane: Clear and mobile External Nose: No scar or anatomic deformity Internal Nose: Septum is relatively straight on anterior rhinoscopy. No polyp, or purulence. Mucosal edema and erythema present.  Bilateral inferior turbinate hypertrophy.  Lips, Teeth, and gums: Mucosa and teeth intact and viable TMJ: No pain to palpation with full mobility Oral cavity/oropharynx: No erythema or exudate, no lesions present Neck Neck and Trachea: Midline trachea without mass or lesion Thyroid: No mass or nodularity Lymphatics: No lymphadenopathy  Procedure: None  Studies Reviewed: CT Angio  IMPRESSION: 1. No acute intracranial abnormality. Unchanged moderate chronic small-vessel disease. 2. No large vessel occlusion. 3. 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 4. 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 5. 1.6 cm hyperattenuating mass in the left parotid gland, likely a primary parotid  neoplasm. Recommend ENT referral.  MRI Angio of the neck 04/29/23 FINDINGS: MRI HEAD FINDINGS   Brain: There is a small focus of elevated DWI signal in the right parietal subcortical white matter with faintly elevated FLAIR signal consistent with a small acute to early subacute infarct (15-39). There is a small remote lacunar infarct in the right frontal subcortical white matter with associated T2 shine through at the periphery (15-38, 20-38). There are additional small remote lacunar infarcts infarct in the bilateral centrum semiovale.   There is no acute intracranial hemorrhage or extra-axial fluid collection.   Background parenchymal volume is within expected limits for age. The ventricles are normal in size. Patchy FLAIR signal abnormality in the remainder of the supratentorial white matter is  consistent with moderate underlying chronic small-vessel ischemic change.   The pituitary and suprasellar region are normal. There is no mass lesion. There is no mass effect or midline shift.   Vascular: See below.   Skull and upper cervical spine: Normal marrow signal.   Sinuses/Orbits: The paranasal sinuses are clear. A right lens implant is noted. The globes and orbits are otherwise unremarkable.   Other: The mastoid air cells and middle ear cavities are clear.There is a 1.6 cm x 1.1 cm cystic structure overlying the left parotid gland.   MRA HEAD FINDINGS   Anterior circulation: The intracranial ICAs are patent, without significant stenosis or occlusion.   The bilateral MCAs and ACAS are patent, without proximal stenosis or occlusion.   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   Posterior circulation: The bilateral V4 segments are patent. The basilar artery is patent. The major cerebellar arteries appear patent.   The bilateral PCAs are patent, without proximal stenosis or occlusion. Posterior communicating arteries are not identified   There is no posterior circulation aneurysm.   Anatomic variants: None.   MRA NECK FINDINGS   Aortic arch: The imaged aortic arch is normal. The origins of the major branch vessels appear patent.   Right carotid system: The right common carotid artery is patent. There is motion artifact of the level of the bifurcation, limiting evaluation. There is probable hemodynamically significant stenosis in the proximal internal carotid artery. The distal internal carotid artery is patent. There is no convincing evidence of aneurysm or dissection.   Left carotid system: The left common, internal, and external carotid arteries appear patent, without evidence of hemodynamically significant stenosis or occlusion there is no evidence of dissection or aneurysm.   Vertebral arteries: The  vertebral arteries are patent with antegrade flow. There is no evidence of hemodynamically significant stenosis or occlusion there is no evidence of dissection or aneurysm.   Other: None.   IMPRESSION: 1. 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 2. Small remote lacunar infarcts in the right frontal subcortical white matter and bilateral centrum semiovale with background moderate chronic small-vessel ischemic changes. 3. Patent intracranial vasculature with no proximal stenosis or occlusion. 4. 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 5. Probable hemodynamically significant stenosis at the right ICA bifurcation, suboptimally evaluated due to motion artifact. Consider CTA neck to better evaluate the degree of stenosis. 6. 1.6 cm cystic structure overlying the left parotid gland is indeterminate but favored benign. Recommend correlation with physical exam, with repeat imaging or ENT referral as needed if this lesion grows clinically.  Assessment/Plan: Encounter Diagnoses  Name Primary?   Mass of left parotid gland Yes  Neoplasm of uncertain behavior of parotid gland    History of TIA (transient ischemic attack)    Anticoagulated    Conjunctival hemorrhage of right eye    77 year old male with history of recent TIA due to significant right-sided carotid stenosis, status post intervention and stenting, history of A-fib, on Eliquis and baby ASA, here for incidentally noted left parotid mass on CT angio done during workup of TIA.  On my exam there is palpable mobile 2 cm left parotid tail mass without overlying skin changes.  Cranial nerve exam is unremarkable.  No history of skin cancers.  No smoking history.  Differential diagnosis includes benign versus malignant neoplasm of salivary gland versus lymphadenopathy. I discussed exam findings and the need for biopsy with the patient.  He reports having redness on the inner portion of the  right eye that he noticed this morning, and there is evidence of conjunctival hemorrhage on my exam.  I advised the patient to discuss with PCP and his ophthalmologist to ensure no further evaluation is needed.   Schedule FNA (biopsy) of the left parotid mass Return after biopsy Discuss right eye conjunctival hemorrhage your PCP and ophthalmology  Thank you for allowing me to participate in the care of this patient. Please do not hesitate to contact me with any questions or concerns.   Ashok Croon, MD Otolaryngology [facility] Phone: [phone] Fax: [phone]    06/22/2023, 8:00 PM

## 2023-06-22 NOTE — Patient Instructions (Addendum)
Schedule FNA (biopsy) of the left parotid mass Return after biopsy Discuss right eye redness with your PCP and your eye doctor

## 2023-06-22 NOTE — Telephone Encounter (Signed)
Pt last seen by [name], NP on 06/16/23: 3.  Essential hypertension: -Patient's blood pressure today was controlled at 138/84 -Patient will continue lifestyle modification and monitor BP over the next 2 weeks.  Previously seen by [name] on 05/22/23. Will route to MD for him to address pt's BP concerns. Pt not currently on an antihypertensive and BP is consistently stage 2 hypertension range.

## 2023-06-23 NOTE — Telephone Encounter (Signed)
   Name:  Logan Wright  DOB:  03-08-1946  MRN:  [id number]   Primary Cardiologist: Kristeen Miss, MD  Chart reviewed as part of pre-operative protocol coverage. Patient was contacted 06/23/2023 in reference to pre-operative risk assessment for pending surgery as outlined below.  Logan Wright was last seen on 06/16/2023 by Robin Searing,  NP.     Preoperative clearance: -Patient's RCRI score is 0.9% -He was able to complete greater than 4 METS of activity -Patient will be fine to proceed with scheduled carotid biopsy barring any abnormalities documented on ZIO monitor which should be completed prior to scheduled biopsy.  Joni Reining, NP 06/23/2023, 11:22 AM

## 2023-06-23 NOTE — Telephone Encounter (Signed)
ADDENDUM TO CLEARANCE INFO: PHONE # FOR REQUESTING OFFICE IS: [phone] or [phone]

## 2023-06-23 NOTE — Telephone Encounter (Signed)
UPDATE ON PROCEDURE DATE: PROCEDURE HAS BEEN MOVED TO 07/17/23.

## 2023-06-23 NOTE — Telephone Encounter (Signed)
Logan Wright , patient's chart was reviewed for preoperative cardiac evaluation.  He/she was seen by you on 06/16/2023..You ordered a Zio monitor for syncope.  Do you want to hold off on the Zio monitor to allow for the biopsy of the parotid gland due on 07/10/2023?  Would you please comment on cardiac risk for upcoming procedure since office visit was less than 2 months ago. H   Please route your response to p cv div preop.  Thank you, Joni Reining NP

## 2023-06-24 ENCOUNTER — Encounter: Payer: Self-pay | Admitting: Cardiovascular Disease

## 2023-06-26 ENCOUNTER — Ambulatory Visit: Payer: PPO | Admitting: Neurology

## 2023-06-30 ENCOUNTER — Other Ambulatory Visit: Payer: Self-pay

## 2023-06-30 DIAGNOSIS — I6521 Occlusion and stenosis of right carotid artery: Secondary | ICD-10-CM

## 2023-07-06 NOTE — Telephone Encounter (Addendum)
Update from NP noted below. Will route to pharm team to please review so we can bundle and send to surgical team. Have a secure chat out to [name] to review recs re: ASA and clarification if patient is on Plavix from note.

## 2023-07-06 NOTE — Telephone Encounter (Addendum)
Patient Name: Logan Wright  DOB: 01-22-1946 MRN: [id number]  Primary Cardiologist: Kristeen Miss, MD  Chart reviewed as part of pre-operative protocol coverage. To summarize recommendations: - Per Robin Searing NP, he reviewed patient's Zio monitor and reports patient is fine to proceed with scheduled procedure at this time. - Per review between pharmD and Dr. Mayford Knife (DOD), patient may hold Eliquis 2 days prior to procedure without bridge. I did relay this to the patient. - Regarding aspirin, the patient states he was not told that he would need to hold this for the procedure. I also asked Dr. Irene Pap in secure chat whether aspirin would need to be held. She indicated the surgical request did not come from her office but rather radiology. They did not outline their office number on our request. Dr. Irene Pap indicated that she would not typically hold aspirin in preparation for an FNA, but she would defer to the [facility] Radiology team to make the decision. If the [facility] Radiology team feels that the patient must hold aspirin prior to the procedure, then we recommend they reach out to Vascular Surgery as the patient had recent carotid stenting in 05/2023. Patient knows not to stop aspirin unless otherwise instructed by his surgical team.  He also plans to reach out to [facility]/Dr. [name] regarding his blood pressure journal results but average was similar to recent OV so do not suspect this would impact decision to move forward with biopsy on Friday.  Will route this bundled recommendation to requesting provider via Epic fax function. Please call with questions.  Laurann Montana, PA-C 07/06/2023, 2:39 PM

## 2023-07-06 NOTE — Telephone Encounter (Signed)
Helping to cover preop box today. Zio results are now preliminarily available in Epic. MD has not formally finalized but the whole report has been imported. Will route to Robin Searing NP to provide final pre-op recs. [name]. - Please route response to P CV DIV PREOP (the pre-op pool). Thank you.

## 2023-07-06 NOTE — Progress Notes (Unsigned)
Leonie Douglas, MD  Leodis Rains D That is ok       Previous Messages    ----- Message ----- From: Leodis Rains D Sent: 07/06/2023   4:42 PM EDT To: Leonie Douglas, MD Subject: RE: Baby Aspirin                              Ok our usual recommendation is to hold Aspirin 5 days prior.  Just wanted to be sure you were ok with that same thing.  Thanks Archie Patten ----- Message ----- From: Leonie Douglas, MD Sent: 07/06/2023   4:09 PM EDT To: Dorise Hiss Subject: RE: Baby Aspirin                              Holding both ok for biopsy. Please resume as soon as possible post biopsy. Thanks. Tom ----- Message ----- From: Leodis Rains D Sent: 07/06/2023   4:08 PM EDT To: Leonie Douglas, MD Subject: Baby Aspirin                                  Good afternoon!  We have this patient scheduled for his [place] FNA BX on 07/17/23.  We received ok for him to HOLD his Eliquis 2 days prior. Please advise if patient can hold baby aspirin prior as well Dr Georgian Co recommended that we reach out to you in regards to the baby aspirin hold   Thanks

## 2023-07-06 NOTE — Telephone Encounter (Signed)
Patient with diagnosis of afib on Eliquis for anticoagulation.    Procedure: [place] FNA Bx OF A PAROTID GLAN  Date of procedure: 07/10/23   CHA2DS2-VASc Score = 6   This indicates a 9.7% annual risk of stroke. The patient's score is based upon: CHF History: 0 HTN History: 1 Diabetes History: 0 Stroke History: 2 Vascular Disease History: 1 Age Score: 2 Gender Score: 0      He was admitted to the hospital on 05/05/2023 slurred speech and left facial droop and found to have TIA. He underwent MRI showed a small acute infarct in the right parietal subcortical white matter. 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 He also had incidental finding of a attenuating mass in the left parotid gland. Found to have 80% stenosis of the proximal right cervical ICA. He underwent Right transcarotid artery revascularization (TCAR) 8/1 where his Eliquis was held 2 days.   CrCl 54 ml/min Platelet count 174  Given the fact that his stroke was felt to be more from carotid disease and the urgency of this procedure (possible malignancy) I think its reasonable that patient may hold 2 days without a bridge. Will confirm with Dr. Mayford Knife (DOD).  **This guidance is not considered finalized until pre-operative APP has relayed final recommendations.**

## 2023-07-07 NOTE — Telephone Encounter (Signed)
Please let Mr. Gartman know that his Cardizem was discontinued because of brain fog and bradycardia.  We can try lisinopril 2.5 mg daily to see if this would assist in getting his blood pressure at goal.  We will check a BMET in 1 week.  Please let me know if you have any further questions.  Robin Searing, NP

## 2023-07-07 NOTE — Progress Notes (Unsigned)
Fax received from [facility]iology on 07/03/23 for medical clearance/medication hold for biopsy to be signed by T. Lenell Antu, MD.  Provider signed on 07/06/23, form faxed back to sender on 07/06/23, verified successful, scanned into pt's chart.

## 2023-07-09 NOTE — Assessment & Plan Note (Signed)
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  Denies any hot flashes or arthralgias or myalgias.   Breast cancer surveillance: 1.  Breast exam 07/13/2023: Benign 2. 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    We discussed the pros and cons of doing Signater[employer] testing.  He will research more about it and will inform us if he wants to pursue it.   Return to clinic in 1 year for follow-up

## 2023-07-10 ENCOUNTER — Ambulatory Visit (HOSPITAL_COMMUNITY): Payer: PPO

## 2023-07-13 ENCOUNTER — Inpatient Hospital Stay: Payer: PPO | Attending: Hematology and Oncology | Admitting: Hematology and Oncology

## 2023-07-13 VITALS — BP 126/59 | HR 67 | Temp 97.9°F | Resp 18 | Ht 67.0 in | Wt 177.4 lb

## 2023-07-13 DIAGNOSIS — Z1501 Genetic susceptibility to malignant neoplasm of breast: Secondary | ICD-10-CM | POA: Insufficient documentation

## 2023-07-13 DIAGNOSIS — Z79810 Long term (current) use of selective estrogen receptor modulators (SERMs): Secondary | ICD-10-CM | POA: Diagnosis not present

## 2023-07-13 DIAGNOSIS — Z170 Estrogen receptor positive status [ER+]: Secondary | ICD-10-CM | POA: Diagnosis not present

## 2023-07-13 DIAGNOSIS — C50421 Malignant neoplasm of upper-outer quadrant of right male breast: Secondary | ICD-10-CM

## 2023-07-13 NOTE — Progress Notes (Signed)
Patient Care Team: Jackelyn Poling, DO as PCP - General (Family Medicine) [name], Deloris Ping, MD as PCP - Cardiology (Cardiology) Magrinat, Valentino Hue, MD (Inactive) as Consulting Physician (Oncology) Abigail Miyamoto, MD as Consulting Physician (General Surgery) Dorothy Puffer, MD as Consulting Physician (Radiation Oncology) Janalyn Harder, MD (Inactive) as Consulting Physician (Dermatology) Durene Romans, MD as Consulting Physician (Orthopedic Surgery) Pershing Proud, RN as Oncology Nurse Navigator Donnelly Angelica, RN as Oncology Nurse Navigator Leonie Douglas, MD as Consulting Physician (Vascular Surgery)  DIAGNOSIS:  Encounter Diagnosis  Name Primary?   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  The Common Hereditary Gene Panel offered by [facility] includes sequencing and/or deletion duplication testing of the following 48 genes: APC, ATM, AXIN2, BARD1, BMPR1A, BRCA1, BRCA2, BRIP1, CDH1, CDK4, CDKN2A (p14ARF), CDKN2A (p16INK4a), CHEK2, CTNNA1, DICER1, EPCAM (Deletion/duplication testing only), GREM1 (promoter region deletion/duplication testing only), KIT, MEN1, MLH1, MSH2, MSH3, MSH6, MUTYH, NBN, NF1, NHTL1, PALB2, PDGFRA, PMS2, POLD1, POLE, PTEN, RAD50, RAD51C, RAD51D, RNF43, SDHB, SDHC, SDHD, SMAD4, SMARCA4.  STK11, TP53, TSC1, TSC2, and VHL.  The following genes were evaluated for sequence changes only: SDHA and HOXB13 c.251G>A variant only. The report date is 05/14/2020.   05/17/2020 - 10/11/2020 Chemotherapy   Patient is on Treatment Plan : BREAST Adjuvant CMF IV q21d       CHIEF COMPLIANT: Follow-up on tamoxifen therapy  Discussed the use of AI scribe software for clinical note transcription with the patient, who gave verbal consent to proceed.  History of Present Illness   The patient, with a history of breast cancer, has been on tamoxifen for three years without any issues. He exercises regularly, which he believes helps keep him young. Recently, while on vacation in [place], the patient experienced a TIA, which lasted for about 15 to 30 seconds. Upon returning, a full workup was done, and a stent was inserted in the right carotid artery due to 80% occlusion. He also mentions a scheduled biopsy for a small growth in the parotid gland. The patient has a BRCA1 gene mutation    ALLERGIES:  has No Known Allergies.  MEDICATIONS:  Current Outpatient Medications  Medication Sig Dispense Refill   allopurinol (ZYLOPRIM) 100 MG tablet Take 200 mg by mouth daily.     apixaban (ELIQUIS) 5 MG TABS tablet Take 1 tablet (5 mg total) by mouth 2 (two) times daily. Hold this medication 48 prior to vascular surgery. 180 tablet 1   aspirin EC 81 MG tablet Take 81 mg by mouth daily. Swallow whole.     atorvastatin (LIPITOR) 80 MG tablet Take 80 mg by mouth at bedtime.     GLUCOSAMINE-CHONDROITIN PO Take 2 tablets by mouth daily.     Multiple Vitamin (MULTIVITAMIN) tablet Take 1 tablet by mouth daily.      Omega-3 Fatty Acids (FISH OIL ULTRA) 1400 MG CAPS  Take 1,400-2,800 mg by mouth See admin instructions. Take 1400 mg in the morning and 2800 mg in the evening     Potassium 99 MG TABS Take 1 tablet by mouth daily.     No current facility-administered medications for this visit.    PHYSICAL EXAMINATION: ECOG  PERFORMANCE STATUS: 1 - Symptomatic but completely ambulatory  Vitals:   07/13/23 1131  BP: (!) 126/59  Pulse: 67  Resp: 18  Temp: 97.9 F (36.6 C)  SpO2: 97%   Filed Weights   07/13/23 1131  Weight: 177 lb 6.4 oz (80.5 kg)      LABORATORY DATA:  I have reviewed the data as listed    Latest Ref Rng & Units 05/15/2023    4:40 AM 05/14/2023    9:23 AM 05/06/2023   12:47 AM  CMP  Glucose 70 - 99 mg/dL 96  91  96   BUN 8 - 23 mg/dL 21  26  19    Creatinine 0.61 - 1.24 mg/dL 4.09  8.11  9.14   Sodium 135 - 145 mmol/L 137  136  139   Potassium 3.5 - 5.1 mmol/L 3.9  3.7  3.9   Chloride 98 - 111 mmol/L 107  103  107   CO2 22 - 32 mmol/L 21  21  26    Calcium 8.9 - 10.3 mg/dL 7.7  8.6  8.5   Total Protein 6.5 - 8.1 g/dL  6.1    Total Bilirubin 0.3 - 1.2 mg/dL  0.8    Alkaline Phos 38 - 126 U/L  70    AST 15 - 41 U/L  30    ALT 0 - 44 U/L  28      Lab Results  Component Value Date   WBC 8.7 05/15/2023   HGB 10.6 (L) 05/15/2023   HCT 32.2 (L) 05/15/2023   MCV 86.6 05/15/2023   PLT 174 05/15/2023   NEUTROABS 4.9 05/05/2023    ASSESSMENT & PLAN:  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  Denies any hot flashes or arthralgias or myalgias. Because of his recent history of TIAs and CVA, I recommend that we discontinue tamoxifen at this time.   Breast cancer surveillance: 1.  Breast exam 07/13/2023: Benign 2. 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      Breast Cancer Three years post-diagnosis, on Tamoxifen with no reported side effects. Recent Transient Ischemic Attack (TIA) and carotid stent placement due to 80% occlusion. Discussed the risk of stroke with Tamoxifen use and the potential link to the recent TIA. -Discontinue Tamoxifen due to potential increased risk of stroke. -Continue annual mammograms, next due in December 2024.  Transient Ischemic Attack (TIA) Recent TIA while on vacation, subsequent workup revealed 80% occlusion of the right carotid artery, which was stented. -Continue current management as per vascular/cardiology team.  Parotid Gland Indeterminate cystic structure overlying the left parotid gland, favored to be benign. Biopsy scheduled. -Continue with planned biopsy as per ENT team.  BRCA1 Gene Patient is a carrier of the BRCA1 gene, which increases the risk of breast, prostate, pancreatic cancer, and melanoma. -Discuss prostate cancer screening with primary care doctor due to increased risk associated with BRCA1 gene. -Continue current monitoring for breast cancer with annual mammograms.  General Health Maintenance / Followup Plans -Initiate Signatera blood test for monitoring of potential cancer recurrence. -Schedule follow-up visit in one year.          Orders Placed This Encounter  Procedures   MM DIAG BREAST TOMO UNI LEFT    Standing Status:   Future    Standing Expiration Date:   07/12/2024    Order Specific Question:   Reason for Exam (SYMPTOM  OR DIAGNOSIS REQUIRED)    Answer:   BRCA 1 gene mutation    Order Specific Question:   Preferred imaging location?    Answer:   [facility]    Order Specific Question:   Release to patient    Answer:   Immediate   The patient has  a good understanding of the overall plan. he agrees with it. he will call with any problems that may develop before the next visit here. Total time spent: 30 mins including face to face time and time spent for planning, charting and co-ordination of care   Tamsen Meek, MD 07/13/23

## 2023-07-17 ENCOUNTER — Other Ambulatory Visit (INDEPENDENT_AMBULATORY_CARE_PROVIDER_SITE_OTHER): Payer: Self-pay | Admitting: Otolaryngology

## 2023-07-17 ENCOUNTER — Other Ambulatory Visit: Payer: Self-pay

## 2023-07-17 ENCOUNTER — Ambulatory Visit (HOSPITAL_COMMUNITY)
Admission: RE | Admit: 2023-07-17 | Discharge: 2023-07-17 | Disposition: A | Discharge status: Home / Self Care | Payer: PPO | Source: Ambulatory Visit | Attending: Otolaryngology | Admitting: Otolaryngology

## 2023-07-17 DIAGNOSIS — D37030 Neoplasm of uncertain behavior of the parotid salivary glands: Secondary | ICD-10-CM | POA: Diagnosis not present

## 2023-07-17 DIAGNOSIS — K118 Other diseases of salivary glands: Secondary | ICD-10-CM | POA: Insufficient documentation

## 2023-07-17 DIAGNOSIS — Z8673 Personal history of transient ischemic attack (TIA), and cerebral infarction without residual deficits: Secondary | ICD-10-CM

## 2023-07-17 DIAGNOSIS — H1131 Conjunctival hemorrhage, right eye: Secondary | ICD-10-CM

## 2023-07-17 DIAGNOSIS — Z7901 Long term (current) use of anticoagulants: Secondary | ICD-10-CM

## 2023-07-17 MED ORDER — SODIUM CHLORIDE 0.9 % IV SOLN: INTRAVENOUS | Status: DC | PRN

## 2023-07-17 MED ORDER — LIDOCAINE HCL (PF) 1 % IJ SOLN
5.0000 mL | Freq: Once | INTRAMUSCULAR | Status: AC
  Administered 2023-07-17: 5 mL via INTRADERMAL

## 2023-07-20 LAB — SURGICAL PATHOLOGY

## 2023-07-28 ENCOUNTER — Telehealth (INDEPENDENT_AMBULATORY_CARE_PROVIDER_SITE_OTHER): Payer: Self-pay

## 2023-07-28 ENCOUNTER — Other Ambulatory Visit (INDEPENDENT_AMBULATORY_CARE_PROVIDER_SITE_OTHER): Payer: Self-pay | Admitting: Otolaryngology

## 2023-07-28 ENCOUNTER — Encounter: Payer: Self-pay | Admitting: Oncology

## 2023-07-28 MED ORDER — METHYLPREDNISOLONE 4 MG PO TBPK: ORAL_TABLET | ORAL | 1 refills | Status: DC

## 2023-07-28 MED ORDER — AMOXICILLIN-POT CLAVULANATE 875-125 MG PO TABS: 1.0000 | ORAL_TABLET | Freq: Two times a day (BID) | ORAL | 0 refills | Status: DC

## 2023-07-28 NOTE — Telephone Encounter (Signed)
TC

## 2023-07-28 NOTE — Progress Notes (Signed)
ENT Progress Note   Patient with hx of left parotid tumor seen 06/19/2023 in the office, had FNA done 1.5 weeks ago by radiology, and reports swelling and mild discomfort over the area where biopsy was done which started today.  Spoke with the patient over the phone, denies skin redness or drainage from the biopsy site, denies facial weakness. Denies paresthesias. We discussed that the most likely explanation is either superficial infection at the biopsy site or interval growth of his parotid mass. Advised the patient to come in for in-office evaluation tomorrow at 2 pm. Rx for abx *Augmentin* and steroid taper were sent to his pharmacy. All questions have been answered.

## 2023-07-28 NOTE — Telephone Encounter (Signed)
A lot  of rapid swelling from biopsy on face that [facility] did. Patient said within the last couple of hours. He also needs to reschedule appointment he will be out of town from November the 5th through the 11th. Patient wants a call back. His contact number is [phone].

## 2023-07-29 ENCOUNTER — Ambulatory Visit (INDEPENDENT_AMBULATORY_CARE_PROVIDER_SITE_OTHER): Payer: PPO | Admitting: Otolaryngology

## 2023-07-29 ENCOUNTER — Encounter (INDEPENDENT_AMBULATORY_CARE_PROVIDER_SITE_OTHER): Payer: Self-pay | Admitting: Otolaryngology

## 2023-07-29 VITALS — BP 139/68 | HR 74 | Temp 98.6°F | Ht 67.0 in | Wt 172.0 lb

## 2023-07-29 DIAGNOSIS — K118 Other diseases of salivary glands: Secondary | ICD-10-CM

## 2023-07-29 DIAGNOSIS — D37030 Neoplasm of uncertain behavior of the parotid salivary glands: Secondary | ICD-10-CM

## 2023-07-29 NOTE — Progress Notes (Signed)
ENT Progress Note:  Update 1016/24: He returns for f/u. Noticed that his left parotid mass increased in size. Denies pain, denies facial weakness, numbness or tingling. No skin color changes. Took 2 doses of Augmentin (started last night), and some steroids we prescribed.   Initial Evaluation 06/22/23  Reason for Consult: parotid mass   HPI: Logan Wright is an 77 y.o. male with hx of a recent TIA x 2 in the setting of significant carotid artery stenosis who is here for evaluation of incidentally noted left parotid mass.  He had two TIAs and had imaging done in the ED, was found to have 80% occlusion of the right internal carotid artery which required surgery/stenting. He states he had no facial weakness with TIAs, but when he had his first TIA  in [place] on a cruise 04/19/23, he developed tingling and facial droop on the left side, then again when he came back 04/29/23 and was diagnosed with second TIA- he had tingling and facial droop - first one 30 sec, then 20-30 sec. He is on Eliquis for Afib + ASA 81 mg.  He reports history of right breast cancer treated with right sided mastectomy surgery + chemo (was previously also taking Plavix, but does not take it any longer).  He did not notice left parotid mass until was found on imaging, denies history of skin cancers in the past, denies history of smoking, feels he is back to normal from neurologic standpoint after TIAs and no longer has facial weakness numbness or tingling.   Records Reviewed:  Was admitted 05/14/2023 for right carotid artery revascularization due to severe carotid artery stenosis and history of TIA  Office notes from vascular surgery 06/16/2023 by Dr. Lenell Antu ASSESSMENT / PLAN: 77 y.o. male status post right TCAR 05/14/2023 for symptomatic carotid artery stenosis.  He has done very well since surgery.  He can discontinue Plavix at this time.  He should continue aspirin and Eliquis indefinitely.  He should continue high intensity statin  therapy indefinitely.  I will see him again in 1 year with repeat carotid duplex    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   Echocardiogram 07/2020: 42 mm   Breast cancer (HCC) 2021   right breast   Breast cancer, right (HCC)    CKD (chronic kidney disease), stage III (HCC)    Colon adenoma 2015   Coronary artery calcification seen on CT scan    Diverticulosis    DENIES    DJD (degenerative joint disease)    Dysrhythmia    A-fib   ED (erectile dysfunction)    Elevated blood pressure 2007-2008   Esophageal reflux    GERD (gastroesophageal reflux disease)    Gout    Gynecomastia    LEFT   Hearing loss    HTN (hypertension)    Hypercholesteremia    Non-cardiac chest pain    OA (osteoarthritis)    LEFT SHOULDER   Onychomycosis of foot with other complication    PAF (paroxysmal atrial fibrillation) (HCC)    Pre-diabetes    Seborrhea    Severe frontal headaches    RESOLVED    Vertigo    RESOLVED    Wears glasses    Wears hearing aid     Past Surgical History:  Procedure Laterality Date   COLONSCOPY      HIP ARTHROPLASTY Bilateral    AT [place]  IR CV LINE INJECTION  05/14/2020   IR IMAGING GUIDED PORT INSERTION  05/17/2020   IR REMOVAL TUN ACCESS W/ PORT W/O FL MOD SED  05/17/2020   KNEE ARTHROSCOPY     UNSURE WHICH KNEE    MASTECTOMY Right 03/16/2020   MASTECTOMY W/ SENTINEL NODE BIOPSY Right 03/16/2020   Procedure: RIGHT BREAST MASTECTOMY WITH SENTINEL LYMPH NODE BIOPSY;  Surgeon: Abigail Miyamoto, MD;  Location: [facility];  Service: General;  Laterality: Right;   PORTACATH PLACEMENT Left 05/08/2020   Procedure: INSERTION PORT-A-CATH WITH ULTRASOUND;  Surgeon: Abigail Miyamoto, MD;  Location: [facility] OR;  Service: General;  Laterality: Left;  LMA   TOTAL KNEE ARTHROPLASTY Left 11/23/2018   Procedure: TOTAL KNEE ARTHROPLASTY;  Surgeon: Durene Romans, MD;  Location:  [facility];  Service: Orthopedics;  Laterality: Left;  70 mins   TRANSCAROTID ARTERY REVASCULARIZATION  Right 05/14/2023   Procedure: Transcarotid Artery Revascularization;  Surgeon: Leonie Douglas, MD;  Location: [facility] OR;  Service: Vascular;  Laterality: Right;   ULTRASOUND GUIDANCE FOR VASCULAR ACCESS Left 05/14/2023   Procedure: ULTRASOUND GUIDANCE FOR VASCULAR ACCESS;  Surgeon: Leonie Douglas, MD;  Location: [facility] OR;  Service: Vascular;  Laterality: Left;    Family History  Problem Relation Age of Onset   Heart attack Mother    Stroke Mother    Heart failure Father        86   Hypertension Neg Hx     Social History:  reports that he has never smoked. He has never used smokeless tobacco. He reports that he does not drink alcohol and does not use drugs.  Allergies: No Known Allergies  Medications: I have reviewed the patient's current medications.  The PMH, PSH, Medications, Allergies, and SH were reviewed and updated.  ROS: Constitutional: Negative for fever, weight loss and weight gain. Cardiovascular: Negative for chest pain and dyspnea on exertion. Respiratory: Is not experiencing shortness of breath at rest. Gastrointestinal: Negative for nausea and vomiting. Neurological: Negative for headaches. Psychiatric: The patient is not nervous/anxious  Blood pressure 139/68, pulse 74, temperature 98.6 F (37 C), height 5\' 7"  (1.702 m), weight 172 lb (78 kg), SpO2 96%.  PHYSICAL EXAM:  Exam: General: Well-developed, well-nourished Communication and Voice: Slightly raspy Respiratory Respiratory effort: Equal inspiration and expiration without stridor Cardiovascular Peripheral Vascular: Warm extremities with equal color/perfusion Eyes: No nystagmus with equal extraocular motion bilaterally Neuro/Psych/Balance: Patient oriented to person, place, and time; Appropriate mood and affect; Gait is intact with no imbalance; Cranial nerves I-XII are intact Head and Face Inspection:  Normocephalic and atraumatic without mass or lesion Palpation: Facial skeleton intact without bony stepoffs Salivary Glands: Palpable ~2-3 cm mobile round mass in the area of the left parotid tail overlying angle of the mandible, no skin changes or tenderness Facial Strength: Facial motility symmetric and full bilaterally ENT Pinna: External ear intact and fully developed Neck Neck and Trachea: Midline trachea without mass or lesion Thyroid: No mass or nodularity Lymphatics: No lymphadenopathy  Procedure: None  Studies Reviewed: CT Angio  IMPRESSION: 1. No acute intracranial abnormality. Unchanged moderate chronic small-vessel disease. 2. No large vessel occlusion. 3. 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 4. 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 5. 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 Recommend ENT referral.  MRI Angio of the neck 04/29/23 FINDINGS: MRI HEAD FINDINGS   Brain: There is a small focus of elevated  DWI signal in the right parietal subcortical white matter with faintly elevated FLAIR signal consistent with a small acute to early subacute infarct (15-39). There is a small remote lacunar infarct in the right frontal subcortical white matter with associated T2 shine through at the periphery (15-38, 20-38). There are additional small remote lacunar infarcts infarct in the bilateral centrum semiovale.   There is no acute intracranial hemorrhage or extra-axial fluid collection.   Background parenchymal volume is within expected limits for age. The ventricles are normal in size. Patchy FLAIR signal abnormality in the remainder of the supratentorial white matter is consistent with moderate underlying chronic small-vessel ischemic change.   The pituitary and suprasellar region are normal. There is no mass lesion. There is no mass effect or midline shift.    Vascular: See below.   Skull and upper cervical spine: Normal marrow signal.   Sinuses/Orbits: The paranasal sinuses are clear. A right lens implant is noted. The globes and orbits are otherwise unremarkable.   Other: The mastoid air cells and middle ear cavities are clear.There is a 1.6 cm x 1.1 cm cystic structure overlying the left parotid gland.   MRA HEAD FINDINGS   Anterior circulation: The intracranial ICAs are patent, without significant stenosis or occlusion.   The bilateral MCAs and ACAS are patent, without proximal stenosis or occlusion.   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   Posterior circulation: The bilateral V4 segments are patent. The basilar artery is patent. The major cerebellar arteries appear patent.   The bilateral PCAs are patent, without proximal stenosis or occlusion. Posterior communicating arteries are not identified   There is no posterior circulation aneurysm.   Anatomic variants: None.   MRA NECK FINDINGS 04/29/23   Aortic arch: The imaged aortic arch is normal. The origins of the major branch vessels appear patent.   Right carotid system: The right common carotid artery is patent. There is motion artifact of the level of the bifurcation, limiting evaluation. There is probable hemodynamically significant stenosis in the proximal internal carotid artery. The distal internal carotid artery is patent. There is no convincing evidence of aneurysm or dissection.   Left carotid system: The left common, internal, and external carotid arteries appear patent, without evidence of hemodynamically significant stenosis or occlusion there is no evidence of dissection or aneurysm.   Vertebral arteries: The vertebral arteries are patent with antegrade flow. There is no evidence of hemodynamically significant stenosis or occlusion there is no evidence of dissection or aneurysm.   Other: None.    IMPRESSION: 1. 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 2. Small remote lacunar infarcts in the right frontal subcortical white matter and bilateral centrum semiovale with background moderate chronic small-vessel ischemic changes. 3. Patent intracranial vasculature with no proximal stenosis or occlusion. 4. 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 5. Probable hemodynamically significant stenosis at the right ICA bifurcation, suboptimally evaluated due to motion artifact. Consider CTA neck to better evaluate the degree of stenosis. 6. 1.6 cm cystic structure overlying the left parotid gland is indeterminate but favored benign. Recommend correlation with physical exam, with repeat imaging or ENT referral as needed if this lesion grows clinically.  Assessment/Plan: Encounter Diagnoses  Name Primary?   Mass of left parotid gland Yes   Neoplasm of uncertain behavior of parotid gland     77 year old male with history of recent TIA due to significant right-sided  carotid stenosis, status post intervention and stenting, history of A-fib, on Eliquis and baby ASA, here for incidentally noted left parotid mass on CT angio done during workup of TIA.  On my exam there is palpable mobile 2 cm left parotid tail mass without overlying skin changes.  Cranial nerve exam is unremarkable.  No history of skin cancers.  No smoking history.  Differential diagnosis includes benign versus malignant neoplasm of salivary gland versus lymphadenopathy. I discussed exam findings and the need for biopsy with the patient.  He reports having redness on the inner portion of the right eye that he noticed this morning, and there is evidence of conjunctival hemorrhage on my exam.  I advised the patient to discuss with PCP and his ophthalmologist to ensure no further evaluation is needed.   Schedule FNA (biopsy) of the left parotid mass Return after biopsy Discuss right  eye conjunctival hemorrhage your PCP and ophthalmology  Update 07/29/23 He had core bx of the left parotid mass with results negative for malignancy. He noticed that the left parotid gland mass seems to be bigger now. No pain, no skin changes, and no facial weakness. Exam with palpable 2-3 cm left parotid mass CN 2-12 intact.   Pathology for left parotid mass core biopsy  A. PAROTID MASS, LEFT, NEEDLE CORE BIOPSY:       Benign cyst wall with oncocytic features consistent with salivary  duct cyst.       Negative for malignancy.   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

## 2023-07-30 ENCOUNTER — Encounter: Payer: Self-pay | Admitting: Hematology and Oncology

## 2023-07-31 ENCOUNTER — Ambulatory Visit (INDEPENDENT_AMBULATORY_CARE_PROVIDER_SITE_OTHER): Payer: PPO | Admitting: Otolaryngology

## 2023-07-31 DIAGNOSIS — Z7901 Long term (current) use of anticoagulants: Secondary | ICD-10-CM

## 2023-07-31 DIAGNOSIS — D37030 Neoplasm of uncertain behavior of the parotid salivary glands: Secondary | ICD-10-CM | POA: Diagnosis not present

## 2023-07-31 DIAGNOSIS — Z8673 Personal history of transient ischemic attack (TIA), and cerebral infarction without residual deficits: Secondary | ICD-10-CM | POA: Diagnosis not present

## 2023-07-31 DIAGNOSIS — K118 Other diseases of salivary glands: Secondary | ICD-10-CM | POA: Diagnosis not present

## 2023-07-31 NOTE — Telephone Encounter (Signed)
I spoke with him, he is going to come to clinic this morning to be seen.

## 2023-07-31 NOTE — Progress Notes (Signed)
ENT Progress Note:  Update 07/31/23 He returns for f/u and reports that his left parotid mass increased in size. No pain or facial weakness. No skin changes. No fevers, chills. No drainage in his mouth.   Update 1016/24: He returns for f/u. Noticed that his left parotid mass increased in size. Denies pain, denies facial weakness, numbness or tingling. No skin color changes. Took 2 doses of Augmentin (started last night), and some steroids we prescribed.   Initial Evaluation 06/22/23  Reason for Consult: parotid mass   HPI: Logan Wright is an 77 y.o. male with hx of a recent TIA x 2 in the setting of significant carotid artery stenosis who is here for evaluation of incidentally noted left parotid mass.  He had two TIAs and had imaging done in the ED, was found to have 80% occlusion of the right internal carotid artery which required surgery/stenting. He states he had no facial weakness with TIAs, but when he had his first TIA  in [place] on a cruise 04/19/23, he developed tingling and facial droop on the left side, then again when he came back 04/29/23 and was diagnosed with second TIA- he had tingling and facial droop - first one 30 sec, then 20-30 sec. He is on Eliquis for Afib + ASA 81 mg.  He reports history of right breast cancer treated with right sided mastectomy surgery + chemo (was previously also taking Plavix, but does not take it any longer).  He did not notice left parotid mass until was found on imaging, denies history of skin cancers in the past, denies history of smoking, feels he is back to normal from neurologic standpoint after TIAs and no longer has facial weakness numbness or tingling.   Records Reviewed:  Was admitted 05/14/2023 for right carotid artery revascularization due to severe carotid artery stenosis and history of TIA  Office notes from vascular surgery 06/16/2023 by Dr. Lenell Antu ASSESSMENT / PLAN: 77 y.o. male status post right TCAR 05/14/2023 for symptomatic carotid artery  stenosis.  He has done very well since surgery.  He can discontinue Plavix at this time.  He should continue aspirin and Eliquis indefinitely.  He should continue high intensity statin therapy indefinitely.  I will see him again in 1 year with repeat carotid duplex    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   Echocardiogram 07/2020: 42 mm   Breast cancer (HCC) 2021   right breast   Breast cancer, right (HCC)    CKD (chronic kidney disease), stage III (HCC)    Colon adenoma 2015   Coronary artery calcification seen on CT scan    Diverticulosis    DENIES    DJD (degenerative joint disease)    Dysrhythmia    A-fib   ED (erectile dysfunction)    Elevated blood pressure 2007-2008   Esophageal reflux    GERD (gastroesophageal reflux disease)    Gout    Gynecomastia    LEFT   Hearing loss    HTN (hypertension)    Hypercholesteremia    Non-cardiac chest pain    OA (osteoarthritis)    LEFT SHOULDER   Onychomycosis of foot with other complication    PAF (paroxysmal atrial fibrillation) (HCC)    Pre-diabetes    Seborrhea    Severe frontal headaches    RESOLVED    Vertigo    RESOLVED    Wears glasses  Wears hearing aid     Past Surgical History:  Procedure Laterality Date   COLONSCOPY      HIP ARTHROPLASTY Bilateral    AT [place]    IR CV LINE INJECTION  05/14/2020   IR IMAGING GUIDED PORT INSERTION  05/17/2020   IR REMOVAL TUN ACCESS W/ PORT W/O FL MOD SED  05/17/2020   KNEE ARTHROSCOPY     UNSURE WHICH KNEE    MASTECTOMY Right 03/16/2020   MASTECTOMY W/ SENTINEL NODE BIOPSY Right 03/16/2020   Procedure: RIGHT BREAST MASTECTOMY WITH SENTINEL LYMPH NODE BIOPSY;  Surgeon: Abigail Miyamoto, MD;  Location: [facility];  Service: General;  Laterality: Right;   PORTACATH PLACEMENT Left 05/08/2020   Procedure: INSERTION PORT-A-CATH WITH ULTRASOUND;  Surgeon: Abigail Miyamoto, MD;   Location: [facility] OR;  Service: General;  Laterality: Left;  LMA   TOTAL KNEE ARTHROPLASTY Left 11/23/2018   Procedure: TOTAL KNEE ARTHROPLASTY;  Surgeon: Durene Romans, MD;  Location: [facility];  Service: Orthopedics;  Laterality: Left;  70 mins   TRANSCAROTID ARTERY REVASCULARIZATION  Right 05/14/2023   Procedure: Transcarotid Artery Revascularization;  Surgeon: Leonie Douglas, MD;  Location: [facility] OR;  Service: Vascular;  Laterality: Right;   ULTRASOUND GUIDANCE FOR VASCULAR ACCESS Left 05/14/2023   Procedure: ULTRASOUND GUIDANCE FOR VASCULAR ACCESS;  Surgeon: Leonie Douglas, MD;  Location: [facility] OR;  Service: Vascular;  Laterality: Left;    Family History  Problem Relation Age of Onset   Heart attack Mother    Stroke Mother    Heart failure Father        64   Hypertension Neg Hx     Social History:  reports that he has never smoked. He has never used smokeless tobacco. He reports that he does not drink alcohol and does not use drugs.  Allergies: No Known Allergies  Medications: I have reviewed the patient's current medications.  The PMH, PSH, Medications, Allergies, and SH were reviewed and updated.  ROS: Constitutional: Negative for fever, weight loss and weight gain. Cardiovascular: Negative for chest pain and dyspnea on exertion. Respiratory: Is not experiencing shortness of breath at rest. Gastrointestinal: Negative for nausea and vomiting. Neurological: Negative for headaches. Psychiatric: The patient is not nervous/anxious  There were no vitals taken for this visit.  PHYSICAL EXAM:  Exam: General: Well-developed, well-nourished Communication and Voice: Slightly raspy Respiratory Respiratory effort: Equal inspiration and expiration without stridor Cardiovascular Peripheral Vascular: Warm extremities with equal color/perfusion Eyes: No nystagmus with equal extraocular motion bilaterally Neuro/Psych/Balance: Patient oriented to person, place, and time; Appropriate mood and  affect; Gait is intact with no imbalance; Cranial nerves I-XII are intact Head and Face Inspection: Normocephalic and atraumatic without mass or lesion Palpation: Facial skeleton intact without bony stepoffs Salivary Glands: Previously seen palpable ~2-3 cm mobile round mass in the area of the left parotid tail overlying angle of the mandible, no skin changes or tenderness Facial Strength: Facial motility symmetric and full bilaterally ENT Pinna: External ear intact and fully developed Neck Neck and Trachea: Midline trachea without mass or lesion Thyroid: No mass or nodularity Lymphatics: No lymphadenopathy  Procedure: None  Studies Reviewed: CT Angio  IMPRESSION: 1. No acute intracranial abnormality. Unchanged moderate chronic small-vessel disease. 2. No large vessel occlusion. 3. 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 4. 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 5. 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 Recommend ENT  referral.  MRI Angio of the neck 04/29/23 FINDINGS: MRI HEAD FINDINGS   Brain: There is a small focus of elevated DWI signal in the right parietal subcortical white matter with faintly elevated FLAIR signal consistent with a small acute to early subacute infarct (15-39). There is a small remote lacunar infarct in the right frontal subcortical white matter with associated T2 shine through at the periphery (15-38, 20-38). There are additional small remote lacunar infarcts infarct in the bilateral centrum semiovale.   There is no acute intracranial hemorrhage or extra-axial fluid collection.   Background parenchymal volume is within expected limits for age. The ventricles are normal in size. Patchy FLAIR signal abnormality in the remainder of the supratentorial white matter is consistent with moderate underlying chronic small-vessel ischemic change.   The  pituitary and suprasellar region are normal. There is no mass lesion. There is no mass effect or midline shift.   Vascular: See below.   Skull and upper cervical spine: Normal marrow signal.   Sinuses/Orbits: The paranasal sinuses are clear. A right lens implant is noted. The globes and orbits are otherwise unremarkable.   Other: The mastoid air cells and middle ear cavities are clear.There is a 1.6 cm x 1.1 cm cystic structure overlying the left parotid gland.   MRA HEAD FINDINGS   Anterior circulation: The intracranial ICAs are patent, without significant stenosis or occlusion.   The bilateral MCAs and ACAS are patent, without proximal stenosis or occlusion.   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   Posterior circulation: The bilateral V4 segments are patent. The basilar artery is patent. The major cerebellar arteries appear patent.   The bilateral PCAs are patent, without proximal stenosis or occlusion. Posterior communicating arteries are not identified   There is no posterior circulation aneurysm.   Anatomic variants: None.   MRA NECK FINDINGS 04/29/23   Aortic arch: The imaged aortic arch is normal. The origins of the major branch vessels appear patent.   Right carotid system: The right common carotid artery is patent. There is motion artifact of the level of the bifurcation, limiting evaluation. There is probable hemodynamically significant stenosis in the proximal internal carotid artery. The distal internal carotid artery is patent. There is no convincing evidence of aneurysm or dissection.   Left carotid system: The left common, internal, and external carotid arteries appear patent, without evidence of hemodynamically significant stenosis or occlusion there is no evidence of dissection or aneurysm.   Vertebral arteries: The vertebral arteries are patent with antegrade flow. There is no evidence of  hemodynamically significant stenosis or occlusion there is no evidence of dissection or aneurysm.   Other: None.   IMPRESSION: 1. 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 2. Small remote lacunar infarcts in the right frontal subcortical white matter and bilateral centrum semiovale with background moderate chronic small-vessel ischemic changes. 3. Patent intracranial vasculature with no proximal stenosis or occlusion. 4. 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 5. Probable hemodynamically significant stenosis at the right ICA bifurcation, suboptimally evaluated due to motion artifact. Consider CTA neck to better evaluate the degree of stenosis. 6. 1.6 cm cystic structure overlying the left parotid gland is indeterminate but favored benign. Recommend correlation with physical exam, with repeat imaging or ENT referral as needed if this lesion grows clinically.  Assessment/Plan: Encounter Diagnoses  Name Primary?   Mass of left parotid gland Yes  Neoplasm of uncertain behavior of parotid gland    History of TIA (transient ischemic attack)    Anticoagulated      77 year old male with history of recent TIA due to significant right-sided carotid stenosis, status post intervention and stenting, history of A-fib, on Eliquis and baby ASA, here for incidentally noted left parotid mass on CT angio done during workup of TIA.  On my exam there is palpable mobile 2 cm left parotid tail mass without overlying skin changes.  Cranial nerve exam is unremarkable.  No history of skin cancers.  No smoking history.  Differential diagnosis includes benign versus malignant neoplasm of salivary gland versus lymphadenopathy. I discussed exam findings and the need for biopsy with the patient.  He reports having redness on the inner portion of the right eye that he noticed this morning, and there is evidence of conjunctival hemorrhage on my exam.  I advised  the patient to discuss with PCP and his ophthalmologist to ensure no further evaluation is needed.   Schedule FNA (biopsy) of the left parotid mass Return after biopsy Discuss right eye conjunctival hemorrhage your PCP and ophthalmology  Update 07/29/23 He had core bx of the left parotid mass with results negative for malignancy. He noticed that the left parotid gland mass seems to be bigger now. No pain, no skin changes, and no facial weakness. Exam with palpable 2-3 cm left parotid mass CN 2-12 intact.   Pathology for left parotid mass core biopsy  A. PAROTID MASS, LEFT, NEEDLE CORE BIOPSY:       Benign cyst wall with oncocytic features consistent with salivary  duct cyst.       Negative for malignancy.   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 Exam without skin changes or evidence of infection, but due to size change, will resume abx and steroids to complete the course. His scan is scheduled for tomorrow. Will review the scan and will call the patient with results.   Ashok Croon, MD Otolaryngology [facility] Phone: [phone] Fax: [phone]    07/31/2023, 8:26 PM

## 2023-07-31 NOTE — Telephone Encounter (Signed)
Patient called and said that the spot has doubled in size since his visit on 07/29/2023. Please advise. Call back [phone]. Patient also said that he is on blood thinners as well

## 2023-08-01 ENCOUNTER — Ambulatory Visit (HOSPITAL_BASED_OUTPATIENT_CLINIC_OR_DEPARTMENT_OTHER)
Admission: RE | Admit: 2023-08-01 | Discharge: 2023-08-01 | Discharge status: Home / Self Care | Payer: PPO | Source: Ambulatory Visit | Attending: Otolaryngology

## 2023-08-01 DIAGNOSIS — K118 Other diseases of salivary glands: Secondary | ICD-10-CM | POA: Diagnosis present

## 2023-08-01 DIAGNOSIS — D37030 Neoplasm of uncertain behavior of the parotid salivary glands: Secondary | ICD-10-CM | POA: Diagnosis present

## 2023-08-01 MED ORDER — IOHEXOL 300 MG/ML  SOLN
75.0000 mL | Freq: Once | INTRAMUSCULAR | Status: AC | PRN
  Administered 2023-08-01: 75 mL via INTRAVENOUS

## 2023-08-03 ENCOUNTER — Telehealth: Payer: Self-pay

## 2023-08-03 NOTE — Telephone Encounter (Signed)
Spoke with pt regarding [employer]. He verbalized concerns regarding the discontinuation of Tamoxifen. He reports he did speak with Dr Darnelle Catalan, his previous oncologist who reassured him if he is taking Eliquis this should be effective and he should finish his 5 years of Tamoxifen.   Spoke with Dr Al Pimple regarding the patient's concern and she is in agreement to continue Tamoxifen since he is taking Eliquis for his TIA.    Pt was made aware of this recommendation via phone and states he still has enough to get him through November & December. He declines offer for MD visit upon his return from [place] and will call or send MyChart message if he has any further concerns.

## 2023-08-05 ENCOUNTER — Telehealth (INDEPENDENT_AMBULATORY_CARE_PROVIDER_SITE_OTHER): Payer: Self-pay

## 2023-08-05 NOTE — Telephone Encounter (Signed)
Spoke to patient, he was asking for his CT results. I called to have them read.  Told the patient that as soon as the results are available we would call him.   He stated on Monday he was doing some exercises with his abs, he went to get up and stated that he could not, balance was off, he saw his PCP and they didn't find anything.  He stated when he gets of fast, or turns his head he feels his balance is off.  He stated he will continue to monitor this.

## 2023-08-06 ENCOUNTER — Telehealth (INDEPENDENT_AMBULATORY_CARE_PROVIDER_SITE_OTHER): Payer: Self-pay | Admitting: Physician Assistant

## 2023-08-06 NOTE — Telephone Encounter (Signed)
Logan Wright called, his results were given. He had no questions relating to the CT findings. He notes that the swelling he had previously has resolved.

## 2023-08-08 ENCOUNTER — Ambulatory Visit (HOSPITAL_BASED_OUTPATIENT_CLINIC_OR_DEPARTMENT_OTHER): Payer: PPO

## 2023-08-24 ENCOUNTER — Ambulatory Visit (INDEPENDENT_AMBULATORY_CARE_PROVIDER_SITE_OTHER): Payer: PPO | Admitting: Otolaryngology

## 2023-08-26 ENCOUNTER — Ambulatory Visit (INDEPENDENT_AMBULATORY_CARE_PROVIDER_SITE_OTHER): Payer: PPO | Admitting: Otolaryngology

## 2023-09-22 ENCOUNTER — Other Ambulatory Visit: Payer: Self-pay | Admitting: Hematology and Oncology

## 2023-09-22 DIAGNOSIS — C50421 Malignant neoplasm of upper-outer quadrant of right male breast: Secondary | ICD-10-CM

## 2023-09-24 ENCOUNTER — Ambulatory Visit: Payer: PPO

## 2023-09-24 ENCOUNTER — Ambulatory Visit
Admission: RE | Admit: 2023-09-24 | Discharge: 2023-09-24 | Disposition: A | Discharge status: Home / Self Care | Payer: PPO | Source: Ambulatory Visit | Attending: Hematology and Oncology | Admitting: Hematology and Oncology

## 2023-09-24 DIAGNOSIS — Z170 Estrogen receptor positive status [ER+]: Secondary | ICD-10-CM

## 2023-09-25 ENCOUNTER — Telehealth: Payer: Self-pay | Admitting: Cardiovascular Disease

## 2023-09-25 ENCOUNTER — Other Ambulatory Visit: Payer: Self-pay | Admitting: Orthopedic Surgery

## 2023-09-25 DIAGNOSIS — M19011 Primary osteoarthritis, right shoulder: Secondary | ICD-10-CM

## 2023-09-25 NOTE — Telephone Encounter (Signed)
   Pre-operative Risk Assessment    Patient Name: Logan Wright  DOB: 1946-03-31 MRN: [id number]      Request for Surgical Clearance    Procedure:  Right Reverse Total Shoulder Replacement  Date of Surgery:  Clearance TBD                                 Surgeon:  Dr Jones Broom Surgeon's Group or Practice Name:   Phone number:  [phone] Fax number:  [phone]   Type of Clearance Requested:   Medical and Medicine    Type of Anesthesia:   Choice   Additional requests/questions:    Signed, Laurence Ferrari   09/25/2023, 2:42 PM

## 2023-09-25 NOTE — Telephone Encounter (Signed)
Pharmacy please advise on holding Eliquis prior to Right Reverse Shoulder Replacement  scheduled for TBD . Thank you.

## 2023-09-26 NOTE — Telephone Encounter (Signed)
Patient with diagnosis of atrial fibrillation on Eliquis for anticoagulation.    Procedure:  Right Reverse Total Shoulder Replacement   Date of Surgery:  Clearance TBD    CHA2DS2-VASc Score = 6   This indicates a 9.7% annual risk of stroke. The patient's score is based upon: CHF History: 0 HTN History: 1 Diabetes History: 0 Stroke History: 2 Vascular Disease History: 1 Age Score: 2 Gender Score: 0    CrCl 53 Platelet count 174  Patient had TIA x 2 in July 2024  - per protocol will defer to MD if CVA/TIA within 6 months    **This guidance is not considered finalized until pre-operative APP has relayed final recommendations.**

## 2023-09-28 ENCOUNTER — Telehealth: Payer: Self-pay | Admitting: *Deleted

## 2023-09-28 ENCOUNTER — Encounter: Payer: Self-pay | Admitting: Orthopedic Surgery

## 2023-09-28 NOTE — Telephone Encounter (Signed)
Pt has been scheduled for a tele visit, 10/12/23, consent on file / medications reconciled.

## 2023-09-28 NOTE — Telephone Encounter (Signed)
Pt has been scheduled for a tele visit, 10/12/23, consent on file / medications reconciled.    Patient Consent for Virtual Visit        Logan Wright has provided verbal consent on 09/28/2023 for a virtual visit (video or telephone).   CONSENT FOR VIRTUAL VISIT FOR:  Logan Wright  By participating in this virtual visit I agree to the following:  I hereby voluntarily request, consent and authorize [facility] and its employed or contracted physicians, physician assistants, nurse practitioners or other licensed health care professionals (the Practitioner), to provide me with telemedicine health care services (the "Services") as deemed necessary by the treating Practitioner. I acknowledge and consent to receive the Services by the Practitioner via telemedicine. I understand that the telemedicine visit will involve communicating with the Practitioner through live audiovisual communication technology and the disclosure of certain medical information by electronic transmission. I acknowledge that I have been given the opportunity to request an in-person assessment or other available alternative prior to the telemedicine visit and am voluntarily participating in the telemedicine visit.  I understand that I have the right to withhold or withdraw my consent to the use of telemedicine in the course of my care at any time, without affecting my right to future care or treatment, and that the Practitioner or I may terminate the telemedicine visit at any time. I understand that I have the right to inspect all information obtained and/or recorded in the course of the telemedicine visit and may receive copies of available information for a reasonable fee.  I understand that some of the potential risks of receiving the Services via telemedicine include:  Delay or interruption in medical evaluation due to technological equipment failure or disruption; Information transmitted may not be sufficient (e.g.  poor resolution of images) to allow for appropriate medical decision making by the Practitioner; and/or  In rare instances, security protocols could fail, causing a breach of personal health information.  Furthermore, I acknowledge that it is my responsibility to provide information about my medical history, conditions and care that is complete and accurate to the best of my ability. I acknowledge that Practitioner's advice, recommendations, and/or decision may be based on factors not within their control, such as incomplete or inaccurate data provided by me or distortions of diagnostic images or specimens that may result from electronic transmissions. I understand that the practice of medicine is not an exact science and that Practitioner makes no warranties or guarantees regarding treatment outcomes. I acknowledge that a copy of this consent can be made available to me via my patient portal [facility] MyChart), or I can request a printed copy by calling the office of [facility].    I understand that my insurance will be billed for this visit.   I have read or had this consent read to me. I understand the contents of this consent, which adequately explains the benefits and risks of the Services being provided via telemedicine.  I have been provided ample opportunity to ask questions regarding this consent and the Services and have had my questions answered to my satisfaction. I give my informed consent for the services to be provided through the use of telemedicine in my medical care

## 2023-09-28 NOTE — Telephone Encounter (Signed)
   Name: NABOR NEESON  DOB: [birth year]  MRN: [id number]  Primary Cardiologist: Kristeen Miss, MD   Preoperative team, please contact this patient and set up a phone call appointment for further preoperative risk assessment. Please obtain consent and complete medication review. Thank you for your help.  I confirm that guidance regarding antiplatelet and oral anticoagulation therapy has been completed and, if necessary, noted below.  Per Dr. Elease Hashimoto, he may hold his Eliquis for 2 days prior to shoulder surgery. Per Dr. Elease Hashimoto and Dr. Lenell Antu ([facility]), pt should continue Aspirin throughout the perioperative period. Please resume Eliquis as soon as possible postprocedure, at the discretion of the surgeon.   I also confirmed the patient resides in the state of [place]. As per [facility] Medical Board telemedicine laws, the patient must reside in the state in which the provider is licensed.   Joylene Grapes, NP 09/28/2023, 11:07 AM [facility]

## 2023-10-05 ENCOUNTER — Inpatient Hospital Stay: Admission: RE | Admit: 2023-10-05 | Payer: PPO | Source: Ambulatory Visit

## 2023-10-12 ENCOUNTER — Ambulatory Visit: Payer: PPO | Attending: Physician Assistant

## 2023-10-12 DIAGNOSIS — Z01810 Encounter for preprocedural cardiovascular examination: Secondary | ICD-10-CM

## 2023-10-12 NOTE — Progress Notes (Signed)
Virtual Visit via Telephone Note   Because of Logan Wright co-morbid illnesses, he is at least at moderate risk for complications without adequate follow up.  This format is felt to be most appropriate for this patient at this time.  The patient did not have access to video technology/had technical difficulties with video requiring transitioning to audio format only (telephone).  All issues noted in this document were discussed and addressed.  No physical exam could be performed with this format.  Please refer to the patient's chart for his consent to telehealth for Logan Wright.  Evaluation Performed:  Preoperative cardiovascular risk assessment _____________   Date:  10/12/2023   Patient ID:  Logan Wright, DOB 1946/03/14, Logan Wright Patient Location:  Home Provider location:   Office  Primary Care Provider:  Jackelyn Poling, DO Primary Cardiologist:  Kristeen Miss, MD  Chief Complaint / Patient Profile   77 y.o. y/o male with a h/o paroxysmal atrial fibrillation, CVA/TIA, carotid stenosis status post right carotid stent 05/14/2023, HTN, HLD, CKD stage III, right breast CA status post right mastectomy, coronary calcifications, aortic dilation who is pending right reverse total shoulder replacement and presents today for telephonic preoperative cardiovascular risk assessment.  History of Present Illness    Logan Wright is a 77 y.o. male who presents via audio/video conferencing for a telehealth visit today.  Pt was last seen in cardiology clinic on 06/16/2023 by Robin Searing, NP.  At that time Logan Wright presented with weakness, fatigue, and chest pain.  ZIO monitor ordered. The patient is now pending procedure as outlined above. Since his last visit, he has been doing great with no chest pains or shortness of breath.  Exercising daily.  Slight cold that he is recovering from.  Blood pressures have been normal (he only 1 blood pressure above 130 systolic).  He does an  hour on the bike and then another hour of a training circuit 4 days a week.  He well exceeds the 4 METS minimum.  Per Dr. Elease Hashimoto, he may hold his Eliquis for 2 days prior to shoulder surgery   Would recommend against ASA being held during surgery. -Dr. 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   Echocardiogram 07/2020: 42 mm   Breast cancer (HCC) 2021   right breast   Breast cancer, right (HCC)    CKD (chronic kidney disease), stage III (HCC)    Colon adenoma 2015   Coronary artery calcification seen on CT scan    Diverticulosis    DENIES    DJD (degenerative joint disease)    Dysrhythmia    A-fib   ED (erectile dysfunction)    Elevated blood pressure 2007-2008   Esophageal reflux    GERD (gastroesophageal reflux disease)    Gout    Gynecomastia    LEFT   Hearing loss    HTN (hypertension)    Hypercholesteremia    Non-cardiac chest pain    OA (osteoarthritis)    LEFT SHOULDER   Onychomycosis of foot with other complication    PAF (paroxysmal atrial fibrillation) (HCC)    Pre-diabetes    Seborrhea    Severe frontal headaches    RESOLVED    Vertigo    RESOLVED    Wears glasses    Wears hearing aid    Past Surgical History:  Procedure Laterality Date  COLONSCOPY      HIP ARTHROPLASTY Bilateral    AT [place]    IR CV LINE INJECTION  05/14/2020   IR IMAGING GUIDED PORT INSERTION  05/17/2020   IR REMOVAL TUN ACCESS W/ PORT W/O FL MOD SED  05/17/2020   KNEE ARTHROSCOPY     UNSURE WHICH KNEE    MASTECTOMY Right 03/16/2020   MASTECTOMY W/ SENTINEL NODE BIOPSY Right 03/16/2020   Procedure: RIGHT BREAST MASTECTOMY WITH SENTINEL LYMPH NODE BIOPSY;  Surgeon: Abigail Miyamoto, MD;  Location: [facility];  Service: General;  Laterality: Right;   PORTACATH PLACEMENT Left 05/08/2020   Procedure: INSERTION PORT-A-CATH WITH ULTRASOUND;  Surgeon:  Abigail Miyamoto, MD;  [facility] OR;  Service: General;  Laterality: Left;  LMA   TOTAL KNEE ARTHROPLASTY Left 11/23/2018   Procedure: TOTAL KNEE ARTHROPLASTY;  Surgeon: Durene Romans, MD;  Location: [facility];  Service: Orthopedics;  Laterality: Left;  70 mins   TRANSCAROTID ARTERY REVASCULARIZATION  Right 05/14/2023   Procedure: Transcarotid Artery Revascularization;  Surgeon: Leonie Douglas, MD;  [facility]: [facility] OR;  Service: Vascular;  Laterality: Right;   ULTRASOUND GUIDANCE FOR VASCULAR ACCESS Left 05/14/2023   Procedure: ULTRASOUND GUIDANCE FOR VASCULAR ACCESS;  Surgeon: Leonie Douglas, MD;  Location[facility] OR;  Service: Vascular;  Laterality: Left;    Allergies  No Known Allergies  Home Medications    Prior to Admission medications   Medication Sig Start Date End Date Taking? Authorizing Provider  allopurinol (ZYLOPRIM) 100 MG tablet Take 200 mg by mouth daily.    [provider]  apixaban (ELIQUIS) 5 MG TABS tablet Take 1 tablet (5 mg total) by mouth 2 (two) times daily. Hold this medication 48 prior to vascular surgery. 05/06/23   Azucena Fallen, MD  aspirin EC 81 MG tablet Take 81 mg by mouth daily. Swallow whole.    [provider]  atorvastatin (LIPITOR) 80 MG tablet Take 80 mg by mouth at bedtime.    [provider]  diltiazem (CARDIZEM CD) 180 MG 24 hr capsule Take 180 mg by mouth daily.    [provider]  empagliflozin (JARDIANCE) 10 MG TABS tablet Take 10 mg by mouth daily.    [provider]  GLUCOSAMINE-CHONDROITIN PO Take 2 tablets by mouth daily.    [provider]  Multiple Vitamin (MULTIVITAMIN) tablet Take 1 tablet by mouth daily.     [provider]  Omega-3 Fatty Acids (FISH OIL ULTRA) 1400 MG CAPS Take 1,400-2,800 mg by mouth See admin instructions. Take 1400 mg in the morning and 2800 mg in the evening    [provider]  Potassium 99 MG TABS Take 1 tablet by mouth daily.    [provider]  tamoxifen (NOLVADEX) 20 MG tablet Take 20 mg by mouth daily.    [provider]    Physical Exam    Vital Signs:  Logan Wright does not have vital signs available for review today.  Given telephonic nature of communication, physical exam is limited. AAOx3. NAD. Normal affect.  Speech and respirations are unlabored.  Accessory Clinical Findings    None  Assessment & Plan    1.  Preoperative Cardiovascular Risk Assessment:  Mr. Rudenko perioperative risk of a major cardiac event is 0.9% according to the Revised Cardiac Risk Index (RCRI).  Therefore, he is at low risk for perioperative complications.   His functional capacity is good at 5.07 METs according to the [facility] Activity  Status Index (DASI). Recommendations: According to ACC/AHA guidelines, no further cardiovascular testing needed.  The patient may proceed to surgery at acceptable risk.   Antiplatelet and/or Anticoagulation Recommendations: The patient should remain on Aspirin without interruption.   Eliquis (Apixaban) can be held for 2 days prior to surgery.  Please resume post op when felt to be safe.     Time:   Today, I have spent 15 minutes with the patient with telehealth technology discussing medical history, symptoms, and management plan.     Sharlene Dory, PA-C  10/12/2023, 1:26 PM

## 2023-10-13 ENCOUNTER — Ambulatory Visit
Admission: RE | Admit: 2023-10-13 | Discharge: 2023-10-13 | Disposition: A | Discharge status: Home / Self Care | Payer: PPO | Source: Ambulatory Visit | Attending: Orthopedic Surgery | Admitting: Orthopedic Surgery

## 2023-10-13 DIAGNOSIS — M19011 Primary osteoarthritis, right shoulder: Secondary | ICD-10-CM

## 2023-10-14 ENCOUNTER — Other Ambulatory Visit: Payer: Self-pay | Admitting: Cardiovascular Disease

## 2023-10-14 DIAGNOSIS — I480 Paroxysmal atrial fibrillation: Secondary | ICD-10-CM

## 2023-10-15 NOTE — Telephone Encounter (Signed)
 Prescription refill request for Eliquis received. Indication: Afib  Last office visit: 10/12/23 Scr: 1.27 (05/15/23)  Age: 78 Weight: 78kg  Appropriate dose. Refill sent.

## 2023-10-29 ENCOUNTER — Telehealth: Payer: Self-pay | Admitting: Neurology

## 2023-10-29 NOTE — Telephone Encounter (Signed)
Received a surgical clearance form for the patient to proceed forward with right reverse total shoulder arthroplasty.  Patient suffered stroke like symptoms which he went to the ER. He had underwent an MRI scan of the brain ordered by his primary physician on 04/30/2022 which showed a small right parietal white matter early subacute lacunar infarct as well as old lacunar infarcts in right frontal white matter and bilateral centrum semiovale and moderate changes of chronic small vessel disease. A small incidental left cavernous ICA aneurysm was noted. CT angiogram suggested high-grade stenosis of proximal right carotid close to its origin. Patient was referred to vascular surgery and underwent interval right carotid revascularization with the TCAR procedure by Dr. Lenell Antu on 05/14/2023 uneventfully.  I saw Mr. Talburt in a office visit as a follow up 06/02/2023 and at this time he was stable and doing well.  Mr. Alamillo has been on eliquis for several years for Atrial fibrillation and this is prescribed by cardiologist. The clearance to hold the medication should come from the cardiologist but from a neurological standpoint he would be cleared to undergo surgery since the stroke is > 6 months out.

## 2023-11-02 NOTE — Telephone Encounter (Signed)
Surgical clearance form faxed to [facility] at [phone]

## 2023-12-11 DIAGNOSIS — M25511 Pain in right shoulder: Secondary | ICD-10-CM | POA: Diagnosis not present

## 2023-12-14 DIAGNOSIS — M25511 Pain in right shoulder: Secondary | ICD-10-CM | POA: Diagnosis not present

## 2023-12-17 DIAGNOSIS — M25511 Pain in right shoulder: Secondary | ICD-10-CM | POA: Diagnosis not present

## 2023-12-21 DIAGNOSIS — M25511 Pain in right shoulder: Secondary | ICD-10-CM | POA: Diagnosis not present

## 2023-12-22 ENCOUNTER — Encounter: Payer: Self-pay | Admitting: Cardiovascular Disease

## 2023-12-22 NOTE — Progress Notes (Unsigned)
 Cardiology Office Note:    Date:  12/23/2023   ID:  Logan Wright, DOB 02-23-1946, MRN [id number]  PCP:  Jackelyn Poling, DO  Cardiologist:  Kristeen Miss, MD  Electrophysiologist:  None   Referring MD: Jackelyn Poling, DO   Chief Complaint  Patient presents with   Atrial Fibrillation     Feb. 10, 2020    Logan Wright is a 78 y.o. male with a hx of hypertension and atypical chest pain.  Asked to see him today for preoperative evaluation  By Dr. Charlann Boxer prior to left total knee replacement.  Has seen Dr. Katrinka Blazing in the past ( at his request)  Now he was told that he needs cardiac clearence.   Exercises regularly .   Aerobic and weight lifting . Does a boot camp several times a week.  He averages exercising 5 times a week. Recently has been having some left knee problems so he is been cycling more.  Not any chest pain or shortness of breath while exercising.  No syncope or presyncope. He has held his aspirin for the past 4 days. Has also been taking Turmeric.     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   Jan. 27, 2022: Logan Wright is seen back today for follow up of his HTN and atrial fib.   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  Was diagnosed with atrial fib .   Was treated with rate control and anticoagulation.  He did not have a cardioversion but converted on his own to NSR   We had referred him for a sleep study - we had tried to refer him to Dr. Mayford Knife .   But he never heard back from her .  He has contacted Benjaman Kindler and will be doing the home sleep study.  Has lost 30 lbs . Does not have any questions   CHADS2VASC is 30 ( age, HTN)    Seen back today for follow-up of his hypertension and atrial fibrillation.  He status post mastectomy for right breast cancer.  Getting lots of exercise   Hiked out in [place] recently . [place] .  No cp, no dyspnea. Lipids from his primary MD look good LDL = 78 HDL = 42 Trigs =  93 Chol = 138    May 22 2023: Logan Wright is seen for follow-up of his paroxysmal atrial fibrillation, hypertension. HR has been slower .  Has felt foggy over the past week  Diltiazem was stopped .   HR has been going up since he stopped the dilt   Had a recent right carotid stent  Is on Eliquis, ASA, plavix   Works out on recumbant bike for 45 min - 1 Hr.    1 min step test HR increased from 50 to 92 in 1 min of stepping up .    December 23, 2023 Logan Wright is seen follow up of his PAF Cannot always tell when he is in PAF  Has not had any recent episodes   He has a soft systolic murmur on exam.  On echo he has aortic valve calcification, trace mitral regurgitation, trace tricuspid regurgitation.  In of these could be contributing to the systolic murmur.  He was also found to have mild dilatation of the ascending aorta. 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   Echocardiogram 07/2020: 42 mm   Breast cancer (HCC) 2021   right breast   Breast cancer, right (HCC)    CKD (chronic kidney disease), stage III (HCC)    Colon adenoma 2015   Coronary artery calcification seen on CT scan    Diverticulosis    DENIES    DJD (degenerative joint disease)    Dysrhythmia    A-fib   ED (erectile dysfunction)    Elevated blood pressure 2007-2008   Esophageal reflux    GERD (gastroesophageal reflux disease)    Gout    Gynecomastia    LEFT   Hearing loss    HTN (hypertension)    Hypercholesteremia    Non-cardiac chest pain    OA (osteoarthritis)    LEFT SHOULDER   Onychomycosis of foot with other complication    PAF (paroxysmal atrial fibrillation) (HCC)    Pre-diabetes    Seborrhea    Severe frontal headaches    RESOLVED    Vertigo    RESOLVED    Wears glasses    Wears hearing aid     Past  Surgical History:  Procedure Laterality Date   COLONSCOPY      HIP ARTHROPLASTY Bilateral    AT [place]    IR CV LINE INJECTION  05/14/2020   IR IMAGING GUIDED PORT INSERTION  05/17/2020   IR REMOVAL TUN ACCESS W/ PORT W/O FL MOD SED  05/17/2020   KNEE ARTHROSCOPY     UNSURE WHICH KNEE    MASTECTOMY Right 03/16/2020   MASTECTOMY W/ SENTINEL NODE BIOPSY Right 03/16/2020   Procedure: RIGHT BREAST MASTECTOMY WITH SENTINEL LYMPH NODE BIOPSY;  Surgeon: Abigail Miyamoto, MD;  Location: [facility];  Service: General;  Laterality: Right;   PORTACATH PLACEMENT Left 05/08/2020   Procedure: INSERTION PORT-A-CATH WITH ULTRASOUND;  Surgeon: Abigail Miyamoto, MD;  Location: [facility] OR;  Service: General;  Laterality: Left;  LMA   TOTAL KNEE ARTHROPLASTY Left 11/23/2018   Procedure: TOTAL KNEE ARTHROPLASTY;  Surgeon: Durene Romans, MD;  Location: [facility];  Service: Orthopedics;  Laterality: Left;  70 mins   TRANSCAROTID ARTERY REVASCULARIZATION  Right 05/14/2023   Procedure: Transcarotid Artery Revascularization;  Surgeon: Leonie Douglas, MD;  Location: [facility] OR;  Service: Vascular;  Laterality: Right;   ULTRASOUND GUIDANCE FOR VASCULAR ACCESS Left 05/14/2023   Procedure: ULTRASOUND GUIDANCE FOR VASCULAR ACCESS;  Surgeon: Leonie Douglas, MD;  Location: [facility] OR;  Service: Vascular;  Laterality: Left;    Current Medications: Current Meds  Medication Sig   allopurinol (ZYLOPRIM) 100 MG tablet Take 200 mg by mouth daily.   apixaban (ELIQUIS) 5 MG TABS tablet Take 1 tablet by mouth twice daily   aspirin EC 81 MG tablet Take 81 mg by mouth daily. Swallow whole.   atorvastatin (LIPITOR) 80 MG tablet Take 80 mg by mouth at bedtime.   diltiazem (CARDIZEM CD) 180 MG 24 hr capsule Take 180 mg by mouth daily.   empagliflozin (JARDIANCE) 10 MG TABS tablet Take 10 mg by mouth daily.   GLUCOSAMINE-CHONDROITIN PO Take 2 tablets by mouth daily.   latanoprost (XALATAN) 0.005 % ophthalmic solution Place 1  drop into both eyes at bedtime.   Multiple Vitamin (MULTIVITAMIN) tablet Take 1 tablet by mouth daily.    Omega-3 Fatty Acids (FISH OIL ULTRA) 1400 MG CAPS Take 1,400-2,800 mg by mouth See  admin instructions. Take 1400 mg in the morning and 2800 mg in the evening   Potassium 99 MG TABS Take 1 tablet by mouth daily.   tamoxifen (NOLVADEX) 20 MG tablet Take 20 mg by mouth daily.     Allergies:   Patient has no known allergies.   Social History   Socioeconomic History   Marital status: Married    Spouse name: Not on file   Number of children: Not on file   Years of education: Not on file   Highest education level: Not on file  Occupational History   Not on file  Tobacco Use   Smoking status: Never   Smokeless tobacco: Never  Vaping Use   Vaping status: Never Used  Substance and Sexual Activity   Alcohol use: No    Alcohol/week: 0.0 standard drinks of alcohol   Drug use: No   Sexual activity: Not on file  Other Topics Concern   Not on file  Social History Narrative   Not on file   Social Drivers of Health   Financial Resource Strain: Not on file  Food Insecurity: No Food Insecurity (05/05/2023)   Hunger Vital Sign    Worried About Running Out of Food in the Last Year: Never true    Ran Out of Food in the Last Year: Never true  Transportation Needs: No Transportation Needs (05/05/2023)   PRAPARE - Administrator, Civil Service (Medical): No    Lack of Transportation (Non-Medical): No  Physical Activity: Not on file  Stress: Not on file  Social Connections: Not on file     Family History: The patient's family history includes Heart attack in his mother; Heart failure in his father; Stroke in his mother. There is no history of Hypertension or Breast cancer.  ROS:   Please see the history of present illness.     All other systems reviewed and are negative.  EKGs/Labs/Other Studies Reviewed:    The following studies were reviewed today:   EKG:           Recent Labs: 05/14/2023: ALT 28 05/15/2023: BUN 21; Creatinine, Ser 1.27; Hemoglobin 10.6; Platelets 174; Potassium 3.9; Sodium 137  Recent Lipid Panel    Component Value Date/Time   CHOL 130 05/06/2023 0047   TRIG 88 05/06/2023 0047   HDL 36 (L) 05/06/2023 0047   CHOLHDL 3.6 05/06/2023 0047   VLDL 18 05/06/2023 0047   LDLCALC 76 05/06/2023 0047    Physical Exam:     Physical Exam: Blood pressure 130/64, pulse 60, height 5\' 7"  (1.702 m), weight 182 lb 3.2 oz (82.6 kg), SpO2 95%.       GEN:  Well nourished, well developed in no acute distress HEENT: Normal NECK: No JVD; No carotid bruits LYMPHATICS: No lymphadenopathy CARDIAC: RRR 1-2/6 systolic murmur,  no  rubs, gallops RESPIRATORY:  Clear to auscultation without rales, wheezing or rhonchi  ABDOMEN: Soft, non-tender, non-distended MUSCULOSKELETAL:  No edema; No deformity  SKIN: Warm and dry NEUROLOGIC:  Alert and oriented x 3    ASSESSMENT:    1. Ascending aorta dilation (HCC)   2. Coronary artery calcification seen on CT scan   3. Paroxysmal atrial fibrillation (HCC)   4. Pure hypercholesterolemia      PLAN:       HTN:         Blood pressure is well-controlled.  Continue current medications.  2.  Atrial fib :    He is not aware of any  other episodes of atrial fibrillation.  3.  Sinus bradycardia:   Heart rate is slow at baseline.  He does have some occasional episodes of orthostatic hypotension but otherwise is stable.        Medication Adjustments/Labs and Tests Ordered: Current medicines are reviewed at length with the patient today.  Concerns regarding medicines are outlined above.  No orders of the defined types were placed in this encounter.  No orders of the defined types were placed in this encounter.   Patient Instructions  Follow-Up: At [facility], you and your health needs are our priority.  As part of our continuing mission to provide you with exceptional heart care, we  have created designated Provider Care Teams.  These Care Teams include your primary Cardiologist (physician) and Advanced Practice Providers (APPs -  Physician Assistants and Nurse Practitioners) who all work together to provide you with the care you need, when you need it.  Your next appointment:   1 year(s)  Provider:   Kristeen Miss, MD      1st Floor: - Lobby - Registration  - Pharmacy  - Lab - Cafe  2nd Floor: - PV Lab - Diagnostic Testing (echo, CT, nuclear med)  3rd Floor: - Vacant  4th Floor: - TCTS (cardiothoracic surgery) - AFib Clinic - Structural Heart Clinic - Vascular Surgery  - Vascular Ultrasound  5th Floor: - HeartCare Cardiology (general and EP) - Clinical Pharmacy for coumadin, hypertension, lipid, weight-loss medications, and med management appointments    Valet parking services will be available as well.     Signed, Kristeen Miss, MD  12/23/2023 5:03 PM    [facility]

## 2023-12-23 ENCOUNTER — Ambulatory Visit: Payer: PPO | Attending: Cardiovascular Disease | Admitting: Cardiovascular Disease

## 2023-12-23 ENCOUNTER — Encounter: Payer: Self-pay | Admitting: Cardiovascular Disease

## 2023-12-23 VITALS — BP 130/64 | HR 60 | Ht 67.0 in | Wt 182.2 lb

## 2023-12-23 DIAGNOSIS — I480 Paroxysmal atrial fibrillation: Secondary | ICD-10-CM

## 2023-12-23 DIAGNOSIS — I77810 Thoracic aortic ectasia: Secondary | ICD-10-CM

## 2023-12-23 DIAGNOSIS — I2510 Atherosclerotic heart disease of native coronary artery without angina pectoris: Secondary | ICD-10-CM

## 2023-12-23 DIAGNOSIS — E7800 Pure hypercholesterolemia, unspecified: Secondary | ICD-10-CM | POA: Diagnosis not present

## 2023-12-23 NOTE — Patient Instructions (Signed)

## 2023-12-24 DIAGNOSIS — M25511 Pain in right shoulder: Secondary | ICD-10-CM | POA: Diagnosis not present

## 2023-12-26 ENCOUNTER — Other Ambulatory Visit: Payer: Self-pay | Admitting: Hematology and Oncology

## 2023-12-26 ENCOUNTER — Encounter: Payer: Self-pay | Admitting: Hematology and Oncology

## 2023-12-28 ENCOUNTER — Encounter: Payer: Self-pay | Admitting: Oncology

## 2023-12-28 DIAGNOSIS — M25511 Pain in right shoulder: Secondary | ICD-10-CM | POA: Diagnosis not present

## 2024-01-01 DIAGNOSIS — M25511 Pain in right shoulder: Secondary | ICD-10-CM | POA: Diagnosis not present

## 2024-01-04 DIAGNOSIS — M25511 Pain in right shoulder: Secondary | ICD-10-CM | POA: Diagnosis not present

## 2024-01-07 DIAGNOSIS — M25511 Pain in right shoulder: Secondary | ICD-10-CM | POA: Diagnosis not present

## 2024-01-08 DIAGNOSIS — H401231 Low-tension glaucoma, bilateral, mild stage: Secondary | ICD-10-CM | POA: Diagnosis not present

## 2024-01-11 ENCOUNTER — Telehealth: Payer: Self-pay | Admitting: Cardiovascular Disease

## 2024-01-11 DIAGNOSIS — M19011 Primary osteoarthritis, right shoulder: Secondary | ICD-10-CM | POA: Diagnosis not present

## 2024-01-11 DIAGNOSIS — M25511 Pain in right shoulder: Secondary | ICD-10-CM | POA: Diagnosis not present

## 2024-01-11 NOTE — Telephone Encounter (Signed)
   Pre-operative Risk Assessment    Patient Name: Logan Wright  DOB: 1945-11-07 MRN: [id number]   Date of last office visit: 12/23/2023  Date of next office visit: none   Request for Surgical Clearance    Procedure:   right reverse total shoulder arthroplasty with augmented glenoid.  Date of Surgery:  Clearance TBD                                Surgeon:  Dr. Jones Broom Surgeon's Group or Practice Name:  [facility] Phone number:  [phone] Fax number:  [phone]   Type of Clearance Requested:   - Medical  - Pharmacy:  Hold Aspirin and Apixaban (Eliquis) need instruction   Type of Anesthesia:   choice   Additional requests/questions:    Sharen Hones   01/11/2024, 3:16 PM

## 2024-01-12 ENCOUNTER — Encounter: Payer: Self-pay | Admitting: *Deleted

## 2024-01-12 NOTE — Progress Notes (Unsigned)
 Received fax from [facility] for preoperative risk assessment for right total shoulder arthroplasty with augmented glenoid. Per MD pt to hold Tamoxifen 1 week prior and resume 2 weeks post surgery.

## 2024-01-12 NOTE — Telephone Encounter (Signed)
 Patient with diagnosis of Afib on Eliquis for anticoagulation.    Procedure:  right reverse total shoulder arthroplasty with augmented glenoid  Date of procedure: TBD   CHA2DS2-VASc Score = 6   This indicates a 9.7% annual risk of stroke. The patient's score is based upon: CHF History: 0 HTN History: 1 Diabetes History: 0 Stroke History: 2 Vascular Disease History: 1 Age Score: 2 Gender Score: 0     CrCl 49 mL/min (Adj BW Sr 1.27 05/15/2023) Platelet count 174  K (05/15/2023)  Patient had TIA x 2 in July 2024.Had carotid stenting in Aug. 2024 by Dr. Lenell Antu. Previously 2 days hold on Eliquis cleared by Dr. Elease Hashimoto in Dec, 2024 for shoulder surgery   patient can hold Eliquis for 2 days prior to procedure.  Please resume Eliquis as soon as possible postprocedure, at the discretion of the surgeon    **This guidance is not considered finalized until pre-operative APP has relayed final recommendations.**

## 2024-01-13 DIAGNOSIS — Z860100 Personal history of colon polyps, unspecified: Secondary | ICD-10-CM | POA: Diagnosis not present

## 2024-01-13 DIAGNOSIS — I4891 Unspecified atrial fibrillation: Secondary | ICD-10-CM | POA: Diagnosis not present

## 2024-01-13 NOTE — Telephone Encounter (Signed)
   Patient Name: Logan Wright  DOB: 11/25/1945 MRN: [id number]  Primary Cardiologist: Kristeen Miss, MD  Chart reviewed as part of pre-operative protocol coverage. Given past medical history and time since last visit, based on ACC/AHA guidelines, Logan Wright is at acceptable risk for the planned procedure without further cardiovascular testing.   The patient was advised that if he develops new symptoms prior to surgery to contact our office to arrange for a follow-up visit, and he verbalized understanding.  Patient can hold Eliquis for 2 days prior to procedure.  Please resume Eliquis as soon as possible postprocedure, at the discretion of the surgeon   Aspirin is prescribed by vascular surgery.  Please reach out to vascular surgery for recommendations on aspirin hold.  I will route this recommendation to the requesting party via Epic fax function and remove from pre-op pool.  Please call with questions.  Denyce Robert, NP 01/13/2024, 9:04 AM

## 2024-01-22 DIAGNOSIS — M25511 Pain in right shoulder: Secondary | ICD-10-CM | POA: Diagnosis not present

## 2024-01-26 DIAGNOSIS — M25511 Pain in right shoulder: Secondary | ICD-10-CM | POA: Diagnosis not present

## 2024-01-27 ENCOUNTER — Other Ambulatory Visit: Payer: Self-pay | Admitting: Orthopedic Surgery

## 2024-01-29 DIAGNOSIS — M25511 Pain in right shoulder: Secondary | ICD-10-CM | POA: Diagnosis not present

## 2024-02-08 DIAGNOSIS — M25511 Pain in right shoulder: Secondary | ICD-10-CM | POA: Diagnosis not present

## 2024-02-11 DIAGNOSIS — M25511 Pain in right shoulder: Secondary | ICD-10-CM | POA: Diagnosis not present

## 2024-02-12 DIAGNOSIS — M25511 Pain in right shoulder: Secondary | ICD-10-CM | POA: Diagnosis not present

## 2024-02-14 ENCOUNTER — Encounter: Payer: Self-pay | Admitting: Vascular Surgery

## 2024-02-15 ENCOUNTER — Telehealth: Payer: Self-pay

## 2024-02-15 DIAGNOSIS — M25511 Pain in right shoulder: Secondary | ICD-10-CM | POA: Diagnosis not present

## 2024-02-15 NOTE — Telephone Encounter (Signed)
 Patient contacted [facility] through Patient Advice portal. Patient wanting to know if he can stop his Aspirin  81 mg for his upcoming shoulder surgery. Patient was told to have his shoulder surgeon send a clearance form to [facility] via fax.  Patient agreed to have the form faxed.

## 2024-02-17 ENCOUNTER — Encounter: Payer: Self-pay | Admitting: Hematology and Oncology

## 2024-02-17 DIAGNOSIS — I4891 Unspecified atrial fibrillation: Secondary | ICD-10-CM | POA: Diagnosis not present

## 2024-02-17 DIAGNOSIS — Z01818 Encounter for other preprocedural examination: Secondary | ICD-10-CM | POA: Diagnosis not present

## 2024-02-17 DIAGNOSIS — R809 Proteinuria, unspecified: Secondary | ICD-10-CM | POA: Diagnosis not present

## 2024-02-17 NOTE — Care Plan (Signed)
 Ortho Bundle Case Management Note  Patient Details  Name: Logan Wright MRN: [id number] Date of Birth: 1946/06/26   Spoke with patient prior to surgery. He will discharge to home with family to assist. No DME needed. OPPT set up with [facility] [facility]. Patient and MD in agreement. Choice offered.                   DME Arranged:    DME Agency:     HH Arranged:    HH Agency:     Additional Comments: Please contact me with any questions of if this plan should need to change.  Cornelia Dieter,  RN,BSN,MHA,CCM  [facility]  [phone] 02/17/2024, 10:19 AM

## 2024-02-19 DIAGNOSIS — M25511 Pain in right shoulder: Secondary | ICD-10-CM | POA: Diagnosis not present

## 2024-02-22 DIAGNOSIS — M25511 Pain in right shoulder: Secondary | ICD-10-CM | POA: Diagnosis not present

## 2024-02-25 NOTE — Progress Notes (Addendum)
 COVID Vaccine received:  []  No [x]  Yes Date of any COVID positive Test in last 90 days: no PCP - Mordechai April DO Cardiologist - Freddy Jain MD  Chest x-ray -  EKG -  05/15/23 EPIC Stress Test - 08/17/19 ECHO - 04/16/23 EPIC Cardiac Cath -   Cardiac clearance 01/13/24 Palmer Bobo NP  Bowel Prep - [x]  No  []   Yes ______  Pacemaker / ICD device [x]  No []  Yes   Spinal Cord Stimulator:[x]  No []  Yes       History of Sleep Apnea? [x]  No []  Yes   CPAP used?- [x]  No []  Yes    Does the patient monitor blood sugar?          [x]  No []  Yes  []  N/A  Patient has: [x]  NO Hx DM   []  Pre-DM                 []  DM1  []   DM2 Does patient have a [employer] or Dexacom? []  No []  Yes   Fasting Blood Sugar Ranges-  Checks Blood Sugar _____ times a day  GLP1 agonist / usual dose - no GLP1 instructions:  SGLT-2 inhibitors / usual dose -no SGLT-2 instructions:   Blood Thinner / Instructions:Eliquis - Hold for 2 days PTS per cardiology Last dose to be Mar 07, 2024.evening dose Aspirin  Instructions:81 mg ASA may be continued.  Comments:   Activity level: Patient is able  to climb a flight of stairs without difficulty; [x]  No CP  [x]  No SOB,    Patient can  perform ADLs without assistance.   Anesthesia review: HTN, A-fib, TIA 04/2023, CKD, Carotid Stent  Patient denies shortness of breath, fever, cough and chest pain at PAT appointment.  Patient verbalized understanding and agreement to the Pre-Surgical Instructions that were given to them at this PAT appointment. Patient was also educated of the need to review these PAT instructions again prior to his/her surgery.I reviewed the appropriate phone numbers to call if they have any and questions or concerns.

## 2024-02-25 NOTE — Patient Instructions (Signed)
 SURGICAL WAITING ROOM VISITATION  Patients having surgery or a procedure may have no more than 2 support people in the waiting area - these visitors may rotate.    Children under the age of 68 must have an adult with them who is not the patient.  Due to an increase in RSV and influenza rates and associated hospitalizations, children ages 13 and under may not visit patients in [facility] hospitals.  Visitors with respiratory illnesses are discouraged from visiting and should remain at home.  If the patient needs to stay at the hospital during part of their recovery, the visitor guidelines for inpatient rooms apply. Pre-op nurse will coordinate an appropriate time for 1 support person to accompany patient in pre-op.  This support person may not rotate.    Please refer to the [facility] website for the visitor guidelines for Inpatients (after your surgery is over and you are in a regular room).       Your procedure is scheduled on: 03/10/24   Report to [facility] Main Entrance    Report to admitting at 5:15 AM   Call this number if you have problems the morning of surgery [phone]   Do not eat food :After Midnight.   After Midnight you may have the following liquids until 4:30 AM  DAY OF SURGERY  Water  Non-Citrus Juices (without pulp, NO RED-Apple, White grape, White cranberry) Black Coffee (NO MILK/CREAM OR CREAMERS, sugar ok)  Clear Tea (NO MILK/CREAM OR CREAMERS, sugar ok) regular and decaf                             Plain Jell-O (NO RED)                                           Fruit ices (not with fruit pulp, NO RED)                                     Popsicles (NO RED)                                                               Sports drinks like Gatorade (NO RED)                  The day of surgery:  Drink ONE (1) Pre-Surgery G2 at 4:30 AM the morning of surgery. Drink in one sitting. Do not sip.  This drink was given to you during your hospital   pre-op appointment visit. Nothing else to drink after completing the  Pre-Surgery G2.     Oral Hygiene is also important to reduce your risk of infection.                                    Remember - BRUSH YOUR TEETH THE MORNING OF SURGERY WITH YOUR REGULAR TOOTHPASTE  DENTURES WILL BE REMOVED PRIOR TO SURGERY PLEASE DO NOT APPLY "Poly grip" OR ADHESIVES!!!   Stop all vitamins  and herbal supplements 7 days before surgery.   Take these medicines the morning of surgery with A SIP OF WATER : allopurinol , atorvastatin , diltiazem                Last dose of Eliquis  to be May 26 evening dose. May continue Aspirin .  DO NOT TAKE ANY ORAL DIABETIC MEDICATIONS DAY OF YOUR SURGERY Hold Jardiance for 72 hours prior to surgery. Last dose to be 03/06/24             You may not have any metal on your body including hair pins, jewelry, and body piercing             Do not wear make-up, lotions, powders, perfumes/cologne, or deodorant              Men may shave face and neck.   Do not bring valuables to the hospital.  IS NOT             RESPONSIBLE   FOR VALUABLES.   Contacts, glasses, dentures or bridgework may not be worn into surgery.  DO NOT BRING YOUR HOME MEDICATIONS TO THE HOSPITAL. PHARMACY WILL DISPENSE MEDICATIONS LISTED ON YOUR MEDICATION LIST TO YOU DURING YOUR ADMISSION IN THE HOSPITAL!    Patients discharged on the day of surgery will not be allowed to drive home.  Someone NEEDS to stay with you for the first 24 hours after anesthesia.   Special Instructions: Bring a copy of your healthcare power of attorney and living will documents the day of surgery if you haven't scanned them before.              Please read over the following fact sheets you were given: IF YOU HAVE QUESTIONS ABOUT YOUR PRE-OP INSTRUCTIONS PLEASE CALL [phone] Logan Wright   If you received a COVID test during your pre-op visit  it is requested that you wear a mask when out in public, stay away from  anyone that may not be feeling well and notify your surgeon if you develop symptoms. If you test positive for Covid or have been in contact with anyone that has tested positive in the last 10 days please notify you surgeon.      Pre-operative 5 CHG Bath Instructions   You can play a key role in reducing the risk of infection after surgery. Your skin needs to be as free of germs as possible. You can reduce the number of germs on your skin by washing with CHG (chlorhexidine  gluconate) soap before surgery. CHG is an antiseptic soap that kills germs and continues to kill germs even after washing.   DO NOT use if you have an allergy to chlorhexidine /CHG or antibacterial soaps. If your skin becomes reddened or irritated, stop using the CHG and notify one of our RNs at [phone].   Please shower with the CHG soap starting 4 days before surgery using the following schedule:     Please keep in mind the following:  DO NOT shave, including legs and underarms, starting the day of your first shower.   You may shave your face at any point before/day of surgery.  Place clean sheets on your bed the day you start using CHG soap. Use a clean washcloth (not used since being washed) for each shower. DO NOT sleep with pets once you start using the CHG.   CHG Shower Instructions:  If you choose to wash your hair and private area, wash first with your normal shampoo/soap.  After you  use shampoo/soap, rinse your hair and body thoroughly to remove shampoo/soap residue.  Turn the water  OFF and apply about 3 tablespoons (45 ml) of CHG soap to a CLEAN washcloth.  Apply CHG soap ONLY FROM YOUR NECK DOWN TO YOUR TOES (washing for 3-5 minutes)  DO NOT use CHG soap on face, private areas, open wounds, or sores.  Pay special attention to the area where your surgery is being performed.  If you are having back surgery, having someone wash your back for you may be helpful. Wait 2 minutes after CHG soap is applied, then  you may rinse off the CHG soap.  Pat dry with a clean towel  Put on clean clothes/pajamas   If you choose to wear lotion, please use ONLY the CHG-compatible lotions on the back of this paper.     Additional instructions for the day of surgery: DO NOT APPLY any lotions, deodorants, cologne, or perfumes.   Put on clean/comfortable clothes.  Brush your teeth.  Ask your nurse before applying any prescription medications to the skin.      CHG Compatible Lotions   Aveeno Moisturizing lotion  Cetaphil Moisturizing Cream  Cetaphil Moisturizing Lotion  Clairol Herbal Essence Moisturizing Lotion, Dry Skin  Clairol Herbal Essence Moisturizing Lotion, Extra Dry Skin  Clairol Herbal Essence Moisturizing Lotion, Normal Skin  Curel Age Defying Therapeutic Moisturizing Lotion with Alpha Hydroxy  Curel Extreme Care Body Lotion  Curel Soothing Hands Moisturizing Hand Lotion  Curel Therapeutic Moisturizing Cream, Fragrance-Free  Curel Therapeutic Moisturizing Lotion, Fragrance-Free  Curel Therapeutic Moisturizing Lotion, Original Formula  Eucerin Daily Replenishing Lotion  Eucerin Dry Skin Therapy Plus Alpha Hydroxy Crme  Eucerin Dry Skin Therapy Plus Alpha Hydroxy Lotion  Eucerin Original Crme  Eucerin Original Lotion  Eucerin Plus Crme Eucerin Plus Lotion  Eucerin TriLipid Replenishing Lotion  Keri Anti-Bacterial Hand Lotion  Keri Deep Conditioning Original Lotion Dry Skin Formula Softly Scented  Keri Deep Conditioning Original Lotion, Fragrance Free Sensitive Skin Formula  Keri Lotion Fast Absorbing Fragrance Free Sensitive Skin Formula  Keri Lotion Fast Absorbing Softly Scented Dry Skin Formula  Keri Original Lotion  Keri Skin Renewal Lotion Keri Silky Smooth Lotion  Keri Silky Smooth Sensitive Skin Lotion  Nivea Body Creamy Conditioning Oil  Nivea Body Extra Enriched Lotion  Nivea Body Original Lotion  Nivea Body Sheer Moisturizing Lotion Nivea Crme  Nivea Skin Firming Lotion   NutraDerm 30 Skin Lotion  NutraDerm Skin Lotion  NutraDerm Therapeutic Skin Cream  NutraDerm Therapeutic Skin Lotion  ProShield Protective Hand Cream   Incentive Spirometer  An incentive spirometer is a tool that can help keep your lungs clear and active. This tool measures how well you are filling your lungs with each breath. Taking long deep breaths may help reverse or decrease the chance of developing breathing (pulmonary) problems (especially infection) following: A long period of time when you are unable to move or be active. BEFORE THE PROCEDURE  If the spirometer includes an indicator to show your best effort, your nurse or respiratory therapist will set it to a desired goal. If possible, sit up straight or lean slightly forward. Try not to slouch. Hold the incentive spirometer in an upright position. INSTRUCTIONS FOR USE  Sit on the edge of your bed if possible, or sit up as far as you can in bed or on a chair. Hold the incentive spirometer in an upright position. Breathe out normally. Place the mouthpiece in your mouth and seal your lips tightly around it.  Breathe in slowly and as deeply as possible, raising the piston or the ball toward the top of the column. Hold your breath for 3-5 seconds or for as long as possible. Allow the piston or ball to fall to the bottom of the column. Remove the mouthpiece from your mouth and breathe out normally. Rest for a few seconds and repeat Steps 1 through 7 at least 10 times every 1-2 hours when you are awake. Take your time and take a few normal breaths between deep breaths. The spirometer may include an indicator to show your best effort. Use the indicator as a goal to work toward during each repetition. After each set of 10 deep breaths, practice coughing to be sure your lungs are clear. If you have an incision (the cut made at the time of surgery), support your incision when coughing by placing a pillow or rolled up towels firmly against  it. Once you are able to get out of bed, walk around indoors and cough well. You may stop using the incentive spirometer when instructed by your caregiver.  RISKS AND COMPLICATIONS Take your time so you do not get dizzy or light-headed. If you are in pain, you may need to take or ask for pain medication before doing incentive spirometry. It is harder to take a deep breath if you are having pain. AFTER USE Rest and breathe slowly and easily. It can be helpful to keep track of a log of your progress. Your caregiver can provide you with a simple table to help with this. If you are using the spirometer at home, follow these instructions: SEEK MEDICAL CARE IF:  You are having difficultly using the spirometer. You have trouble using the spirometer as often as instructed. Your pain medication is not giving enough relief while using the spirometer. You develop fever of 100.5 F (38.1 C) or higher. SEEK IMMEDIATE MEDICAL CARE IF:  You cough up bloody sputum that had not been present before. You develop fever of 102 F (38.9 C) or greater. You develop worsening pain at or near the incision site. MAKE SURE YOU:  Understand these instructions. Will watch your condition. Will get help right away if you are not doing well or get worse. Document Released: 02/09/2007 Document Revised: 12/22/2011 Document Reviewed: 04/12/2007 [facility] Patient Information 2014 [place], [place]- Preparing for Total Shoulder Arthroplasty    Before surgery, you can play an important role. Because skin is not sterile, your skin needs to be as free of germs as possible. You can reduce the number of germs on your skin by using the following products. Benzoyl Peroxide Gel Reduces the number of germs present on the skin Applied twice a day to shoulder area starting two days before surgery    ==================================================================  Please follow these instructions carefully:  BENZOYL  PEROXIDE 5% GEL  Please do not use if you have an allergy to benzoyl peroxide.   If your skin becomes reddened/irritated stop using the benzoyl peroxide.  Starting two days before surgery, apply as follows: Apply benzoyl peroxide in the morning and at night. Apply after taking a shower. If you are not taking a shower clean entire shoulder front, back, and side along with the armpit with a clean wet washcloth.  Place a quarter-sized dollop on your shoulder and rub in thoroughly, making sure to cover the front, back, and side of your shoulder, along with the armpit.   2 days before ____ AM   ____ PM  1 day before ____ AM   ____ PM                         Do this twice a day for two days.  (Last application is the night before surgery, AFTER using the CHG soap as described below).  Do NOT apply benzoyl peroxide gel on the day of surgery.

## 2024-02-26 ENCOUNTER — Ambulatory Visit (HOSPITAL_COMMUNITY)
Admission: RE | Admit: 2024-02-26 | Discharge: 2024-02-26 | Disposition: A | Discharge status: Home / Self Care | Source: Ambulatory Visit | Attending: Orthopedic Surgery | Admitting: Orthopedic Surgery

## 2024-02-26 ENCOUNTER — Encounter (HOSPITAL_COMMUNITY)
Admission: RE | Admit: 2024-02-26 | Discharge: 2024-02-26 | Disposition: A | Discharge status: Home / Self Care | Source: Ambulatory Visit | Attending: Orthopedic Surgery | Admitting: Orthopedic Surgery

## 2024-02-26 ENCOUNTER — Encounter (HOSPITAL_COMMUNITY): Payer: Self-pay

## 2024-02-26 ENCOUNTER — Other Ambulatory Visit: Payer: Self-pay

## 2024-02-26 VITALS — BP 141/73 | HR 60 | Temp 98.7°F | Resp 16 | Ht 67.0 in | Wt 170.0 lb

## 2024-02-26 DIAGNOSIS — N183 Chronic kidney disease, stage 3 unspecified: Secondary | ICD-10-CM | POA: Insufficient documentation

## 2024-02-26 DIAGNOSIS — M19011 Primary osteoarthritis, right shoulder: Secondary | ICD-10-CM | POA: Insufficient documentation

## 2024-02-26 DIAGNOSIS — I10 Essential (primary) hypertension: Secondary | ICD-10-CM | POA: Diagnosis not present

## 2024-02-26 DIAGNOSIS — Z01818 Encounter for other preprocedural examination: Secondary | ICD-10-CM | POA: Diagnosis not present

## 2024-02-26 DIAGNOSIS — Z8673 Personal history of transient ischemic attack (TIA), and cerebral infarction without residual deficits: Secondary | ICD-10-CM | POA: Diagnosis not present

## 2024-02-26 DIAGNOSIS — M129 Arthropathy, unspecified: Secondary | ICD-10-CM | POA: Diagnosis not present

## 2024-02-26 DIAGNOSIS — M85811 Other specified disorders of bone density and structure, right shoulder: Secondary | ICD-10-CM | POA: Insufficient documentation

## 2024-02-26 DIAGNOSIS — I129 Hypertensive chronic kidney disease with stage 1 through stage 4 chronic kidney disease, or unspecified chronic kidney disease: Secondary | ICD-10-CM | POA: Insufficient documentation

## 2024-02-26 DIAGNOSIS — I480 Paroxysmal atrial fibrillation: Secondary | ICD-10-CM | POA: Diagnosis not present

## 2024-02-26 HISTORY — DX: Cerebral infarction, unspecified: I63.9

## 2024-02-26 LAB — SURGICAL PCR SCREEN
MRSA, PCR: NEGATIVE
Staphylococcus aureus: NEGATIVE

## 2024-02-26 LAB — CBC
HCT: 45.7 % (ref 39.0–52.0)
Hemoglobin: 14.5 g/dL (ref 13.0–17.0)
MCH: 27.4 pg (ref 26.0–34.0)
MCHC: 31.7 g/dL (ref 30.0–36.0)
MCV: 86.4 fL (ref 80.0–100.0)
Platelets: 217 10*3/uL (ref 150–400)
RBC: 5.29 MIL/uL (ref 4.22–5.81)
RDW: 14.2 % (ref 11.5–15.5)
WBC: 7.7 10*3/uL (ref 4.0–10.5)
nRBC: 0 % (ref 0.0–0.2)

## 2024-02-26 LAB — BASIC METABOLIC PANEL WITH GFR
Anion gap: 8 (ref 5–15)
BUN: 27 mg/dL — ABNORMAL HIGH (ref 8–23)
CO2: 25 mmol/L (ref 22–32)
Calcium: 8.4 mg/dL — ABNORMAL LOW (ref 8.9–10.3)
Chloride: 105 mmol/L (ref 98–111)
Creatinine, Ser: 1.23 mg/dL (ref 0.61–1.24)
GFR, Estimated: >60 mL/min (ref >60)
Glucose, Bld: 86 mg/dL (ref 70–99)
Potassium: 4.2 mmol/L (ref 3.5–5.1)
Sodium: 138 mmol/L (ref 135–145)

## 2024-03-01 NOTE — Progress Notes (Signed)
 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 never smoker with h/o HTN, CKD Stage III, PAF, s/p right carotid revascularization w/ TCAR 05/14/2023, right shoulder OA scheduled for above procedure 03/10/2024 with Dr. Sammye Cristal.   Per neurology note 10/29/2023, "from a neurological standpoint he would be cleared to undergo surgery since the stroke is > 6 months out."  Per cardiology preoperative evaluation 01/13/24, "Chart reviewed as part of pre-operative protocol coverage. Given past medical history and time since last visit, based on ACC/AHA guidelines, Logan Wright is at acceptable risk for the planned procedure without further cardiovascular testing.    The patient was advised that if he develops new symptoms prior to surgery to contact our office to arrange for a follow-up visit, and he verbalized understanding.   Patient can hold Eliquis  for 2 days prior to procedure.  Please resume Eliquis  as soon as possible postprocedure, at the discretion of the surgeon "   VS: BP (!) 141/73   Pulse 60   Temp 37.1 C (Oral)   Resp 16   Ht 5\' 7"  (1.702 m)   Wt 77.1 kg   SpO2 98%   BMI 26.63 kg/m   PROVIDERS: Mordechai April, DO is PCP   Primary Cardiologist: Ahmad Alert, MD  LABS: Labs reviewed: Acceptable for surgery. (all labs ordered are listed, but only abnormal results are displayed)  Labs Reviewed  BASIC METABOLIC PANEL WITH GFR - Abnormal; Notable for the following components:      Result Value   BUN 27 (*)    Calcium  8.4 (*)    All other components within normal limits   SURGICAL PCR SCREEN  CBC     IMAGES:   EKG:   CV: Echo 05/06/23 1. Left ventricular ejection fraction, by estimation, is 55 to 60%. The  left ventricle has normal function. The left ventricle has no regional  wall motion abnormalities. Left ventricular diastolic parameters were  normal.   2. Right ventricular systolic function is normal. The right ventricular  size is normal. There is normal pulmonary artery systolic pressure.   3. The mitral valve is grossly normal. Trivial mitral valve  regurgitation. No evidence of mitral stenosis.   4. The aortic valve is grossly normal. There is mild calcification of the  aortic valve. There is mild thickening of the aortic valve. Aortic valve  regurgitation is not visualized. Aortic valve sclerosis/calcification is  present, without any evidence of  aortic stenosis.   5. Aortic dilatation noted. There is mild dilatation of the ascending  aorta, measuring 42 mm.   6. The inferior vena cava is normal in size with greater than 50%  respiratory variability, suggesting right atrial pressure of 3 mmHg.   Comparison(s): No significant change from prior study.  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   Echocardiogram 07/2020: 42 mm   Breast cancer (HCC) 2021   right breast   Breast cancer, right (HCC)    CKD (chronic kidney disease),  stage III [facility])    Colon adenoma 2015   Coronary artery calcification seen on CT scan    Diverticulosis    DENIES    DJD (degenerative joint disease)    Dysrhythmia    A-fib   ED (erectile dysfunction)    Elevated blood pressure 2007-2008   Esophageal reflux    GERD (gastroesophageal reflux disease)    Gout    Gynecomastia    LEFT   Hearing loss    HTN (hypertension)    Hypercholesteremia    Non-cardiac chest pain    OA (osteoarthritis)    LEFT SHOULDER   Onychomycosis of foot with other complication    PAF  (paroxysmal atrial fibrillation) (HCC)    Pre-diabetes    Seborrhea    Severe frontal headaches    RESOLVED    Stroke (HCC)    Vertigo    RESOLVED    Wears glasses    Wears hearing aid     Past Surgical History:  Procedure Laterality Date   COLONSCOPY      HIP ARTHROPLASTY Bilateral    AT [place]    IR CV LINE INJECTION  05/14/2020   IR IMAGING GUIDED PORT INSERTION  05/17/2020   IR REMOVAL TUN ACCESS W/ PORT W/O FL MOD SED  05/17/2020   KNEE ARTHROSCOPY     UNSURE WHICH KNEE    MASTECTOMY Right 03/16/2020   MASTECTOMY W/ SENTINEL NODE BIOPSY Right 03/16/2020   Procedure: RIGHT BREAST MASTECTOMY WITH SENTINEL LYMPH NODE BIOPSY;  Surgeon: Oza Blumenthal, MD;  Location: [facility];  Service: General;  Laterality: Right;   PORTACATH PLACEMENT Left 05/08/2020   Procedure: INSERTION PORT-A-CATH WITH ULTRASOUND;  Surgeon: Oza Blumenthal, MD;  Location: [facility];  Service: General;  Laterality: Left;  LMA   TOTAL KNEE ARTHROPLASTY Left 11/23/2018   Procedure: TOTAL KNEE ARTHROPLASTY;  Surgeon: Claiborne Crew, MD;  Location: [facility];  Service: Orthopedics;  Laterality: Left;  70 mins   TRANSCAROTID ARTERY REVASCULARIZATION  Right 05/14/2023   Procedure: Transcarotid Artery Revascularization;  Surgeon: Carlene Che, MD;  [facility] OR;  Service: Vascular;  Laterality: Right;   ULTRASOUND GUIDANCE FOR VASCULAR ACCESS Left 05/14/2023   Procedure: ULTRASOUND GUIDANCE FOR VASCULAR ACCESS;  Surgeon: Carlene Che, MD;  Location: [facility];  Service: Vascular;  Laterality: Left;    MEDICATIONS:  allopurinol  (ZYLOPRIM ) 100 MG tablet   apixaban  (ELIQUIS ) 5 MG TABS tablet   aspirin  EC 81 MG tablet   atorvastatin  (LIPITOR ) 80 MG tablet   diltiazem  (CARDIZEM  CD) 120 MG 24 hr capsule   diphenhydrAMINE -zinc acetate (BENADRYL ) cream   empagliflozin (JARDIANCE) 10 MG TABS tablet   GLUCOSAMINE-CHONDROITIN PO   latanoprost (XALATAN) 0.005 % ophthalmic solution   Multiple  Vitamin (MULTIVITAMIN) tablet   Omega-3 Fatty Acids (FISH OIL ULTRA) 1400 MG CAPS   Potassium 99 MG TABS   tamoxifen  (NOLVADEX ) 20 MG tablet   No current facility-administered medications for this encounter.     Chick Cotton Ward, PA-C [facility]-Surgical Testing [phone]

## 2024-03-01 NOTE — Anesthesia Preprocedure Evaluation (Signed)
 Anesthesia Evaluation  Patient identified by MRN, date of birth, ID band Patient awake    Reviewed: Allergy & Precautions, NPO status , Patient's Chart, lab work & pertinent test results  Airway Mallampati: III  TM Distance: >3 FB     Dental  (+) Teeth Intact, Dental Advisory Given   Pulmonary neg pulmonary ROS   breath sounds clear to auscultation       Cardiovascular hypertension, + CAD  + dysrhythmias Atrial Fibrillation  Rhythm:Regular Rate:Normal     Neuro/Psych  Headaches TIACVA  negative psych ROS   GI/Hepatic Neg liver ROS,GERD  ,,  Endo/Other  diabetes, Type 2, Oral Hypoglycemic Agents    Renal/GU Renal disease     Musculoskeletal  (+) Arthritis ,    Abdominal   Peds  Hematology negative hematology ROS (+)   Anesthesia Other Findings   Reproductive/Obstetrics                             Anesthesia Physical Anesthesia Plan  ASA: 3  Anesthesia Plan: General   Post-op Pain Management: Regional block*   Induction: Intravenous  PONV Risk Score and Plan: 3 and Ondansetron , Dexamethasone  and Midazolam   Airway Management Planned: Oral ETT  Additional Equipment: None  Intra-op Plan:   Post-operative Plan: Extubation in OR  Informed Consent: I have reviewed the patients History and Physical, chart, labs and discussed the procedure including the risks, benefits and alternatives for the proposed anesthesia with the patient or authorized representative who has indicated his/her understanding and acceptance.     Dental advisory given  Plan Discussed with: CRNA  Anesthesia Plan Comments: (See PAT note 02/26/2024)       Anesthesia Quick Evaluation

## 2024-03-08 DIAGNOSIS — M25511 Pain in right shoulder: Secondary | ICD-10-CM | POA: Diagnosis not present

## 2024-03-10 ENCOUNTER — Ambulatory Visit (HOSPITAL_BASED_OUTPATIENT_CLINIC_OR_DEPARTMENT_OTHER): Payer: Self-pay | Admitting: Anesthesiology

## 2024-03-10 ENCOUNTER — Encounter (HOSPITAL_COMMUNITY)
Admission: RE | Disposition: A | Discharge status: Home / Self Care | Payer: Self-pay | Source: Ambulatory Visit | Attending: Orthopedic Surgery

## 2024-03-10 ENCOUNTER — Ambulatory Visit (HOSPITAL_COMMUNITY): Payer: Self-pay | Admitting: Physician Assistant

## 2024-03-10 ENCOUNTER — Ambulatory Visit (HOSPITAL_COMMUNITY)
Admission: RE | Admit: 2024-03-10 | Discharge: 2024-03-10 | Disposition: A | Discharge status: Home / Self Care | Source: Ambulatory Visit | Attending: Orthopedic Surgery | Admitting: Orthopedic Surgery

## 2024-03-10 ENCOUNTER — Other Ambulatory Visit: Payer: Self-pay

## 2024-03-10 ENCOUNTER — Encounter (HOSPITAL_COMMUNITY): Payer: Self-pay | Admitting: Orthopedic Surgery

## 2024-03-10 DIAGNOSIS — Z79899 Other long term (current) drug therapy: Secondary | ICD-10-CM | POA: Diagnosis not present

## 2024-03-10 DIAGNOSIS — G8918 Other acute postprocedural pain: Secondary | ICD-10-CM | POA: Diagnosis not present

## 2024-03-10 DIAGNOSIS — N183 Chronic kidney disease, stage 3 unspecified: Secondary | ICD-10-CM | POA: Insufficient documentation

## 2024-03-10 DIAGNOSIS — I480 Paroxysmal atrial fibrillation: Secondary | ICD-10-CM | POA: Diagnosis not present

## 2024-03-10 DIAGNOSIS — M1990 Unspecified osteoarthritis, unspecified site: Secondary | ICD-10-CM | POA: Diagnosis not present

## 2024-03-10 DIAGNOSIS — I4891 Unspecified atrial fibrillation: Secondary | ICD-10-CM

## 2024-03-10 DIAGNOSIS — Z7984 Long term (current) use of oral hypoglycemic drugs: Secondary | ICD-10-CM | POA: Diagnosis not present

## 2024-03-10 DIAGNOSIS — M19011 Primary osteoarthritis, right shoulder: Secondary | ICD-10-CM | POA: Diagnosis not present

## 2024-03-10 DIAGNOSIS — I2510 Atherosclerotic heart disease of native coronary artery without angina pectoris: Secondary | ICD-10-CM

## 2024-03-10 DIAGNOSIS — Z7901 Long term (current) use of anticoagulants: Secondary | ICD-10-CM | POA: Insufficient documentation

## 2024-03-10 DIAGNOSIS — R519 Headache, unspecified: Secondary | ICD-10-CM | POA: Insufficient documentation

## 2024-03-10 DIAGNOSIS — Z96611 Presence of right artificial shoulder joint: Secondary | ICD-10-CM | POA: Diagnosis not present

## 2024-03-10 DIAGNOSIS — E1122 Type 2 diabetes mellitus with diabetic chronic kidney disease: Secondary | ICD-10-CM | POA: Insufficient documentation

## 2024-03-10 DIAGNOSIS — M85811 Other specified disorders of bone density and structure, right shoulder: Secondary | ICD-10-CM | POA: Insufficient documentation

## 2024-03-10 DIAGNOSIS — K219 Gastro-esophageal reflux disease without esophagitis: Secondary | ICD-10-CM | POA: Diagnosis not present

## 2024-03-10 DIAGNOSIS — M109 Gout, unspecified: Secondary | ICD-10-CM | POA: Diagnosis not present

## 2024-03-10 DIAGNOSIS — I129 Hypertensive chronic kidney disease with stage 1 through stage 4 chronic kidney disease, or unspecified chronic kidney disease: Secondary | ICD-10-CM | POA: Diagnosis not present

## 2024-03-10 DIAGNOSIS — Z7982 Long term (current) use of aspirin: Secondary | ICD-10-CM | POA: Insufficient documentation

## 2024-03-10 HISTORY — PX: REVERSE SHOULDER ARTHROPLASTY: SHX5054

## 2024-03-10 LAB — GLUCOSE, CAPILLARY: Glucose-Capillary: 95 mg/dL (ref 70–99)

## 2024-03-10 SURGERY — ARTHROPLASTY, SHOULDER, TOTAL, REVERSE
Anesthesia: General | Site: Shoulder | Laterality: Right

## 2024-03-10 MED ORDER — FENTANYL CITRATE (PF) 100 MCG/2ML IJ SOLN
INTRAMUSCULAR | Status: DC | PRN
  Administered 2024-03-10: 50 ug via INTRAVENOUS

## 2024-03-10 MED ORDER — PHENYLEPHRINE 80 MCG/ML (10ML) SYRINGE FOR IV PUSH (FOR BLOOD PRESSURE SUPPORT)
PREFILLED_SYRINGE | INTRAVENOUS | Status: AC
  Filled 2024-03-10: qty 10

## 2024-03-10 MED ORDER — OXYCODONE HCL 5 MG/5ML PO SOLN: 5.0000 mg | Freq: Once | ORAL | Status: DC | PRN

## 2024-03-10 MED ORDER — PHENYLEPHRINE HCL-NACL 20-0.9 MG/250ML-% IV SOLN
INTRAVENOUS | Status: AC
  Filled 2024-03-10: qty 250

## 2024-03-10 MED ORDER — ACETAMINOPHEN 10 MG/ML IV SOLN: 1000.0000 mg | Freq: Once | INTRAVENOUS | Status: DC | PRN

## 2024-03-10 MED ORDER — ONDANSETRON HCL 4 MG/2ML IJ SOLN: 4.0000 mg | Freq: Four times a day (QID) | INTRAMUSCULAR | Status: DC | PRN

## 2024-03-10 MED ORDER — CHLORHEXIDINE GLUCONATE 0.12 % MT SOLN
15.0000 mL | Freq: Once | OROMUCOSAL | Status: AC
  Administered 2024-03-10: 15 mL via OROMUCOSAL

## 2024-03-10 MED ORDER — PHENYLEPHRINE 80 MCG/ML (10ML) SYRINGE FOR IV PUSH (FOR BLOOD PRESSURE SUPPORT)
PREFILLED_SYRINGE | INTRAVENOUS | Status: AC
Start: 2024-03-10 — End: ?
  Filled 2024-03-10: qty 10

## 2024-03-10 MED ORDER — PHENYLEPHRINE HCL-NACL 20-0.9 MG/250ML-% IV SOLN
INTRAVENOUS | Status: DC | PRN
  Administered 2024-03-10: 40 ug/min via INTRAVENOUS

## 2024-03-10 MED ORDER — MIDAZOLAM HCL 5 MG/5ML IJ SOLN
INTRAMUSCULAR | Status: DC | PRN
  Administered 2024-03-10 (×2): 1 mg via INTRAVENOUS

## 2024-03-10 MED ORDER — PHENYLEPHRINE 80 MCG/ML (10ML) SYRINGE FOR IV PUSH (FOR BLOOD PRESSURE SUPPORT)
PREFILLED_SYRINGE | INTRAVENOUS | Status: DC | PRN
  Administered 2024-03-10: 80 ug via INTRAVENOUS

## 2024-03-10 MED ORDER — LIDOCAINE HCL (CARDIAC) PF 100 MG/5ML IV SOSY
PREFILLED_SYRINGE | INTRAVENOUS | Status: DC | PRN
Start: 2024-03-10 — End: 2024-03-10
  Administered 2024-03-10: 50 mg via INTRAVENOUS

## 2024-03-10 MED ORDER — ROCURONIUM BROMIDE 100 MG/10ML IV SOLN
INTRAVENOUS | Status: DC | PRN
  Administered 2024-03-10: 10 mg via INTRAVENOUS
  Administered 2024-03-10: 50 mg via INTRAVENOUS

## 2024-03-10 MED ORDER — LIDOCAINE HCL (PF) 2 % IJ SOLN
INTRAMUSCULAR | Status: AC
  Filled 2024-03-10: qty 10

## 2024-03-10 MED ORDER — PROPOFOL 10 MG/ML IV BOLUS
INTRAVENOUS | Status: AC
  Filled 2024-03-10: qty 20

## 2024-03-10 MED ORDER — WATER FOR IRRIGATION, STERILE IR SOLN
Status: DC | PRN
  Administered 2024-03-10: 1000 mL

## 2024-03-10 MED ORDER — OXYCODONE HCL 5 MG PO TABS: 5.0000 mg | ORAL_TABLET | Freq: Once | ORAL | Status: DC | PRN

## 2024-03-10 MED ORDER — ONDANSETRON HCL 4 MG/2ML IJ SOLN
INTRAMUSCULAR | Status: AC
  Filled 2024-03-10: qty 4

## 2024-03-10 MED ORDER — LACTATED RINGERS IV SOLN: INTRAVENOUS | Status: DC

## 2024-03-10 MED ORDER — ONDANSETRON HCL 4 MG/2ML IJ SOLN
INTRAMUSCULAR | Status: DC | PRN
  Administered 2024-03-10: 4 mg via INTRAVENOUS

## 2024-03-10 MED ORDER — SUGAMMADEX SODIUM 200 MG/2ML IV SOLN
INTRAVENOUS | Status: DC | PRN
Start: 2024-03-10 — End: 2024-03-10
  Administered 2024-03-10 (×2): 150 mg via INTRAVENOUS

## 2024-03-10 MED ORDER — TRANEXAMIC ACID-NACL 1000-0.7 MG/100ML-% IV SOLN
1000.0000 mg | INTRAVENOUS | Status: AC
  Administered 2024-03-10: 1000 mg via INTRAVENOUS
  Filled 2024-03-10: qty 100

## 2024-03-10 MED ORDER — ORAL CARE MOUTH RINSE: 15.0000 mL | Freq: Once | OROMUCOSAL | Status: AC

## 2024-03-10 MED ORDER — DEXAMETHASONE SODIUM PHOSPHATE 10 MG/ML IJ SOLN
INTRAMUSCULAR | Status: AC
  Filled 2024-03-10: qty 2

## 2024-03-10 MED ORDER — SODIUM CHLORIDE 0.9 % IR SOLN
Status: DC | PRN
Start: 2024-03-10 — End: 2024-03-10
  Administered 2024-03-10: 1000 mL

## 2024-03-10 MED ORDER — DEXAMETHASONE SODIUM PHOSPHATE 10 MG/ML IJ SOLN
INTRAMUSCULAR | Status: DC | PRN
  Administered 2024-03-10: 10 mg via INTRAVENOUS

## 2024-03-10 MED ORDER — FENTANYL CITRATE (PF) 100 MCG/2ML IJ SOLN
INTRAMUSCULAR | Status: AC
  Filled 2024-03-10: qty 2

## 2024-03-10 MED ORDER — ROCURONIUM BROMIDE 10 MG/ML (PF) SYRINGE
PREFILLED_SYRINGE | INTRAVENOUS | Status: AC
  Filled 2024-03-10: qty 10

## 2024-03-10 MED ORDER — MIDAZOLAM HCL 2 MG/2ML IJ SOLN
INTRAMUSCULAR | Status: AC
  Filled 2024-03-10: qty 2

## 2024-03-10 MED ORDER — PROPOFOL 10 MG/ML IV BOLUS
INTRAVENOUS | Status: DC | PRN
  Administered 2024-03-10: 140 mg via INTRAVENOUS

## 2024-03-10 MED ORDER — EPHEDRINE 5 MG/ML INJ
INTRAVENOUS | Status: AC
  Filled 2024-03-10: qty 5

## 2024-03-10 MED ORDER — INSULIN ASPART 100 UNIT/ML IJ SOLN: 0.0000 [IU] | INTRAMUSCULAR | Status: DC | PRN

## 2024-03-10 MED ORDER — CEFAZOLIN SODIUM-DEXTROSE 2-4 GM/100ML-% IV SOLN
2.0000 g | INTRAVENOUS | Status: AC
  Administered 2024-03-10: 2 g via INTRAVENOUS
  Filled 2024-03-10: qty 100

## 2024-03-10 MED ORDER — ONDANSETRON HCL 4 MG PO TABS: 4.0000 mg | ORAL_TABLET | Freq: Four times a day (QID) | ORAL | Status: DC | PRN

## 2024-03-10 MED ORDER — FENTANYL CITRATE PF 50 MCG/ML IJ SOSY: 25.0000 ug | PREFILLED_SYRINGE | INTRAMUSCULAR | Status: DC | PRN

## 2024-03-10 MED ORDER — TIZANIDINE HCL 4 MG PO TABS: 4.0000 mg | ORAL_TABLET | Freq: Three times a day (TID) | ORAL | 1 refills | Status: DC | PRN

## 2024-03-10 MED ORDER — 0.9 % SODIUM CHLORIDE (POUR BTL) OPTIME
TOPICAL | Status: DC | PRN
  Administered 2024-03-10: 1000 mL

## 2024-03-10 MED ORDER — OXYCODONE HCL 5 MG PO TABS: 5.0000 mg | ORAL_TABLET | ORAL | 0 refills | Status: DC | PRN

## 2024-03-10 MED ORDER — BUPIVACAINE LIPOSOME 1.3 % IJ SUSP
INTRAMUSCULAR | Status: DC | PRN
Start: 2024-03-10 — End: 2024-03-10
  Administered 2024-03-10: 10 mL via PERINEURAL

## 2024-03-10 MED ORDER — DROPERIDOL 2.5 MG/ML IJ SOLN
0.6250 mg | Freq: Once | INTRAMUSCULAR | Status: DC | PRN
Start: 2024-03-10 — End: 2024-03-10

## 2024-03-10 MED ORDER — BUPIVACAINE HCL (PF) 0.5 % IJ SOLN
INTRAMUSCULAR | Status: DC | PRN
  Administered 2024-03-10: 12 mL via PERINEURAL

## 2024-03-10 SURGICAL SUPPLY — 69 items
BAG COUNTER SPONGE SURGICOUNT (BAG) IMPLANT
BAG ZIPLOCK 12X15 (MISCELLANEOUS) ×1 IMPLANT
BASEPLATE GLEN ALTIVATE 6.5 (Joint) IMPLANT
BLADE SAW SGTL 73X25 THK (BLADE) ×1 IMPLANT
BOOTIES KNEE HIGH SLOAN (MISCELLANEOUS) ×2 IMPLANT
COOLER ICEMAN CLASSIC (MISCELLANEOUS) ×1 IMPLANT
COVER BACK TABLE 60X90IN (DRAPES) ×1 IMPLANT
COVER SURGICAL LIGHT HANDLE (MISCELLANEOUS) ×1 IMPLANT
DRAPE INCISE IOBAN 66X45 STRL (DRAPES) ×1 IMPLANT
DRAPE POUCH INSTRU U-SHP 10X18 (DRAPES) ×1 IMPLANT
DRAPE SHEET LG 3/4 BI-LAMINATE (DRAPES) ×1 IMPLANT
DRAPE SURG 17X11 SM STRL (DRAPES) ×1 IMPLANT
DRAPE SURG ORHT 6 SPLT 77X108 (DRAPES) ×2 IMPLANT
DRAPE TOP 10253 STERILE (DRAPES) ×1 IMPLANT
DRAPE U-SHAPE 47X51 STRL (DRAPES) ×1 IMPLANT
DRILL GLEN ALTIVATE 3.5 (DRILL) IMPLANT
DRSG AQUACEL AG ADV 3.5X 6 (GAUZE/BANDAGES/DRESSINGS) ×1 IMPLANT
DURAPREP 26ML APPLICATOR (WOUND CARE) ×2 IMPLANT
ELECT BLADE TIP CTD 4 INCH (ELECTRODE) ×1 IMPLANT
ELECT PENCIL ROCKER SW 15FT (MISCELLANEOUS) ×1 IMPLANT
ELECT REM PT RETURN 15FT ADLT (MISCELLANEOUS) ×1 IMPLANT
FACESHIELD WRAPAROUND (MASK) ×1 IMPLANT
FACESHIELD WRAPAROUND OR TEAM (MASK) ×1 IMPLANT
GLOVE BIO SURGEON STRL SZ7.5 (GLOVE) ×1 IMPLANT
GLOVE BIOGEL PI IND STRL 6.5 (GLOVE) ×1 IMPLANT
GLOVE BIOGEL PI IND STRL 8 (GLOVE) ×1 IMPLANT
GLOVE SURG SS PI 6.5 STRL IVOR (GLOVE) ×1 IMPLANT
GOWN STRL REUS W/ TWL LRG LVL3 (GOWN DISPOSABLE) ×1 IMPLANT
GOWN STRL REUS W/ TWL XL LVL3 (GOWN DISPOSABLE) ×1 IMPLANT
GUIDE WIRE ALTIVATE 2.4X228 SL (WIRE) IMPLANT
HEAD GLENOID W/SCREW 32MM (Shoulder) IMPLANT
HOOD PEEL AWAY T7 (MISCELLANEOUS) ×3 IMPLANT
INSERT EPOLYSTD HUMERUS 36MM (Shoulder) IMPLANT
KIT BASIN OR (CUSTOM PROCEDURE TRAY) ×1 IMPLANT
KIT TURNOVER KIT A (KITS) ×1 IMPLANT
MANIFOLD NEPTUNE II (INSTRUMENTS) ×1 IMPLANT
NDL TROCAR POINT SZ 2 1/2 (NEEDLE) IMPLANT
NEEDLE TROCAR POINT SZ 2 1/2 (NEEDLE) IMPLANT
NS IRRIG 1000ML POUR BTL (IV SOLUTION) ×1 IMPLANT
PACK SHOULDER (CUSTOM PROCEDURE TRAY) ×1 IMPLANT
PAD COLD SHLDR WRAP-ON (PAD) ×1 IMPLANT
RESTRAINT HEAD UNIVERSAL NS (MISCELLANEOUS) ×1 IMPLANT
RETRIEVER SUT HEWSON (MISCELLANEOUS) IMPLANT
SCREW BONE LOCKING RSP 5.0X14 (Screw) ×1 IMPLANT
SCREW BONE LOCKING RSP 5.0X30 (Screw) ×1 IMPLANT
SCREW BONE LOCKING RSP 5.0X34 (Screw) ×1 IMPLANT
SCREW BONE RSP LOCK 5X14 (Screw) IMPLANT
SCREW BONE RSP LOCK 5X22 (Screw) IMPLANT
SCREW BONE RSP LOCK 5X30 (Screw) IMPLANT
SCREW BONE RSP LOCK 5X34 (Screw) IMPLANT
SCREW BONE RSP LOCKING 5.0X32 (Screw) ×1 IMPLANT
SCREW CENTR ALTIVATE 6.5X30 (Screw) IMPLANT
SET HNDPC FAN SPRY TIP SCT (DISPOSABLE) ×1 IMPLANT
SLING ARM FOAM STRAP LRG (SOFTGOODS) IMPLANT
SLING ARM FOAM STRAP MED (SOFTGOODS) IMPLANT
STEM HUMERAL 12X48 STD SHORT (Shoulder) IMPLANT
STRIP CLOSURE SKIN 1/2X4 (GAUZE/BANDAGES/DRESSINGS) ×1 IMPLANT
SUCTION TUBE FRAZIER 10FR DISP (SUCTIONS) IMPLANT
SUPPORT WRAP ARM LG (MISCELLANEOUS) ×1 IMPLANT
SUT ETHIBOND 2 V 37 (SUTURE) IMPLANT
SUT MNCRL AB 4-0 PS2 18 (SUTURE) ×1 IMPLANT
SUT VIC AB 2-0 CT1 TAPERPNT 27 (SUTURE) ×2 IMPLANT
SUTURE FIBERWR#2 38 REV NDL BL (SUTURE) IMPLANT
TAP CANN GLEN 6.5 (TAP) IMPLANT
TAPE LABRALWHITE 1.5X36 (TAPE) IMPLANT
TAPE SUT LABRALTAP WHT/BLK (SUTURE) IMPLANT
TOWEL OR 17X26 10 PK STRL BLUE (TOWEL DISPOSABLE) ×1 IMPLANT
TUBE SUCTION HIGH CAP CLEAR NV (SUCTIONS) ×1 IMPLANT
WATER STERILE IRR 1000ML POUR (IV SOLUTION) ×1 IMPLANT

## 2024-03-10 NOTE — Discharge Instructions (Addendum)
 Discharge Instructions after Reverse Total Shoulder Arthroplasty   A sling has been provided for you. You are to wear this at all times (except for bathing and dressing), until your first post operative visit with Dr. Ave Filter. Please also wear while sleeping at night. While you bath and dress, let the arm/elbow extend straight down to stretch your elbow. Wiggle your fingers and pump your first while your in the sling to prevent hand swelling. Use ice on the shoulder intermittently over the first 48 hours after surgery. Continue to use ice or and ice machine as needed after 48 hours for pain control/swelling.  Pain medicine has been prescribed for you.  Use your medicine liberally over the first 48 hours, and then you can begin to taper your use. You may take Extra Strength Tylenol or Tylenol only in place of the pain pills. DO NOT take ANY nonsteroidal anti-inflammatory pain medications: Advil, Motrin, Ibuprofen, Aleve, Naproxen or Naprosyn.  Resume Eliquis and aspirin the day after surgery Leave your dressing on until your first follow up visit.  You may shower with the dressing.  Hold your arm as if you still have your sling on while you shower. Simply allow the water to wash over the site and then pat dry. Make sure your axilla (armpit) is completely dry after showering.    Please call [phone] during normal business hours or [phone] after hours for any problems. Including the following:  - excessive redness of the incisions - drainage for more than 4 days - fever of more than 101.5 F  *Please note that pain medications will not be refilled after hours or on weekends.    Dental Antibiotics:  In most cases prophylactic antibiotics for Dental procdeures after total joint surgery are not necessary.  Exceptions are as follows:  1. History of prior total joint infection  2. Severely immunocompromised (Organ Transplant, cancer chemotherapy, Rheumatoid biologic meds such as  Humera)  3. Poorly controlled diabetes (A1C &gt; 8.0, blood glucose over 200)  If you have one of these conditions, contact your surgeon for an antibiotic prescription, prior to your dental procedure.

## 2024-03-10 NOTE — Anesthesia Procedure Notes (Signed)
 Procedure Name: Intubation Date/Time: 03/10/2024 7:43 AM  Performed by: Raylene Calamity, CRNAPre-anesthesia Checklist: Patient identified, Emergency Drugs available, Suction available and Patient being monitored Patient Re-evaluated:Patient Re-evaluated prior to induction Oxygen  Delivery Method: Circle System Utilized Preoxygenation: Pre-oxygenation with 100% oxygen  Induction Type: IV induction Ventilation: Oral airway inserted - appropriate to patient size and Mask ventilation without difficulty Laryngoscope Size: Mac and 4 Grade View: Grade III Tube type: Oral Tube size: 7.5 mm Number of attempts: 2 Airway Equipment and Method: Stylet and Oral airway Placement Confirmation: ETT inserted through vocal cords under direct vision, positive ETCO2 and breath sounds checked- equal and bilateral Secured at: 24 cm Tube secured with: Tape Dental Injury: Teeth and Oropharynx as per pre-operative assessment  Comments: Attempt x 1 by SRNA with Miller 2, poor view, Attempt by CRNA with MAC 4 grade three view.

## 2024-03-10 NOTE — Op Note (Signed)
 Procedure(s): ARTHROPLASTY, SHOULDER, TOTAL, REVERSE Procedure Note  JOVANI FLURY male 78 y.o. 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 male  With endstage shoulder arthritis with significant glenoid bone loss.  Pain and dysfunction interfered with quality of life and nonoperative treatment with activity modification, NSAIDS and injections failed.     Surgeon: Audrene Blessing   Assistants: Sidra Dredge PA-C Amber was present and scrubbed throughout the procedure and was essential in positioning, retraction, exposure, and closure)  Anesthesia: General endotracheal anesthesia with preoperative interscalene block given by the attending anesthesiologist     Procedure Detail  ARTHROPLASTY, SHOULDER, TOTAL, REVERSE   Estimated Blood Loss:  200 mL         Drains: none  Blood Given: none          Specimens: none        Complications:  * No complications entered in OR log *         Disposition: PACU - hemodynamically stable.         Condition: stable      OPERATIVE FINDINGS:  A DJO Altivate pressfit reverse total shoulder arthroplasty was placed with a  size 12 stem, a 36 standard glenosphere, and a standard-mm poly insert. The base plate  fixation was excellent.  PROCEDURE: The patient was identified in the preoperative holding area  where I personally marked the operative site after verifying site, side,  and procedure with the patient. An interscalene block given by  the attending anesthesiologist in the holding area and the patient was taken back to the operating room where all extremities were  carefully padded in position after general anesthesia was induced. She  was placed in a beach-chair position and the operative upper extremity was   prepped and draped in a standard sterile fashion. An approximately 10-  cm incision was made from the tip of the coracoid process to the center  point of the humerus at the level of the axilla. Dissection was carried  down through subcutaneous tissues to the level of the cephalic vein  which was taken laterally with the deltoid. The pectoralis major was  retracted medially. The subdeltoid space was developed and the lateral  edge of the conjoined tendon was identified. The undersurface of  conjoined tendon was palpated and the musculocutaneous nerve was not in  the field. Retractor was placed underneath the conjoined and second  retractor was placed lateral into the deltoid. The circumflex humeral  artery and vessels were identified and clamped and coagulated. The  biceps tendon was tenotomized.  The subscapularis was degenerated and taken down as a peel.  The  joint was then gently externally rotated while the capsule was released  from the humeral neck around to just beyond the 6 o'clock position. At  this point, the joint was dislocated and the humeral head was presented  into the wound. The excessive osteophyte formation was removed with a  large rongeur.  The cutting guide was used to make the appropriate  head cut and the head was saved for potentially bone grafting.  The glenoid was exposed with the arm in an  abducted extended position. The anterior and posterior labrum were  completely excised and the capsule was released circumferentially to  allow for exposure of the glenoid for preparation.  The elevated  guide for the augmented glenoid was then placed and the central guidepin was placed.  The reamer was used to ream a concentric surface.  The anterior hole was then drilled with the guide.  The implant was then impacted in the appropriate position.  The central Morse taper was placed. The peripheral guide was then used to drilled measured and filled peripheral locking screws. The  size 36 standard glenosphere was then impacted on the [facility] taper and the central screw was placed. The humerus was then again exposed and the diaphyseal reamers were used followed by the metaphyseal reamers. The final broach was left in place in the proximal trial was placed. The joint was reduced and with this implant it was felt that soft tissue tensioning was appropriate with excellent stability and excellent range of motion. Therefore, final humeral stem was placed press-fit.  And then the trial polyethylene inserts were tested again and the above implant was felt to be the most appropriate for final insertion. The joint was reduced taken through full range of motion and felt to be stable. Soft tissue tension was appropriate.  The joint was then copiously irrigated with pulse  lavage and the wound was then closed. The subscapularis was severely contracted and not repaired.  Skin was closed with 2-0 Vicryl in a deep dermal layer and 4-0  Monocryl for skin closure. Steri-Strips were applied. Sterile  dressings were then applied as well as a sling. The patient was allowed  to awaken from general anesthesia, transferred to stretcher, and taken  to recovery room in stable condition.   POSTOPERATIVE PLAN: The patient will be observed in the recovery room and if pain is well-controlled with regional anesthesia and he is hemodynamically stable he can be discharged home today with family.

## 2024-03-10 NOTE — Evaluation (Signed)
 Occupational Therapy Evaluation Patient Details Name: URIYAH RASKA MRN: [id number] DOB: [birth year] Today's Date: 03/10/2024   History of Present Illness   Hulen Mandler is a 78 y o male s/p R Reverse TSA with augmented glenoid. PMH: CVA, Ca, OA     Clinical Impressions PTA pt lives at home with wife and was independent prior to this am surgery.  Education completed regarding compensatory strategies for ADL tasks and functional mobility, management of sling, R ROM per specified parameters in the order set as indicated below, positioning of operative arm in sitting and supine and edema control, including use of "Iceman" Cold Therapy machine. Caregiver present for education, written handouts provided and reviewed using Teach Back and pt/caregiver verbalized/demonstrated understanding. Due to the below listed deficits, pt requires min A assistance with ADL tasks and Supervision assist with functional mobility. Caregiver will be able to provide necessary level of assistance at discharge. Pt to follow up with MD to progress rehab of the operative shoulder.    If plan is discharge home, recommend the following:   A little help with walking and/or transfers;A little help with bathing/dressing/bathroom;Assist for transportation;Help with stairs or ramp for entrance     Functional Status Assessment   Patient has had a recent decline in their functional status and demonstrates the ability to make significant improvements in function in a reasonable and predictable amount of time.     Equipment Recommendations   None recommended by OT      Precautions/Restrictions   Precautions Precautions: Shoulder No shoulder ROM allowed; elbow/wrist/hand AROM only. Required Braces or Orthoses: Sling Restrictions Weight Bearing Restrictions Per Provider Order: Yes     Mobility Bed Mobility Overal bed mobility:  (in recliner for session)                  Transfers Overall transfer  level: Modified independent                        Balance Overall balance assessment: No apparent balance deficits (not formally assessed)                                         ADL either performed or assessed with clinical judgement   ADL Overall ADL's : Needs assistance/impaired  Per orders, R shoulder parameters as follows for ADL tasks: No shoulder ROM; elbow/wrist/hand ROM only. While moving within specified parameters, pt/caregiver instructed on bathing and how to donn/doff shirt, placing operative arm through sleeve first when donning and off last when doffing.Pt/caregiver educated on compensatory strategies for LB ADL and strategies to reduce risk of falls.  Pt/caregiver educated on donning/doffing sling and to wear the sling at all times with the exception of ADL, and to loosen the neck strap of the sling when the operative arm is in a supported position when sitting. In sitting or supine, pt instructed to have a pillow behind and under their operative arm to provide support. If assist needed with ambulation, caregiver educated on the importance of walking on pt's non-operative side.  Education regarding use of "IceMan" Cold Therapy completed, including the importance of using a barrier on the shoulder prior to positioning the wrap-on pad. Pt/caregiver verbalized/demonstrated understanding. Teach Back used while caregiver assisted with dressing pt and positioning "wrap-on pad" to facilitate DC.  Vision Baseline Vision/History: 0 No visual deficits              Pertinent Vitals/Pain Pain Assessment Pain Assessment: No/denies pain     Extremity/Trunk Assessment Upper Extremity Assessment Upper Extremity Assessment: Right hand dominant;RUE deficits/detail RUE Deficits / Details: R operative n block remains active and AAROM R forearm thru hand WFL   Lower Extremity Assessment Lower Extremity  Assessment: Overall WFL for tasks assessed   Cervical / Trunk Assessment Cervical / Trunk Assessment: Normal   Communication Communication Communication: No apparent difficulties   Cognition Arousal: Alert Behavior During Therapy: WFL for tasks assessed/performed Cognition: No apparent impairments                               Following commands: Intact       Cueing  General Comments   Cueing Techniques: Verbal cues  R post op icision bandage c/d/i   Exercises Exercises: Shoulder Shoulder Exercises Elbow Flexion: AAROM, 10 reps Elbow Extension: AAROM, 10 reps Wrist Flexion: AAROM, 10 reps Wrist Extension: AAROM, 10 reps Digit Composite Flexion: AAROM, 10 reps Composite Extension: AAROM, 10 reps   Shoulder Instructions Shoulder Instructions Donning/doffing shirt without moving shoulder: Caregiver independent with task;Patient able to independently direct caregiver Method for sponge bathing under operated UE: Caregiver independent with task;Patient able to independently direct caregiver Donning/doffing sling/immobilizer: Caregiver independent with task;Patient able to independently direct caregiver Correct positioning of sling/immobilizer: Caregiver independent with task;Patient able to independently direct caregiver ROM for elbow, wrist and digits of operated UE: Caregiver independent with task;Patient able to independently direct caregiver Sling wearing schedule (on at all times/off for ADL's): Caregiver independent with task;Patient able to independently direct caregiver Proper positioning of operated UE when showering: Caregiver independent with task;Patient able to independently direct caregiver Positioning of UE while sleeping: Caregiver independent with task;Patient able to independently direct caregiver    Home Living Family/patient expects to be discharged to:: Private residence Living Arrangements: Spouse/significant other Available Help at  Discharge: Family Type of Home: House Home Access: Stairs to enter Secretary/administrator of Steps: 1   Home Layout: One level     Bathroom Shower/Tub: Walk-in Pensions consultant: Handicapped height Bathroom Accessibility: Yes How Accessible: Accessible via walker Home Equipment: Cane - single point;Shower Counsellor (2 wheels)          Prior Functioning/Environment Prior Level of Function : Independent/Modified Independent;Driving                    OT Problem List: Impaired UE functional use    AM-PAC OT "6 Clicks" Daily Activity     Outcome Measure Help from another person eating meals?: None Help from another person taking care of personal grooming?: A Little Help from another person toileting, which includes using toliet, bedpan, or urinal?: A Little Help from another person bathing (including washing, rinsing, drying)?: A Little Help from another person to put on and taking off regular upper body clothing?: A Little Help from another person to put on and taking off regular lower body clothing?: A Little 6 Click Score: 19   End of Session Equipment Utilized During Treatment: Gait belt Nurse Communication: Mobility status  Activity Tolerance: Patient tolerated treatment well Patient left: in chair;with call bell/phone within reach;with nursing/sitter in room;with family/visitor present  OT Visit Diagnosis: Other (comment) (R UE dysfunction)  Time: 1010-1040 OT Time Calculation (min): 30 min Charges:  OT General Charges $OT Visit: 1 Visit OT Evaluation $OT Eval Low Complexity: 1 Low OT Treatments $Self Care/Home Management : 8-22 mins  Alinda Egolf OT/L Acute Rehabilitation Department  [phone]  03/10/2024, 12:02 PM

## 2024-03-10 NOTE — Anesthesia Procedure Notes (Signed)
 Anesthesia Regional Block: Interscalene brachial plexus block   Pre-Anesthetic Checklist: , timeout performed,  Correct Patient, Correct Site, Correct Laterality,  Correct Procedure, Correct Position, site marked,  Risks and benefits discussed,  Surgical consent,  Pre-op evaluation,  At surgeon's request and post-op pain management  Laterality: Right  Prep: chloraprep       Needles:  Injection technique: Single-shot  Needle Type: Echogenic Stimulator Needle     Needle Length: 9cm  Needle Gauge: 21     Additional Needles:   Procedures:,,,, ultrasound used (permanent image in chart),,    Narrative:  Start time: 03/10/2024 7:10 AM End time: 03/10/2024 7:15 AM Injection made incrementally with aspirations every 5 mL.  Performed by: Personally  Anesthesiologist: Willian Harrow, MD  Additional Notes: Discussed risks and benefits of the nerve block in detail, including but not limited vascular injury, permanent nerve damage and infection.   Patient tolerated the procedure well. Local anesthetic introduced in an incremental fashion under minimal resistance after negative aspirations. No paresthesias were elicited. After completion of the procedure, no acute issues were identified and patient continued to be monitored by RN.

## 2024-03-10 NOTE — Anesthesia Postprocedure Evaluation (Signed)
 Anesthesia Post Note  Patient: Logan Wright  Procedure(s) Performed: ARTHROPLASTY, SHOULDER, TOTAL, REVERSE (Right: Shoulder)     Patient location during evaluation: PACU Anesthesia Type: General Level of consciousness: awake and alert Pain management: pain level controlled Vital Signs Assessment: post-procedure vital signs reviewed and stable Respiratory status: spontaneous breathing, nonlabored ventilation, respiratory function stable and patient connected to nasal cannula oxygen  Cardiovascular status: blood pressure returned to baseline and stable Postop Assessment: no apparent nausea or vomiting Anesthetic complications: no   No notable events documented.  Last Vitals:  Vitals:   03/10/24 0930 03/10/24 0951  BP: (!) 140/81 (!) 145/79  Pulse: 62   Resp: (!) 21 16  Temp:  (!) 35.8 C  SpO2: 97% 95%    Last Pain:  Vitals:   03/10/24 0951  TempSrc:   PainSc: 0-No pain                 Willian Harrow

## 2024-03-10 NOTE — Transfer of Care (Signed)
 Immediate Anesthesia Transfer of Care Note  Patient: Logan Wright  Procedure(s) Performed: ARTHROPLASTY, SHOULDER, TOTAL, REVERSE (Right: Shoulder)  Patient Location: PACU  Anesthesia Type:General  Level of Consciousness: awake and alert   Airway & Oxygen  Therapy: Patient Spontanous Breathing  Post-op Assessment: Report given to RN  Post vital signs: Reviewed and stable  Last Vitals:  Vitals Value Taken Time  BP 139/82 03/10/24 0910  Temp    Pulse 61 03/10/24 0913  Resp 16 03/10/24 0913  SpO2 99 % 03/10/24 0913  Vitals shown include unfiled device data.  Last Pain:  Vitals:   03/10/24 0607  TempSrc:   PainSc: 0-No pain         Complications: No notable events documented.

## 2024-03-10 NOTE — H&P (Addendum)
 Logan Wright is an 78 y.o. male.   Chief Complaint: right shoulder pain   HPI: right shoulder end stage osteoarthritis, failed conservative management  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   Echocardiogram 07/2020: 42 mm   Breast cancer (HCC) 2021   right breast   Breast cancer, right (HCC)    CKD (chronic kidney disease), stage III (HCC)    Colon adenoma 2015   Coronary artery calcification seen on CT scan    Diverticulosis    DENIES    DJD (degenerative joint disease)    Dysrhythmia    A-fib   ED (erectile dysfunction)    Elevated blood pressure 2007-2008   Esophageal reflux    GERD (gastroesophageal reflux disease)    Gout    Gynecomastia    LEFT   Hearing loss    HTN (hypertension)    Hypercholesteremia    Non-cardiac chest pain    OA (osteoarthritis)    LEFT SHOULDER   Onychomycosis of foot with other complication    PAF (paroxysmal atrial fibrillation) (HCC)    Pre-diabetes    Seborrhea    Severe frontal headaches    RESOLVED    Stroke (HCC)    Vertigo    RESOLVED    Wears glasses    Wears hearing aid     Past Surgical History:  Procedure Laterality Date   COLONSCOPY      HIP ARTHROPLASTY Bilateral    AT [place]    IR CV LINE INJECTION  05/14/2020   IR IMAGING GUIDED PORT INSERTION  05/17/2020   IR REMOVAL TUN ACCESS W/ PORT W/O FL MOD SED  05/17/2020   KNEE ARTHROSCOPY     UNSURE WHICH KNEE    MASTECTOMY Right 03/16/2020   MASTECTOMY W/ SENTINEL NODE BIOPSY Right 03/16/2020   Procedure: RIGHT BREAST MASTECTOMY WITH SENTINEL LYMPH NODE BIOPSY;  Surgeon: Oza Blumenthal, MD;  Location: [facility];  Service: General;  Laterality: Right;   PORTACATH PLACEMENT Left 05/08/2020   Procedure: INSERTION PORT-A-CATH WITH ULTRASOUND;  Surgeon: Oza Blumenthal, MD;  Location: [facility] OR;  Service: General;  Laterality: Left;  LMA   TOTAL KNEE ARTHROPLASTY Left  11/23/2018   Procedure: TOTAL KNEE ARTHROPLASTY;  Surgeon: Claiborne Crew, MD;  Location: [facility];  Service: Orthopedics;  Laterality: Left;  70 mins   TRANSCAROTID ARTERY REVASCULARIZATION  Right 05/14/2023   Procedure: Transcarotid Artery Revascularization;  Surgeon: Carlene Che, MD;  Location: [facility] OR;  Service: Vascular;  Laterality: Right;   ULTRASOUND GUIDANCE FOR VASCULAR ACCESS Left 05/14/2023   Procedure: ULTRASOUND GUIDANCE FOR VASCULAR ACCESS;  Surgeon: Carlene Che, MD;  Location[facility] OR;  Service: Vascular;  Laterality: Left;    Family History  Problem Relation Age of Onset   Heart attack Mother    Stroke Mother    Heart failure Father        57   Hypertension Neg Hx    Breast cancer Neg Hx    Social History:  reports that he has never smoked. He has never used smokeless tobacco. He reports that he does not drink alcohol and does not use drugs.  Allergies: No Known Allergies  Medications Prior to Admission  Medication Sig Dispense Refill   allopurinol  (ZYLOPRIM ) 100 MG tablet Take 200 mg by mouth daily.     apixaban  (ELIQUIS ) 5 MG TABS tablet Take 1 tablet by mouth twice  daily 180 tablet 1   aspirin  EC 81 MG tablet Take 81 mg by mouth daily. Swallow whole.     atorvastatin  (LIPITOR ) 80 MG tablet Take 80 mg by mouth at bedtime.     diltiazem  (CARDIZEM  CD) 120 MG 24 hr capsule Take 120 mg by mouth daily.     diphenhydrAMINE -zinc acetate (BENADRYL ) cream Apply 1 Application topically 3 (three) times daily as needed for itching.     empagliflozin (JARDIANCE) 10 MG TABS tablet Take 10 mg by mouth daily.     GLUCOSAMINE-CHONDROITIN PO Take 2 tablets by mouth daily.     latanoprost (XALATAN) 0.005 % ophthalmic solution Place 1 drop into both eyes at bedtime.     Multiple Vitamin (MULTIVITAMIN) tablet Take 1 tablet by mouth daily.      Omega-3 Fatty Acids (FISH OIL ULTRA) 1400 MG CAPS Take 1,400-2,800 mg by mouth See admin instructions. Take 1400 mg in the morning and 2800 mg  in the evening     Potassium 99 MG TABS Take 1 tablet by mouth daily.     tamoxifen  (NOLVADEX ) 20 MG tablet Take 1 tablet by mouth once daily 90 tablet 3    No results found for this or any previous visit (from the past 48 hours). No results found.  Review of Systems  Blood pressure (!) 143/79, pulse (!) 56, temperature 98.2 F (36.8 C), temperature source Oral, resp. rate 16, height 5\' 7"  (1.702 m), weight 77.1 kg, SpO2 94%. Physical Exam  R shoulder pain with limited ROM. NVID  Assessment/Plan right shoulder end stage osteoarthritis, failed conservative management  associated Glenoid bone loss  plan right reverse total shoulder replacement with augmented glenoid  Audrene Blessing, MD 03/10/2024, 6:18 AM

## 2024-03-11 ENCOUNTER — Encounter (HOSPITAL_COMMUNITY): Payer: Self-pay | Admitting: Orthopedic Surgery

## 2024-03-14 DIAGNOSIS — Z96611 Presence of right artificial shoulder joint: Secondary | ICD-10-CM | POA: Diagnosis not present

## 2024-03-14 DIAGNOSIS — M25611 Stiffness of right shoulder, not elsewhere classified: Secondary | ICD-10-CM | POA: Diagnosis not present

## 2024-03-14 DIAGNOSIS — M25511 Pain in right shoulder: Secondary | ICD-10-CM | POA: Diagnosis not present

## 2024-03-14 DIAGNOSIS — R531 Weakness: Secondary | ICD-10-CM | POA: Diagnosis not present

## 2024-03-15 DIAGNOSIS — M25511 Pain in right shoulder: Secondary | ICD-10-CM | POA: Diagnosis not present

## 2024-03-15 DIAGNOSIS — M25611 Stiffness of right shoulder, not elsewhere classified: Secondary | ICD-10-CM | POA: Diagnosis not present

## 2024-03-15 DIAGNOSIS — Z96611 Presence of right artificial shoulder joint: Secondary | ICD-10-CM | POA: Diagnosis not present

## 2024-03-15 DIAGNOSIS — R531 Weakness: Secondary | ICD-10-CM | POA: Diagnosis not present

## 2024-03-16 DIAGNOSIS — Z96611 Presence of right artificial shoulder joint: Secondary | ICD-10-CM | POA: Diagnosis not present

## 2024-03-16 DIAGNOSIS — M25611 Stiffness of right shoulder, not elsewhere classified: Secondary | ICD-10-CM | POA: Diagnosis not present

## 2024-03-16 DIAGNOSIS — R531 Weakness: Secondary | ICD-10-CM | POA: Diagnosis not present

## 2024-03-16 DIAGNOSIS — M25511 Pain in right shoulder: Secondary | ICD-10-CM | POA: Diagnosis not present

## 2024-03-16 DIAGNOSIS — R202 Paresthesia of skin: Secondary | ICD-10-CM | POA: Diagnosis not present

## 2024-03-17 ENCOUNTER — Encounter (HOSPITAL_COMMUNITY): Payer: Self-pay | Admitting: Orthopedic Surgery

## 2024-03-17 DIAGNOSIS — M25611 Stiffness of right shoulder, not elsewhere classified: Secondary | ICD-10-CM | POA: Diagnosis not present

## 2024-03-17 DIAGNOSIS — M25511 Pain in right shoulder: Secondary | ICD-10-CM | POA: Diagnosis not present

## 2024-03-17 DIAGNOSIS — R531 Weakness: Secondary | ICD-10-CM | POA: Diagnosis not present

## 2024-03-17 DIAGNOSIS — Z96611 Presence of right artificial shoulder joint: Secondary | ICD-10-CM | POA: Diagnosis not present

## 2024-03-21 ENCOUNTER — Encounter: Payer: Self-pay | Admitting: Cardiovascular Disease

## 2024-03-21 DIAGNOSIS — R531 Weakness: Secondary | ICD-10-CM | POA: Diagnosis not present

## 2024-03-21 DIAGNOSIS — Z96611 Presence of right artificial shoulder joint: Secondary | ICD-10-CM | POA: Diagnosis not present

## 2024-03-21 DIAGNOSIS — M25511 Pain in right shoulder: Secondary | ICD-10-CM | POA: Diagnosis not present

## 2024-03-21 DIAGNOSIS — M25611 Stiffness of right shoulder, not elsewhere classified: Secondary | ICD-10-CM | POA: Diagnosis not present

## 2024-03-21 NOTE — Telephone Encounter (Signed)
**Note De-identified  [place] Obfuscation** Please advise 

## 2024-03-21 NOTE — Telephone Encounter (Signed)
 Dr. Avanell Bob are you taking new pts?

## 2024-03-22 ENCOUNTER — Other Ambulatory Visit: Payer: Self-pay | Admitting: Cardiovascular Disease

## 2024-03-22 DIAGNOSIS — M25611 Stiffness of right shoulder, not elsewhere classified: Secondary | ICD-10-CM | POA: Diagnosis not present

## 2024-03-22 DIAGNOSIS — R531 Weakness: Secondary | ICD-10-CM | POA: Diagnosis not present

## 2024-03-22 DIAGNOSIS — M25511 Pain in right shoulder: Secondary | ICD-10-CM | POA: Diagnosis not present

## 2024-03-22 DIAGNOSIS — I480 Paroxysmal atrial fibrillation: Secondary | ICD-10-CM

## 2024-03-22 DIAGNOSIS — Z96611 Presence of right artificial shoulder joint: Secondary | ICD-10-CM | POA: Diagnosis not present

## 2024-03-22 NOTE — Telephone Encounter (Signed)
 Prescription refill request for Eliquis  received. Indication:afib Last office visit:3/25 Scr:1.23  5/25 Age: 78 Weight:77.1  kg  Prescription refilled

## 2024-03-23 DIAGNOSIS — M25511 Pain in right shoulder: Secondary | ICD-10-CM | POA: Diagnosis not present

## 2024-03-23 DIAGNOSIS — M25611 Stiffness of right shoulder, not elsewhere classified: Secondary | ICD-10-CM | POA: Diagnosis not present

## 2024-03-23 DIAGNOSIS — I10 Essential (primary) hypertension: Secondary | ICD-10-CM | POA: Diagnosis not present

## 2024-03-23 DIAGNOSIS — Z96611 Presence of right artificial shoulder joint: Secondary | ICD-10-CM | POA: Diagnosis not present

## 2024-03-23 DIAGNOSIS — N183 Chronic kidney disease, stage 3 unspecified: Secondary | ICD-10-CM | POA: Diagnosis not present

## 2024-03-23 DIAGNOSIS — I4891 Unspecified atrial fibrillation: Secondary | ICD-10-CM | POA: Diagnosis not present

## 2024-03-23 DIAGNOSIS — G459 Transient cerebral ischemic attack, unspecified: Secondary | ICD-10-CM | POA: Diagnosis not present

## 2024-03-23 DIAGNOSIS — R531 Weakness: Secondary | ICD-10-CM | POA: Diagnosis not present

## 2024-03-24 DIAGNOSIS — M25611 Stiffness of right shoulder, not elsewhere classified: Secondary | ICD-10-CM | POA: Diagnosis not present

## 2024-03-24 DIAGNOSIS — R531 Weakness: Secondary | ICD-10-CM | POA: Diagnosis not present

## 2024-03-24 DIAGNOSIS — M25511 Pain in right shoulder: Secondary | ICD-10-CM | POA: Diagnosis not present

## 2024-03-24 DIAGNOSIS — Z96611 Presence of right artificial shoulder joint: Secondary | ICD-10-CM | POA: Diagnosis not present

## 2024-03-25 DIAGNOSIS — M19011 Primary osteoarthritis, right shoulder: Secondary | ICD-10-CM | POA: Diagnosis not present

## 2024-03-28 DIAGNOSIS — M25511 Pain in right shoulder: Secondary | ICD-10-CM | POA: Diagnosis not present

## 2024-03-28 DIAGNOSIS — R531 Weakness: Secondary | ICD-10-CM | POA: Diagnosis not present

## 2024-03-28 DIAGNOSIS — Z96611 Presence of right artificial shoulder joint: Secondary | ICD-10-CM | POA: Diagnosis not present

## 2024-03-28 DIAGNOSIS — M25611 Stiffness of right shoulder, not elsewhere classified: Secondary | ICD-10-CM | POA: Diagnosis not present

## 2024-03-29 DIAGNOSIS — I4891 Unspecified atrial fibrillation: Secondary | ICD-10-CM | POA: Diagnosis not present

## 2024-03-29 DIAGNOSIS — R351 Nocturia: Secondary | ICD-10-CM | POA: Diagnosis not present

## 2024-03-29 DIAGNOSIS — I700 Atherosclerosis of aorta: Secondary | ICD-10-CM | POA: Diagnosis not present

## 2024-03-29 DIAGNOSIS — R5383 Other fatigue: Secondary | ICD-10-CM | POA: Diagnosis not present

## 2024-03-29 DIAGNOSIS — I10 Essential (primary) hypertension: Secondary | ICD-10-CM | POA: Diagnosis not present

## 2024-03-29 DIAGNOSIS — R809 Proteinuria, unspecified: Secondary | ICD-10-CM | POA: Diagnosis not present

## 2024-03-29 DIAGNOSIS — M109 Gout, unspecified: Secondary | ICD-10-CM | POA: Diagnosis not present

## 2024-03-29 DIAGNOSIS — I77819 Aortic ectasia, unspecified site: Secondary | ICD-10-CM | POA: Diagnosis not present

## 2024-03-31 DIAGNOSIS — M25511 Pain in right shoulder: Secondary | ICD-10-CM | POA: Diagnosis not present

## 2024-03-31 DIAGNOSIS — Z96611 Presence of right artificial shoulder joint: Secondary | ICD-10-CM | POA: Diagnosis not present

## 2024-03-31 DIAGNOSIS — R531 Weakness: Secondary | ICD-10-CM | POA: Diagnosis not present

## 2024-03-31 DIAGNOSIS — M25611 Stiffness of right shoulder, not elsewhere classified: Secondary | ICD-10-CM | POA: Diagnosis not present

## 2024-04-04 DIAGNOSIS — Z96611 Presence of right artificial shoulder joint: Secondary | ICD-10-CM | POA: Diagnosis not present

## 2024-04-04 DIAGNOSIS — M25511 Pain in right shoulder: Secondary | ICD-10-CM | POA: Diagnosis not present

## 2024-04-04 DIAGNOSIS — R531 Weakness: Secondary | ICD-10-CM | POA: Diagnosis not present

## 2024-04-04 DIAGNOSIS — M25611 Stiffness of right shoulder, not elsewhere classified: Secondary | ICD-10-CM | POA: Diagnosis not present

## 2024-04-06 DIAGNOSIS — R531 Weakness: Secondary | ICD-10-CM | POA: Diagnosis not present

## 2024-04-06 DIAGNOSIS — M25511 Pain in right shoulder: Secondary | ICD-10-CM | POA: Diagnosis not present

## 2024-04-06 DIAGNOSIS — Z96611 Presence of right artificial shoulder joint: Secondary | ICD-10-CM | POA: Diagnosis not present

## 2024-04-06 DIAGNOSIS — M25611 Stiffness of right shoulder, not elsewhere classified: Secondary | ICD-10-CM | POA: Diagnosis not present

## 2024-04-11 DIAGNOSIS — M25511 Pain in right shoulder: Secondary | ICD-10-CM | POA: Diagnosis not present

## 2024-04-11 DIAGNOSIS — I4891 Unspecified atrial fibrillation: Secondary | ICD-10-CM | POA: Diagnosis not present

## 2024-04-11 DIAGNOSIS — G459 Transient cerebral ischemic attack, unspecified: Secondary | ICD-10-CM | POA: Diagnosis not present

## 2024-04-11 DIAGNOSIS — M25611 Stiffness of right shoulder, not elsewhere classified: Secondary | ICD-10-CM | POA: Diagnosis not present

## 2024-04-11 DIAGNOSIS — I10 Essential (primary) hypertension: Secondary | ICD-10-CM | POA: Diagnosis not present

## 2024-04-11 DIAGNOSIS — Z96611 Presence of right artificial shoulder joint: Secondary | ICD-10-CM | POA: Diagnosis not present

## 2024-04-11 DIAGNOSIS — R531 Weakness: Secondary | ICD-10-CM | POA: Diagnosis not present

## 2024-04-11 DIAGNOSIS — N183 Chronic kidney disease, stage 3 unspecified: Secondary | ICD-10-CM | POA: Diagnosis not present

## 2024-04-12 DIAGNOSIS — R202 Paresthesia of skin: Secondary | ICD-10-CM | POA: Diagnosis not present

## 2024-04-12 DIAGNOSIS — L814 Other melanin hyperpigmentation: Secondary | ICD-10-CM | POA: Diagnosis not present

## 2024-04-12 DIAGNOSIS — L821 Other seborrheic keratosis: Secondary | ICD-10-CM | POA: Diagnosis not present

## 2024-04-12 DIAGNOSIS — L578 Other skin changes due to chronic exposure to nonionizing radiation: Secondary | ICD-10-CM | POA: Diagnosis not present

## 2024-04-13 DIAGNOSIS — Z96611 Presence of right artificial shoulder joint: Secondary | ICD-10-CM | POA: Diagnosis not present

## 2024-04-13 DIAGNOSIS — M25611 Stiffness of right shoulder, not elsewhere classified: Secondary | ICD-10-CM | POA: Diagnosis not present

## 2024-04-13 DIAGNOSIS — R531 Weakness: Secondary | ICD-10-CM | POA: Diagnosis not present

## 2024-04-13 DIAGNOSIS — M25511 Pain in right shoulder: Secondary | ICD-10-CM | POA: Diagnosis not present

## 2024-04-18 DIAGNOSIS — R531 Weakness: Secondary | ICD-10-CM | POA: Diagnosis not present

## 2024-04-18 DIAGNOSIS — M25511 Pain in right shoulder: Secondary | ICD-10-CM | POA: Diagnosis not present

## 2024-04-18 DIAGNOSIS — M25611 Stiffness of right shoulder, not elsewhere classified: Secondary | ICD-10-CM | POA: Diagnosis not present

## 2024-04-18 DIAGNOSIS — Z96611 Presence of right artificial shoulder joint: Secondary | ICD-10-CM | POA: Diagnosis not present

## 2024-04-20 DIAGNOSIS — R531 Weakness: Secondary | ICD-10-CM | POA: Diagnosis not present

## 2024-04-20 DIAGNOSIS — Z96611 Presence of right artificial shoulder joint: Secondary | ICD-10-CM | POA: Diagnosis not present

## 2024-04-20 DIAGNOSIS — M25511 Pain in right shoulder: Secondary | ICD-10-CM | POA: Diagnosis not present

## 2024-04-20 DIAGNOSIS — M25611 Stiffness of right shoulder, not elsewhere classified: Secondary | ICD-10-CM | POA: Diagnosis not present

## 2024-04-21 DIAGNOSIS — I4891 Unspecified atrial fibrillation: Secondary | ICD-10-CM | POA: Diagnosis not present

## 2024-04-21 DIAGNOSIS — G459 Transient cerebral ischemic attack, unspecified: Secondary | ICD-10-CM | POA: Diagnosis not present

## 2024-04-21 DIAGNOSIS — I10 Essential (primary) hypertension: Secondary | ICD-10-CM | POA: Diagnosis not present

## 2024-04-21 DIAGNOSIS — N183 Chronic kidney disease, stage 3 unspecified: Secondary | ICD-10-CM | POA: Diagnosis not present

## 2024-04-25 DIAGNOSIS — M25512 Pain in left shoulder: Secondary | ICD-10-CM | POA: Diagnosis not present

## 2024-04-25 DIAGNOSIS — M19011 Primary osteoarthritis, right shoulder: Secondary | ICD-10-CM | POA: Diagnosis not present

## 2024-04-27 DIAGNOSIS — M25511 Pain in right shoulder: Secondary | ICD-10-CM | POA: Diagnosis not present

## 2024-04-27 DIAGNOSIS — Z96611 Presence of right artificial shoulder joint: Secondary | ICD-10-CM | POA: Diagnosis not present

## 2024-04-27 DIAGNOSIS — M25611 Stiffness of right shoulder, not elsewhere classified: Secondary | ICD-10-CM | POA: Diagnosis not present

## 2024-04-27 DIAGNOSIS — R531 Weakness: Secondary | ICD-10-CM | POA: Diagnosis not present

## 2024-05-02 DIAGNOSIS — R531 Weakness: Secondary | ICD-10-CM | POA: Diagnosis not present

## 2024-05-02 DIAGNOSIS — M25611 Stiffness of right shoulder, not elsewhere classified: Secondary | ICD-10-CM | POA: Diagnosis not present

## 2024-05-02 DIAGNOSIS — Z96611 Presence of right artificial shoulder joint: Secondary | ICD-10-CM | POA: Diagnosis not present

## 2024-05-02 DIAGNOSIS — M25511 Pain in right shoulder: Secondary | ICD-10-CM | POA: Diagnosis not present

## 2024-05-04 DIAGNOSIS — Z96611 Presence of right artificial shoulder joint: Secondary | ICD-10-CM | POA: Diagnosis not present

## 2024-05-04 DIAGNOSIS — M25511 Pain in right shoulder: Secondary | ICD-10-CM | POA: Diagnosis not present

## 2024-05-04 DIAGNOSIS — R531 Weakness: Secondary | ICD-10-CM | POA: Diagnosis not present

## 2024-05-04 DIAGNOSIS — M25611 Stiffness of right shoulder, not elsewhere classified: Secondary | ICD-10-CM | POA: Diagnosis not present

## 2024-05-12 DIAGNOSIS — G459 Transient cerebral ischemic attack, unspecified: Secondary | ICD-10-CM | POA: Diagnosis not present

## 2024-05-12 DIAGNOSIS — I10 Essential (primary) hypertension: Secondary | ICD-10-CM | POA: Diagnosis not present

## 2024-05-12 DIAGNOSIS — N183 Chronic kidney disease, stage 3 unspecified: Secondary | ICD-10-CM | POA: Diagnosis not present

## 2024-05-12 DIAGNOSIS — I4891 Unspecified atrial fibrillation: Secondary | ICD-10-CM | POA: Diagnosis not present

## 2024-05-17 DIAGNOSIS — M25511 Pain in right shoulder: Secondary | ICD-10-CM | POA: Diagnosis not present

## 2024-05-17 DIAGNOSIS — M25611 Stiffness of right shoulder, not elsewhere classified: Secondary | ICD-10-CM | POA: Diagnosis not present

## 2024-05-17 DIAGNOSIS — Z96611 Presence of right artificial shoulder joint: Secondary | ICD-10-CM | POA: Diagnosis not present

## 2024-05-17 DIAGNOSIS — R531 Weakness: Secondary | ICD-10-CM | POA: Diagnosis not present

## 2024-05-19 DIAGNOSIS — Z96611 Presence of right artificial shoulder joint: Secondary | ICD-10-CM | POA: Diagnosis not present

## 2024-05-19 DIAGNOSIS — M25511 Pain in right shoulder: Secondary | ICD-10-CM | POA: Diagnosis not present

## 2024-05-19 DIAGNOSIS — M25611 Stiffness of right shoulder, not elsewhere classified: Secondary | ICD-10-CM | POA: Diagnosis not present

## 2024-05-19 DIAGNOSIS — R531 Weakness: Secondary | ICD-10-CM | POA: Diagnosis not present

## 2024-05-31 DIAGNOSIS — Z96611 Presence of right artificial shoulder joint: Secondary | ICD-10-CM | POA: Diagnosis not present

## 2024-05-31 DIAGNOSIS — M25611 Stiffness of right shoulder, not elsewhere classified: Secondary | ICD-10-CM | POA: Diagnosis not present

## 2024-05-31 DIAGNOSIS — R531 Weakness: Secondary | ICD-10-CM | POA: Diagnosis not present

## 2024-05-31 DIAGNOSIS — M25511 Pain in right shoulder: Secondary | ICD-10-CM | POA: Diagnosis not present

## 2024-06-02 DIAGNOSIS — M25611 Stiffness of right shoulder, not elsewhere classified: Secondary | ICD-10-CM | POA: Diagnosis not present

## 2024-06-02 DIAGNOSIS — R531 Weakness: Secondary | ICD-10-CM | POA: Diagnosis not present

## 2024-06-02 DIAGNOSIS — M25511 Pain in right shoulder: Secondary | ICD-10-CM | POA: Diagnosis not present

## 2024-06-02 DIAGNOSIS — Z96611 Presence of right artificial shoulder joint: Secondary | ICD-10-CM | POA: Diagnosis not present

## 2024-06-06 DIAGNOSIS — M19011 Primary osteoarthritis, right shoulder: Secondary | ICD-10-CM | POA: Diagnosis not present

## 2024-06-08 DIAGNOSIS — R531 Weakness: Secondary | ICD-10-CM | POA: Diagnosis not present

## 2024-06-08 DIAGNOSIS — M25611 Stiffness of right shoulder, not elsewhere classified: Secondary | ICD-10-CM | POA: Diagnosis not present

## 2024-06-08 DIAGNOSIS — Z96611 Presence of right artificial shoulder joint: Secondary | ICD-10-CM | POA: Diagnosis not present

## 2024-06-08 DIAGNOSIS — M25511 Pain in right shoulder: Secondary | ICD-10-CM | POA: Diagnosis not present

## 2024-06-12 DIAGNOSIS — I10 Essential (primary) hypertension: Secondary | ICD-10-CM | POA: Diagnosis not present

## 2024-06-12 DIAGNOSIS — I4891 Unspecified atrial fibrillation: Secondary | ICD-10-CM | POA: Diagnosis not present

## 2024-06-12 DIAGNOSIS — N183 Chronic kidney disease, stage 3 unspecified: Secondary | ICD-10-CM | POA: Diagnosis not present

## 2024-06-12 DIAGNOSIS — G459 Transient cerebral ischemic attack, unspecified: Secondary | ICD-10-CM | POA: Diagnosis not present

## 2024-06-16 DIAGNOSIS — R531 Weakness: Secondary | ICD-10-CM | POA: Diagnosis not present

## 2024-06-16 DIAGNOSIS — M25511 Pain in right shoulder: Secondary | ICD-10-CM | POA: Diagnosis not present

## 2024-06-16 DIAGNOSIS — M25611 Stiffness of right shoulder, not elsewhere classified: Secondary | ICD-10-CM | POA: Diagnosis not present

## 2024-06-16 DIAGNOSIS — Z96611 Presence of right artificial shoulder joint: Secondary | ICD-10-CM | POA: Diagnosis not present

## 2024-06-17 ENCOUNTER — Emergency Department (HOSPITAL_COMMUNITY)

## 2024-06-17 ENCOUNTER — Other Ambulatory Visit: Payer: Self-pay

## 2024-06-17 ENCOUNTER — Emergency Department (HOSPITAL_COMMUNITY)
Admission: EM | Admit: 2024-06-17 | Discharge: 2024-06-17 | Disposition: A | Discharge status: Home / Self Care | Source: Ambulatory Visit

## 2024-06-17 ENCOUNTER — Other Ambulatory Visit: Payer: Self-pay | Admitting: Vascular Surgery

## 2024-06-17 ENCOUNTER — Encounter (HOSPITAL_COMMUNITY): Payer: Self-pay

## 2024-06-17 DIAGNOSIS — Z95828 Presence of other vascular implants and grafts: Secondary | ICD-10-CM

## 2024-06-17 DIAGNOSIS — I480 Paroxysmal atrial fibrillation: Secondary | ICD-10-CM | POA: Diagnosis not present

## 2024-06-17 DIAGNOSIS — I10 Essential (primary) hypertension: Secondary | ICD-10-CM | POA: Diagnosis not present

## 2024-06-17 DIAGNOSIS — R002 Palpitations: Secondary | ICD-10-CM | POA: Diagnosis present

## 2024-06-17 DIAGNOSIS — Z7901 Long term (current) use of anticoagulants: Secondary | ICD-10-CM | POA: Diagnosis not present

## 2024-06-17 DIAGNOSIS — I4891 Unspecified atrial fibrillation: Secondary | ICD-10-CM | POA: Diagnosis not present

## 2024-06-17 DIAGNOSIS — Z7982 Long term (current) use of aspirin: Secondary | ICD-10-CM | POA: Diagnosis not present

## 2024-06-17 DIAGNOSIS — I6521 Occlusion and stenosis of right carotid artery: Secondary | ICD-10-CM

## 2024-06-17 LAB — TROPONIN T, HIGH SENSITIVITY
Troponin T High Sensitivity: 35 ng/L — ABNORMAL HIGH (ref 0–19)
Troponin T High Sensitivity: 26 ng/L — ABNORMAL HIGH (ref 0–19)

## 2024-06-17 LAB — CBC
HCT: 46.5 % (ref 39.0–52.0)
Hemoglobin: 14.6 g/dL (ref 13.0–17.0)
MCH: 26.5 pg (ref 26.0–34.0)
MCHC: 31.4 g/dL (ref 30.0–36.0)
MCV: 84.5 fL (ref 80.0–100.0)
Platelets: 240 K/uL (ref 150–400)
RBC: 5.5 MIL/uL (ref 4.22–5.81)
RDW: 14.9 % (ref 11.5–15.5)
WBC: 8.6 K/uL (ref 4.0–10.5)
nRBC: 0 % (ref 0.0–0.2)

## 2024-06-17 LAB — MAGNESIUM: Magnesium: 2.3 mg/dL (ref 1.7–2.4)

## 2024-06-17 LAB — BASIC METABOLIC PANEL WITH GFR
Anion gap: 11 (ref 5–15)
BUN: 20 mg/dL (ref 8–23)
CO2: 21 mmol/L — ABNORMAL LOW (ref 22–32)
Calcium: 8.9 mg/dL (ref 8.9–10.3)
Chloride: 110 mmol/L (ref 98–111)
Creatinine, Ser: 1.31 mg/dL — ABNORMAL HIGH (ref 0.61–1.24)
GFR, Estimated: 55 mL/min — ABNORMAL LOW (ref >60)
Glucose, Bld: 117 mg/dL — ABNORMAL HIGH (ref 70–99)
Potassium: 4.1 mmol/L (ref 3.5–5.1)
Sodium: 141 mmol/L (ref 135–145)

## 2024-06-17 MED ORDER — ETOMIDATE 2 MG/ML IV SOLN
10.0000 mg | Freq: Once | INTRAVENOUS | Status: AC
  Administered 2024-06-17: 10 mg via INTRAVENOUS
  Filled 2024-06-17: qty 10

## 2024-06-17 MED ORDER — DILTIAZEM LOAD VIA INFUSION
10.0000 mg | Freq: Once | INTRAVENOUS | Status: DC
  Filled 2024-06-17: qty 10

## 2024-06-17 MED ORDER — DILTIAZEM HCL-DEXTROSE 125-5 MG/125ML-% IV SOLN (PREMIX)
5.0000 mg/h | INTRAVENOUS | Status: DC
  Filled 2024-06-17: qty 125

## 2024-06-17 MED ORDER — LACTATED RINGERS IV BOLUS
1000.0000 mL | Freq: Once | INTRAVENOUS | Status: AC
  Administered 2024-06-17: 1000 mL via INTRAVENOUS

## 2024-06-17 NOTE — Procedures (Signed)
 RT present during cardioversion, pt was on ETC02 detector when RT entered the room, post shock, Sp02 decreased to 91%, increased FI02 to 36%, equivalent 4 L.  Suction setup, Ambu bag present. Airway stable, pt phonating prior to RT departure, Sp02 96%.

## 2024-06-17 NOTE — Discharge Instructions (Signed)
 Please continue your Eliquis  and follow-up with the atrial fibrillation clinic.  You should receive a call in the next few days regarding follow-up.  Please continue your Cardizem /diltiazem .  Please return to the emergency department or go to [facility] emergency department for persistent or worsening symptoms.

## 2024-06-17 NOTE — ED Provider Notes (Signed)
 Patient assessment is ready for discharge at this time   Logan Faden, MD 06/17/24 [phone]

## 2024-06-17 NOTE — ED Provider Notes (Addendum)
 [place] EMERGENCY DEPARTMENT AT [facility] Provider Note   CSN: [id number] Arrival date & time: 06/17/24  1108     Patient presents with: Irregular Heart Beat   Logan Wright is a 78 y.o. male.   78 year old male with past medical history of hypertension and atrial fibrillation on Eliquis  and diltiazem  at home presenting to the emergency department today with some palpitations and shortness of breath.  Patient reportedly woke up this morning with some mild symptoms.  He does have a cardiac monitor that he wears chronically and it was informing him that he is atrial fibrillation.  He came to the ER at that time for further evaluation.  He denies any fevers, chills, or cough.  He came to the ER today for further evaluation and is due to ongoing symptoms.  He denies any associated chest pain or lower extremity swelling.  Denies any significant shortness of breath currently.       Prior to Admission medications   Medication Sig Start Date End Date Taking? Authorizing Provider  allopurinol  (ZYLOPRIM ) 100 MG tablet Take 200 mg by mouth daily.    [provider]  apixaban  (ELIQUIS ) 5 MG TABS tablet Take 1 tablet by mouth twice daily 03/22/24   [name], Aleene PARAS, MD  aspirin  EC 81 MG tablet Take 81 mg by mouth daily. Swallow whole.    [provider]  atorvastatin  (LIPITOR ) 80 MG tablet Take 80 mg by mouth at bedtime.    [provider]  diltiazem  (CARDIZEM  CD) 120 MG 24 hr capsule Take 120 mg by mouth daily. 12/22/23   [provider]  diphenhydrAMINE -zinc acetate (BENADRYL ) cream Apply 1 Application topically 3 (three) times daily as needed for itching.    [provider]  empagliflozin (JARDIANCE) 10 MG TABS tablet Take 10 mg by mouth daily.    [provider]  GLUCOSAMINE-CHONDROITIN PO Take 2 tablets by mouth daily.    [provider]  latanoprost (XALATAN) 0.005 % ophthalmic solution Place 1 drop into both eyes at  bedtime. 12/22/23   [provider]  Multiple Vitamin (MULTIVITAMIN) tablet Take 1 tablet by mouth daily.     [provider]  Omega-3 Fatty Acids (FISH OIL ULTRA) 1400 MG CAPS Take 1,400-2,800 mg by mouth See admin instructions. Take 1400 mg in the morning and 2800 mg in the evening    [provider]  oxyCODONE  (ROXICODONE ) 5 MG immediate release tablet Take 1 tablet (5 mg total) by mouth every 4 (four) hours as needed for severe pain (pain score 7-10). 03/10/24   Porterfield, Amber, PA-C  Potassium 99 MG TABS Take 1 tablet by mouth daily.    [provider]  tamoxifen  (NOLVADEX ) 20 MG tablet Take 1 tablet by mouth once daily 12/28/23   Gudena, Vinay, MD  tiZANidine  (ZANAFLEX ) 4 MG tablet Take 1 tablet (4 mg total) by mouth every 8 (eight) hours as needed for muscle spasms. 03/10/24 03/10/25  Porterfield, Amber, PA-C    Allergies: Patient has no known allergies.    Review of Systems  Cardiovascular:  Positive for palpitations.  All other systems reviewed and are negative.   Updated Vital Signs BP 114/75   Pulse 83   Temp 98.4 F (36.9 C) (Oral)   Resp (!) 21   SpO2 95%   Physical Exam Vitals and nursing note reviewed.   Gen: NAD Eyes: PERRL, EOMI HEENT: no oropharyngeal swelling Neck: trachea midline Resp: clear to auscultation bilaterally Card: tachycardic, irregular,  no murmurs, rubs, or gallops Abd: nontender, nondistended Extremities: no calf tenderness, no edema Vascular: 2+ radial pulses bilaterally, 2+ DP pulses bilaterally Skin: no rashes Psyc: acting appropriately   (all labs ordered are listed, but only abnormal results are displayed) Labs Reviewed  BASIC METABOLIC PANEL WITH GFR - Abnormal; Notable for the following components:      Result Value   CO2 21 (*)    Glucose, Bld 117 (*)    Creatinine, Ser 1.31 (*)    GFR, Estimated 55 (*)    All other components within normal limits  TROPONIN T, HIGH SENSITIVITY - Abnormal;  Notable for the following components:   Troponin T High Sensitivity 35 (*)    All other components within normal limits  TROPONIN T, HIGH SENSITIVITY - Abnormal; Notable for the following components:   Troponin T High Sensitivity 26 (*)    All other components within normal limits  CBC  MAGNESIUM     EKG: EKG Interpretation Date/Time:  Friday June 17 2024 11:16:57 EDT Ventricular Rate:  132 PR Interval:    QRS Duration:  90 QT Interval:  314 QTC Calculation: 501 R Axis:   33  Text Interpretation: Atrial fibrillation with rapid V-rate Ventricular premature complex Confirmed by Ula Barter [phone]) on 06/17/2024 11:22:36 AM  Radiology: No results found.   .Sedation  Date/Time: 06/17/2024 5:24 PM  Performed by: Ula Barter SAUNDERS, MD Authorized by: Ula Barter SAUNDERS, MD   Consent:    Consent obtained:  Written   Consent given by:  Patient   Risks discussed:  Allergic reaction, prolonged hypoxia resulting in organ damage, prolonged sedation necessitating reversal, dysrhythmia, inadequate sedation, respiratory compromise necessitating ventilatory assistance and intubation, vomiting and nausea   Alternatives discussed:  Analgesia without sedation Universal protocol:    Immediately prior to procedure, a time out was called: yes   Indications:    Procedure performed:  Cardioversion Pre-sedation assessment:    Time since last food or drink:  0800   ASA classification: class 2 - patient with mild systemic disease     Mallampati score:  I - soft palate, uvula, fauces, pillars visible   Pre-sedation assessments completed and reviewed: pre-procedure airway patency not reviewed, pre-procedure cardiovascular function not reviewed, pre-procedure hydration status not reviewed, pre-procedure mental status not reviewed, pre-procedure nausea and vomiting status not reviewed, pre-procedure pain level not reviewed, pre-procedure respiratory function not reviewed and pre-procedure temperature not  reviewed   A pre-sedation assessment was completed prior to the start of the procedure Procedure details (see MAR for exact dosages):    Sedation:  Etomidate    Intended level of sedation: deep   Analgesia:  None   Intra-procedure monitoring:  Blood pressure monitoring, cardiac monitor, continuous capnometry, continuous pulse oximetry, frequent LOC assessments and frequent vital sign checks   Intra-procedure events: hypoxia     Intra-procedure management:  Airway repositioning   Total Provider sedation time (minutes):  12 Post-procedure details:   A post-sedation assessment was completed following the completion of the procedure.   Attendance: Constant attendance by certified staff until patient recovered     Post-sedation assessments completed and reviewed: post-procedure airway patency not reviewed, post-procedure cardiovascular function not reviewed, post-procedure hydration status not reviewed, post-procedure mental status not reviewed, post-procedure nausea and vomiting status not reviewed, pain score not reviewed, post-procedure respiratory function not reviewed and post-procedure temperature not reviewed     Patient is stable for discharge or admission: no     Procedure completion:  Tolerated well, no  immediate complications Comments:     The patient did desat to 87% briefly and improved to 97% with airway repositioning .Cardioversion  Date/Time: 06/17/2024 5:27 PM  Performed by: Ula Prentice SAUNDERS, MD Authorized by: Ula Prentice SAUNDERS, MD   Consent:    Consent obtained:  Emergent situation   Consent given by:  Patient   Risks discussed:  Cutaneous burn, death, induced arrhythmia and pain   Alternatives discussed:  No treatment Pre-procedure details:    Cardioversion basis:  Elective   Rhythm:  Atrial fibrillation   Electrode placement:  Anterior-posterior Patient sedated: Yes. Refer to sedation procedure documentation for details of sedation.  Attempt one:    Cardioversion mode:   Synchronous   Waveform:  Monophasic   Shock (Joules):  120   Shock outcome:  Conversion to normal sinus rhythm Post-procedure details:    Patient status:  Awake   Patient tolerance of procedure:  Tolerated well, no immediate complications    Medications Ordered in the ED  diltiazem  (CARDIZEM ) 1 mg/mL load via infusion 10 mg (has no administration in time range)    And  diltiazem  (CARDIZEM ) 125 mg in dextrose  5% 125 mL (1 mg/mL) infusion (has no administration in time range)  lactated ringers  bolus 1,000 mL (1,000 mLs Intravenous New Bag/Given 06/17/24 1156)                                    Medical Decision Making 78 year old male with past medical history of hypertension and atrial fibrillation presenting to the emergency department today in atrial fibrillation.  Patient is mostly rate controlled but his heart rate will go up to the 110's to 120s.  I will further evaluate him here with basic labs to evaluate for any electrolyte abnormalities including magnesium  level.  Will start him on diltiazem  as his blood pressures are stable here.  I will discuss case with cardiology if he does not convert back to a sinus rhythm on the diltiazem .  I will reevaluate for ultimate disposition.  He does not have any history of CHF either so we will give him some IV fluids with the diltiazem  to see if he will spontaneously convert.  The patient's labs were largely unremarkable.  His initial troponin was mildly elevated at 35.  This is downtrending at 26 on repeat.  This does not appear consistent with ACS at this time.  I did call and discussed patient's case with Dr. Mona.  Initially the patient was given IV fluids as his blood pressures were in the high 90s and low 100s.  Even before this the patient's heart rate had improved to the 70s and 80s.  I will occasionally go up over 100 but is mostly rate controlled.  Dr. Mona recommended cardioversion for the patient and he is very symptomatic but felt that it  would be reasonable to discharge the patient to continue his Cardizem  and Eliquis  if he was not symptomatic.  The is reporting some symptoms after walking and heart rate is going to low 100s occasionally.  He is requesting cardioversion.  After discussion with Dr. Mona regarding this and with the patient being symptomatic this does seem reasonable.  Will perform cardioversion and plan is for likely discharge after words.  Amount and/or Complexity of Data Reviewed Labs: ordered. Radiology: ordered.  Risk Prescription drug management.        Final diagnoses:  Paroxysmal atrial fibrillation (HCC)  ED Discharge Orders          Ordered    Amb referral to AFIB Clinic        06/17/24 1631    Ambulatory referral to Cardiology       Comments: If you have not heard from the Cardiology office within the next 72 hours please call [phone].   06/17/24 1631               Ula Prentice SAUNDERS, MD 06/17/24 1634    Ula Prentice SAUNDERS, MD 06/17/24 [phone]

## 2024-06-17 NOTE — ED Notes (Signed)
 Pt ambulated 50 feet. O2 before ambulation 96%, O2 afterward 98%. Gait steady.

## 2024-06-17 NOTE — ED Triage Notes (Signed)
 Pt was showing afib at home on his Cardia, went to doctors and they sent him here. Pt denies any chest pain or sob. He did feel some sob and fluttering when he first woke up but not feeling it now.

## 2024-06-17 NOTE — ED Notes (Signed)
 Patient given 120j shock for cardioversion by MD.

## 2024-06-17 NOTE — ED Notes (Signed)
 Patient given 5cc of Etomidate  via PIV by primary RN.

## 2024-06-24 ENCOUNTER — Other Ambulatory Visit: Payer: Self-pay

## 2024-06-28 ENCOUNTER — Other Ambulatory Visit: Payer: Self-pay | Admitting: Nurse Practitioner

## 2024-06-29 ENCOUNTER — Other Ambulatory Visit: Payer: Self-pay

## 2024-06-29 ENCOUNTER — Other Ambulatory Visit: Payer: Self-pay | Admitting: Nurse Practitioner

## 2024-06-29 MED ORDER — DILTIAZEM HCL ER COATED BEADS 120 MG PO CP24: 120.0000 mg | ORAL_CAPSULE | Freq: Every day | ORAL | 1 refills | Status: DC

## 2024-06-29 NOTE — Telephone Encounter (Signed)
 Pt's medication was sent to pt's pharmacy as requested. Confirmation received.

## 2024-06-29 NOTE — Telephone Encounter (Signed)
 Patient left VM on HF triage line requesting refill of diltiazem . Pending refill request with correct office. [facility] to address

## 2024-07-01 ENCOUNTER — Ambulatory Visit (HOSPITAL_COMMUNITY)
Admission: RE | Admit: 2024-07-01 | Discharge: 2024-07-01 | Disposition: A | Discharge status: Home / Self Care | Source: Ambulatory Visit | Attending: Physician Assistant | Admitting: Physician Assistant

## 2024-07-01 ENCOUNTER — Encounter (HOSPITAL_COMMUNITY): Payer: Self-pay | Admitting: Physician Assistant

## 2024-07-01 VITALS — BP 148/90 | HR 63 | Ht 67.0 in | Wt 187.4 lb

## 2024-07-01 DIAGNOSIS — G459 Transient cerebral ischemic attack, unspecified: Secondary | ICD-10-CM | POA: Diagnosis not present

## 2024-07-01 DIAGNOSIS — I480 Paroxysmal atrial fibrillation: Secondary | ICD-10-CM

## 2024-07-01 DIAGNOSIS — I4891 Unspecified atrial fibrillation: Secondary | ICD-10-CM | POA: Diagnosis not present

## 2024-07-01 DIAGNOSIS — D6869 Other thrombophilia: Secondary | ICD-10-CM

## 2024-07-01 DIAGNOSIS — N183 Chronic kidney disease, stage 3 unspecified: Secondary | ICD-10-CM | POA: Diagnosis not present

## 2024-07-01 DIAGNOSIS — I10 Essential (primary) hypertension: Secondary | ICD-10-CM | POA: Diagnosis not present

## 2024-07-01 NOTE — Progress Notes (Signed)
 Primary Care Physician: Dayna Motto, DO Primary Cardiologist: Aleene Passe, MD (Inactive) Electrophysiologist: None  Referring Physician: ED   Logan Wright is a 78 y.o. male with a history of breast cancer s/p mastectomy, CKD, HTN, HLD, CAD, atrial fibrillation who presents for follow up in the [facility]. Patient was diagnosed with afib 07/2020 after presented to the ED with fatigue, SOB, and chest discomfort. ECG showed afib with RVR. He was rate controlled and discharged with plan for outpatient DCCV but spontaneously converted.   He presented to the ED 06/17/24 with rapid afib and underwent DCCV. Patient is on Eliquis  for stroke prevention.    Patient presents today for follow up for atrial fibrillation. He is in SR today and feeling well. No bleeding issues on anticoagulation. He denies significant alcohol use or snoring.   Today, he denies symptoms of palpitations, chest pain, shortness of breath, orthopnea, PND, lower extremity edema, dizziness, presyncope, syncope, snoring, daytime somnolence, bleeding, or neurologic sequela. The patient is tolerating medications without difficulties and is otherwise without complaint today.    Atrial Fibrillation Risk Factors:  he does not have symptoms or diagnosis of sleep apnea. Negative sleep study at time of afib diagnosis.  he does not have a history of rheumatic fever. he does not have a history of alcohol use.   Atrial Fibrillation Management history:  Previous antiarrhythmic drugs: none Previous cardioversions: 06/17/24 Previous ablations: none Anticoagulation history: Eliquis   ROS- All systems are reviewed and negative except as per the HPI above.  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   Echocardiogram 07/2020: 42 mm   Breast cancer (HCC) 2021   right breast   Breast cancer, right (HCC)    CKD (chronic kidney  disease), stage III (HCC)    Colon adenoma 2015   Coronary artery calcification seen on CT scan    Diverticulosis    DENIES    DJD (degenerative joint disease)    Dysrhythmia    A-fib   ED (erectile dysfunction)    Elevated blood pressure 2007-2008   Esophageal reflux    GERD (gastroesophageal reflux disease)    Gout    Gynecomastia    LEFT   Hearing loss    HTN (hypertension)    Hypercholesteremia    Non-cardiac chest pain    OA (osteoarthritis)    LEFT SHOULDER   Onychomycosis of foot with other complication    PAF (paroxysmal atrial fibrillation) (HCC)    Pre-diabetes    Seborrhea    Severe frontal headaches    RESOLVED    Stroke (HCC)    Vertigo    RESOLVED    Wears glasses    Wears hearing aid     Current Outpatient Medications  Medication Sig Dispense Refill   allopurinol  (ZYLOPRIM ) 100 MG tablet Take 200 mg by mouth daily.     apixaban  (ELIQUIS ) 5 MG TABS tablet Take 1 tablet by mouth twice daily 180 tablet 1   aspirin  EC 81 MG tablet Take 81 mg by mouth daily. Swallow whole.     atorvastatin  (LIPITOR ) 80 MG tablet Take 80 mg by mouth at bedtime.     diltiazem  (CARDIZEM  CD) 120 MG 24 hr capsule Take 1 capsule (120 mg total) by mouth daily. 90 capsule 1   diphenhydrAMINE -zinc acetate (BENADRYL ) cream Apply 1 Application topically 3 (three) times daily as needed for itching.  empagliflozin (JARDIANCE) 10 MG TABS tablet Take 10 mg by mouth daily.     GLUCOSAMINE-CHONDROITIN PO Take 2 tablets by mouth daily.     latanoprost (XALATAN) 0.005 % ophthalmic solution Place 1 drop into both eyes at bedtime.     Multiple Vitamin (MULTIVITAMIN) tablet Take 1 tablet by mouth daily.      Omega-3 Fatty Acids (FISH OIL ULTRA) 1400 MG CAPS Take 1,400-2,800 mg by mouth See admin instructions. Take 1400 mg in the morning and 2800 mg in the evening     oxyCODONE  (ROXICODONE ) 5 MG immediate release tablet Take 1 tablet (5 mg total) by mouth every 4 (four) hours as needed for severe  pain (pain score 7-10). 30 tablet 0   Potassium 99 MG TABS Take 1 tablet by mouth daily.     tamoxifen  (NOLVADEX ) 20 MG tablet Take 1 tablet by mouth once daily 90 tablet 3   tiZANidine  (ZANAFLEX ) 4 MG tablet Take 1 tablet (4 mg total) by mouth every 8 (eight) hours as needed for muscle spasms. 30 tablet 1   No current facility-administered medications for this encounter.    Physical Exam: BP (!) 148/90   Pulse 63   Ht 5' 7 (1.702 m)   Wt 85 kg   BMI 29.35 kg/m   GEN: Well nourished, well developed in no acute distress CARDIAC: Regular rate and rhythm, no murmurs, rubs, gallops RESPIRATORY:  Clear to auscultation without rales, wheezing or rhonchi  ABDOMEN: Soft, non-tender, non-distended EXTREMITIES:  No edema; No deformity   Wt Readings from Last 3 Encounters:  07/01/24 85 kg  03/10/24 77.1 kg  02/26/24 77.1 kg     EKG today demonstrates  SR Vent. rate 63 BPM PR interval 154 ms QRS duration 76 ms QT/QTcB 434/444 ms   Echo 05/06/23 demonstrated   1. Left ventricular ejection fraction, by estimation, is 55 to 60%. The  left ventricle has normal function. The left ventricle has no regional  wall motion abnormalities. Left ventricular diastolic parameters were  normal.   2. Right ventricular systolic function is normal. The right ventricular  size is normal. There is normal pulmonary artery systolic pressure.   3. The mitral valve is grossly normal. Trivial mitral valve  regurgitation. No evidence of mitral stenosis.   4. The aortic valve is grossly normal. There is mild calcification of the  aortic valve. There is mild thickening of the aortic valve. Aortic valve  regurgitation is not visualized. Aortic valve sclerosis/calcification is  present, without any evidence of aortic stenosis.   5. Aortic dilatation noted. There is mild dilatation of the ascending  aorta, measuring 42 mm.   6. The inferior vena cava is normal in size with greater than 50%  respiratory  variability, suggesting right atrial pressure of 3 mmHg.   Comparison(s): No significant change from prior study.   Conclusion(s)/Recommendation(s): No intracardiac source of embolism  detected on this transthoracic study. Consider a transesophageal  echocardiogram to exclude cardiac source of embolism if clinically  indicated.     CHA2DS2-VASc Score = 6  The patient's score is based upon: CHF History: 0 HTN History: 1 Diabetes History: 0 Stroke History: 2 Vascular Disease History: 1 Age Score: 2 Gender Score: 0       ASSESSMENT AND PLAN: Paroxysmal Atrial Fibrillation (ICD10:  I48.0) The patient's CHA2DS2-VASc score is 6, indicating a 9.7% annual risk of stroke.   S/p DCCV 06/17/24 Patient appears to be maintaining SR. We discussed rhythm control options including watchful  waiting, AAD, and ablation. Given this is his first episode in 4 years, will pursue watchful waiting for now.  Continue Eliquis  5 mg BID Continue diltiazem  120 mg daily Kardia mobile for home monitoring.   Secondary Hypercoagulable State (ICD10:  D68.69) The patient is at significant risk for stroke/thromboembolism based upon his CHA2DS2-VASc Score of 6.  Continue Apixaban  (Eliquis ). No bleeding issues.   HTN Mildly elevated today, historically better controlled. No changes today, continue to monitor.   CAD/aortic atherosclerosis Noted on CT 2021 Followed by primary cardiology team   Follow up with Dr Okey per recall. AF clinic as needed.    [facility] [address] [place], [place] [address] [phone]

## 2024-07-11 DIAGNOSIS — H25042 Posterior subcapsular polar age-related cataract, left eye: Secondary | ICD-10-CM | POA: Diagnosis not present

## 2024-07-11 DIAGNOSIS — H401231 Low-tension glaucoma, bilateral, mild stage: Secondary | ICD-10-CM | POA: Diagnosis not present

## 2024-07-11 DIAGNOSIS — H2512 Age-related nuclear cataract, left eye: Secondary | ICD-10-CM | POA: Diagnosis not present

## 2024-07-12 ENCOUNTER — Inpatient Hospital Stay: Payer: PPO | Attending: Hematology and Oncology | Admitting: Hematology and Oncology

## 2024-07-12 DIAGNOSIS — C50421 Malignant neoplasm of upper-outer quadrant of right male breast: Secondary | ICD-10-CM | POA: Insufficient documentation

## 2024-07-12 DIAGNOSIS — Z170 Estrogen receptor positive status [ER+]: Secondary | ICD-10-CM | POA: Diagnosis not present

## 2024-07-12 DIAGNOSIS — I4891 Unspecified atrial fibrillation: Secondary | ICD-10-CM | POA: Diagnosis not present

## 2024-07-12 DIAGNOSIS — Z79810 Long term (current) use of selective estrogen receptor modulators (SERMs): Secondary | ICD-10-CM | POA: Diagnosis not present

## 2024-07-12 DIAGNOSIS — I10 Essential (primary) hypertension: Secondary | ICD-10-CM | POA: Diagnosis not present

## 2024-07-12 DIAGNOSIS — G459 Transient cerebral ischemic attack, unspecified: Secondary | ICD-10-CM | POA: Diagnosis not present

## 2024-07-12 DIAGNOSIS — N183 Chronic kidney disease, stage 3 unspecified: Secondary | ICD-10-CM | POA: Diagnosis not present

## 2024-07-12 NOTE — Assessment & Plan Note (Signed)
 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  Breast exam 07/12/2024: Benign 2. 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

## 2024-07-12 NOTE — Progress Notes (Signed)
 Patient Care Team: Dayna Motto, DO as PCP - General (Family Medicine) [name], Aleene PARAS, MD (Inactive) as PCP - Cardiology (Cardiology) Vernetta Berg, MD as Consulting Physician (General Surgery) Dewey Rush, MD as Consulting Physician (Radiation Oncology) Livingston Rigg, MD as Consulting Physician (Dermatology) Ernie Cough, MD as Consulting Physician (Orthopedic Surgery) Tyree Nanetta SAILOR, RN as Oncology Nurse Navigator Magda Debby SAILOR, MD as Consulting Physician (Vascular Surgery)  DIAGNOSIS:  Encounter Diagnosis  Name Primary?   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  The Common Hereditary Gene Panel offered by [facility] includes sequencing and/or deletion duplication testing of the following 48 genes: APC, ATM, AXIN2, BARD1, BMPR1A, BRCA1, BRCA2, BRIP1, CDH1, CDK4, CDKN2A (p14ARF), CDKN2A (p16INK4a), CHEK2, CTNNA1, DICER1, EPCAM (Deletion/duplication testing only), GREM1 (promoter region deletion/duplication testing only), KIT, MEN1, MLH1, MSH2, MSH3, MSH6, MUTYH, NBN, NF1, NHTL1, PALB2, PDGFRA, PMS2, POLD1, POLE, PTEN, RAD50, RAD51C, RAD51D, RNF43, SDHB, SDHC, SDHD, SMAD4, SMARCA4. STK11, TP53, TSC1, TSC2, and VHL.  The following genes were evaluated for sequence changes only: SDHA and HOXB13  c.251G>A variant only. The report date is 05/14/2020.   05/17/2020 - 10/11/2020 Chemotherapy   Patient is on Treatment Plan : BREAST Adjuvant CMF IV q21d       CHIEF COMPLIANT: Follow-up on tamoxifen  therapy  HISTORY OF PRESENT ILLNESS:  History of Present Illness Logan Wright is a 78 year Wright male with breast cancer who presents for follow-up on tamoxifen   He experienced atrial fibrillation shortly after completing chemotherapy, resolved with cardioversion. He now uses a one-lead cardiac monitoring device and has not had further episodes. His blood pressure is well-managed.  He has a persistent burning sensation in a specific area for six to eight months, with a second area developing three months ago. This sensation causes occasional mild pain but does not significantly impact his life. Multiple specialists have been consulted without a definitive cause identified.  Current medications include Jardiance, diltiazem , atorvastatin , and tamoxifen . He reports no side effects from tamoxifen , though he experiences minor muscle cramps at night in his upper thighs.  He is actively managing his weight, having lost and regained some, and is now working to reduce it again. He maintains a regular exercise routine, emphasizing the importance of physical activity for his health.     ALLERGIES:  has no known allergies.  MEDICATIONS:  Current Outpatient Medications  Medication Sig Dispense Refill   allopurinol  (ZYLOPRIM ) 100 MG tablet Take 200 mg by mouth daily.     apixaban  (ELIQUIS ) 5 MG TABS tablet Take 1 tablet by mouth twice daily 180 tablet 1   aspirin  EC 81 MG tablet Take 81 mg by mouth daily. Swallow whole.     atorvastatin  (LIPITOR ) 80 MG tablet Take 80 mg by mouth at bedtime.     diltiazem  (CARDIZEM  CD) 120 MG 24 hr capsule Take 1 capsule (120 mg  total) by mouth daily. 90 capsule 1   diphenhydrAMINE -zinc acetate (BENADRYL ) cream Apply 1 Application topically 3 (three) times daily as needed  for itching.     empagliflozin (JARDIANCE) 10 MG TABS tablet Take 10 mg by mouth daily.     GLUCOSAMINE-CHONDROITIN PO Take 2 tablets by mouth daily.     latanoprost (XALATAN) 0.005 % ophthalmic solution Place 1 drop into both eyes at bedtime.     Multiple Vitamin (MULTIVITAMIN) tablet Take 1 tablet by mouth daily.      Omega-3 Fatty Acids (FISH OIL ULTRA) 1400 MG CAPS Take 1,400-2,800 mg by mouth See admin instructions. Take 1400 mg in the morning and 2800 mg in the evening     Potassium 99 MG TABS Take 1 tablet by mouth daily.     tamoxifen  (NOLVADEX ) 20 MG tablet Take 1 tablet by mouth once daily 90 tablet 3   No current facility-administered medications for this visit.    PHYSICAL EXAMINATION: ECOG PERFORMANCE STATUS: 1 - Symptomatic but completely ambulatory  There were no vitals filed for this visit. There were no vitals filed for this visit.  Physical Exam No pallor, lumps or nodules in bilateral chest walls or axilla  (exam performed in the presence of a chaperone)  LABORATORY DATA:  I have reviewed the data as listed    Latest Ref Rng & Units 06/17/2024   11:22 AM 02/26/2024   11:20 AM 05/15/2023    4:40 AM  CMP  Glucose 70 - 99 mg/dL 882  86  96   BUN 8 - 23 mg/dL 20  27  21    Creatinine 0.61 - 1.24 mg/dL 8.68  8.76  8.72   Sodium 135 - 145 mmol/L 141  138  137   Potassium 3.5 - 5.1 mmol/L 4.1  4.2  3.9   Chloride 98 - 111 mmol/L 110  105  107   CO2 22 - 32 mmol/L 21  25  21    Calcium  8.9 - 10.3 mg/dL 8.9  8.4  7.7     Lab Results  Component Value Date   WBC 8.6 06/17/2024   HGB 14.6 06/17/2024   HCT 46.5 06/17/2024   MCV 84.5 06/17/2024   PLT 240 06/17/2024   NEUTROABS 4.9 05/05/2023    ASSESSMENT & PLAN:  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  Breast exam 07/12/2024: Benign 2. 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  He will think about it and will inform us . Return to clinic in 1 year for follow-up ------------------------------------- Assessment and Plan Assessment & Plan Malignant neoplasm of upper-outer quadrant of right male breast, in remission In remission. Higher recurrence risk due to oncotype despite chemotherapy. - Discussed Gardant blood test for circulating tumor DNA for recurrence monitoring. Not in guidelines but covered by most insurances. Could detect recurrence earlier than scans. Prefers to discuss with wife before deciding. - Provide brochure on Gardant blood test. - Discuss Gardant test with wife  and inform provider of decision.  Atrial fibrillation, status post cardioversion Atrial fibrillation post-chemotherapy treated with cardioversion. Monitoring with two-lead device, no further episodes.  Paresthesias, multifocal Burning sensation for 6-8 months, possibly medication-related. Not significantly affecting quality of life. - Prefers to live with it rather than alter medication regimen.  Muscle cramps Minor muscle cramps at night, particularly in upper thighs. Not significantly impacting quality of life.  General Health Maintenance Maintaining blood pressure control, monitoring weight, engaging in regular physical activity.  Follow-up Plan to follow up in one year unless decision to proceed with Gardant test or new concerns arise. - Schedule follow-up appointment in one  year.      No orders of the defined types were placed in this encounter.  The patient has a good understanding of the overall plan. he agrees with it. he will call with any problems that may develop before the next visit here. Total time spent: 30 mins including face to face time and time spent for planning, charting and co-ordination of care   Viinay K Esmae Donathan, MD 07/12/24

## 2024-07-19 ENCOUNTER — Ambulatory Visit: Attending: Vascular Surgery | Admitting: Vascular Surgery

## 2024-07-19 ENCOUNTER — Ambulatory Visit (HOSPITAL_COMMUNITY)
Admission: RE | Admit: 2024-07-19 | Discharge: 2024-07-19 | Disposition: A | Discharge status: Home / Self Care | Source: Ambulatory Visit | Attending: Vascular Surgery | Admitting: Vascular Surgery

## 2024-07-19 ENCOUNTER — Encounter: Payer: Self-pay | Admitting: Vascular Surgery

## 2024-07-19 VITALS — BP 127/70 | HR 52 | Ht 67.0 in | Wt 185.0 lb

## 2024-07-19 DIAGNOSIS — Z95828 Presence of other vascular implants and grafts: Secondary | ICD-10-CM | POA: Diagnosis not present

## 2024-07-19 DIAGNOSIS — I6523 Occlusion and stenosis of bilateral carotid arteries: Secondary | ICD-10-CM | POA: Diagnosis not present

## 2024-07-19 DIAGNOSIS — I6521 Occlusion and stenosis of right carotid artery: Secondary | ICD-10-CM | POA: Insufficient documentation

## 2024-07-19 NOTE — Progress Notes (Signed)
 [facility] [place]  ASSESSMENT / PLAN: 78 y.o. male status post right TCAR 05/14/2023 for symptomatic carotid artery stenosis.  He has done very well since surgery. He should continue aspirin  and Eliquis  indefinitely.  He should continue high intensity statin therapy indefinitely.  I will see him again in 1 year with repeat carotid duplex  CHIEF COMPLAINT: Surgery follow-up  HISTORY OF PRESENT ILLNESS: Logan Wright is a 78 y.o. male  who presented to the hospital with slurred speech, left facial droop, which lasted about 15 seconds and resolved.  He had similar sx on a cruise in Early July and he states it resolved as quickly as he could tell his wife.  He followed up with his PCP and an MRI of the brain was ordered, which revealed a small acute to early subacute infarct in the right parietal sub-cortical white matter.   He did have a CTA of the head/neck on 05/05/2023, which revealed ~ 80% stenosis of the proximal right cervical ICA.  He did not have any other sx.   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    Pt has hx of afib and on Eliquis .  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  hx of CAD, HLD, HTN and breast cancer.   He has hx of elevated creatinine in the past but creatinine today is 1.18.  He states that after his mastectomy, he did have some afib and on Eliquis .  He checks his heart rate and he has not seen any further afib.  He has cramping in at night but does not endorse claudication.  He and his wife of 55 years are very active and work out regularly.     He states that his parents have hx of CAD and stroke but lived to be in late 42's and 100.    The pt is on a statin for cholesterol management.  The pt is on a daily aspirin .   Other AC:  Eliquis  The pt is on CCB for hypertension.   The pt is not on medication for diabetes PTA. Tobacco  hx:  never  He underwent R TCAR 05/14/23. He tolerated this well. He was discharged POD#2 because of hypotension.   06/15/23: Patient returns to clinic for surgical follow-up.  He tolerated right TCAR very well.  He is noticed exertional dyspnea and lethargy since surgery.  No or focal neurologic symptoms reported today.  No incisional problems.  07/19/24: Returns for surveillance of carotid artery stenosis.  Doing well overall.  No stroke or TIA symptoms.  We reviewed his duplex in detail.  Past Medical History:  Diagnosis Date   Ascending aorta dilatation    Ascending aorta dilation 03/30/2023   Echocardiogram 07/2020: 42 mm   Breast cancer (HCC) 2021   right breast   Breast cancer, right (HCC)    CKD (chronic kidney disease), stage III (HCC)    Colon adenoma 2015   Coronary artery calcification seen on CT scan    Diverticulosis    DENIES    DJD (degenerative joint disease)    Dysrhythmia    A-fib   ED (erectile dysfunction)    Elevated blood pressure 2007-2008   Esophageal reflux    GERD (gastroesophageal reflux disease)    Gout    Gynecomastia    LEFT   Hearing loss    HTN (hypertension)  Hypercholesteremia    Non-cardiac chest pain    OA (osteoarthritis)    LEFT SHOULDER   Onychomycosis of foot with other complication    PAF (paroxysmal atrial fibrillation) (HCC)    Pre-diabetes    Seborrhea    Severe frontal headaches    RESOLVED    Stroke (HCC)    Vertigo    RESOLVED    Wears glasses    Wears hearing aid     Past Surgical History:  Procedure Laterality Date   COLONSCOPY      HIP ARTHROPLASTY Bilateral    AT [place]    IR CV LINE INJECTION  05/14/2020   IR IMAGING GUIDED PORT INSERTION  05/17/2020   IR REMOVAL TUN ACCESS W/ PORT W/O FL MOD SED  05/17/2020   KNEE ARTHROSCOPY     UNSURE WHICH KNEE    MASTECTOMY Right 03/16/2020   MASTECTOMY W/ SENTINEL NODE BIOPSY Right 03/16/2020   Procedure: RIGHT BREAST MASTECTOMY WITH SENTINEL LYMPH NODE BIOPSY;   Surgeon: Vernetta Berg, MD;  Location: [facility];  Service: General;  Laterality: Right;   PORTACATH PLACEMENT Left 05/08/2020   Procedure: INSERTION PORT-A-CATH WITH ULTRASOUND;  Surgeon: Vernetta Berg, MD;  Location: [facility] OR;  Service: General;  Laterality: Left;  LMA   REVERSE SHOULDER ARTHROPLASTY Right 03/10/2024   Procedure: ARTHROPLASTY, SHOULDER, TOTAL, REVERSE;  Surgeon: Dozier Soulier, MD;  Location: [facility];  Service: Orthopedics;  Laterality: Right;   TOTAL KNEE ARTHROPLASTY Left 11/23/2018   Procedure: TOTAL KNEE ARTHROPLASTY;  Surgeon: Ernie Cough, MD;  Location: [facility];  Service: Orthopedics;  Laterality: Left;  70 mins   TRANSCAROTID ARTERY REVASCULARIZATION  Right 05/14/2023   Procedure: Transcarotid Artery Revascularization;  Surgeon: Magda Debby SAILOR, MD;  Location: [facility] OR;  Service: Vascular;  Laterality: Right;   ULTRASOUND GUIDANCE FOR VASCULAR ACCESS Left 05/14/2023   Procedure: ULTRASOUND GUIDANCE FOR VASCULAR ACCESS;  Surgeon: Magda Debby SAILOR, MD;  Location: [facility] OR;  Service: Vascular;  Laterality: Left;    Family History  Problem Relation Age of Onset   Heart attack Mother    Stroke Mother    Heart failure Father        42   Hypertension Neg Hx    Breast cancer Neg Hx     Social History   Socioeconomic History   Marital status: Married    Spouse name: Not on file   Number of children: Not on file   Years of education: Not on file   Highest education level: Not on file  Occupational History   Not on file  Tobacco Use   Smoking status: Never   Smokeless tobacco: Never   Tobacco comments:    Never smoked 07/01/24  Vaping Use   Vaping status: Never Used  Substance and Sexual Activity   Alcohol use: No    Alcohol/week: 0.0 standard drinks of alcohol   Drug use: No   Sexual activity: Not on file  Other Topics Concern   Not on file  Social History Narrative   Not on file   Social Drivers of Health   Financial Resource Strain:  Not on file  Food Insecurity: No Food Insecurity (05/05/2023)   Hunger Vital Sign    Worried About Running Out of Food in the Last Year: Never true    Ran Out of Food in the Last Year: Never true  Transportation Needs: No Transportation Needs (05/05/2023)   PRAPARE - Administrator, Civil Service (Medical):  No    Lack of Transportation (Non-Medical): No  Physical Activity: Not on file  Stress: Not on file  Social Connections: Not on file  Intimate Partner Violence: Not At Risk (05/05/2023)   Humiliation, Afraid, Rape, and Kick questionnaire    Fear of Current or Ex-Partner: No    Emotionally Abused: No    Physically Abused: No    Sexually Abused: No    No Known Allergies  Current Outpatient Medications  Medication Sig Dispense Refill   allopurinol  (ZYLOPRIM ) 100 MG tablet Take 200 mg by mouth daily.     apixaban  (ELIQUIS ) 5 MG TABS tablet Take 1 tablet by mouth twice daily 180 tablet 1   aspirin  EC 81 MG tablet Take 81 mg by mouth daily. Swallow whole.     atorvastatin  (LIPITOR ) 80 MG tablet Take 80 mg by mouth at bedtime.     diltiazem  (CARDIZEM  CD) 120 MG 24 hr capsule Take 1 capsule (120 mg total) by mouth daily. 90 capsule 1   diphenhydrAMINE -zinc acetate (BENADRYL ) cream Apply 1 Application topically 3 (three) times daily as needed for itching.     empagliflozin (JARDIANCE) 10 MG TABS tablet Take 10 mg by mouth daily.     GLUCOSAMINE-CHONDROITIN PO Take 2 tablets by mouth daily.     latanoprost (XALATAN) 0.005 % ophthalmic solution Place 1 drop into both eyes at bedtime.     Multiple Vitamin (MULTIVITAMIN) tablet Take 1 tablet by mouth daily.      Omega-3 Fatty Acids (FISH OIL ULTRA) 1400 MG CAPS Take 1,400-2,800 mg by mouth See admin instructions. Take 1400 mg in the morning and 2800 mg in the evening     Potassium 99 MG TABS Take 1 tablet by mouth daily.     tamoxifen  (NOLVADEX ) 20 MG tablet Take 1 tablet by mouth once daily 90 tablet 3   No current  facility-administered medications for this visit.    PHYSICAL EXAM Vitals:   07/19/24 1513  BP: 127/70  Pulse: (!) 52  SpO2: 93%  Weight: 185 lb (83.9 kg)  Height: 5' 7 (1.702 m)    Well-appearing elderly man in no acute distress Regular rate and rhythm Unlabored breathing Right neck incision well-healed    PERTINENT LABORATORY AND RADIOLOGIC DATA  Most recent CBC    Latest Ref Rng & Units 06/17/2024   11:22 AM 02/26/2024   11:20 AM 05/15/2023    4:40 AM  CBC  WBC 4.0 - 10.5 K/uL 8.6  7.7  8.7   Hemoglobin 13.0 - 17.0 g/dL 85.3  85.4  89.3   Hematocrit 39.0 - 52.0 % 46.5  45.7  32.2   Platelets 150 - 400 K/uL 240  217  174      Most recent CMP    Latest Ref Rng & Units 06/17/2024   11:22 AM 02/26/2024   11:20 AM 05/15/2023    4:40 AM  CMP  Glucose 70 - 99 mg/dL 882  86  96   BUN 8 - 23 mg/dL 20  27  21    Creatinine 0.61 - 1.24 mg/dL 8.68  8.76  8.72   Sodium 135 - 145 mmol/L 141  138  137   Potassium 3.5 - 5.1 mmol/L 4.1  4.2  3.9   Chloride 98 - 111 mmol/L 110  105  107   CO2 22 - 32 mmol/L 21  25  21    Calcium  8.9 - 10.3 mg/dL 8.9  8.4  7.7     Renal function CrCl  cannot be calculated (Patient's most recent lab result is older than the maximum 21 days allowed.).  Hgb A1c MFr Bld (%)  Date Value  05/06/2023 5.5    LDL Cholesterol  Date Value Ref Range Status  05/06/2023 76 0 - 99 mg/dL Final    Comment:           Total Cholesterol/HDL:CHD Risk Coronary Heart Disease Risk Table                     Men   Women  1/2 Average Risk   3.4   3.3  Average Risk       5.0   4.4  2 X Average Risk   9.6   7.1  3 X Average Risk  23.4   11.0        Use the calculated Patient Ratio above and the CHD Risk Table to determine the patient's CHD Risk.        ATP III CLASSIFICATION (LDL):  <100     mg/dL   Optimal  899-870  mg/dL   Near or Above                    Optimal  130-159  mg/dL   Borderline  839-810  mg/dL   High  >809     mg/dL   Very High Performed at  [facility], [address], [place], [place] [address]     Carotid Arterial Duplex Study  Patient Name:  Logan Wright  Date of Exam:   07/19/2024 Medical Rec #: [id number]       Accession #:    [id number] Date of Birth: 1946/05/27       Patient Gender: M Patient Age:   89 years Exam Location:  [address] Procedure:      VAS US  CAROTID Referring Phys: DEBBY ROBERTSON   --------------------------------------------------------------------------- -----   Indications:       Carotid artery disease, Right stent and patient denies any                    cerebrovascular symptoms. Risk Factors:      Hypertension, hyperlipidemia, no history of smoking. TIA. Other Factors:     Right transcarotid artery revascularization (TCAR) on                    05/14/2023. Comparison Study:  On 06/12/2023, a carotid duplex showed velocities of 57/13                    cm/s in the RICA, s/p stent placement and 62/21 cm/s in the                    LICA.  Performing Technologist: Nanetta Shad RVT    Examination Guidelines: A complete evaluation includes B-mode imaging, spectral Doppler, color Doppler, and power Doppler as needed of all accessible portions of each vessel. Bilateral testing is considered an integral part of a complete examination. Limited examinations for reoccurring indications may be performed as noted.    Right Carotid Findings: +--------+--------+--------+--------+------------------+---------+         PSV cm/sEDV cm/sStenosisPlaque DescriptionComments  +--------+--------+--------+--------+------------------+---------+ CCA Prox124     15                                          +--------+--------+--------+--------+------------------+---------+  ICA Mid 39      12                                          +--------+--------+--------+--------+------------------+---------+ ECA     145     8                                  turbulent +--------+--------+--------+--------+------------------+---------+  +----------+--------+-------+---------+-------------------+           PSV cm/sEDV cmsDescribe Arm Pressure (mmHG) +----------+--------+-------+---------+-------------------+ [id number]     0      Turbulent117                 +----------+--------+-------+---------+-------------------+  +---------+--------+--+--------+--+---------+ VertebralPSV cm/s46EDV cm/s11Antegrade +---------+--------+--+--------+--+---------+     Right Stent(s): +------------------------+--------+--------+--------+--------+--------+ Distal CCA to Distal ICAPSV cm/sEDV cm/sStenosisWaveformComments +------------------------+--------+--------+--------+--------+--------+ Prox to Stent           51      13                               +------------------------+--------+--------+--------+--------+--------+ Proximal Stent          64      14                               +------------------------+--------+--------+--------+--------+--------+ Mid Stent               62      18                               +------------------------+--------+--------+--------+--------+--------+ Distal Stent            75      21                               +------------------------+--------+--------+--------+--------+--------+ Distal to Stent         67      19                               +------------------------+--------+--------+--------+--------+--------+         Left Carotid Findings: +----------+--------+--------+--------+------------------+----------------- -+           PSV cm/sEDV cm/sStenosisPlaque DescriptionComments           +----------+--------+--------+--------+------------------+----------------- -+ CCA Prox  84      11                                                   +----------+--------+--------+--------+------------------+----------------- -+ CCA  Distal69      14                                                   +----------+--------+--------+--------+------------------+----------------- -+ ICA Prox  81      19      1-39%  heterogenous                         +----------+--------+--------+--------+------------------+----------------- -+ ICA Distal63      22                                                   +----------+--------+--------+--------+------------------+----------------- -+ ECA       89      8                                 intimal thickening +----------+--------+--------+--------+------------------+----------------- -+  +----------+--------+--------+----------------+-------------------+           PSV cm/sEDV cm/sDescribe        Arm Pressure (mmHG) +----------+--------+--------+----------------+-------------------+ Subclavian103     0       Multiphasic, TWO875                 +----------+--------+--------+----------------+-------------------+  +---------+--------+--+--------+--+---------+ VertebralPSV cm/s51EDV cm/s17Antegrade +---------+--------+--+--------+--+---------+       Summary: Right Carotid: Widely patent distal CCA to distal ICA stent without evidence of                stenosis.  Left Carotid: Velocities in the left ICA are consistent with a 1-39% stenosis.               Hemodynamically significant plaque >50% visualized in the CCA.  Vertebrals:  Bilateral vertebral arteries demonstrate antegrade flow. Subclavians: Right subclavian artery flow was disturbed. Normal flow              hemodynamics were seen in the left subclavian artery.  *See table(s) above for measurements and observations.    Debby SAILOR. Magda, MD [facility] Phone Number: [phone] 07/19/2024 4:36 PM   Total time spent on preparing this encounter including chart review, data review, collecting history, examining the patient,  coordinating care for this established patient, 30 minutes.  Portions of this report may have been transcribed using voice recognition software.  Every effort has been made to ensure accuracy; however, inadvertent computerized transcription errors may still be present.

## 2024-08-01 DIAGNOSIS — Z96611 Presence of right artificial shoulder joint: Secondary | ICD-10-CM | POA: Diagnosis not present

## 2024-08-01 DIAGNOSIS — M25511 Pain in right shoulder: Secondary | ICD-10-CM | POA: Diagnosis not present

## 2024-08-01 DIAGNOSIS — R531 Weakness: Secondary | ICD-10-CM | POA: Diagnosis not present

## 2024-08-01 DIAGNOSIS — M25611 Stiffness of right shoulder, not elsewhere classified: Secondary | ICD-10-CM | POA: Diagnosis not present

## 2024-08-06 ENCOUNTER — Encounter (HOSPITAL_BASED_OUTPATIENT_CLINIC_OR_DEPARTMENT_OTHER): Payer: Self-pay

## 2024-08-06 ENCOUNTER — Other Ambulatory Visit: Payer: Self-pay

## 2024-08-06 ENCOUNTER — Emergency Department (HOSPITAL_BASED_OUTPATIENT_CLINIC_OR_DEPARTMENT_OTHER)
Admission: EM | Admit: 2024-08-06 | Discharge: 2024-08-06 | Disposition: A | Discharge status: Home / Self Care | Source: Ambulatory Visit | Attending: Emergency Medicine | Admitting: Emergency Medicine

## 2024-08-06 DIAGNOSIS — I4891 Unspecified atrial fibrillation: Secondary | ICD-10-CM

## 2024-08-06 DIAGNOSIS — I480 Paroxysmal atrial fibrillation: Secondary | ICD-10-CM | POA: Insufficient documentation

## 2024-08-06 DIAGNOSIS — N1831 Chronic kidney disease, stage 3a: Secondary | ICD-10-CM | POA: Diagnosis not present

## 2024-08-06 DIAGNOSIS — R42 Dizziness and giddiness: Secondary | ICD-10-CM | POA: Diagnosis present

## 2024-08-06 DIAGNOSIS — Z7982 Long term (current) use of aspirin: Secondary | ICD-10-CM | POA: Insufficient documentation

## 2024-08-06 DIAGNOSIS — N189 Chronic kidney disease, unspecified: Secondary | ICD-10-CM | POA: Diagnosis not present

## 2024-08-06 DIAGNOSIS — Z79899 Other long term (current) drug therapy: Secondary | ICD-10-CM | POA: Insufficient documentation

## 2024-08-06 DIAGNOSIS — I129 Hypertensive chronic kidney disease with stage 1 through stage 4 chronic kidney disease, or unspecified chronic kidney disease: Secondary | ICD-10-CM | POA: Diagnosis not present

## 2024-08-06 LAB — BASIC METABOLIC PANEL WITH GFR
Anion gap: 12 (ref 5–15)
BUN: 27 mg/dL — ABNORMAL HIGH (ref 8–23)
CO2: 22 mmol/L (ref 22–32)
Calcium: 9.5 mg/dL (ref 8.9–10.3)
Chloride: 109 mmol/L (ref 98–111)
Creatinine, Ser: 1.42 mg/dL — ABNORMAL HIGH (ref 0.61–1.24)
GFR, Estimated: 50 mL/min — ABNORMAL LOW (ref >60)
Glucose, Bld: 118 mg/dL — ABNORMAL HIGH (ref 70–99)
Potassium: 4.1 mmol/L (ref 3.5–5.1)
Sodium: 143 mmol/L (ref 135–145)

## 2024-08-06 LAB — CBC
HCT: 43.8 % (ref 39.0–52.0)
Hemoglobin: 14.1 g/dL (ref 13.0–17.0)
MCH: 26.8 pg (ref 26.0–34.0)
MCHC: 32.2 g/dL (ref 30.0–36.0)
MCV: 83.3 fL (ref 80.0–100.0)
Platelets: 237 K/uL (ref 150–400)
RBC: 5.26 MIL/uL (ref 4.22–5.81)
RDW: 15.1 % (ref 11.5–15.5)
WBC: 9.8 K/uL (ref 4.0–10.5)
nRBC: 0 % (ref 0.0–0.2)

## 2024-08-06 NOTE — ED Notes (Signed)
 Pt placed on Cardiac Pads due to Pt's heart rate currently being in the 180's, Pt being lethargic, pale and diaphoretic. Unable to get a Blood Pressure due to Arm Restrictions and RN's trying to get an IV.

## 2024-08-06 NOTE — ED Provider Notes (Signed)
 [place] EMERGENCY DEPARTMENT AT [facility] Provider Note   CSN: [id number] Arrival date & time: 08/06/24  1225     Patient presents with: Dizziness and Tachycardia   Logan Wright is a 78 y.o. male.   Pt with hx afib, presents feeling as if in afib/rvr this AM. Denies chest pain or discomfort. No sob. No syncope. No recent febrile illness. Compliant w normal meds, including eliquis , and no recent change in meds.  When heart rate was very high, felt transiently lightheaded - otherwise denies any recent new or current symptoms. Pt indicates took his home rate control med, symptoms persisted, and so comes to ED.  No longer feels lightheaded now.  Shortly after arrival to room, monitor, iv/lab draw - converted to NSR.  The history is provided by the patient, the spouse and medical records.  Dizziness Associated symptoms: palpitations   Associated symptoms: no chest pain, no headaches, no nausea, no shortness of breath, no vomiting and no weakness        Prior to Admission medications   Medication Sig Start Date End Date Taking? Authorizing Provider  allopurinol  (ZYLOPRIM ) 100 MG tablet Take 200 mg by mouth daily.    [provider]  apixaban  (ELIQUIS ) 5 MG TABS tablet Take 1 tablet by mouth twice daily 03/22/24   [name], Aleene PARAS, MD  aspirin  EC 81 MG tablet Take 81 mg by mouth daily. Swallow whole.    [provider]  atorvastatin  (LIPITOR ) 80 MG tablet Take 80 mg by mouth at bedtime.    [provider]  diltiazem  (CARDIZEM  CD) 120 MG 24 hr capsule Take 1 capsule (120 mg total) by mouth daily. 06/29/24   Dick, Ernest H Jr., NP  diphenhydrAMINE -zinc acetate (BENADRYL ) cream Apply 1 Application topically 3 (three) times daily as needed for itching.    [provider]  empagliflozin (JARDIANCE) 10 MG TABS tablet Take 10 mg by mouth daily.    [provider]  GLUCOSAMINE-CHONDROITIN PO Take 2 tablets by mouth daily.    [provider]  latanoprost (XALATAN) 0.005 % ophthalmic solution Place 1 drop into both eyes at bedtime. 12/22/23   [provider]  Multiple Vitamin (MULTIVITAMIN) tablet Take 1 tablet by mouth daily.     [provider]  Omega-3 Fatty Acids (FISH OIL ULTRA) 1400 MG CAPS Take 1,400-2,800 mg by mouth See admin instructions. Take 1400 mg in the morning and 2800 mg in the evening    [provider]  Potassium 99 MG TABS Take 1 tablet by mouth daily.    [provider]  tamoxifen  (NOLVADEX ) 20 MG tablet Take 1 tablet by mouth once daily 12/28/23   Gudena, Vinay, MD    Allergies: Patient has no known allergies.    Review of Systems  Constitutional:  Negative for chills and fever.  HENT:  Negative for sore throat.   Respiratory:  Negative for cough and shortness of breath.   Cardiovascular:  Positive for palpitations. Negative for chest pain and leg swelling.  Gastrointestinal:  Negative for abdominal pain, nausea and vomiting.  Genitourinary:  Negative for dysuria and flank pain.  Musculoskeletal:  Negative for back pain and neck pain.  Neurological:  Positive for light-headedness. Negative for syncope, speech difficulty, weakness, numbness and headaches.    Updated Vital Signs BP 107/67   Pulse 65   Temp 98.5 F (36.9 C)   Resp 19   Ht 1.702 m (5' 7)   Wt 83.9 kg  SpO2 91%   BMI 28.97 kg/m   Physical Exam Vitals and nursing note reviewed.  Constitutional:      Appearance: Normal appearance. He is well-developed.  HENT:     Head: Atraumatic.     Nose: Nose normal.     Mouth/Throat:     Mouth: Mucous membranes are moist.     Pharynx: Oropharynx is clear.  Eyes:     General: No scleral icterus.    Conjunctiva/sclera: Conjunctivae normal.  Neck:     Trachea: No tracheal deviation.     Comments: Trachea midline. Thyroid  not grossly enlarged or tender.  Cardiovascular:     Rate and Rhythm: Normal rate and regular rhythm.     Pulses:  Normal pulses.     Heart sounds: Normal heart sounds. No murmur heard.    No friction rub. No gallop.  Pulmonary:     Effort: Pulmonary effort is normal. No accessory muscle usage or respiratory distress.     Breath sounds: Normal breath sounds.  Abdominal:     General: Bowel sounds are normal. There is no distension.     Palpations: Abdomen is soft.     Tenderness: There is no abdominal tenderness.  Musculoskeletal:        General: No swelling or tenderness.     Cervical back: Normal range of motion and neck supple. No rigidity.     Right lower leg: No edema.     Left lower leg: No edema.  Skin:    General: Skin is warm and dry.     Findings: No rash.  Neurological:     Mental Status: He is alert.     Comments: Alert, speech clear. Motor/sens grossly intact bil. Steady gait.   Psychiatric:        Mood and Affect: Mood normal.     (all labs ordered are listed, but only abnormal results are displayed) Results for orders placed or performed during the hospital encounter of 08/06/24  CBC   Collection Time: 08/06/24 12:52 PM  Result Value Ref Range   WBC 9.8 4.0 - 10.5 K/uL   RBC 5.26 4.22 - 5.81 MIL/uL   Hemoglobin 14.1 13.0 - 17.0 g/dL   HCT 56.1 60.9 - 47.9 %   MCV 83.3 80.0 - 100.0 fL   MCH 26.8 26.0 - 34.0 pg   MCHC 32.2 30.0 - 36.0 g/dL   RDW 84.8 88.4 - 84.4 %   Platelets 237 150 - 400 K/uL   nRBC 0.0 0.0 - 0.2 %  Basic metabolic panel with GFR   Collection Time: 08/06/24 12:52 PM  Result Value Ref Range   Sodium 143 135 - 145 mmol/L   Potassium 4.1 3.5 - 5.1 mmol/L   Chloride 109 98 - 111 mmol/L   CO2 22 22 - 32 mmol/L   Glucose, Bld 118 (H) 70 - 99 mg/dL   BUN 27 (H) 8 - 23 mg/dL   Creatinine, Ser 8.57 (H) 0.61 - 1.24 mg/dL   Calcium  9.5 8.9 - 10.3 mg/dL   GFR, Estimated 50 (L) >60 mL/min   Anion gap 12 5 - 15     EKG: EKG Interpretation Date/Time:  Saturday August 06 2024 12:44:55 EDT Ventricular Rate:  70 PR Interval:  185 QRS Duration:  72 QT  Interval:  399 QTC Calculation: 409 R Axis:   26  Text Interpretation: Sinus rhythm Baseline wander Confirmed by Bernard Drivers ([id number]) on 08/06/2024 12:50:53 PM  Radiology: No results found.   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 Labs: ordered. Decision-making details documented in ED Course. ECG/medicine tests: ordered and independent interpretation performed. Decision-making details documented in ED Course.  Risk Decision regarding hospitalization.   Iv ns. Continuous pulse ox and cardiac monitoring. Labs ordered/sent.   Differential diagnosis includes afib/rvr, palpitations, etc. Dispo decision including potential need for admission considered - will get labs and reassess.   Reviewed nursing notes and prior charts for additional history. External reports reviewed. Additional history from: spouse.   Cardiac monitor: sinus rhythm, rate 66.  Labs reviewed/interpreted by me - wbc and hgb normal. Chem largely unremarkable.   Po fluids/food provided. Ambulate in hall.  No faintness or dizziness. No symptoms of any sort, pt indicates feels fine and ready for d/c. Remains in nsr.   Pt currently appears stable for ED d/c.   Rec close pcp/cardiology f/u.  Return precautions provided.       Final diagnoses:  None    ED Discharge Orders     None          Bernard Drivers, MD 08/06/24 1413

## 2024-08-06 NOTE — Discharge Instructions (Addendum)
 It was our pleasure to provide your ER care today - we hope that you feel better.  Continue your medications. Drink adequate fluids/stay well hydrated.   For atrial fib - follow up closely with your doctor/cardiologist in the next 1-2 weeks - call office Monday AM to arrange appointment time.   Relating your mild chronic kidney disease - see attached info, stay well hydrated, avoid taking non-steroidal anti-inflammatory meds such as ibuprofen/motrin or naprosyn/aleve, and follow up with your doctor.   Return to ER if worse, new symptoms, fevers, new/severe pain, chest pain, trouble breathing, persistent fast heart beating, fainting, or other concern.

## 2024-08-06 NOTE — ED Notes (Signed)
 Reports feeling better. Tolerating fluids and snacks.

## 2024-08-06 NOTE — ED Notes (Signed)
 Ambulated denies dizziness, sob, palpitations. Feels good and ready to go

## 2024-08-06 NOTE — ED Notes (Signed)
 Logan Wright  dis

## 2024-08-06 NOTE — ED Triage Notes (Signed)
 Arrives POV to the ED with complaints of dizziness and tachycardia. Heart rate was in the 150's at home.

## 2024-08-08 ENCOUNTER — Telehealth: Payer: Self-pay | Admitting: Internal Medicine

## 2024-08-08 NOTE — Telephone Encounter (Signed)
 Pt reports that he was in afib last Friday night. He went to [facility] ER and he spontaneously converted back into NSR.   He feels well, no symptoms, he even exercised today. Made a f/u visit 12/28/23 with Dr Okey who is his provider since Dr Alveta retired.   He just wanted to make his provider aware of this incident.

## 2024-08-08 NOTE — Telephone Encounter (Signed)
 Patient c/o Palpitations:  STAT if patient reporting lightheadedness, shortness of breath, or chest pain  How long have you had palpitations/irregular HR/ Afib? Are you having the symptoms now? Afib on Friday went to [facility]  Are you currently experiencing lightheadedness, SOB or CP? no  Do you have a history of afib (atrial fibrillation) or irregular heart rhythm? yes  Have you checked your BP or HR? (document readings if available):   Are you experiencing any other symptoms? None Just wanted to make us  aware

## 2024-08-12 DIAGNOSIS — N183 Chronic kidney disease, stage 3 unspecified: Secondary | ICD-10-CM | POA: Diagnosis not present

## 2024-08-12 DIAGNOSIS — I4891 Unspecified atrial fibrillation: Secondary | ICD-10-CM | POA: Diagnosis not present

## 2024-08-12 DIAGNOSIS — I10 Essential (primary) hypertension: Secondary | ICD-10-CM | POA: Diagnosis not present

## 2024-08-12 DIAGNOSIS — G459 Transient cerebral ischemic attack, unspecified: Secondary | ICD-10-CM | POA: Diagnosis not present

## 2024-08-23 DIAGNOSIS — H52202 Unspecified astigmatism, left eye: Secondary | ICD-10-CM | POA: Diagnosis not present

## 2024-08-23 DIAGNOSIS — H25042 Posterior subcapsular polar age-related cataract, left eye: Secondary | ICD-10-CM | POA: Diagnosis not present

## 2024-08-23 DIAGNOSIS — H2512 Age-related nuclear cataract, left eye: Secondary | ICD-10-CM | POA: Diagnosis not present

## 2024-08-23 DIAGNOSIS — H25812 Combined forms of age-related cataract, left eye: Secondary | ICD-10-CM | POA: Diagnosis not present

## 2024-08-30 ENCOUNTER — Other Ambulatory Visit: Payer: Self-pay | Admitting: Hematology and Oncology

## 2024-08-30 DIAGNOSIS — G459 Transient cerebral ischemic attack, unspecified: Secondary | ICD-10-CM | POA: Diagnosis not present

## 2024-08-30 DIAGNOSIS — I10 Essential (primary) hypertension: Secondary | ICD-10-CM | POA: Diagnosis not present

## 2024-08-30 DIAGNOSIS — N183 Chronic kidney disease, stage 3 unspecified: Secondary | ICD-10-CM | POA: Diagnosis not present

## 2024-08-30 DIAGNOSIS — Z1231 Encounter for screening mammogram for malignant neoplasm of breast: Secondary | ICD-10-CM

## 2024-08-30 DIAGNOSIS — I4891 Unspecified atrial fibrillation: Secondary | ICD-10-CM | POA: Diagnosis not present

## 2024-09-17 ENCOUNTER — Other Ambulatory Visit: Payer: Self-pay

## 2024-09-17 ENCOUNTER — Encounter (HOSPITAL_COMMUNITY): Payer: Self-pay

## 2024-09-17 ENCOUNTER — Emergency Department (HOSPITAL_COMMUNITY)

## 2024-09-17 ENCOUNTER — Inpatient Hospital Stay (HOSPITAL_COMMUNITY)
Admission: EM | Admit: 2024-09-17 | Discharge: 2024-09-20 | DRG: 322 | Disposition: A | Discharge status: Home / Self Care | Attending: Internal Medicine | Admitting: Internal Medicine

## 2024-09-17 DIAGNOSIS — E7800 Pure hypercholesterolemia, unspecified: Secondary | ICD-10-CM

## 2024-09-17 DIAGNOSIS — I959 Hypotension, unspecified: Secondary | ICD-10-CM | POA: Diagnosis not present

## 2024-09-17 DIAGNOSIS — I10 Essential (primary) hypertension: Secondary | ICD-10-CM | POA: Diagnosis not present

## 2024-09-17 DIAGNOSIS — R000 Tachycardia, unspecified: Secondary | ICD-10-CM | POA: Diagnosis not present

## 2024-09-17 DIAGNOSIS — R404 Transient alteration of awareness: Secondary | ICD-10-CM | POA: Diagnosis present

## 2024-09-17 DIAGNOSIS — R55 Syncope and collapse: Principal | ICD-10-CM

## 2024-09-17 DIAGNOSIS — R61 Generalized hyperhidrosis: Secondary | ICD-10-CM | POA: Diagnosis not present

## 2024-09-17 DIAGNOSIS — I4892 Unspecified atrial flutter: Secondary | ICD-10-CM | POA: Diagnosis not present

## 2024-09-17 DIAGNOSIS — N183 Chronic kidney disease, stage 3 unspecified: Secondary | ICD-10-CM | POA: Diagnosis present

## 2024-09-17 DIAGNOSIS — I480 Paroxysmal atrial fibrillation: Secondary | ICD-10-CM | POA: Diagnosis not present

## 2024-09-17 DIAGNOSIS — Z955 Presence of coronary angioplasty implant and graft: Secondary | ICD-10-CM

## 2024-09-17 DIAGNOSIS — Z87898 Personal history of other specified conditions: Secondary | ICD-10-CM | POA: Diagnosis not present

## 2024-09-17 DIAGNOSIS — E875 Hyperkalemia: Secondary | ICD-10-CM | POA: Diagnosis not present

## 2024-09-17 DIAGNOSIS — Z96611 Presence of right artificial shoulder joint: Secondary | ICD-10-CM | POA: Diagnosis not present

## 2024-09-17 DIAGNOSIS — I214 Non-ST elevation (NSTEMI) myocardial infarction: Secondary | ICD-10-CM

## 2024-09-17 DIAGNOSIS — I4891 Unspecified atrial fibrillation: Secondary | ICD-10-CM

## 2024-09-17 DIAGNOSIS — R0602 Shortness of breath: Secondary | ICD-10-CM | POA: Diagnosis not present

## 2024-09-17 DIAGNOSIS — E785 Hyperlipidemia, unspecified: Secondary | ICD-10-CM | POA: Diagnosis present

## 2024-09-17 DIAGNOSIS — I700 Atherosclerosis of aorta: Secondary | ICD-10-CM | POA: Diagnosis not present

## 2024-09-17 DIAGNOSIS — N1831 Chronic kidney disease, stage 3a: Secondary | ICD-10-CM | POA: Diagnosis not present

## 2024-09-17 DIAGNOSIS — F32A Depression, unspecified: Secondary | ICD-10-CM | POA: Diagnosis not present

## 2024-09-17 LAB — BASIC METABOLIC PANEL WITH GFR
Anion gap: 15 (ref 5–15)
BUN: 24 mg/dL — ABNORMAL HIGH (ref 8–23)
CO2: 18 mmol/L — ABNORMAL LOW (ref 22–32)
Calcium: 8.9 mg/dL (ref 8.9–10.3)
Chloride: 108 mmol/L (ref 98–111)
Creatinine, Ser: 1.47 mg/dL — ABNORMAL HIGH (ref 0.61–1.24)
GFR, Estimated: 48 mL/min — ABNORMAL LOW (ref >60)
Glucose, Bld: 116 mg/dL — ABNORMAL HIGH (ref 70–99)
Potassium: 5.2 mmol/L — ABNORMAL HIGH (ref 3.5–5.1)
Sodium: 141 mmol/L (ref 135–145)

## 2024-09-17 LAB — CBC
HCT: 45.9 % (ref 39.0–52.0)
Hemoglobin: 14.5 g/dL (ref 13.0–17.0)
MCH: 27.3 pg (ref 26.0–34.0)
MCHC: 31.6 g/dL (ref 30.0–36.0)
MCV: 86.4 fL (ref 80.0–100.0)
Platelets: 205 K/uL (ref 150–400)
RBC: 5.31 MIL/uL (ref 4.22–5.81)
RDW: 15.3 % (ref 11.5–15.5)
WBC: 9 K/uL (ref 4.0–10.5)
nRBC: 0 % (ref 0.0–0.2)

## 2024-09-17 LAB — TROPONIN I (HIGH SENSITIVITY)
Troponin I (High Sensitivity): 12 ng/L (ref <18)
Troponin I (High Sensitivity): 1595 ng/L (ref <18)
Troponin I (High Sensitivity): 2003 ng/L (ref <18)

## 2024-09-17 LAB — MAGNESIUM: Magnesium: 2.2 mg/dL (ref 1.7–2.4)

## 2024-09-17 LAB — TSH
TSH: 4.024 u[IU]/mL (ref 0.350–4.500)
TSH: 1.844 u[IU]/mL (ref 0.350–4.500)

## 2024-09-17 LAB — D-DIMER, QUANTITATIVE: D-Dimer, Quant: 0.54 ug{FEU}/mL — ABNORMAL HIGH (ref 0.00–0.50)

## 2024-09-17 MED ORDER — SODIUM CHLORIDE 0.9% FLUSH
3.0000 mL | Freq: Two times a day (BID) | INTRAVENOUS | Status: DC
  Administered 2024-09-17 – 2024-09-20 (×6): 3 mL via INTRAVENOUS

## 2024-09-17 MED ORDER — DILTIAZEM HCL ER COATED BEADS 120 MG PO CP24
120.0000 mg | ORAL_CAPSULE | Freq: Every day | ORAL | Status: DC
  Administered 2024-09-18 – 2024-09-20 (×3): 120 mg via ORAL
  Filled 2024-09-17 (×3): qty 1

## 2024-09-17 MED ORDER — ACETAMINOPHEN 650 MG RE SUPP: 650.0000 mg | Freq: Four times a day (QID) | RECTAL | Status: DC | PRN

## 2024-09-17 MED ORDER — LATANOPROST 0.005 % OP SOLN
1.0000 [drp] | Freq: Every day | OPHTHALMIC | Status: DC
  Administered 2024-09-17 – 2024-09-19 (×3): 1 [drp] via OPHTHALMIC
  Filled 2024-09-17 (×2): qty 2.5

## 2024-09-17 MED ORDER — ALLOPURINOL 100 MG PO TABS
200.0000 mg | ORAL_TABLET | Freq: Every day | ORAL | Status: DC
  Administered 2024-09-18 – 2024-09-20 (×3): 200 mg via ORAL
  Filled 2024-09-17 (×3): qty 2

## 2024-09-17 MED ORDER — ATORVASTATIN CALCIUM 80 MG PO TABS
80.0000 mg | ORAL_TABLET | Freq: Every day | ORAL | Status: DC
  Administered 2024-09-17 – 2024-09-19 (×3): 80 mg via ORAL
  Filled 2024-09-17 (×3): qty 1

## 2024-09-17 MED ORDER — APIXABAN 5 MG PO TABS: 5.0000 mg | ORAL_TABLET | Freq: Two times a day (BID) | ORAL | Status: DC

## 2024-09-17 MED ORDER — ACETAMINOPHEN 325 MG PO TABS: 650.0000 mg | ORAL_TABLET | Freq: Four times a day (QID) | ORAL | Status: DC | PRN

## 2024-09-17 MED ORDER — ONDANSETRON HCL 4 MG PO TABS: 4.0000 mg | ORAL_TABLET | Freq: Four times a day (QID) | ORAL | Status: DC | PRN

## 2024-09-17 MED ORDER — ALBUTEROL SULFATE (2.5 MG/3ML) 0.083% IN NEBU: 2.5000 mg | INHALATION_SOLUTION | Freq: Four times a day (QID) | RESPIRATORY_TRACT | Status: DC | PRN

## 2024-09-17 MED ORDER — EMPAGLIFLOZIN 10 MG PO TABS
10.0000 mg | ORAL_TABLET | Freq: Every day | ORAL | Status: DC
  Administered 2024-09-18: 10 mg via ORAL
  Filled 2024-09-17: qty 1

## 2024-09-17 MED ORDER — HEPARIN (PORCINE) 25000 UT/250ML-% IV SOLN
950.0000 [IU]/h | INTRAVENOUS | Status: DC
  Administered 2024-09-17: 1250 [IU]/h via INTRAVENOUS
  Administered 2024-09-18: 950 [IU]/h via INTRAVENOUS
  Filled 2024-09-17 (×2): qty 250

## 2024-09-17 MED ORDER — SODIUM CHLORIDE 0.9 % IV BOLUS
1000.0000 mL | Freq: Once | INTRAVENOUS | Status: AC
  Administered 2024-09-17: 1000 mL via INTRAVENOUS

## 2024-09-17 MED ORDER — ASPIRIN 81 MG PO TBEC
81.0000 mg | DELAYED_RELEASE_TABLET | Freq: Every day | ORAL | Status: DC
  Administered 2024-09-17 – 2024-09-19 (×3): 81 mg via ORAL
  Filled 2024-09-17 (×4): qty 1

## 2024-09-17 MED ORDER — ONDANSETRON HCL 4 MG/2ML IJ SOLN: 4.0000 mg | Freq: Four times a day (QID) | INTRAMUSCULAR | Status: DC | PRN

## 2024-09-17 NOTE — ED Provider Notes (Signed)
 [place] EMERGENCY DEPARTMENT AT [facility] Provider Note   CSN: [id number] Arrival date & time: 09/17/24  1039     Patient presents with: SYNCOPE/ a. fib   Logan Wright is a 78 y.o. male.   HPI   Patient presents because of syncope. Was sitting in car when had syncopal episode. He does not remember this.  Patient states that he has maybe had some slight midsternal chest discomfort.  No pleuritic chest pain or hemoptysis.  Patient states been compliant with all his Eliquis .  Has not missed any doses at all anytime soon.  Patient states he is usually in sinus rhythm.  Patient states he is usually not in A-fib.  Compliant with all of his medications.  EMS found the patient to be hypotensive found in the car unresponsive.  Unclear how long patient was there for.  He evidently had a heart rate in the 30s to 40s.  They started compressions but frequent shift quickly woke back up.   Patient does state that he did not eat or drink this morning.  Went to the gym and worked out.  Usually drinks about a cup of water  before going to the gym but do not do this  Previous medical history reviewed : Patient last seen in the ED on August 06, 2024.  History of A-fib.  Presented because of dizziness and tachycardia.  Unremarkable workup.   Prior to Admission medications   Medication Sig Start Date End Date Taking? Authorizing Provider  allopurinol  (ZYLOPRIM ) 100 MG tablet Take 200 mg by mouth daily.    [provider]  apixaban  (ELIQUIS ) 5 MG TABS tablet Take 1 tablet by mouth twice daily 03/22/24   [name], Aleene PARAS, MD  aspirin  EC 81 MG tablet Take 81 mg by mouth daily. Swallow whole.    [provider]  atorvastatin  (LIPITOR ) 80 MG tablet Take 80 mg by mouth at bedtime.    [provider]  diltiazem  (CARDIZEM  CD) 120 MG 24 hr capsule Take 1 capsule (120 mg total) by mouth daily. 06/29/24   Dick, Ernest H Jr., NP  diphenhydrAMINE -zinc acetate (BENADRYL ) cream  Apply 1 Application topically 3 (three) times daily as needed for itching.    [provider]  empagliflozin  (JARDIANCE ) 10 MG TABS tablet Take 10 mg by mouth daily.    [provider]  GLUCOSAMINE-CHONDROITIN PO Take 2 tablets by mouth daily.    [provider]  latanoprost  (XALATAN ) 0.005 % ophthalmic solution Place 1 drop into both eyes at bedtime. 12/22/23   [provider]  Multiple Vitamin (MULTIVITAMIN) tablet Take 1 tablet by mouth daily.     [provider]  Omega-3 Fatty Acids (FISH OIL ULTRA) 1400 MG CAPS Take 1,400-2,800 mg by mouth See admin instructions. Take 1400 mg in the morning and 2800 mg in the evening    [provider]  Potassium 99 MG TABS Take 1 tablet by mouth daily.    [provider]  tamoxifen  (NOLVADEX ) 20 MG tablet Take 1 tablet by mouth once daily 12/28/23   Gudena, Vinay, MD    Allergies: Patient has no known allergies.    Review of Systems  Constitutional:  Negative for chills and fever.  HENT:  Negative for ear pain and sore throat.   Eyes:  Negative for pain and visual disturbance.  Respiratory:  Negative for cough and shortness of breath.   Cardiovascular:  Negative for chest pain and palpitations.  Gastrointestinal:  Negative for  abdominal pain and vomiting.  Genitourinary:  Negative for dysuria and hematuria.  Musculoskeletal:  Negative for arthralgias and back pain.  Skin:  Negative for color change and rash.  Neurological:  Negative for seizures and syncope.  All other systems reviewed and are negative.   Updated Vital Signs BP 114/74   Pulse 88   Temp (!) 97.4 F (36.3 C) (Oral)   Resp 13   Ht 5' 7 (1.702 m)   Wt 80.7 kg   SpO2 99%   BMI 27.88 kg/m   Physical Exam Vitals and nursing note reviewed.  Constitutional:      General: He is not in acute distress.    Appearance: He is well-developed.  HENT:     Head: Normocephalic and atraumatic.  Eyes:     Conjunctiva/sclera:  Conjunctivae normal.  Cardiovascular:     Rate and Rhythm: Normal rate and regular rhythm.     Heart sounds: No murmur heard. Pulmonary:     Effort: Pulmonary effort is normal. No respiratory distress.     Breath sounds: Normal breath sounds.  Abdominal:     Palpations: Abdomen is soft.     Tenderness: There is no abdominal tenderness.  Musculoskeletal:        General: No swelling.     Cervical back: Neck supple.  Skin:    General: Skin is warm and dry.     Capillary Refill: Capillary refill takes less than 2 seconds.  Neurological:     Mental Status: He is alert.  Psychiatric:        Mood and Affect: Mood normal.     (all labs ordered are listed, but only abnormal results are displayed) Labs Reviewed  BASIC METABOLIC PANEL WITH GFR - Abnormal; Notable for the following components:      Result Value   Potassium 5.2 (*)    CO2 18 (*)    Glucose, Bld 116 (*)    BUN 24 (*)    Creatinine, Ser 1.47 (*)    GFR, Estimated 48 (*)    All other components within normal limits  MAGNESIUM   CBC  TSH  CARBOXYHEMOGLOBIN - COOX  TROPONIN I (HIGH SENSITIVITY)  TROPONIN I (HIGH SENSITIVITY)    EKG: EKG Interpretation Date/Time:  Saturday September 17 2024 11:29:18 EST Ventricular Rate:  85 PR Interval:    QRS Duration:  84 QT Interval:  398 QTC Calculation: 474 R Axis:   18  Text Interpretation: Atrial fibrillation Ventricular premature complex Confirmed by Simon Rea [phone]) on 09/17/2024 11:59:58 AM  Radiology: DG Chest Portable 1 View Result Date: 09/17/2024 EXAM: 1 VIEW(S) XRAY OF THE CHEST 09/17/2024 11:18:34 AM COMPARISON: 02/26/2024 CLINICAL HISTORY: SOB FINDINGS: LUNGS AND PLEURA: No focal pulmonary opacity. No pleural effusion. No pneumothorax. HEART AND MEDIASTINUM: Aortic calcification. No acute abnormality of the cardiac and mediastinal silhouettes. 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  IMPRESSION: 1. No acute cardiopulmonary process to explain shortness of breath. Electronically signed by: Waddell Calk MD 09/17/2024 11:21 AM EST RP Workstation: [id number]     Procedures   Medications Ordered in the ED  sodium chloride  flush (NS) 0.9 % injection 3 mL (has no administration in time range)  acetaminophen  (TYLENOL ) tablet 650 mg (has no administration in time range)    Or  acetaminophen  (TYLENOL ) suppository 650 mg (has no administration in time range)  ondansetron  (ZOFRAN ) tablet 4 mg (has no administration in time range)    Or  ondansetron  (ZOFRAN ) injection 4 mg (has no administration in time range)  albuterol  (PROVENTIL ) (2.5 MG/3ML) 0.083% nebulizer solution 2.5 mg (has no administration in time range)  sodium chloride  0.9 % bolus 1,000 mL (0 mLs Intravenous Stopped 09/17/24 1112)    Clinical Course as of 09/17/24 1342  Sat Sep 17, 2024  1215 Low dose dilt. If high clinical concern.  [TL]    Clinical Course User Index [TL] Simon Lavonia SAILOR, MD                                 Medical Decision Making Amount and/or Complexity of Data Reviewed Labs: ordered. Radiology: ordered.     HPI:  Patient presents because of syncope. Was sitting in car when had syncopal episode. He does not remember this.  Patient states that he has maybe had some slight midsternal chest discomfort.  No pleuritic chest pain or hemoptysis.  Patient states been compliant with all his Eliquis .  Has not missed any doses at all anytime soon.  Patient states he is usually in sinus rhythm.  Patient states he is usually not in A-fib.  Compliant with all of his medications.  EMS found the patient to be hypotensive found in the car unresponsive.  Unclear how long patient was there for.  He evidently had a heart rate in the 30s to 40s.  They started compressions but frequent shift quickly woke back up.     Patient does state that he did not eat or drink this morning.  Went to the gym and worked out.   Usually drinks about a cup of water  before going to the gym but do not do this  Previous medical history reviewed : Patient last seen in the ED on August 06, 2024.  History of A-fib.  Presented because of dizziness and tachycardia.  Unremarkable workup.  MDM:   Upon exam, patient slightly hypotensive.  Maps around 65.  Heart rate A-fib around rate of 100.  No indication for emergent cardioversion at this time.  Patient ANO x 3 GCS 15.  NIH is 0.  Cranials 2 through 12 intact.  No concerns for CVA.  Will obtain laboratory workup assessment Of AKI.  Patient will be given fluids and reassessment.  Reevaluation:   Upon reexamination, patient hemodynamically stable.  Remains A&O x 3 with GCS 15.  Mains in A-fib.  Around 100 rate.  Rate controlled.  I did speak to cardiology about whether or not to cardiovert this patient.  1 thought process is if we cardiovert this patient, he may go down to high to his intrinsic sinus rate and 1 concern is that maybe had sinus bradycardia today that led to the syncope.  Therefore, we will hold on cardioversion at this time.  Another thought process is whether or not this could have been orthostatic given that patient went on a bike and subsequently worked out really hard and then maybe he passed out because of this because of lack of p.o. intake.  Unclear whether or not he was get out of his car at that time.  He was borderline hypotensive when he came to the ED did receive fluids.    Cardiac enzyme 12.  Will repeat.  No chest pain at this time.  No recent chest pain  Patient will be placed in observation on cardiac telemetry.  Interventions: 1 L ns   EKG Interpreted by Me: a fib  Cardiac Tele Interpreted by Me: a fib    I have independently interpreted the CXR images and agree with the radiologist finding   Social Determinant of Health: denies alcohol use    Disposition and Follow Up: admit       Final diagnoses:  Syncope and collapse   Atrial fib/flutter, transient [facility])    ED Discharge Orders     None          Simon Lavonia SAILOR, MD 09/17/24 1342

## 2024-09-17 NOTE — H&P (Signed)
 History and Physical    Patient: Logan Wright FMW:[id number] DOB: 05-May-1946 DOA: 09/17/2024 DOS: the patient was seen and examined on 09/17/2024 PCP: Dayna Motto, DO  Patient coming from: [facility] via EMS  Chief Complaint:  Chief Complaint  Patient presents with   SYNCOPE/ a. fib   HPI: Logan Wright is a 78 y.o. male with medical history significant of paroxysmal A-fib, hypertension, HLD, CAD, ascending aorta, right breast cancer s/p masectomy presents after having episode of unresponsiveness.  Patient recalls getting up this morning and going to the gym and riding the stationary bike for approximately 45 minutes without eating breakfast or drinking any fluids he did take his morning medications which included Cardizem .  He recalls going to the gas station to get a cup of coffee and thereafter the next thing he recalls is waking up in the hospital.  There is reported that patient was found unresponsive sitting in his car which it was unclear how long he had been there.  EMS reported patient's heart rates dropped into the 30s at 1 point for which they attempted to start compressions but stopped quickly as the patient screamed.  Patient does report having some discomfort with taking a deep breath in, but thinks it secondary to where they were doing compressions.  He reports compliance with Eliquis .  To his knowledge he has not been experiencing any palpitations as he had experienced previously when he had gone into atrial fibrillation.  In the emergency department patient was noted to be afebrile with heart rates 50-1 20 in atrial fibrillation, blood pressures 92/65 to 111/64, and O2 saturation maintained on room air.  Labs noted CBC within normal limits, high-sensitivity troponin 12, potassium 5.2, BUN 24, and creatinine 1.47.  Chest x-ray noted no acute abnormality.  Patient was given 1 L bolus of normal saline IV fluids.  TRH called to admit for observation.   Review of Systems: As  mentioned in the history of present illness. All other systems reviewed and are negative. Past Medical History:  Diagnosis Date   Ascending aorta dilatation    Ascending aorta dilation 03/30/2023   Echocardiogram 07/2020: 42 mm   Breast cancer (HCC) 2021   right breast   Breast cancer, right (HCC)    CKD (chronic kidney disease), stage III (HCC)    Colon adenoma 2015   Coronary artery calcification seen on CT scan    Diverticulosis    DENIES    DJD (degenerative joint disease)    Dysrhythmia    A-fib   ED (erectile dysfunction)    Elevated blood pressure 2007-2008   Esophageal reflux    GERD (gastroesophageal reflux disease)    Gout    Gynecomastia    LEFT   Hearing loss    HTN (hypertension)    Hypercholesteremia    Non-cardiac chest pain    OA (osteoarthritis)    LEFT SHOULDER   Onychomycosis of foot with other complication    PAF (paroxysmal atrial fibrillation) (HCC)    Pre-diabetes    Seborrhea    Severe frontal headaches    RESOLVED    Stroke (HCC)    Vertigo    RESOLVED    Wears glasses    Wears hearing aid    Past Surgical History:  Procedure Laterality Date   COLONSCOPY      HIP ARTHROPLASTY Bilateral    AT     IR CV LINE INJECTION  05/14/2020   IR IMAGING GUIDED PORT INSERTION  05/17/2020   IR REMOVAL TUN ACCESS W/ PORT W/O FL MOD SED  05/17/2020   KNEE ARTHROSCOPY     UNSURE WHICH KNEE    MASTECTOMY Right 03/16/2020   MASTECTOMY W/ SENTINEL NODE BIOPSY Right 03/16/2020   Procedure: RIGHT BREAST MASTECTOMY WITH SENTINEL LYMPH NODE BIOPSY;  Surgeon: Vernetta Berg, MD;  Location: [facility];  Service: General;  Laterality: Right;   PORTACATH PLACEMENT Left 05/08/2020   Procedure: INSERTION PORT-A-CATH WITH ULTRASOUND;  Surgeon: Vernetta Berg, MD;  Location: [facility] OR;  Service: General;  Laterality: Left;  LMA   REVERSE SHOULDER ARTHROPLASTY Right 03/10/2024   Procedure: ARTHROPLASTY, SHOULDER, TOTAL, REVERSE;  Surgeon:  Dozier Soulier, MD;  Location: [facility];  Service: Orthopedics;  Laterality: Right;   TOTAL KNEE ARTHROPLASTY Left 11/23/2018   Procedure: TOTAL KNEE ARTHROPLASTY;  Surgeon: Ernie Cough, MD;  Location: [facility];  Service: Orthopedics;  Laterality: Left;  70 mins   TRANSCAROTID ARTERY REVASCULARIZATION  Right 05/14/2023   Procedure: Transcarotid Artery Revascularization;  Surgeon: Magda Debby SAILOR, MD;  Location: [facility] OR;  Service: Vascular;  Laterality: Right;   ULTRASOUND GUIDANCE FOR VASCULAR ACCESS Left 05/14/2023   Procedure: ULTRASOUND GUIDANCE FOR VASCULAR ACCESS;  Surgeon: Magda Debby SAILOR, MD;  Location: [facility] OR;  Service: Vascular;  Laterality: Left;   Social History:  reports that he has never smoked. He has never used smokeless tobacco. He reports that he does not drink alcohol and does not use drugs.  No Known Allergies  Family History  Problem Relation Age of Onset   Heart attack Mother    Stroke Mother    Heart failure Father        63   Hypertension Neg Hx    Breast cancer Neg Hx     Prior to Admission medications   Medication Sig Start Date End Date Taking? Authorizing Provider  allopurinol  (ZYLOPRIM ) 100 MG tablet Take 200 mg by mouth daily.    [provider]  apixaban  (ELIQUIS ) 5 MG TABS tablet Take 1 tablet by mouth twice daily 03/22/24   [name], Aleene PARAS, MD  aspirin  EC 81 MG tablet Take 81 mg by mouth daily. Swallow whole.    [provider]  atorvastatin  (LIPITOR ) 80 MG tablet Take 80 mg by mouth at bedtime.    [provider]  diltiazem  (CARDIZEM  CD) 120 MG 24 hr capsule Take 1 capsule (120 mg total) by mouth daily. 06/29/24   Dick, Ernest H Jr., NP  diphenhydrAMINE -zinc acetate (BENADRYL ) cream Apply 1 Application topically 3 (three) times daily as needed for itching.    [provider]  empagliflozin  (JARDIANCE ) 10 MG TABS tablet Take 10 mg by mouth daily.    [provider]  GLUCOSAMINE-CHONDROITIN PO Take 2 tablets by  mouth daily.    [provider]  latanoprost  (XALATAN ) 0.005 % ophthalmic solution Place 1 drop into both eyes at bedtime. 12/22/23   [provider]  Multiple Vitamin (MULTIVITAMIN) tablet Take 1 tablet by mouth daily.     [provider]  Omega-3 Fatty Acids (FISH OIL ULTRA) 1400 MG CAPS Take 1,400-2,800 mg by mouth See admin instructions. Take 1400 mg in the morning and 2800 mg in the evening    [provider]  Potassium 99 MG TABS Take 1 tablet by mouth daily.    [provider]  tamoxifen  (NOLVADEX ) 20 MG tablet Take 1 tablet by mouth once daily 12/28/23   Gudena, Vinay, MD    Physical Exam: Vitals:  09/17/24 1136 09/17/24 1145 09/17/24 1200 09/17/24 1315  BP: 102/77 106/73 110/81 108/67  Pulse: 100 99 82 78  Resp: 17 16 16 19   Temp:      TempSrc:      SpO2: 98% 97% 97% 97%  Weight:      Height:        Constitutional: Elderly male NAD, calm, comfortable Eyes: PERRL, lids and conjunctivae normal ENMT: Mucous membranes are moist. Posterior pharynx clear of any exudate or lesions.Normal dentition.  Neck: normal, supple, no masses, no thyromegaly Respiratory: clear to auscultation bilaterally, no wheezing, no crackles. Normal respiratory effort. No accessory muscle use.  Cardiovascular: Regular rate and rhythm, no murmurs / rubs / gallops. No extremity edema. 2+ pedal pulses. No carotid bruits.  Abdomen: no tenderness, no masses palpated. No hepatosplenomegaly. Bowel sounds positive.  Musculoskeletal: no clubbing / cyanosis. No joint deformity upper and lower extremities. Good ROM, no contractures. Normal muscle tone.  Skin: no rashes, lesions, ulcers. No induration Neurologic: CN 2-12 grossly intact. Sensation intact, DTR normal. Strength 5/5 in all 4.  Psychiatric: Normal judgment and insight. Alert and oriented x 3. Normal mood.   Data Reviewed:  Atrial fibrillation in 116 bpm.  Reviewed labs, imaging, and pertinent records as  documented.  Assessment and Plan:  Episode of unresponsiveness Patient presents after having episode of unresponsiveness while at the gas station.  Patient was found with heart rates down into the 30s after reportedly working out before eating or drinking anything this morning.  Initial high-sensitivity troponin negative.  Compressions started and stopped by EMS shortly after patient woke up screaming.  Lower suspicion for pulmonary embolism as patient reports compliance with Eliquis . - Admit to a telemetry bed - Check orthostatic vital sign - Check D-dimer and repeat [phone]) - Check echocardiogram - Follow-up telemetry overnight - May warrant Holter monitor at discharge - Cardiology consulted  Paroxysmal atrial fibrillation Patient was found in atrial fibrillation with heart rates in the 110s.  TSH noted to be within normal limits.  Reported taking his diltiazem  this a.m.  Reports previous issues with hypotension when taking Cardizem  more frequently. - Continue Cardizem  - Goal potassium at least 4 and magnesium  at least 2 - Orders placed for heparin  drip per pharmacy  Hyperkalemia Acute.  Initial potassium noted mildly elevated at 5.2.  Patient was given 1 L normal saline IV fluids. - Recheck potassium levels in AM  Essential hypertension Blood pressures currently maintained. - Continue Cardizem   Chronic kidney disease stage IIIa Creatinine noted to be 1.47 with BUN 24. - Recheck kidney function in a.m.  History of prediabetes On admission glucose noted to be 116.  Last available hemoglobin A1c noted to be 5.5 when checked back in 04/2023. - Continue Jardiance   Hyperlipidemia - Continue statin   DVT prophylaxis: Heparin  Advance Care Planning:   Code Status: Full Code    Consults: Cardiology  Family Communication: Wife updated at bedside  Severity of Illness: The appropriate patient status for this patient is OBSERVATION. Observation status is judged to be  reasonable and necessary in order to provide the required intensity of service to ensure the patient's safety. The patient's presenting symptoms, physical exam findings, and initial radiographic and laboratory data in the context of their medical condition is felt to place them at decreased risk for further clinical deterioration. Furthermore, it is anticipated that the patient will be medically stable for discharge from the hospital within 2 midnights of admission.   Author: Maximino DELENA Sharps, MD  09/17/2024 1:19 PM  For on call review [email or web address].

## 2024-09-17 NOTE — Progress Notes (Signed)
 Date and time results received: 09/17/24 17:53 (use smartphrase .now to insert current time)  Test: troponin I Critical Value: 1595 ng/dL  Name of Provider Notified: Dr. Maximino Sharps  Orders Received? Or Actions Taken?: pending MD action.  Patient has been asymptomatic since arrival to unit at 14:34 with no report of chest pain, SOB, dizziness, diaphoresis, nausea or other problems.  Recent atrial fibrillation with RVR has resolved; self-converted to normal sinus rhythm.  Pending cardiology consultation.  Dr. Sharps made aware as he has not been seen yet.

## 2024-09-17 NOTE — Progress Notes (Signed)
 PHARMACY - ANTICOAGULATION CONSULT NOTE  Pharmacy Consult for heparin   Indication: atrial fibrillation  No Known Allergies  Patient Measurements: Height: 5' 7 (170.2 cm) Weight: 84 kg (185 lb 1.6 oz) IBW/kg (Calculated) : 66.1 HEPARIN  DW (KG): 83  Vital Signs: Temp: 98.4 F (36.9 C) (12/06 1636) Temp Source: Oral (12/06 1636) BP: 108/83 (12/06 1636) Pulse Rate: 59 (12/06 1636)  Labs: Recent Labs    09/17/24 1055 09/17/24 1634  HGB 14.5  --   HCT 45.9  --   PLT 205  --   CREATININE 1.47*  --   TROPONINIHS 12 1,595*    Estimated Creatinine Clearance: 42.2 mL/min (A) (by C-G formula based on SCr of 1.47 mg/dL (H)).   Medical History: Past Medical History:  Diagnosis Date   Ascending aorta dilatation    Ascending aorta dilation 03/30/2023   Echocardiogram 07/2020: 42 mm   Breast cancer (HCC) 2021   right breast   Breast cancer, right (HCC)    CKD (chronic kidney disease), stage III (HCC)    Colon adenoma 2015   Coronary artery calcification seen on CT scan    Diverticulosis    DENIES    DJD (degenerative joint disease)    Dysrhythmia    A-fib   ED (erectile dysfunction)    Elevated blood pressure 2007-2008   Esophageal reflux    GERD (gastroesophageal reflux disease)    Gout    Gynecomastia    LEFT   Hearing loss    HTN (hypertension)    Hypercholesteremia    Non-cardiac chest pain    OA (osteoarthritis)    LEFT SHOULDER   Onychomycosis of foot with other complication    PAF (paroxysmal atrial fibrillation) (HCC)    Pre-diabetes    Seborrhea    Severe frontal headaches    RESOLVED    Stroke (HCC)    Vertigo    RESOLVED    Wears glasses    Wears hearing aid     Medications:  Medications Prior to Admission  Medication Sig Dispense Refill Last Dose/Taking   allopurinol  (ZYLOPRIM ) 100 MG tablet Take 200 mg by mouth daily.   09/17/2024 at  8:00 AM   apixaban  (ELIQUIS ) 5 MG TABS tablet Take 1 tablet by mouth twice daily 180 tablet 1 09/17/2024 at   8:00 AM   aspirin  EC 81 MG tablet Take 81 mg by mouth daily. Swallow whole.   09/16/2024 at  8:00 PM   atorvastatin  (LIPITOR ) 80 MG tablet Take 80 mg by mouth at bedtime.   09/16/2024 at  8:00 PM   diltiazem  (CARDIZEM  CD) 120 MG 24 hr capsule Take 1 capsule (120 mg total) by mouth daily. 90 capsule 1 09/17/2024 at  8:00 AM   empagliflozin  (JARDIANCE ) 10 MG TABS tablet Take 10 mg by mouth daily.   09/17/2024 at  8:00 AM   GLUCOSAMINE-CHONDROITIN PO Take 2 tablets by mouth daily.   09/17/2024 at  8:00 AM   latanoprost  (XALATAN ) 0.005 % ophthalmic solution Place 1 drop into both eyes at bedtime.   09/16/2024 at  8:00 PM   Multiple Vitamin (MULTIVITAMIN) tablet Take 1 tablet by mouth daily.    09/17/2024 at  8:00 AM   Omega-3 Fatty Acids (FISH OIL ULTRA) 1400 MG CAPS Take 1,400-2,800 mg by mouth See admin instructions. Take 1400 mg in the morning and 2800 mg in the evening   09/17/2024 at  8:00 AM   Potassium 99 MG TABS Take 1 tablet by mouth daily.  09/17/2024 at  8:00 AM   tamoxifen  (NOLVADEX ) 20 MG tablet Take 1 tablet by mouth once daily 90 tablet 3 09/17/2024 at  8:00 AM   diphenhydrAMINE -zinc acetate (BENADRYL ) cream Apply 1 Application topically 3 (three) times daily as needed for itching.      Scheduled:   [START ON 09/18/2024] allopurinol   200 mg Oral Daily   aspirin  EC  81 mg Oral QHS   atorvastatin   80 mg Oral QHS   [START ON 09/18/2024] diltiazem   120 mg Oral Daily   [START ON 09/18/2024] empagliflozin   10 mg Oral Daily   latanoprost   1 drop Both Eyes QHS   sodium chloride  flush  3 mL Intravenous Q12H   Infusions:   Assessment: Pt has been on apixaban  for his AF who was admitted for syncope. Plan to bridge with heparin  for now until procedures rule out. Last dose this AM. We will use PTT to monitor until correlates with heparin  level.   CBC wnl  Goal of Therapy:  Heparin  level 0.3-0.7 units/ml aPTT 66-102 seconds Monitor platelets by anticoagulation protocol: Yes   Plan:  Heparin  1250  units/hr - start 8pm Check AM PTT/HL then daily  Sergio Batch, PharmD, BCIDP, AAHIVP, CPP Infectious Disease Pharmacist 09/17/2024 6:23 PM

## 2024-09-17 NOTE — Plan of Care (Signed)
  Problem: Education: Goal: Knowledge of General Education information will improve Description: Including pain rating scale, medication(s)/side effects and non-pharmacologic comfort measures Outcome: Progressing   Problem: Health Behavior/Discharge Planning: Goal: Ability to manage health-related needs will improve Outcome: Progressing   Problem: Clinical Measurements: Goal: Ability to maintain clinical measurements within normal limits will improve Outcome: Progressing Goal: Will remain free from infection Outcome: Progressing Goal: Diagnostic test results will improve Outcome: Progressing Goal: Respiratory complications will improve Outcome: Progressing Goal: Cardiovascular complication will be avoided Outcome: Progressing   Problem: Nutrition: Goal: Adequate nutrition will be maintained Outcome: Progressing   Problem: Coping: Goal: Level of anxiety will decrease Outcome: Progressing   Problem: Elimination: Goal: Will not experience complications related to bowel motility Outcome: Progressing Goal: Will not experience complications related to urinary retention Outcome: Progressing   Problem: Pain Managment: Goal: General experience of comfort will improve and/or be controlled Outcome: Progressing   Problem: Safety: Goal: Ability to remain free from injury will improve Outcome: Progressing   Problem: Skin Integrity: Goal: Risk for impaired skin integrity will decrease Outcome: Progressing   Problem: Education: Goal: Knowledge of disease or condition will improve Outcome: Progressing Goal: Understanding of medication regimen will improve Outcome: Progressing Goal: Individualized Educational Video(s) Outcome: Progressing   Problem: Activity: Goal: Ability to tolerate increased activity will improve Outcome: Progressing   Problem: Cardiac: Goal: Ability to achieve and maintain adequate cardiopulmonary perfusion will improve Outcome: Progressing   Problem:  Health Behavior/Discharge Planning: Goal: Ability to safely manage health-related needs after discharge will improve Outcome: Progressing

## 2024-09-17 NOTE — ED Triage Notes (Addendum)
 Pt BIB [facility] from a gas station. He was found sitting in his car responsive to painful stimuli. Unknown how long the pt was out there for. Hx afib. Pt's HR dropped down to 38 at one point. EMS attempted compressions, pt screamed.  Pt reported biking for on empty stomach this morning.   BP 100/64 after 250ml fluids.  Hr 100-130  Spo 96% RA  Cbg 125

## 2024-09-18 ENCOUNTER — Observation Stay (HOSPITAL_COMMUNITY)

## 2024-09-18 DIAGNOSIS — I2510 Atherosclerotic heart disease of native coronary artery without angina pectoris: Secondary | ICD-10-CM | POA: Diagnosis not present

## 2024-09-18 DIAGNOSIS — R55 Syncope and collapse: Secondary | ICD-10-CM | POA: Diagnosis present

## 2024-09-18 DIAGNOSIS — I10 Essential (primary) hypertension: Secondary | ICD-10-CM | POA: Diagnosis not present

## 2024-09-18 DIAGNOSIS — E785 Hyperlipidemia, unspecified: Secondary | ICD-10-CM

## 2024-09-18 DIAGNOSIS — N179 Acute kidney failure, unspecified: Secondary | ICD-10-CM | POA: Diagnosis not present

## 2024-09-18 DIAGNOSIS — I214 Non-ST elevation (NSTEMI) myocardial infarction: Secondary | ICD-10-CM

## 2024-09-18 DIAGNOSIS — R404 Transient alteration of awareness: Secondary | ICD-10-CM | POA: Diagnosis not present

## 2024-09-18 DIAGNOSIS — N183 Chronic kidney disease, stage 3 unspecified: Secondary | ICD-10-CM | POA: Diagnosis not present

## 2024-09-18 DIAGNOSIS — I480 Paroxysmal atrial fibrillation: Secondary | ICD-10-CM | POA: Diagnosis present

## 2024-09-18 LAB — CBC
HCT: 40.4 % (ref 39.0–52.0)
Hemoglobin: 13.1 g/dL (ref 13.0–17.0)
MCH: 27 pg (ref 26.0–34.0)
MCHC: 32.4 g/dL (ref 30.0–36.0)
MCV: 83.3 fL (ref 80.0–100.0)
Platelets: 211 K/uL (ref 150–400)
RBC: 4.85 MIL/uL (ref 4.22–5.81)
RDW: 15.2 % (ref 11.5–15.5)
WBC: 9.8 K/uL (ref 4.0–10.5)
nRBC: 0 % (ref 0.0–0.2)

## 2024-09-18 LAB — BASIC METABOLIC PANEL WITH GFR
Anion gap: 9 (ref 5–15)
BUN: 21 mg/dL (ref 8–23)
CO2: 23 mmol/L (ref 22–32)
Calcium: 8.5 mg/dL — ABNORMAL LOW (ref 8.9–10.3)
Chloride: 109 mmol/L (ref 98–111)
Creatinine, Ser: 1.43 mg/dL — ABNORMAL HIGH (ref 0.61–1.24)
GFR, Estimated: 50 mL/min — ABNORMAL LOW (ref >60)
Glucose, Bld: 92 mg/dL (ref 70–99)
Potassium: 4 mmol/L (ref 3.5–5.1)
Sodium: 141 mmol/L (ref 135–145)

## 2024-09-18 LAB — URINALYSIS, ROUTINE W REFLEX MICROSCOPIC
Bacteria, UA: NONE SEEN
Bilirubin Urine: NEGATIVE
Glucose, UA: >=500 mg/dL — AB
Hgb urine dipstick: NEGATIVE
Ketones, ur: NEGATIVE mg/dL
Leukocytes,Ua: NEGATIVE
Nitrite: NEGATIVE
Protein, ur: NEGATIVE mg/dL
Specific Gravity, Urine: 1.013 (ref 1.005–1.030)
pH: 5 (ref 5.0–8.0)

## 2024-09-18 LAB — ECHOCARDIOGRAM COMPLETE
AR max vel: 1.6 cm2
AV Peak grad: 9.7 mmHg
Ao pk vel: 1.56 m/s
Area-P 1/2: 3.08 cm2
Calc EF: 63.2 %
Height: 67 in
S' Lateral: 2.9 cm
Single Plane A2C EF: 63.9 %
Single Plane A4C EF: 62.6 %
Weight: 2961.6 [oz_av]

## 2024-09-18 LAB — APTT
aPTT: 104 s — ABNORMAL HIGH (ref 24–36)
aPTT: 106 s — ABNORMAL HIGH (ref 24–36)

## 2024-09-18 LAB — TROPONIN I (HIGH SENSITIVITY): Troponin I (High Sensitivity): 1289 ng/L (ref <18)

## 2024-09-18 LAB — HEPARIN LEVEL (UNFRACTIONATED): Heparin Unfractionated: >1.1 [IU]/mL — ABNORMAL HIGH (ref 0.30–0.70)

## 2024-09-18 MED ORDER — ASPIRIN 81 MG PO CHEW
81.0000 mg | CHEWABLE_TABLET | ORAL | Status: AC
  Administered 2024-09-19: 81 mg via ORAL

## 2024-09-18 MED ORDER — FREE WATER
500.0000 mL | Freq: Once | Status: AC
  Administered 2024-09-19: 500 mL via ORAL

## 2024-09-18 MED ORDER — SODIUM CHLORIDE 0.9 % IV SOLN: INTRAVENOUS | Status: AC

## 2024-09-18 NOTE — Progress Notes (Signed)
 PHARMACY - ANTICOAGULATION CONSULT NOTE  Pharmacy Consult for heparin  Indication: atrial fibrillation  Labs: Recent Labs    09/17/24 1055 09/17/24 1634 09/17/24 2157 09/18/24 0512  HGB 14.5  --   --  13.1  HCT 45.9  --   --  40.4  PLT 205  --   --  211  APTT  --   --   --  104*  HEPARINUNFRC  --   --   --  >1.10*  CREATININE 1.47*  --   --   --   TROPONINIHS 12 1,595* 2,003* 1,289*   Assessment: 78yo male supratherapeutic on heparin  with initial dosing while DOAC on hold; no infusion issues or signs of bleeding per RN.  Goal of Therapy:  aPTT 66-102 seconds   Plan:  Decrease heparin  infusion by 2 units/kg/hr to 1100 units/hr. Check PTT in 8 hours.   Marvetta Dauphin, PharmD, BCPS 09/18/2024 6:17 AM

## 2024-09-18 NOTE — Care Management Obs Status (Signed)
 MEDICARE OBSERVATION STATUS NOTIFICATION   Patient Details  Name: Logan Wright MRN: [id number] Date of Birth: 1946/02/28   Medicare Observation Status Notification Given:  Yes    Shawneen Deetz G., RN 09/18/2024, 9:20 AM

## 2024-09-18 NOTE — Plan of Care (Signed)
  Problem: Education: Goal: Knowledge of General Education information will improve Description: Including pain rating scale, medication(s)/side effects and non-pharmacologic comfort measures Outcome: Progressing   Problem: Health Behavior/Discharge Planning: Goal: Ability to manage health-related needs will improve Outcome: Progressing   Problem: Clinical Measurements: Goal: Will remain free from infection Outcome: Progressing Goal: Cardiovascular complication will be avoided Outcome: Progressing   Problem: Activity: Goal: Risk for activity intolerance will decrease Outcome: Progressing   Problem: Nutrition: Goal: Adequate nutrition will be maintained Outcome: Progressing   Problem: Coping: Goal: Level of anxiety will decrease Outcome: Progressing   Problem: Elimination: Goal: Will not experience complications related to urinary retention Outcome: Progressing   Problem: Pain Managment: Goal: General experience of comfort will improve and/or be controlled Outcome: Progressing   Problem: Safety: Goal: Ability to remain free from injury will improve Outcome: Progressing   Problem: Skin Integrity: Goal: Risk for impaired skin integrity will decrease Outcome: Progressing

## 2024-09-18 NOTE — Plan of Care (Signed)
   Problem: Education: Goal: Knowledge of General Education information will improve Description Including pain rating scale, medication(s)/side effects and non-pharmacologic comfort measures Outcome: Progressing

## 2024-09-18 NOTE — Progress Notes (Signed)
 PROGRESS NOTE    Logan Wright  FMW:[id number] DOB: Dec 03, 1945 DOA: 09/17/2024 PCP: Dayna Motto, DO    Brief Narrative:  Logan Wright is a 78 y.o. male with medical history significant of paroxysmal A-fib, hypertension, HLD, CAD, ascending aorta, right breast cancer s/p masectomy presents after having episode of unresponsiveness.   Patient recalls getting up this morning and going to the gym and riding the stationary bike for approximately 45 minutes without eating breakfast or drinking any fluids he did take his morning medications which included Cardizem .  He recalls going to the gas station to get a cup of coffee and thereafter the next thing he recalls is waking up in the hospital.  There is reported that patient was found unresponsive sitting in his car which it was unclear how long he had been there.  EMS reported patient's heart rates dropped into the 30s at 1 point for which they attempted to start compressions but stopped quickly as the patient screamed.  Patient does report having some discomfort with taking a deep breath in, but thinks it secondary to where they were doing compressions.    Assessment and Plan: Episode of unresponsiveness Patient presents after having episode of unresponsiveness while at the gas station.  Patient was found with heart rates down into the 30s after reportedly working out before eating or drinking anything this morning.  Initial high-sensitivity troponin negative.  Compressions started and stopped by EMS shortly after patient woke up screaming.  Lower suspicion for pulmonary embolism as patient reports compliance with Eliquis . - echo pending -heart cath 12/8-- hydrate prior -heparin  gtt  Paroxysmal atrial fibrillation Patient was found in atrial fibrillation with heart rates in the 110s.  TSH noted to be within normal limits.   -converted to sinus - Continue Cardizem  - Goal potassium at least 4 and magnesium  at least 2 - Orders placed for heparin   drip per pharmacy   Hyperkalemia -resolved   Essential hypertension Blood pressures currently maintained. - Continue Cardizem    Chronic kidney disease stage IIIa Creatinine noted to be 1.47 with BUN 24. - stable -hydrate prior to heart cath in AM   History of prediabetes On admission glucose noted to be 116.  Last available hemoglobin A1c noted to be 5.5 when checked back in 04/2023. - hold Jardiance    Hyperlipidemia - Continue statin       DVT prophylaxis:     Code Status: Full Code   Disposition Plan:  Level of care: Telemetry Status is: Observation     Consultants:  cards   Subjective: No SOB Works out 7 days/week  Objective: Vitals:   09/17/24 2311 09/18/24 0516 09/18/24 0804 09/18/24 0838  BP: 127/71 (!) 141/62 (!) 147/81 130/73  Pulse: (!) 59 (!) 59 69   Resp: 16 19 18    Temp: 98.1 F (36.7 C) 98.3 F (36.8 C) 98.5 F (36.9 C)   TempSrc: Oral Oral Oral   SpO2: 96% 96% 97%   Weight:      Height:        Intake/Output Summary (Last 24 hours) at 09/18/2024 1000 Last data filed at 09/18/2024 0800 Gross per 24 hour  Intake 1007.87 ml  Output 1150 ml  Net -142.13 ml   Filed Weights   09/17/24 1048 09/17/24 1434  Weight: 80.7 kg 84 kg    Examination:   General: Appearance:     Overweight male in no acute distress     Lungs:     respirations unlabored  Heart:  Normal heart rate.    MS:   All extremities are intact.    Neurologic:   Awake, alert       Data Reviewed: I have personally reviewed following labs and imaging studies  CBC: Recent Labs  Lab 09/17/24 1055 09/18/24 0512  WBC 9.0 9.8  HGB 14.5 13.1  HCT 45.9 40.4  MCV 86.4 83.3  PLT 205 211   Basic Metabolic Panel: Recent Labs  Lab 09/17/24 1055 09/18/24 0512  NA 141 141  K 5.2* 4.0  CL 108 109  CO2 18* 23  GLUCOSE 116* 92  BUN 24* 21  CREATININE 1.47* 1.43*  CALCIUM  8.9 8.5*  MG 2.2  --    GFR: Estimated Creatinine Clearance: 43.4 mL/min (A) (by C-G  formula based on SCr of 1.43 mg/dL (H)). Liver Function Tests: No results for input(s): AST, ALT, ALKPHOS, BILITOT, PROT, ALBUMIN in the last 168 hours. No results for input(s): LIPASE, AMYLASE in the last 168 hours. No results for input(s): AMMONIA in the last 168 hours. Coagulation Profile: No results for input(s): INR, PROTIME in the last 168 hours. Cardiac Enzymes: No results for input(s): CKTOTAL, CKMB, CKMBINDEX, TROPONINI in the last 168 hours. BNP (last 3 results) No results for input(s): PROBNP in the last 8760 hours. HbA1C: No results for input(s): HGBA1C in the last 72 hours. CBG: No results for input(s): GLUCAP in the last 168 hours. Lipid Profile: No results for input(s): CHOL, HDL, LDLCALC, TRIG, CHOLHDL, LDLDIRECT in the last 72 hours. Thyroid  Function Tests: Recent Labs    09/17/24 1634  TSH 1.844   Anemia Panel: No results for input(s): VITAMINB12, FOLATE, FERRITIN, TIBC, IRON, RETICCTPCT in the last 72 hours. Sepsis Labs: No results for input(s): PROCALCITON, LATICACIDVEN in the last 168 hours.  No results found for this or any previous visit (from the past 240 hours).       Radiology Studies: DG Chest Portable 1 View Result Date: 09/17/2024 EXAM: 1 VIEW(S) XRAY OF THE CHEST 09/17/2024 11:18:34 AM COMPARISON: 02/26/2024 CLINICAL HISTORY: SOB FINDINGS: LUNGS AND PLEURA: No focal pulmonary opacity. No pleural effusion. No pneumothorax. HEART AND MEDIASTINUM: Aortic calcification. No acute abnormality of the cardiac and mediastinal silhouettes. 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 IMPRESSION: 1. No acute cardiopulmonary process to explain shortness of breath. Electronically signed by: Waddell Calk MD 09/17/2024 11:21 AM EST RP Workstation: [id number]        Scheduled Meds:  allopurinol   200 mg Oral Daily    aspirin  EC  81 mg Oral QHS   atorvastatin   80 mg Oral QHS   diltiazem   120 mg Oral Daily   latanoprost   1 drop Both Eyes QHS   sodium chloride  flush  3 mL Intravenous Q12H   Continuous Infusions:  heparin  1,100 Units/hr (09/18/24 0720)     LOS: 0 days    Time spent: 45 minutes spent on chart review, discussion with nursing staff, consultants, updating family and interview/physical exam; more than 50% of that time was spent in counseling and/or coordination of care.    Harlene RAYMOND Bowl, DO [facility] Available via Epic secure chat 7am-7pm After these hours, please refer to coverage provider listed on [email or web address] 09/18/2024, 10:00 AM

## 2024-09-18 NOTE — Progress Notes (Signed)
 PHARMACY - ANTICOAGULATION CONSULT NOTE  Pharmacy Consult for heparin   Indication: atrial fibrillation  No Known Allergies  Patient Measurements: Height: 5' 7 (170.2 cm) Weight: 84 kg (185 lb 1.6 oz) IBW/kg (Calculated) : 66.1 HEPARIN  DW (KG): 83  Vital Signs: Temp: 98.9 F (37.2 C) (12/07 1342) Temp Source: Oral (12/07 1342) BP: 136/69 (12/07 1342) Pulse Rate: 71 (12/07 1342)  Labs: Recent Labs    09/17/24 1055 09/17/24 1634 09/17/24 2157 09/18/24 0512 09/18/24 1413  HGB 14.5  --   --  13.1  --   HCT 45.9  --   --  40.4  --   PLT 205  --   --  211  --   APTT  --   --   --  104* 106*  HEPARINUNFRC  --   --   --  >1.10*  --   CREATININE 1.47*  --   --  1.43*  --   TROPONINIHS 12 1,595* 2,003* 1,289*  --     Estimated Creatinine Clearance: 43.4 mL/min (A) (by C-G formula based on SCr of 1.43 mg/dL (H)).   Medical History: Past Medical History:  Diagnosis Date   Ascending aorta dilatation    Ascending aorta dilation 03/30/2023   Echocardiogram 07/2020: 42 mm   Breast cancer (HCC) 2021   right breast   Breast cancer, right (HCC)    CKD (chronic kidney disease), stage III (HCC)    Colon adenoma 2015   Coronary artery calcification seen on CT scan    Diverticulosis    DENIES    DJD (degenerative joint disease)    Dysrhythmia    A-fib   ED (erectile dysfunction)    Elevated blood pressure 2007-2008   Esophageal reflux    GERD (gastroesophageal reflux disease)    Gout    Gynecomastia    LEFT   Hearing loss    HTN (hypertension)    Hypercholesteremia    Non-cardiac chest pain    OA (osteoarthritis)    LEFT SHOULDER   Onychomycosis of foot with other complication    PAF (paroxysmal atrial fibrillation) (HCC)    Pre-diabetes    Seborrhea    Severe frontal headaches    RESOLVED    Stroke (HCC)    Vertigo    RESOLVED    Wears glasses    Wears hearing aid     Medications:  Medications Prior to Admission  Medication Sig Dispense Refill Last  Dose/Taking   allopurinol  (ZYLOPRIM ) 100 MG tablet Take 200 mg by mouth daily.   09/17/2024 at  8:00 AM   apixaban  (ELIQUIS ) 5 MG TABS tablet Take 1 tablet by mouth twice daily 180 tablet 1 09/17/2024 at  8:00 AM   aspirin  EC 81 MG tablet Take 81 mg by mouth daily. Swallow whole.   Past Week   atorvastatin  (LIPITOR ) 80 MG tablet Take 80 mg by mouth at bedtime.   Past Week   COMIRNATY syringe Inject 0.3 mLs into the muscle once.   07/26/2024   diltiazem  (CARDIZEM  CD) 120 MG 24 hr capsule Take 1 capsule (120 mg total) by mouth daily. 90 capsule 1 09/17/2024 at  8:00 AM   diphenhydrAMINE -zinc acetate (BENADRYL ) cream Apply 1 Application topically 3 (three) times daily as needed for itching.   Unknown   empagliflozin  (JARDIANCE ) 10 MG TABS tablet Take 10 mg by mouth daily.   09/17/2024 at  8:00 AM   FLUZONE HIGH-DOSE 0.5 ML injection Inject 0.5 mLs into the muscle once.  07/26/2024   GLUCOSAMINE-CHONDROITIN PO Take 2 tablets by mouth daily.   09/17/2024 at  8:00 AM   latanoprost  (XALATAN ) 0.005 % ophthalmic solution Place 1 drop into both eyes at bedtime.   09/16/2024 at  8:00 PM   Multiple Vitamin (MULTIVITAMIN) tablet Take 1 tablet by mouth daily.    09/17/2024 at  8:00 AM   Omega-3 Fatty Acids (FISH OIL ULTRA) 1400 MG CAPS Take 1,400-2,800 mg by mouth See admin instructions. Take 1400 mg in the morning and 2800 mg in the evening   09/17/2024 at  8:00 AM   Potassium 99 MG TABS Take 1 tablet by mouth daily.   09/17/2024 at  8:00 AM   tamoxifen  (NOLVADEX ) 20 MG tablet Take 1 tablet by mouth once daily 90 tablet 3 09/17/2024 at  8:00 AM   Scheduled:   allopurinol   200 mg Oral Daily   aspirin  EC  81 mg Oral QHS   atorvastatin   80 mg Oral QHS   diltiazem   120 mg Oral Daily   latanoprost   1 drop Both Eyes QHS   sodium chloride  flush  3 mL Intravenous Q12H   Infusions:   sodium chloride  40 mL/hr at 09/18/24 1149   heparin  1,100 Units/hr (09/18/24 0720)    Assessment: Pt has been on apixaban  for his AF  who was admitted for syncope. Plan to bridge with heparin  for now until procedures rule out. Last dose this AM. We will use PTT to monitor until correlates with heparin  level.   CBC wnl  PM update: aPTT 106 is supratherapeutic on 1100 units/hr. No issues with the infusion or bleeding reported per RN  Goal of Therapy:  Heparin  level 0.3-0.7 units/ml aPTT 66-102 seconds Monitor platelets by anticoagulation protocol: Yes   Plan:  Decrease Heparin  infusion to 950 units/hr Check AM PTT/HL in 8hrs and daily until correlation Continue to monitor H&H and platelets F/u resuming Eliquis  after cardiac cath 12/8  Rocky Slade, PharmD, BCPS 09/18/2024 4:38 PM  Please check AMION for all [facility] Pharm[facility] phone numbers After 10:00 PM, call Main Pharmacy [phone]

## 2024-09-18 NOTE — Consult Note (Signed)
 Cardiology Consultation   Patient ID: Logan Wright MRN: [id number]; DOB: 13-Mar-1946  Admit date: 09/17/2024 Date of Consult: 09/18/2024  PCP:  Dayna Motto, DO   [facility] Providers Cardiologist:  Previously Dr. Alveta   Patient Profile: Logan Wright is a 78 y.o. male with a hx of paroxysmal atrial fibrillation, HTN, HLD, history of breast cancer, Stage 3 CKD, carotid artery stenosis (s/p right TCAR in 05/2023) and GERD who is being seen 09/18/2024 for the evaluation of NSTEMI at the request of Dr. Juvenal.  History of Present Illness:  [name] was last examined by the [facility] on 07/01/2024 and denied any recent chest pain, palpitations or dyspnea at that time. Had recently presented to the ED on 06/17/2024 and found to be in atrial fibrillation and underwent successful DCCV at that time. He was maintaining normal sinus rhythm and options in regards to rhythm control were reviewed and given this was his first episode in 4 years, watchful waiting was recommended. He was continued on Eliquis  5 mg twice daily and Cardizem  CD 120 mg daily.  Presented back to the ED on 08/06/2024 for evaluation of tachycardia with heart rate in the 180's when checked at home. He was found to be in a narrow complex tachycardia with heart rate in the 160's while in the ED. he spontaneously converted back to normal sinus rhythm and was discharged home.  Presented back to [facility] ED on 09/17/2024 for evaluation of a syncopal episode while sitting in his car with associated chest pain at that time. Per EMS, he was hypotensive upon their arrival and bradycardic with heart rate in the 30s to 40s. Compressions were initially started but the patient woke up.  Reported having gone to workout and was biking for over 45 minutes but had not consumed food or fluids.  EKG upon arrival to the ER showed atrial fibrillation with RVR, HR 116. Initial labs showed WBC 9.0, Hgb 14.5, platelets 205,  Na+ 141, K+ 5.2 and creatinine 1.47. TSH 4.024. D-dimer 0.54. TSH 1.844. Initial Hs troponin 12 with repeat values of 1595, 2003 and 1289. CXR with no acute cardiopulmonary abnormalities.   He received a 1 L fluid bolus while in the ED and given his elevated troponin values, was started on IV Heparin . He has been continued on Cardizem  CD 120 mg daily and ASA 81 mg daily and Atorvastatin  80 mg daily were initiated. He did convert back to normal sinus rhythm around 1630 yesterday afternoon and is maintaining normal sinus rhythm with heart rate in the 50's to 60's. Was not on telemetry at the time of conversion to NSR.   In talking to the patient today, he reports being very active at baseline as he does exercise classes or rides an exercise bike most days of the week. He has not experienced any chest pain or dyspnea on exertion with this. Does report symptomatic episodes of atrial fibrillation as discussed above during which he will become dizzy and checks his heart rate and rhythm on his Kardia monitor. Reports good compliance with his medications and has not missed any recent doses. He is pain-free at this time.    Past Medical History:  Diagnosis Date   Ascending aorta dilatation    Ascending aorta dilation 03/30/2023   Echocardiogram 07/2020: 42 mm   Breast cancer (HCC) 2021   right breast   Breast cancer, right (HCC)    CKD (chronic kidney disease), stage III (HCC)    Colon  adenoma 2015   Coronary artery calcification seen on CT scan    Diverticulosis    DENIES    DJD (degenerative joint disease)    Dysrhythmia    A-fib   ED (erectile dysfunction)    Elevated blood pressure 2007-2008   Esophageal reflux    GERD (gastroesophageal reflux disease)    Gout    Gynecomastia    LEFT   Hearing loss    HTN (hypertension)    Hypercholesteremia    Non-cardiac chest pain    OA (osteoarthritis)    LEFT SHOULDER   Onychomycosis of foot with other complication    PAF (paroxysmal atrial  fibrillation) (HCC)    Pre-diabetes    Seborrhea    Severe frontal headaches    RESOLVED    Stroke (HCC)    Vertigo    RESOLVED    Wears glasses    Wears hearing aid     Past Surgical History:  Procedure Laterality Date   COLONSCOPY      HIP ARTHROPLASTY Bilateral    AT [place]    IR CV LINE INJECTION  05/14/2020   IR IMAGING GUIDED PORT INSERTION  05/17/2020   IR REMOVAL TUN ACCESS W/ PORT W/O FL MOD SED  05/17/2020   KNEE ARTHROSCOPY     UNSURE WHICH KNEE    MASTECTOMY Right 03/16/2020   MASTECTOMY W/ SENTINEL NODE BIOPSY Right 03/16/2020   Procedure: RIGHT BREAST MASTECTOMY WITH SENTINEL LYMPH NODE BIOPSY;  Surgeon: Vernetta Berg, MD;  Location: [facility];  Service: General;  Laterality: Right;   PORTACATH PLACEMENT Left 05/08/2020   Procedure: INSERTION PORT-A-CATH WITH ULTRASOUND;  Surgeon: Vernetta Berg, MD;  Location: [facility] OR;  Service: General;  Laterality: Left;  LMA   REVERSE SHOULDER ARTHROPLASTY Right 03/10/2024   Procedure: ARTHROPLASTY, SHOULDER, TOTAL, REVERSE;  Surgeon: Dozier Soulier, MD;  Location: [facility];  Service: Orthopedics;  Laterality: Right;   TOTAL KNEE ARTHROPLASTY Left 11/23/2018   Procedure: TOTAL KNEE ARTHROPLASTY;  Surgeon: Ernie Cough, MD;  Location: [facility];  Service: Orthopedics;  Laterality: Left;  70 mins   TRANSCAROTID ARTERY REVASCULARIZATION  Right 05/14/2023   Procedure: Transcarotid Artery Revascularization;  Surgeon: Magda Debby SAILOR, MD;  Location: [facility];  Service: Vascular;  Laterality: Right;   ULTRASOUND GUIDANCE FOR VASCULAR ACCESS Left 05/14/2023   Procedure: ULTRASOUND GUIDANCE FOR VASCULAR ACCESS;  Surgeon: Magda Debby SAILOR, MD;  Location: [facility] OR;  Service: Vascular;  Laterality: Left;     Home Medications:  Prior to Admission medications   Medication Sig Start Date End Date Taking? Authorizing Provider  allopurinol  (ZYLOPRIM ) 100 MG tablet Take 200 mg by mouth daily.   Yes [provider]   apixaban  (ELIQUIS ) 5 MG TABS tablet Take 1 tablet by mouth twice daily 03/22/24  Yes [name], Aleene PARAS, MD  aspirin  EC 81 MG tablet Take 81 mg by mouth daily. Swallow whole.   Yes [provider]  atorvastatin  (LIPITOR ) 80 MG tablet Take 80 mg by mouth at bedtime.   Yes [provider]  diltiazem  (CARDIZEM  CD) 120 MG 24 hr capsule Take 1 capsule (120 mg total) by mouth daily. 06/29/24  Yes Wyn Jackee VEAR Mickey., NP  empagliflozin  (JARDIANCE ) 10 MG TABS tablet Take 10 mg by mouth daily.   Yes [provider]  GLUCOSAMINE-CHONDROITIN PO Take 2 tablets by mouth daily.   Yes [provider]  latanoprost  (XALATAN ) 0.005 % ophthalmic solution Place 1 drop into both eyes at bedtime.  12/22/23  Yes [provider]  Multiple Vitamin (MULTIVITAMIN) tablet Take 1 tablet by mouth daily.    Yes [provider]  Omega-3 Fatty Acids (FISH OIL ULTRA) 1400 MG CAPS Take 1,400-2,800 mg by mouth See admin instructions. Take 1400 mg in the morning and 2800 mg in the evening   Yes [provider]  Potassium 99 MG TABS Take 1 tablet by mouth daily.   Yes [provider]  tamoxifen  (NOLVADEX ) 20 MG tablet Take 1 tablet by mouth once daily 12/28/23  Yes Gudena, Vinay, MD  diphenhydrAMINE -zinc acetate (BENADRYL ) cream Apply 1 Application topically 3 (three) times daily as needed for itching.    [provider]    Scheduled Meds:  allopurinol   200 mg Oral Daily   aspirin  EC  81 mg Oral QHS   atorvastatin   80 mg Oral QHS   diltiazem   120 mg Oral Daily   latanoprost   1 drop Both Eyes QHS   sodium chloride  flush  3 mL Intravenous Q12H   Continuous Infusions:  sodium chloride      heparin  1,100 Units/hr (09/18/24 0720)   PRN Meds: acetaminophen  **OR** acetaminophen , albuterol , ondansetron  **OR** ondansetron  (ZOFRAN ) IV  Allergies:   No Known Allergies  Social History:   Social History   Socioeconomic History   Marital status: Married     Spouse name: Not on file   Number of children: Not on file   Years of education: Not on file   Highest education level: Not on file  Occupational History   Not on file  Tobacco Use   Smoking status: Never   Smokeless tobacco: Never   Tobacco comments:    Never smoked 07/01/24  Vaping Use   Vaping status: Never Used  Substance and Sexual Activity   Alcohol use: No    Alcohol/week: 0.0 standard drinks of alcohol   Drug use: No   Sexual activity: Not on file  Other Topics Concern   Not on file  Social History Narrative   Not on file   Social Drivers of Health   Financial Resource Strain: Not on file  Food Insecurity: No Food Insecurity (09/17/2024)   Hunger Vital Sign    Worried About Running Out of Food in the Last Year: Never true    Ran Out of Food in the Last Year: Never true  Transportation Needs: No Transportation Needs (09/17/2024)   PRAPARE - Administrator, Civil Service (Medical): No    Lack of Transportation (Non-Medical): No  Physical Activity: Not on file  Stress: Not on file  Social Connections: Socially Integrated (09/17/2024)   Social Connection and Isolation Panel    Frequency of Communication with Friends and Family: More than three times a week    Frequency of Social Gatherings with Friends and Family: More than three times a week    Attends Religious Services: More than 4 times per year    Active Member of [employer] or Organizations: Yes    Attends Banker Meetings: 1 to 4 times per year    Marital Status: Married  Catering Manager Violence: Not At Risk (09/17/2024)   Humiliation, Afraid, Rape, and Kick questionnaire    Fear of Current or Ex-Partner: No    Emotionally Abused: No    Physically Abused: No    Sexually Abused: No    Family History:    Family History  Problem Relation Age of Onset   Heart attack Mother    Stroke Mother  Heart failure Father        64   Hypertension Neg Hx    Breast cancer Neg Hx      ROS:   Please see the history of present illness.   All other ROS reviewed and negative.     Physical Exam/Data: Vitals:   09/17/24 2311 09/18/24 0516 09/18/24 0804 09/18/24 0838  BP: 127/71 (!) 141/62 (!) 147/81 130/73  Pulse: (!) 59 (!) 59 69   Resp: 16 19 18    Temp: 98.1 F (36.7 C) 98.3 F (36.8 C) 98.5 F (36.9 C)   TempSrc: Oral Oral Oral   SpO2: 96% 96% 97%   Weight:      Height:        Intake/Output Summary (Last 24 hours) at 09/18/2024 1025 Last data filed at 09/18/2024 0800 Gross per 24 hour  Intake 1007.87 ml  Output 1150 ml  Net -142.13 ml      09/17/2024    2:34 PM 09/17/2024   10:48 AM 08/06/2024   12:47 PM  Last 3 Weights  Weight (lbs) 185 lb 1.6 oz 178 lb 184 lb 15.5 oz  Weight (kg) 83.961 kg 80.74 kg 83.9 kg     Body mass index is 28.99 kg/m.  General:  Well nourished, well developed male appearing in no acute distress HEENT: normal Neck: no JVD Vascular: No carotid bruits; Distal pulses 2+ bilaterally Cardiac:  normal S1, S2; RRR; no murmur  Lungs:  clear to auscultation bilaterally, no wheezing, rhonchi or rales  Abd: soft, nontender, no hepatomegaly  Ext: no pitting edema Musculoskeletal:  No deformities, BUE and BLE strength normal and equal Skin: warm and dry  Neuro:  CNs 2-12 intact, no focal abnormalities noted Psych:  Normal affect   EKG:  The EKG was personally reviewed and demonstrates:  NSR with episodes of sinus bradycardia, HR in 50's to 60's.   Relevant CV Studies:  NST: 08/2019 The left ventricular ejection fraction is hyperdynamic (>65%). Nuclear stress EF: 68%. No wall motion abnormalities. There was no ST segment deviation noted during stress. The study is normal. This is a low risk study. No perfusion defects.  Echocardiogram: 04/2023 IMPRESSIONS     1. Left ventricular ejection fraction, by estimation, is 55 to 60%. The  left ventricle has normal function. The left ventricle has no regional  wall motion abnormalities.  Left ventricular diastolic parameters were  normal.   2. Right ventricular systolic function is normal. The right ventricular  size is normal. There is normal pulmonary artery systolic pressure.   3. The mitral valve is grossly normal. Trivial mitral valve  regurgitation. No evidence of mitral stenosis.   4. The aortic valve is grossly normal. There is mild calcification of the  aortic valve. There is mild thickening of the aortic valve. Aortic valve  regurgitation is not visualized. Aortic valve sclerosis/calcification is  present, without any evidence of  aortic stenosis.   5. Aortic dilatation noted. There is mild dilatation of the ascending  aorta, measuring 42 mm.   6. The inferior vena cava is normal in size with greater than 50%  respiratory variability, suggesting right atrial pressure of 3 mmHg.   Comparison(s): No significant change from prior study.   Event Monitor: 06/2023   Predominate rhythm is sinus rhythm   Rare episodes of supraventricular tachycardia .  the fastest episide was 5 beats at 176 bpm and the longest episode lasted 11 seconds with an avg. HR of 145 bpm.   1  episode of nonustanted VT lasting 9 beats   Rare PVCs, Rare PACs,   No seriour arrhythmias observed     Patch Wear Time:  13 days and 23 hours (2024-09-03T14:49:45-0400 to 2024-09-17T14:49:37-0400)   Patient had a min HR of 44 bpm, max HR of 176 bpm, and avg HR of 65 bpm. Predominant underlying rhythm was Sinus Rhythm. 1 run of Ventricular Tachycardia occurred lasting 9 beats with a max rate of 115 bpm (avg 103 bpm). 26 Supraventricular Tachycardia  runs occurred, the run with the fastest interval lasting 5 beats with a max rate of 176 bpm, the longest lasting 11.0 secs with an avg rate of 145 bpm. Isolated SVEs were occasional (1.8%, 23110), SVE Couplets were rare (<1.0%, 273), and SVE Triplets  were rare (<1.0%, 75). Isolated VEs were rare (<1.0%), VE Couplets were rare (<1.0%), and no VE Triplets were  present.     Carotid Dopplers: 07/2024 Summary:  Right Carotid: Widely patent distal CCA to distal ICA stent without  evidence of                 stenosis.   Left Carotid: Velocities in the left ICA are consistent with a 1-39%  stenosis.               Hemodynamically significant plaque >50% visualized in the  CCA.   Vertebrals: Bilateral vertebral arteries demonstrate antegrade flow.  Subclavians: Right subclavian artery flow was disturbed. Normal flow               hemodynamics were seen in the left subclavian artery.   Laboratory Data: High Sensitivity Troponin:   Recent Labs  Lab 09/17/24 1055 09/17/24 1634 09/17/24 2157 09/18/24 0512  TROPONINIHS 12 1,595* 2,003* 1,289*     Chemistry Recent Labs  Lab 09/17/24 1055 09/18/24 0512  NA 141 141  K 5.2* 4.0  CL 108 109  CO2 18* 23  GLUCOSE 116* 92  BUN 24* 21  CREATININE 1.47* 1.43*  CALCIUM  8.9 8.5*  MG 2.2  --   GFRNONAA 48* 50*  ANIONGAP 15 9    No results for input(s): PROT, ALBUMIN, AST, ALT, ALKPHOS, BILITOT in the last 168 hours. Lipids No results for input(s): CHOL, TRIG, HDL, LABVLDL, LDLCALC, CHOLHDL in the last 168 hours.  Hematology Recent Labs  Lab 09/17/24 1055 09/18/24 0512  WBC 9.0 9.8  RBC 5.31 4.85  HGB 14.5 13.1  HCT 45.9 40.4  MCV 86.4 83.3  MCH 27.3 27.0  MCHC 31.6 32.4  RDW 15.3 15.2  PLT 205 211   Thyroid   Recent Labs  Lab 09/17/24 1634  TSH 1.844    BNPNo results for input(s): BNP, PROBNP in the last 168 hours.  DDimer  Recent Labs  Lab 09/17/24 1634  DDIMER 0.54*    Radiology/Studies:  DG Chest Portable 1 View Result Date: 09/17/2024 EXAM: 1 VIEW(S) XRAY OF THE CHEST 09/17/2024 11:18:34 AM COMPARISON: 02/26/2024 CLINICAL HISTORY: SOB FINDINGS: LUNGS AND PLEURA: No focal pulmonary opacity. No pleural effusion. No pneumothorax. HEART AND MEDIASTINUM: Aortic calcification. No acute abnormality of the cardiac and mediastinal silhouettes. BONES  AND SOFT TISSUES: Partially visualized right shoulder arthroplasty. Severe degenerative changes of left shoulder. IMPRESSION: 1. No acute cardiopulmonary process to explain shortness of breath. Electronically signed by: Waddell Calk MD 09/17/2024 11:21 AM EST RP Workstation: [id number]     Assessment and Plan:  1. NSTEMI - He presented to the hospital after being found unresponsive in his car while at a gas station  and had previously exercised without consuming food or liquids. Denies any recent anginal symptoms while exercising.   - Found to have an NSTEMI with troponin values peaking at 2003. EKG with no acute ST changes. Echo pending and I have reached out to the echo techs and this will be performed in the next few hours.  - Given his enzyme elevation, Dr. Delford recommended a cardiac catheterization for definitive evaluation. This was reviewed with the patient and he is in agreement to proceed. Will add to the board for tomorrow morning. The patient understands that risks include but are not limited to stroke (1 in 1000), death (1 in 1000), kidney failure [usually temporary] (1 in 500), bleeding (1 in 200), allergic reaction [possibly serious] (1 in 200).   - Continue IV Heparin  along with ASA 81mg  daily and Atorvastatin  80mg  daily.   2. Paroxysmal Atrial Fibrillation - He has been having more frequent episodes of paroxysmal atrial fibrillation and required DCCV in 06/2024 but spontaneously converted back to normal sinus rhythm during ED evaluation in 07/2024. He was in atrial fibrillation at the time of this admission and converted back to normal sinus rhythm yesterday afternoon. Continue Cardizem  CD 120 mg daily for rate control. - He was on Eliquis  prior to admission but this has now been held and he is on IV Heparin  given his NSTEMI and the need for cardiac catheterization. - Given his initial syncope, would recommend an outpatient monitor to assess for high-grade AV block and  post-conversion pauses.   3. HTN - BP has overall been well-controlled, at 130/73 on most recent check. Continue Cardizem  CD 120 mg daily.  4. HLD - LDL was at 76 in 04/2023 but no recent FLP on file. Will recheck. He has been continued on Atorvastatin  80 mg daily.  5. Carotid Artery Stenosis - He previously underwent TCAR in 05/2023. Dopplers in 07/2024 showed a widely patent ICA stent and 1 to 39% stenosis along the LICA. Continue ASA and statin therapy.  6. Stage 3 CKD - Appears his baseline creatinine is around 1.3 - 1.4. At 1.43 today. He has been started on gentle hydration by the admitting team and would hold Jardiance  at this time.  7. Ascending Aorta Dilatation - This measured 42 mm by echo in 04/2023. Repeat echo pending.   Risk Assessment/Risk Scores:  TIMI Risk Score for Unstable Angina or Non-ST Elevation MI:   The patient's TIMI risk score is 3, which indicates a 13% risk of all cause mortality, new or recurrent myocardial infarction or need for urgent revascularization in the next 14 days.{   CHA2DS2-VASc Score = 6  This indicates a 9.7% annual risk of stroke. The patient's score is based upon: CHF History: 0 HTN History: 1 Diabetes History: 0 Stroke History: 2 Vascular Disease History: 1 Age Score: 2 Gender Score: 0    For questions or updates, please contact [facility] Please consult [email or web address] for contact info under    Signed, Laymon CHRISTELLA Qua, PA-C  09/18/2024 10:25 AM

## 2024-09-18 NOTE — Progress Notes (Signed)
 Echocardiogram 2D Echocardiogram has been performed.  Ellison Rieth N Rex Magee,RDCS 09/18/2024, 12:16 PM

## 2024-09-19 ENCOUNTER — Encounter (HOSPITAL_COMMUNITY): Admission: EM | Disposition: A | Payer: Self-pay | Source: Home / Self Care | Attending: Internal Medicine

## 2024-09-19 ENCOUNTER — Encounter (HOSPITAL_COMMUNITY): Payer: Self-pay | Admitting: Cardiology

## 2024-09-19 DIAGNOSIS — R404 Transient alteration of awareness: Secondary | ICD-10-CM | POA: Diagnosis not present

## 2024-09-19 DIAGNOSIS — I2510 Atherosclerotic heart disease of native coronary artery without angina pectoris: Secondary | ICD-10-CM

## 2024-09-19 DIAGNOSIS — R55 Syncope and collapse: Principal | ICD-10-CM

## 2024-09-19 DIAGNOSIS — N179 Acute kidney failure, unspecified: Secondary | ICD-10-CM

## 2024-09-19 HISTORY — PX: CORONARY STENT INTERVENTION: CATH118234

## 2024-09-19 HISTORY — PX: CORONARY PRESSURE/FFR WITH 3D MAPPING: CATH118309

## 2024-09-19 HISTORY — PX: LEFT HEART CATH AND CORONARY ANGIOGRAPHY: CATH118249

## 2024-09-19 LAB — BASIC METABOLIC PANEL WITH GFR
Anion gap: 10 (ref 5–15)
BUN: 20 mg/dL (ref 8–23)
CO2: 25 mmol/L (ref 22–32)
Calcium: 8.5 mg/dL — ABNORMAL LOW (ref 8.9–10.3)
Chloride: 105 mmol/L (ref 98–111)
Creatinine, Ser: 1.23 mg/dL (ref 0.61–1.24)
GFR, Estimated: 60 mL/min — ABNORMAL LOW (ref >60)
Glucose, Bld: 91 mg/dL (ref 70–99)
Potassium: 3.8 mmol/L (ref 3.5–5.1)
Sodium: 140 mmol/L (ref 135–145)

## 2024-09-19 LAB — CBC
HCT: 42.4 % (ref 39.0–52.0)
Hemoglobin: 14 g/dL (ref 13.0–17.0)
MCH: 27.2 pg (ref 26.0–34.0)
MCHC: 33 g/dL (ref 30.0–36.0)
MCV: 82.5 fL (ref 80.0–100.0)
Platelets: 193 K/uL (ref 150–400)
RBC: 5.14 MIL/uL (ref 4.22–5.81)
RDW: 15.1 % (ref 11.5–15.5)
WBC: 8.1 K/uL (ref 4.0–10.5)
nRBC: 0 % (ref 0.0–0.2)

## 2024-09-19 LAB — LIPID PANEL
Cholesterol: 143 mg/dL (ref 0–200)
HDL: 41 mg/dL (ref >40)
LDL Cholesterol: 66 mg/dL (ref 0–99)
Total CHOL/HDL Ratio: 3.5 ratio
Triglycerides: 179 mg/dL — ABNORMAL HIGH (ref <150)
VLDL: 36 mg/dL (ref 0–40)

## 2024-09-19 LAB — APTT: aPTT: 86 s — ABNORMAL HIGH (ref 24–36)

## 2024-09-19 LAB — HEPARIN LEVEL (UNFRACTIONATED): Heparin Unfractionated: 0.66 [IU]/mL (ref 0.30–0.70)

## 2024-09-19 LAB — POCT ACTIVATED CLOTTING TIME: Activated Clotting Time: 301 s

## 2024-09-19 SURGERY — LEFT HEART CATH AND CORONARY ANGIOGRAPHY
Anesthesia: LOCAL | Laterality: Right

## 2024-09-19 MED ORDER — VERAPAMIL HCL 2.5 MG/ML IV SOLN
INTRAVENOUS | Status: DC | PRN
  Administered 2024-09-19: 10 mL via INTRA_ARTERIAL

## 2024-09-19 MED ORDER — MIDAZOLAM HCL 2 MG/2ML IJ SOLN
INTRAMUSCULAR | Status: AC
  Filled 2024-09-19: qty 2

## 2024-09-19 MED ORDER — FENTANYL CITRATE (PF) 100 MCG/2ML IJ SOLN
INTRAMUSCULAR | Status: DC | PRN
  Administered 2024-09-19: 25 ug via INTRAVENOUS

## 2024-09-19 MED ORDER — LIDOCAINE HCL (PF) 1 % IJ SOLN
INTRAMUSCULAR | Status: DC | PRN
  Administered 2024-09-19: 5 mL

## 2024-09-19 MED ORDER — HEPARIN SODIUM (PORCINE) 1000 UNIT/ML IJ SOLN
INTRAMUSCULAR | Status: AC
  Filled 2024-09-19: qty 10

## 2024-09-19 MED ORDER — MIDAZOLAM HCL (PF) 2 MG/2ML IJ SOLN
INTRAMUSCULAR | Status: DC | PRN
  Administered 2024-09-19: 2 mg via INTRAVENOUS

## 2024-09-19 MED ORDER — HEPARIN SODIUM (PORCINE) 1000 UNIT/ML IJ SOLN
INTRAMUSCULAR | Status: DC | PRN
  Administered 2024-09-19: 4000 [IU] via INTRAVENOUS
  Administered 2024-09-19: 5000 [IU] via INTRAVENOUS

## 2024-09-19 MED ORDER — SODIUM CHLORIDE 0.9 % IV SOLN: 250.0000 mL | INTRAVENOUS | Status: DC | PRN

## 2024-09-19 MED ORDER — FREE WATER
500.0000 mL | Freq: Once | Status: AC
  Administered 2024-09-19: 500 mL via ORAL

## 2024-09-19 MED ORDER — CLOPIDOGREL BISULFATE 300 MG PO TABS
ORAL_TABLET | ORAL | Status: DC | PRN
  Administered 2024-09-19: 600 mg via ORAL

## 2024-09-19 MED ORDER — IOHEXOL 350 MG/ML SOLN
INTRAVENOUS | Status: DC | PRN
  Administered 2024-09-19: 90 mL

## 2024-09-19 MED ORDER — APIXABAN 5 MG PO TABS
5.0000 mg | ORAL_TABLET | Freq: Two times a day (BID) | ORAL | Status: DC
  Administered 2024-09-19 – 2024-09-20 (×2): 5 mg via ORAL
  Filled 2024-09-19 (×2): qty 1

## 2024-09-19 MED ORDER — FENTANYL CITRATE (PF) 100 MCG/2ML IJ SOLN
INTRAMUSCULAR | Status: AC
  Filled 2024-09-19: qty 2

## 2024-09-19 MED ORDER — SODIUM CHLORIDE 0.9% FLUSH: 3.0000 mL | INTRAVENOUS | Status: DC | PRN

## 2024-09-19 MED ORDER — LIDOCAINE HCL (PF) 1 % IJ SOLN
INTRAMUSCULAR | Status: AC
  Filled 2024-09-19: qty 30

## 2024-09-19 MED ORDER — CLOPIDOGREL BISULFATE 300 MG PO TABS
ORAL_TABLET | ORAL | Status: AC
  Filled 2024-09-19: qty 2

## 2024-09-19 MED ORDER — SODIUM CHLORIDE 0.9% FLUSH
3.0000 mL | Freq: Two times a day (BID) | INTRAVENOUS | Status: DC
  Administered 2024-09-20: 3 mL via INTRAVENOUS

## 2024-09-19 MED ORDER — HEPARIN (PORCINE) IN NACL 2000-0.9 UNIT/L-% IV SOLN
INTRAVENOUS | Status: DC | PRN
  Administered 2024-09-19: 1000 mL

## 2024-09-19 MED ORDER — VERAPAMIL HCL 2.5 MG/ML IV SOLN
INTRAVENOUS | Status: AC
  Filled 2024-09-19: qty 2

## 2024-09-19 MED ORDER — CLOPIDOGREL BISULFATE 75 MG PO TABS
75.0000 mg | ORAL_TABLET | Freq: Every day | ORAL | Status: DC
  Administered 2024-09-20: 75 mg via ORAL
  Filled 2024-09-19: qty 1

## 2024-09-19 SURGICAL SUPPLY — 15 items
BALLOON EMERGE MR 3.0X20 (BALLOONS) IMPLANT
CARD KEY FFR CATHWORX (MISCELLANEOUS) IMPLANT
CATH INFINITI AMBI 5FR TG (CATHETERS) IMPLANT
CATH INFINITI JR4 5F (CATHETERS) IMPLANT
CATH VISTA GUIDE 6FR AR1 MULPK (CATHETERS) IMPLANT
DEVICE RAD COMP TR BAND LRG (VASCULAR PRODUCTS) IMPLANT
GLIDESHEATH SLEND A-KIT 6F 22G (SHEATH) IMPLANT
GUIDEWIRE INQWIRE 1.5J.035X260 (WIRE) IMPLANT
KIT ENCORE 26 ADVANTAGE (KITS) IMPLANT
KIT HEMO VALVE WATCHDOG (MISCELLANEOUS) IMPLANT
KIT SINGLE USE MANIFOLD (KITS) IMPLANT
PACK CARDIAC CATHETERIZATION (CUSTOM PROCEDURE TRAY) ×2 IMPLANT
SET ATX-X65L (MISCELLANEOUS) IMPLANT
STENT SYNERGY XD 3.50X32 (Permanent Stent) IMPLANT
WIRE RUNTHROUGH .014X180CM (WIRE) IMPLANT

## 2024-09-19 NOTE — H&P (View-Only) (Signed)
 Progress Note  Patient Name: Logan Wright Date of Encounter: 09/19/2024  Primary Cardiologist: Maude Emmer, MD, previously Dr. Alveta  Subjective   Eager to get cath over with. No chest pain or SOB. He jokes that he has his lucky charm reindeer antlers for today.  Inpatient Medications    Scheduled Meds:  allopurinol   200 mg Oral Daily   aspirin  EC  81 mg Oral QHS   atorvastatin   80 mg Oral QHS   diltiazem   120 mg Oral Daily   latanoprost   1 drop Both Eyes QHS   sodium chloride  flush  3 mL Intravenous Q12H   Continuous Infusions:  sodium chloride  40 mL/hr at 09/18/24 1149   heparin  950 Units/hr (09/18/24 1805)   PRN Meds: acetaminophen  **OR** acetaminophen , albuterol , ondansetron  **OR** ondansetron  (ZOFRAN ) IV   Vital Signs    Vitals:   09/18/24 1909 09/19/24 0036 09/19/24 0400 09/19/24 0809  BP:  139/80 133/81   Pulse:  (!) 59 (!) 53   Resp:  15 16 16   Temp:  98.1 F (36.7 C) 98.1 F (36.7 C) 98.3 F (36.8 C)  TempSrc:  Oral Oral Oral  SpO2:  98% 98%   Weight: 81.2 kg     Height:        Intake/Output Summary (Last 24 hours) at 09/19/2024 0942 Last data filed at 09/19/2024 0818 Gross per 24 hour  Intake 500 ml  Output 1700 ml  Net -1200 ml      09/18/2024    7:09 PM 09/17/2024    2:34 PM 09/17/2024   10:48 AM  Last 3 Weights  Weight (lbs) 179 lb 185 lb 1.6 oz 178 lb  Weight (kg) 81.194 kg 83.961 kg 80.74 kg     Telemetry    NSR/SB 50s-60s - Personally Reviewed  Physical Exam   GEN: No acute distress.  HEENT: Normocephalic, atraumatic, sclera non-icteric. Neck: No JVD or bruits. Cardiac: RRR no murmurs, rubs, or gallops.  Respiratory: Clear to auscultation bilaterally. Breathing is unlabored. GI: Soft, nontender, non-distended, BS +x 4. MS: no deformity. Extremities: No clubbing or cyanosis. No edema. Distal pedal pulses are 2+ and equal bilaterally. Neuro:  AAOx3. Follows commands. Psych:  Responds to questions appropriately with a normal  affect.  Labs    High Sensitivity Troponin:   Recent Labs  Lab 09/17/24 1055 09/17/24 1634 09/17/24 2157 09/18/24 0512  TROPONINIHS 12 1,595* 2,003* 1,289*      Cardiac EnzymesNo results for input(s): TROPONINI in the last 168 hours. No results for input(s): TROPIPOC in the last 168 hours.   Chemistry Recent Labs  Lab 09/17/24 1055 09/18/24 0512 09/19/24 0652  NA 141 141 140  K 5.2* 4.0 3.8  CL 108 109 105  CO2 18* 23 25  GLUCOSE 116* 92 91  BUN 24* 21 20  CREATININE 1.47* 1.43* 1.23  CALCIUM  8.9 8.5* 8.5*  GFRNONAA 48* 50* 60*  ANIONGAP 15 9 10      Hematology Recent Labs  Lab 09/17/24 1055 09/18/24 0512 09/19/24 0652  WBC 9.0 9.8 8.1  RBC 5.31 4.85 5.14  HGB 14.5 13.1 14.0  HCT 45.9 40.4 42.4  MCV 86.4 83.3 82.5  MCH 27.3 27.0 27.2  MCHC 31.6 32.4 33.0  RDW 15.3 15.2 15.1  PLT 205 211 193    BNPNo results for input(s): BNP, PROBNP in the last 168 hours.   DDimer  Recent Labs  Lab 09/17/24 1634  DDIMER 0.54*     Radiology    ECHOCARDIOGRAM COMPLETE  Result Date: 09/18/2024    ECHOCARDIOGRAM REPORT   Patient Name:   Logan Wright Date of Exam: 09/18/2024 Medical Rec #:  [id number]      Height:       67.0 in Accession #:    [id number]     Weight:       185.1 lb Date of Birth:  October 25, 1945      BSA:          1.957 m Patient Age:    78 years       BP:           141/62 mmHg Patient Gender: M              HR:           66 bpm. Exam Location:  Inpatient Procedure: 2D Echo, Color Doppler, Cardiac Doppler and Strain Analysis (Both            Spectral and Color Flow Doppler were utilized during procedure). Indications:    Atrial Fibrillation  History:        Patient has prior history of Echocardiogram examinations, most                 recent 05/06/2023. Stroke and TIA, Arrythmias:Atrial Fibrillation                 and Dysrhythmia, Signs/Symptoms:Chest Pain; Risk                 Factors:Hypertension and Dyslipidemia. Breast Cancer, CKD stage                  3.  Sonographer:    Logan Shove RDCS Referring Phys: [phone] RONDELL A SMITH IMPRESSIONS  1. Left ventricular ejection fraction, by estimation, is 60 to 65%. The left ventricle has normal function. The left ventricle has no regional wall motion abnormalities. There is mild left ventricular hypertrophy. Left ventricular diastolic parameters are consistent with Grade I diastolic dysfunction (impaired relaxation). The average left ventricular global longitudinal strain is -20.6 %. The global longitudinal strain is normal.  2. Right ventricular systolic function is normal. The right ventricular size is normal.  3. The mitral valve is normal in structure. Trivial mitral valve regurgitation. No evidence of mitral stenosis.  4. Poor interogation of AV peak gradient 8 mmHg . The aortic valve is tricuspid. There is moderate calcification of the aortic valve. There is moderate thickening of the aortic valve. Aortic valve regurgitation is not visualized. Mild aortic valve stenosis.  5. Aortic dilatation noted. There is moderate dilatation of the ascending aorta, measuring 43 mm.  6. The inferior vena cava is normal in size with greater than 50% respiratory variability, suggesting right atrial pressure of 3 mmHg. FINDINGS  Left Ventricle: Left ventricular ejection fraction, by estimation, is 60 to 65%. The left ventricle has normal function. The left ventricle has no regional wall motion abnormalities. The average left ventricular global longitudinal strain is -20.6 %. Strain was performed and the global longitudinal strain is normal. The left ventricular internal cavity size was normal in size. There is mild left ventricular hypertrophy. Left ventricular diastolic parameters are consistent with Grade I diastolic dysfunction (impaired relaxation). Right Ventricle: The right ventricular size is normal. No increase in right ventricular wall thickness. Right ventricular systolic function is normal. Left Atrium: Left atrial size was  normal in size. Right Atrium: Right atrial size was normal in size. Pericardium: There is no evidence of pericardial effusion. Mitral Valve: The mitral valve  is normal in structure. Trivial mitral valve regurgitation. No evidence of mitral valve stenosis. Tricuspid Valve: The tricuspid valve is normal in structure. Tricuspid valve regurgitation is not demonstrated. No evidence of tricuspid stenosis. Aortic Valve: Poor interogation of AV peak gradient 8 mmHg. The aortic valve is tricuspid. There is moderate calcification of the aortic valve. There is moderate thickening of the aortic valve. Aortic valve regurgitation is not visualized. Mild aortic stenosis is present. Aortic valve peak gradient measures 9.7 mmHg. Pulmonic Valve: The pulmonic valve was normal in structure. Pulmonic valve regurgitation is not visualized. No evidence of pulmonic stenosis. Aorta: Aortic dilatation noted. There is moderate dilatation of the ascending aorta, measuring 43 mm. Venous: The inferior vena cava is normal in size with greater than 50% respiratory variability, suggesting right atrial pressure of 3 mmHg. IAS/Shunts: The interatrial septum appears to be lipomatous. No atrial level shunt detected by color flow Doppler. Additional Comments: 3D was performed not requiring image post processing on an independent workstation and was indeterminate.  LEFT VENTRICLE PLAX 2D LVIDd:         4.30 cm      Diastology LVIDs:         2.90 cm      LV e' medial:   5.44 cm/s LV PW:         1.10 cm      LV E/e' medial: 13.9 LV IVS:        1.30 cm LVOT diam:     1.90 cm      2D Longitudinal Strain LVOT Area:     2.84 cm     2D Strain GLS Avg:     -20.6 %  LV Volumes (MOD) LV vol d, MOD A2C: 116.0 ml LV vol d, MOD A4C: 97.0 ml LV vol s, MOD A2C: 41.9 ml LV vol s, MOD A4C: 36.3 ml LV SV MOD A2C:     74.1 ml LV SV MOD A4C:     97.0 ml LV SV MOD BP:      67.1 ml RIGHT VENTRICLE          IVC RV Basal diam:  2.90 cm  IVC diam: 1.50 cm                            PULMONARY VEINS                          Systolic Velocity: 74.60 cm/s LEFT ATRIUM             Index        RIGHT ATRIUM           Index LA diam:        3.30 cm 1.69 cm/m   RA Area:     13.00 cm LA Vol (A2C):   48.9 ml 24.99 ml/m  RA Volume:   26.50 ml  13.54 ml/m LA Vol (A4C):   28.0 ml 14.31 ml/m LA Biplane Vol: 37.2 ml 19.01 ml/m  AORTIC VALVE AV Area (Vmax): 1.60 cm AV Vmax:        155.67 cm/s AV Peak Grad:   9.7 mmHg LVOT Vmax:      88.05 cm/s  AORTA Ao Root diam: 3.40 cm Ao Asc diam:  4.30 cm MITRAL VALVE MV Area (PHT): 3.08 cm    SHUNTS MV Decel Time: 247 msec    Systemic Diam: 1.90 cm MV E velocity:  75.47 cm/s MV A velocity: 98.03 cm/s MV E/A ratio:  0.77 Maude Emmer MD Electronically signed by Maude Emmer MD Signature Date/Time: 09/18/2024/12:34:20 PM    Final    DG Chest Portable 1 View Result Date: 09/17/2024 EXAM: 1 VIEW(S) XRAY OF THE CHEST 09/17/2024 11:18:34 AM COMPARISON: 02/26/2024 CLINICAL HISTORY: SOB FINDINGS: LUNGS AND PLEURA: No focal pulmonary opacity. No pleural effusion. No pneumothorax. HEART AND MEDIASTINUM: Aortic calcification. No acute abnormality of the cardiac and mediastinal silhouettes. 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 IMPRESSION: 1. No acute cardiopulmonary process to explain shortness of breath. Electronically signed by: Waddell Calk MD 09/17/2024 11:21 AM EST RP Workstation: [id number]    Cardiac Studies   2d echo 09/18/24    1. Left ventricular ejection fraction, by estimation, is 60 to 65%. The  left ventricle has normal function. The left ventricle has no regional  wall motion abnormalities. There is mild left ventricular hypertrophy.  Left ventricular diastolic parameters  are consistent with Grade I diastolic dysfunction (impaired relaxation).  The average left ventricular global longitudinal strain is -20.6 %. The  global longitudinal strain is normal.   2.  Right ventricular systolic function is normal. The right ventricular  size is normal.   3. The mitral valve is normal in structure. Trivial mitral valve  regurgitation. No evidence of mitral stenosis.   4. Poor interogation of AV peak gradient 8 mmHg . The aortic valve is  tricuspid. There is moderate calcification of the aortic valve. There is  moderate thickening of the aortic valve. Aortic valve regurgitation is not  visualized. Mild aortic valve  stenosis.   5. Aortic dilatation noted. There is moderate dilatation of the ascending  aorta, measuring 43 mm.   6. The inferior vena cava is normal in size with greater than 50%  respiratory variability, suggesting right atrial pressure of 3 mmHg.    Patient Profile     78 y.o. 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 Presented back to [facility] ED on 09/17/2024 for evaluation of a syncopal episode while sitting in his car with associated chest pain at that time. EMS note states 12 lead was being placed as monitor pads were being accessed for application. No radial pulses were present at this time with thready carotid pulse. Initial 12 lead obtained as documented. As monitor pads were connected, patient heart rate noted to drop to 38 on the monitor with no palpable pulses. As soon as EMS personnel performed one chest compression, patient screamed and became alert and oriented times four, GCS 15. Repeat 12 leads as documented -> subsequent HR all tachycardic in 1teens - 150s with BP nadir 94/48.  Reported having gone to workout and was biking for over 45 minutes but had not consumed food or fluids. He received a 1 L fluid bolus while in the ED and given his  elevated troponin values, was started on IV Heparin . He was continued on Cardizem  CD 120 mg daily. He did convert back to normal sinus rhythm on 12/6, was not on telemetry at the time of conversion to NSR.    Assessment & Plan    1. Syncope of unclear etiology, NSTEMI with peak troponin 2003 -  EMS run sheet: hypotensive in 90s/40s with EMS, ? Transient bradycardia to 38 on monitor but subsequent HR all 1teens-140s (AF RVR), not on telemetry at time of conversion to NSR again here (there was a 1 minute interruption in telemetry between the time he was in AF then was back in NSR by time he was placed back on it). Syncope possibly due to hypotension in setting of poor oral intake. Labs also suggested pre-renal mild AKI - last dose of Eliquis  12/6 AM, on heparin  per pharmacy - echo showing EF 60-65%, mild LVH, G1DD, normal RV, mild AS, moderate aortic dilation - plan for cardiac cath for evaluation of NSTEMI - continue ASA, atorvastatin  - PE felt less likely given normal RV, on OAC, and d-dimer normal for age-adjusted value - will need MD input regarding refraining from driving  2. Paroxysmal Atrial Fibrillation, ? Transient bradycardia - He has been having more frequent episodes of paroxysmal atrial fibrillation and required DCCV in 06/2024, recurrence of narrow complex tachycardia s/p spontaneously conversion back to normal sinus rhythm during ED evaluation in 07/2024. He was in atrial fibrillation at the time of this admission and converted back to normal sinus rhythm yesterday afternoon. Continue Cardizem  CD 120 mg daily for rate control. - He was on Eliquis  prior to admission but this has now been held and he is on IV Heparin  given his NSTEMI and the need for cardiac catheterization. - Given his initial syncope, would recommend an outpatient monitor to assess for high-grade AV block and post-conversion pauses. Will need to notify cardiology when being discharged to ensure arranged. - Consider  discussion with EP post cath given recent issues with recurrences   3. HTN - BP OK   4. HLD - LDL 66, trig 179 -> continue atorvastatin  80mg  daily   5. 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 Continue ASA and statin therapy - Will need discussion of aneurysm surveillance as OP - since <24mm, recommendations are for repeat CTA vs MRA every 2-3 years (so due 2026-2027)   6. Mild AKI on Stage 3a CKD - Appears his baseline creatinine is around 1.3 - 1.4, mild pre-renal findings on admission, stable today   7. 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  Signed, Dawnyel Leven N Jonathan Kirkendoll, PA-C 09/19/2024, 9:42 AM

## 2024-09-19 NOTE — Interval H&P Note (Signed)
 History and Physical Interval Note:  09/19/2024 2:34 PM  Logan Wright  has presented today for surgery, with the diagnosis of NSTEMI.  The various methods of treatment have been discussed with the patient and family. After consideration of risks, benefits and other options for treatment, the patient has consented to  Procedure(s): LEFT HEART CATH AND CORONARY ANGIOGRAPHY (N/A) and coronary angioplasty as a surgical intervention.  The patient's history has been reviewed, patient examined, no change in status, stable for surgery.  I have reviewed the patient's chart and labs.  Questions were answered to the patient's satisfaction.     Gordy Bergamo

## 2024-09-19 NOTE — Plan of Care (Signed)
   Problem: Education: Goal: Knowledge of General Education information will improve Description: Including pain rating scale, medication(s)/side effects and non-pharmacologic comfort measures Outcome: Progressing   Problem: Activity: Goal: Risk for activity intolerance will decrease Outcome: Progressing   Problem: Nutrition: Goal: Adequate nutrition will be maintained Outcome: Progressing

## 2024-09-19 NOTE — Progress Notes (Addendum)
 Progress Note  Patient Name: Logan Wright Date of Encounter: 09/19/2024  Primary Cardiologist: Maude Emmer, MD, previously Dr. Alveta  Subjective   Eager to get cath over with. No chest pain or SOB. He jokes that he has his lucky charm reindeer antlers for today.  Inpatient Medications    Scheduled Meds:  allopurinol   200 mg Oral Daily   aspirin  EC  81 mg Oral QHS   atorvastatin   80 mg Oral QHS   diltiazem   120 mg Oral Daily   latanoprost   1 drop Both Eyes QHS   sodium chloride  flush  3 mL Intravenous Q12H   Continuous Infusions:  sodium chloride  40 mL/hr at 09/18/24 1149   heparin  950 Units/hr (09/18/24 1805)   PRN Meds: acetaminophen  **OR** acetaminophen , albuterol , ondansetron  **OR** ondansetron  (ZOFRAN ) IV   Vital Signs    Vitals:   09/18/24 1909 09/19/24 0036 09/19/24 0400 09/19/24 0809  BP:  139/80 133/81   Pulse:  (!) 59 (!) 53   Resp:  15 16 16   Temp:  98.1 F (36.7 C) 98.1 F (36.7 C) 98.3 F (36.8 C)  TempSrc:  Oral Oral Oral  SpO2:  98% 98%   Weight: 81.2 kg     Height:        Intake/Output Summary (Last 24 hours) at 09/19/2024 0942 Last data filed at 09/19/2024 0818 Gross per 24 hour  Intake 500 ml  Output 1700 ml  Net -1200 ml      09/18/2024    7:09 PM 09/17/2024    2:34 PM 09/17/2024   10:48 AM  Last 3 Weights  Weight (lbs) 179 lb 185 lb 1.6 oz 178 lb  Weight (kg) 81.194 kg 83.961 kg 80.74 kg     Telemetry    NSR/SB 50s-60s - Personally Reviewed  Physical Exam   GEN: No acute distress.  HEENT: Normocephalic, atraumatic, sclera non-icteric. Neck: No JVD or bruits. Cardiac: RRR no murmurs, rubs, or gallops.  Respiratory: Clear to auscultation bilaterally. Breathing is unlabored. GI: Soft, nontender, non-distended, BS +x 4. MS: no deformity. Extremities: No clubbing or cyanosis. No edema. Distal pedal pulses are 2+ and equal bilaterally. Neuro:  AAOx3. Follows commands. Psych:  Responds to questions appropriately with a normal  affect.  Labs    High Sensitivity Troponin:   Recent Labs  Lab 09/17/24 1055 09/17/24 1634 09/17/24 2157 09/18/24 0512  TROPONINIHS 12 1,595* 2,003* 1,289*      Cardiac EnzymesNo results for input(s): TROPONINI in the last 168 hours. No results for input(s): TROPIPOC in the last 168 hours.   Chemistry Recent Labs  Lab 09/17/24 1055 09/18/24 0512 09/19/24 0652  NA 141 141 140  K 5.2* 4.0 3.8  CL 108 109 105  CO2 18* 23 25  GLUCOSE 116* 92 91  BUN 24* 21 20  CREATININE 1.47* 1.43* 1.23  CALCIUM  8.9 8.5* 8.5*  GFRNONAA 48* 50* 60*  ANIONGAP 15 9 10      Hematology Recent Labs  Lab 09/17/24 1055 09/18/24 0512 09/19/24 0652  WBC 9.0 9.8 8.1  RBC 5.31 4.85 5.14  HGB 14.5 13.1 14.0  HCT 45.9 40.4 42.4  MCV 86.4 83.3 82.5  MCH 27.3 27.0 27.2  MCHC 31.6 32.4 33.0  RDW 15.3 15.2 15.1  PLT 205 211 193    BNPNo results for input(s): BNP, PROBNP in the last 168 hours.   DDimer  Recent Labs  Lab 09/17/24 1634  DDIMER 0.54*     Radiology    ECHOCARDIOGRAM COMPLETE  Result Date: 09/18/2024    ECHOCARDIOGRAM REPORT   Patient Name:   DESHUN SEDIVY Date of Exam: 09/18/2024 Medical Rec #:  [id number]      Height:       67.0 in Accession #:    [id number]     Weight:       185.1 lb Date of Birth:  October 25, 1945      BSA:          1.957 m Patient Age:    56 years       BP:           141/62 mmHg Patient Gender: M              HR:           66 bpm. Exam Location:  Inpatient Procedure: 2D Echo, Color Doppler, Cardiac Doppler and Strain Analysis (Both            Spectral and Color Flow Doppler were utilized during procedure). Indications:    Atrial Fibrillation  History:        Patient has prior history of Echocardiogram examinations, most                 recent 05/06/2023. Stroke and TIA, Arrythmias:Atrial Fibrillation                 and Dysrhythmia, Signs/Symptoms:Chest Pain; Risk                 Factors:Hypertension and Dyslipidemia. Breast Cancer, CKD stage                  3.  Sonographer:    Logan Shove RDCS Referring Phys: [phone] RONDELL A SMITH IMPRESSIONS  1. Left ventricular ejection fraction, by estimation, is 60 to 65%. The left ventricle has normal function. The left ventricle has no regional wall motion abnormalities. There is mild left ventricular hypertrophy. Left ventricular diastolic parameters are consistent with Grade I diastolic dysfunction (impaired relaxation). The average left ventricular global longitudinal strain is -20.6 %. The global longitudinal strain is normal.  2. Right ventricular systolic function is normal. The right ventricular size is normal.  3. The mitral valve is normal in structure. Trivial mitral valve regurgitation. No evidence of mitral stenosis.  4. Poor interogation of AV peak gradient 8 mmHg . The aortic valve is tricuspid. There is moderate calcification of the aortic valve. There is moderate thickening of the aortic valve. Aortic valve regurgitation is not visualized. Mild aortic valve stenosis.  5. Aortic dilatation noted. There is moderate dilatation of the ascending aorta, measuring 43 mm.  6. The inferior vena cava is normal in size with greater than 50% respiratory variability, suggesting right atrial pressure of 3 mmHg. FINDINGS  Left Ventricle: Left ventricular ejection fraction, by estimation, is 60 to 65%. The left ventricle has normal function. The left ventricle has no regional wall motion abnormalities. The average left ventricular global longitudinal strain is -20.6 %. Strain was performed and the global longitudinal strain is normal. The left ventricular internal cavity size was normal in size. There is mild left ventricular hypertrophy. Left ventricular diastolic parameters are consistent with Grade I diastolic dysfunction (impaired relaxation). Right Ventricle: The right ventricular size is normal. No increase in right ventricular wall thickness. Right ventricular systolic function is normal. Left Atrium: Left atrial size was  normal in size. Right Atrium: Right atrial size was normal in size. Pericardium: There is no evidence of pericardial effusion. Mitral Valve: The mitral valve  is normal in structure. Trivial mitral valve regurgitation. No evidence of mitral valve stenosis. Tricuspid Valve: The tricuspid valve is normal in structure. Tricuspid valve regurgitation is not demonstrated. No evidence of tricuspid stenosis. Aortic Valve: Poor interogation of AV peak gradient 8 mmHg. The aortic valve is tricuspid. There is moderate calcification of the aortic valve. There is moderate thickening of the aortic valve. Aortic valve regurgitation is not visualized. Mild aortic stenosis is present. Aortic valve peak gradient measures 9.7 mmHg. Pulmonic Valve: The pulmonic valve was normal in structure. Pulmonic valve regurgitation is not visualized. No evidence of pulmonic stenosis. Aorta: Aortic dilatation noted. There is moderate dilatation of the ascending aorta, measuring 43 mm. Venous: The inferior vena cava is normal in size with greater than 50% respiratory variability, suggesting right atrial pressure of 3 mmHg. IAS/Shunts: The interatrial septum appears to be lipomatous. No atrial level shunt detected by color flow Doppler. Additional Comments: 3D was performed not requiring image post processing on an independent workstation and was indeterminate.  LEFT VENTRICLE PLAX 2D LVIDd:         4.30 cm      Diastology LVIDs:         2.90 cm      LV e' medial:   5.44 cm/s LV PW:         1.10 cm      LV E/e' medial: 13.9 LV IVS:        1.30 cm LVOT diam:     1.90 cm      2D Longitudinal Strain LVOT Area:     2.84 cm     2D Strain GLS Avg:     -20.6 %  LV Volumes (MOD) LV vol d, MOD A2C: 116.0 ml LV vol d, MOD A4C: 97.0 ml LV vol s, MOD A2C: 41.9 ml LV vol s, MOD A4C: 36.3 ml LV SV MOD A2C:     74.1 ml LV SV MOD A4C:     97.0 ml LV SV MOD BP:      67.1 ml RIGHT VENTRICLE          IVC RV Basal diam:  2.90 cm  IVC diam: 1.50 cm                            PULMONARY VEINS                          Systolic Velocity: 74.60 cm/s LEFT ATRIUM             Index        RIGHT ATRIUM           Index LA diam:        3.30 cm 1.69 cm/m   RA Area:     13.00 cm LA Vol (A2C):   48.9 ml 24.99 ml/m  RA Volume:   26.50 ml  13.54 ml/m LA Vol (A4C):   28.0 ml 14.31 ml/m LA Biplane Vol: 37.2 ml 19.01 ml/m  AORTIC VALVE AV Area (Vmax): 1.60 cm AV Vmax:        155.67 cm/s AV Peak Grad:   9.7 mmHg LVOT Vmax:      88.05 cm/s  AORTA Ao Root diam: 3.40 cm Ao Asc diam:  4.30 cm MITRAL VALVE MV Area (PHT): 3.08 cm    SHUNTS MV Decel Time: 247 msec    Systemic Diam: 1.90 cm MV E velocity:  75.47 cm/s MV A velocity: 98.03 cm/s MV E/A ratio:  0.77 Maude Emmer MD Electronically signed by Maude Emmer MD Signature Date/Time: 09/18/2024/12:34:20 PM    Final    DG Chest Portable 1 View Result Date: 09/17/2024 EXAM: 1 VIEW(S) XRAY OF THE CHEST 09/17/2024 11:18:34 AM COMPARISON: 02/26/2024 CLINICAL HISTORY: SOB FINDINGS: LUNGS AND PLEURA: No focal pulmonary opacity. No pleural effusion. No pneumothorax. HEART AND MEDIASTINUM: Aortic calcification. No acute abnormality of the cardiac and mediastinal silhouettes. 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 IMPRESSION: 1. No acute cardiopulmonary process to explain shortness of breath. Electronically signed by: Waddell Calk MD 09/17/2024 11:21 AM EST RP Workstation: [id number]    Cardiac Studies   2d echo 09/18/24    1. Left ventricular ejection fraction, by estimation, is 60 to 65%. The  left ventricle has normal function. The left ventricle has no regional  wall motion abnormalities. There is mild left ventricular hypertrophy.  Left ventricular diastolic parameters  are consistent with Grade I diastolic dysfunction (impaired relaxation).  The average left ventricular global longitudinal strain is -20.6 %. The  global longitudinal strain is normal.   2.  Right ventricular systolic function is normal. The right ventricular  size is normal.   3. The mitral valve is normal in structure. Trivial mitral valve  regurgitation. No evidence of mitral stenosis.   4. Poor interogation of AV peak gradient 8 mmHg . The aortic valve is  tricuspid. There is moderate calcification of the aortic valve. There is  moderate thickening of the aortic valve. Aortic valve regurgitation is not  visualized. Mild aortic valve  stenosis.   5. Aortic dilatation noted. There is moderate dilatation of the ascending  aorta, measuring 43 mm.   6. The inferior vena cava is normal in size with greater than 50%  respiratory variability, suggesting right atrial pressure of 3 mmHg.    Patient Profile     78 y.o. 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 Presented back to [facility] ED on 09/17/2024 for evaluation of a syncopal episode while sitting in his car with associated chest pain at that time. EMS note states 12 lead was being placed as monitor pads were being accessed for application. No radial pulses were present at this time with thready carotid pulse. Initial 12 lead obtained as documented. As monitor pads were connected, patient heart rate noted to drop to 38 on the monitor with no palpable pulses. As soon as EMS personnel performed one chest compression, patient screamed and became alert and oriented times four, GCS 15. Repeat 12 leads as documented -> subsequent HR all tachycardic in 1teens - 150s with BP nadir 94/48.  Reported having gone to workout and was biking for over 45 minutes but had not consumed food or fluids. He received a 1 L fluid bolus while in the ED and given his  elevated troponin values, was started on IV Heparin . He was continued on Cardizem  CD 120 mg daily. He did convert back to normal sinus rhythm on 12/6, was not on telemetry at the time of conversion to NSR.    Assessment & Plan    1. Syncope of unclear etiology, NSTEMI with peak troponin 2003 -  EMS run sheet: hypotensive in 90s/40s with EMS, ? Transient bradycardia to 38 on monitor but subsequent HR all 1teens-140s (AF RVR), not on telemetry at time of conversion to NSR again here (there was a 1 minute interruption in telemetry between the time he was in AF then was back in NSR by time he was placed back on it). Syncope possibly due to hypotension in setting of poor oral intake. Labs also suggested pre-renal mild AKI - last dose of Eliquis  12/6 AM, on heparin  per pharmacy - echo showing EF 60-65%, mild LVH, G1DD, normal RV, mild AS, moderate aortic dilation - plan for cardiac cath for evaluation of NSTEMI - continue ASA, atorvastatin  - PE felt less likely given normal RV, on OAC, and d-dimer normal for age-adjusted value - will need MD input regarding refraining from driving  2. Paroxysmal Atrial Fibrillation, ? Transient bradycardia - He has been having more frequent episodes of paroxysmal atrial fibrillation and required DCCV in 06/2024, recurrence of narrow complex tachycardia s/p spontaneously conversion back to normal sinus rhythm during ED evaluation in 07/2024. He was in atrial fibrillation at the time of this admission and converted back to normal sinus rhythm yesterday afternoon. Continue Cardizem  CD 120 mg daily for rate control. - He was on Eliquis  prior to admission but this has now been held and he is on IV Heparin  given his NSTEMI and the need for cardiac catheterization. - Given his initial syncope, would recommend an outpatient monitor to assess for high-grade AV block and post-conversion pauses. Will need to notify cardiology when being discharged to ensure arranged. - Consider  discussion with EP post cath given recent issues with recurrences   3. HTN - BP OK   4. HLD - LDL 66, trig 179 -> continue atorvastatin  80mg  daily   5. 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 Continue ASA and statin therapy - Will need discussion of aneurysm surveillance as OP - since <24mm, recommendations are for repeat CTA vs MRA every 2-3 years (so due 2026-2027)   6. Mild AKI on Stage 3a CKD - Appears his baseline creatinine is around 1.3 - 1.4, mild pre-renal findings on admission, stable today   7. 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  Signed, Dawnyel Leven N Jonathan Kirkendoll, PA-C 09/19/2024, 9:42 AM

## 2024-09-19 NOTE — Progress Notes (Signed)
 PHARMACY - ANTICOAGULATION CONSULT NOTE  Pharmacy Consult for heparin   Indication: atrial fibrillation  No Known Allergies  Patient Measurements: Height: 5' 7 (170.2 cm) Weight: 81.2 kg (179 lb) IBW/kg (Calculated) : 66.1 HEPARIN  DW (KG): 83  Vital Signs: Temp: 98.1 F (36.7 C) (12/08 0400) Temp Source: Oral (12/08 0400) BP: 133/81 (12/08 0400) Pulse Rate: 53 (12/08 0400)  Labs: Recent Labs    09/17/24 1055 09/17/24 1634 09/17/24 2157 09/18/24 0512 09/18/24 1413 09/19/24 0652  HGB 14.5  --   --  13.1  --  14.0  HCT 45.9  --   --  40.4  --  42.4  PLT 205  --   --  211  --  193  APTT  --   --   --  104* 106* 86*  HEPARINUNFRC  --   --   --  >1.10*  --  0.66  CREATININE 1.47*  --   --  1.43*  --  1.23  TROPONINIHS 12 1,595* 2,003* 1,289*  --   --     Estimated Creatinine Clearance: 49.7 mL/min (by C-G formula based on SCr of 1.23 mg/dL).   Medical History: Past Medical History:  Diagnosis Date   Ascending aorta dilatation    Ascending aorta dilation 03/30/2023   Echocardiogram 07/2020: 42 mm   Breast cancer (HCC) 2021   right breast   Breast cancer, right (HCC)    CKD (chronic kidney disease), stage III (HCC)    Colon adenoma 2015   Coronary artery calcification seen on CT scan    Diverticulosis    DENIES    DJD (degenerative joint disease)    Dysrhythmia    A-fib   ED (erectile dysfunction)    Elevated blood pressure 2007-2008   Esophageal reflux    GERD (gastroesophageal reflux disease)    Gout    Gynecomastia    LEFT   Hearing loss    HTN (hypertension)    Hypercholesteremia    Non-cardiac chest pain    OA (osteoarthritis)    LEFT SHOULDER   Onychomycosis of foot with other complication    PAF (paroxysmal atrial fibrillation) (HCC)    Pre-diabetes    Seborrhea    Severe frontal headaches    RESOLVED    Stroke (HCC)    Vertigo    RESOLVED    Wears glasses    Wears hearing aid     Medications:  Medications Prior to Admission   Medication Sig Dispense Refill Last Dose/Taking   allopurinol  (ZYLOPRIM ) 100 MG tablet Take 200 mg by mouth daily.   09/17/2024 at  8:00 AM   apixaban  (ELIQUIS ) 5 MG TABS tablet Take 1 tablet by mouth twice daily 180 tablet 1 09/17/2024 at  8:00 AM   aspirin  EC 81 MG tablet Take 81 mg by mouth daily. Swallow whole.   Past Week   atorvastatin  (LIPITOR ) 80 MG tablet Take 80 mg by mouth at bedtime.   Past Week   COMIRNATY syringe Inject 0.3 mLs into the muscle once.   07/26/2024   diltiazem  (CARDIZEM  CD) 120 MG 24 hr capsule Take 1 capsule (120 mg total) by mouth daily. 90 capsule 1 09/17/2024 at  8:00 AM   diphenhydrAMINE -zinc acetate (BENADRYL ) cream Apply 1 Application topically 3 (three) times daily as needed for itching.   Unknown   empagliflozin  (JARDIANCE ) 10 MG TABS tablet Take 10 mg by mouth daily.   09/17/2024 at  8:00 AM   FLUZONE HIGH-DOSE 0.5 ML injection Inject  0.5 mLs into the muscle once.   07/26/2024   GLUCOSAMINE-CHONDROITIN PO Take 2 tablets by mouth daily.   09/17/2024 at  8:00 AM   latanoprost  (XALATAN ) 0.005 % ophthalmic solution Place 1 drop into both eyes at bedtime.   09/16/2024 at  8:00 PM   Multiple Vitamin (MULTIVITAMIN) tablet Take 1 tablet by mouth daily.    09/17/2024 at  8:00 AM   Omega-3 Fatty Acids (FISH OIL ULTRA) 1400 MG CAPS Take 1,400-2,800 mg by mouth See admin instructions. Take 1400 mg in the morning and 2800 mg in the evening   09/17/2024 at  8:00 AM   Potassium 99 MG TABS Take 1 tablet by mouth daily.   09/17/2024 at  8:00 AM   tamoxifen  (NOLVADEX ) 20 MG tablet Take 1 tablet by mouth once daily 90 tablet 3 09/17/2024 at  8:00 AM   Scheduled:   allopurinol   200 mg Oral Daily   aspirin  EC  81 mg Oral QHS   atorvastatin   80 mg Oral QHS   diltiazem   120 mg Oral Daily   latanoprost   1 drop Both Eyes QHS   sodium chloride  flush  3 mL Intravenous Q12H   Infusions:   sodium chloride  40 mL/hr at 09/18/24 1149   heparin  950 Units/hr (09/18/24 1805)     Assessment: 1 yoM admitted with syncope and elevated troponins. Pt on apixaban  PTA for hx AF, held for IV heparin .  aPTT and heparin  level therapeutic, not quite correlating. CBC stable. Cath later today.  Goal of Therapy:  Heparin  level 0.3-0.7 units/ml aPTT 66-102 seconds Monitor platelets by anticoagulation protocol: Yes   Plan:  Continue heparin  950 units/h Daily heparin  level, aPTT, CBC  Ozell Jamaica, PharmD, BCPS, [facility] Clinical Pharmacist [phone] Please check AMION for all [facility] Pharmacy numbers 09/19/2024

## 2024-09-19 NOTE — Plan of Care (Signed)
  Problem: Education: Goal: Knowledge of General Education information will improve Description: Including pain rating scale, medication(s)/side effects and non-pharmacologic comfort measures Outcome: Progressing   Problem: Clinical Measurements: Goal: Ability to maintain clinical measurements within normal limits will improve Outcome: Progressing Goal: Respiratory complications will improve Outcome: Progressing Goal: Cardiovascular complication will be avoided Outcome: Progressing   Problem: Activity: Goal: Risk for activity intolerance will decrease Outcome: Progressing   Problem: Nutrition: Goal: Adequate nutrition will be maintained Outcome: Progressing   Problem: Coping: Goal: Level of anxiety will decrease Outcome: Progressing   Problem: Elimination: Goal: Will not experience complications related to urinary retention Outcome: Progressing   Problem: Pain Managment: Goal: General experience of comfort will improve and/or be controlled Outcome: Progressing   Problem: Safety: Goal: Ability to remain free from injury will improve Outcome: Progressing   Problem: Skin Integrity: Goal: Risk for impaired skin integrity will decrease Outcome: Progressing

## 2024-09-19 NOTE — Progress Notes (Signed)
 PROGRESS NOTE    [name]  FMW:[id number] DOB: 04-11-1946 DOA: 09/17/2024 PCP: Dayna Motto, DO    Brief Narrative:  Logan Wright is a 78 y.o. male with medical history significant of paroxysmal A-fib, hypertension, HLD, CAD, ascending aorta, right breast cancer s/p masectomy presents after having episode of unresponsiveness.   Patient recalls getting up this morning and going to the gym and riding the stationary bike for approximately 45 minutes without eating breakfast or drinking any fluids he did take his morning medications which included Cardizem .  He recalls going to the gas station to get a cup of coffee and thereafter the next thing he recalls is waking up in the hospital.  There is reported that patient was found unresponsive sitting in his car which it was unclear how long he had been there.  EMS reported patient's heart rates dropped into the 30s at 1 point for which they attempted to start compressions but stopped quickly as the patient screamed.  Patient does report having some discomfort with taking a deep breath in, but thinks it secondary to where they were doing compressions.    Assessment and Plan: Episode of unresponsiveness Patient presents after having episode of unresponsiveness while at the gas station.  Patient was found with heart rates down into the 30s after reportedly working out before eating or drinking anything this morning.  Initial high-sensitivity troponin negative.  Compressions started and stopped by EMS shortly after patient woke up screaming.  Lower suspicion for pulmonary embolism as patient reports compliance with Eliquis . - echo: Left ventricular ejection fraction, by estimation, is 60 to 65%. The  left ventricle has normal function. The left ventricle has no regional  wall motion abnormalities.  Grade I diastolic dysfunction (impaired relaxation).  -heart cath 12/8-- hydrate prior -heparin  gtt  Paroxysmal atrial fibrillation Patient was found  in atrial fibrillation with heart rates in the 110s.  TSH noted to be within normal limits.   -converted to sinus - Continue Cardizem  - heparin  gtt   Hyperkalemia -resolved   Essential hypertension Blood pressures currently maintained. - Continue Cardizem    aKI on Chronic kidney disease stage IIIa Creatinine noted to be 1.47 with BUN 24. - stable -hydrate prior to heart cath in AM   History of prediabetes On admission glucose noted to be 116.  Last available hemoglobin A1c noted to be 5.5 when checked back in 04/2023. - hold Jardiance    Hyperlipidemia - Continue statin       DVT prophylaxis:     Code Status: Full Code   Disposition Plan:  Level of care: Telemetry Status is: Observation     Consultants:  cards   Subjective: Ready for cath  Objective: Vitals:   09/18/24 1909 09/19/24 0036 09/19/24 0400 09/19/24 0809  BP:  139/80 133/81 (!) 145/83  Pulse:  (!) 59 (!) 53 (!) 53  Resp:  15 16 16   Temp:  98.1 F (36.7 C) 98.1 F (36.7 C) 98.3 F (36.8 C)  TempSrc:  Oral Oral Oral  SpO2:  98% 98% 98%  Weight: 81.2 kg     Height:        Intake/Output Summary (Last 24 hours) at 09/19/2024 1001 Last data filed at 09/19/2024 0818 Gross per 24 hour  Intake 500 ml  Output 1700 ml  Net -1200 ml   Filed Weights   09/17/24 1048 09/17/24 1434 09/18/24 1909  Weight: 80.7 kg 84 kg 81.2 kg    Examination:   General: Appearance:  Overweight male in no acute distress     Lungs:     respirations unlabored  Heart:    Bradycardic.    MS:   All extremities are intact.    Neurologic:   Awake, alert       Data Reviewed: I have personally reviewed following labs and imaging studies  CBC: Recent Labs  Lab 09/17/24 1055 09/18/24 0512 09/19/24 0652  WBC 9.0 9.8 8.1  HGB 14.5 13.1 14.0  HCT 45.9 40.4 42.4  MCV 86.4 83.3 82.5  PLT 205 211 193   Basic Metabolic Panel: Recent Labs  Lab 09/17/24 1055 09/18/24 0512 09/19/24 0652  NA 141 141 140   K 5.2* 4.0 3.8  CL 108 109 105  CO2 18* 23 25  GLUCOSE 116* 92 91  BUN 24* 21 20  CREATININE 1.47* 1.43* 1.23  CALCIUM  8.9 8.5* 8.5*  MG 2.2  --   --    GFR: Estimated Creatinine Clearance: 49.7 mL/min (by C-G formula based on SCr of 1.23 mg/dL). Liver Function Tests: No results for input(s): AST, ALT, ALKPHOS, BILITOT, PROT, ALBUMIN in the last 168 hours. No results for input(s): LIPASE, AMYLASE in the last 168 hours. No results for input(s): AMMONIA in the last 168 hours. Coagulation Profile: No results for input(s): INR, PROTIME in the last 168 hours. Cardiac Enzymes: No results for input(s): CKTOTAL, CKMB, CKMBINDEX, TROPONINI in the last 168 hours. BNP (last 3 results) No results for input(s): PROBNP in the last 8760 hours. HbA1C: No results for input(s): HGBA1C in the last 72 hours. CBG: No results for input(s): GLUCAP in the last 168 hours. Lipid Profile: Recent Labs    09/19/24 0652  CHOL 143  HDL 41  LDLCALC 66  TRIG 179*  CHOLHDL 3.5   Thyroid  Function Tests: Recent Labs    09/17/24 1634  TSH 1.844   Anemia Panel: No results for input(s): VITAMINB12, FOLATE, FERRITIN, TIBC, IRON, RETICCTPCT in the last 72 hours. Sepsis Labs: No results for input(s): PROCALCITON, LATICACIDVEN in the last 168 hours.  No results found for this or any previous visit (from the past 240 hours).       Radiology Studies: ECHOCARDIOGRAM COMPLETE Result Date: 09/18/2024    ECHOCARDIOGRAM REPORT   Patient Name:   Logan Wright Date of Exam: 09/18/2024 Medical Rec #:  [id number]      Height:       67.0 in Accession #:    [id number]     Weight:       185.1 lb Date of Birth:  [birth year]      BSA:          1.957 m Patient Age:    78 years       BP:           141/62 mmHg Patient Gender: M              HR:           66 bpm. Exam Location:  Inpatient Procedure: 2D Echo, Color Doppler, Cardiac Doppler and Strain Analysis (Both             Spectral and Color Flow Doppler were utilized during procedure). Indications:    Atrial Fibrillation  History:        Patient has prior history of Echocardiogram examinations, most                 recent 05/06/2023. Stroke and TIA, Arrythmias:Atrial Fibrillation  and Dysrhythmia, Signs/Symptoms:Chest Pain; Risk                 Factors:Hypertension and Dyslipidemia. Breast Cancer, CKD stage                 3.  Sonographer:    Logan Shove RDCS Referring Phys: [phone] RONDELL A SMITH IMPRESSIONS  1. Left ventricular ejection fraction, by estimation, is 60 to 65%. The left ventricle has normal function. The left ventricle has no regional wall motion abnormalities. There is mild left ventricular hypertrophy. Left ventricular diastolic parameters are consistent with Grade I diastolic dysfunction (impaired relaxation). The average left ventricular global longitudinal strain is -20.6 %. The global longitudinal strain is normal.  2. Right ventricular systolic function is normal. The right ventricular size is normal.  3. The mitral valve is normal in structure. Trivial mitral valve regurgitation. No evidence of mitral stenosis.  4. Poor interogation of AV peak gradient 8 mmHg . The aortic valve is tricuspid. There is moderate calcification of the aortic valve. There is moderate thickening of the aortic valve. Aortic valve regurgitation is not visualized. Mild aortic valve stenosis.  5. Aortic dilatation noted. There is moderate dilatation of the ascending aorta, measuring 43 mm.  6. The inferior vena cava is normal in size with greater than 50% respiratory variability, suggesting right atrial pressure of 3 mmHg. FINDINGS  Left Ventricle: Left ventricular ejection fraction, by estimation, is 60 to 65%. The left ventricle has normal function. The left ventricle has no regional wall motion abnormalities. The average left ventricular global longitudinal strain is -20.6 %. Strain was performed and the global  longitudinal strain is normal. The left ventricular internal cavity size was normal in size. There is mild left ventricular hypertrophy. Left ventricular diastolic parameters are consistent with Grade I diastolic dysfunction (impaired relaxation). Right Ventricle: The right ventricular size is normal. No increase in right ventricular wall thickness. Right ventricular systolic function is normal. Left Atrium: Left atrial size was normal in size. Right Atrium: Right atrial size was normal in size. Pericardium: There is no evidence of pericardial effusion. Mitral Valve: The mitral valve is normal in structure. Trivial mitral valve regurgitation. No evidence of mitral valve stenosis. Tricuspid Valve: The tricuspid valve is normal in structure. Tricuspid valve regurgitation is not demonstrated. No evidence of tricuspid stenosis. Aortic Valve: Poor interogation of AV peak gradient 8 mmHg. The aortic valve is tricuspid. There is moderate calcification of the aortic valve. There is moderate thickening of the aortic valve. Aortic valve regurgitation is not visualized. Mild aortic stenosis is present. Aortic valve peak gradient measures 9.7 mmHg. Pulmonic Valve: The pulmonic valve was normal in structure. Pulmonic valve regurgitation is not visualized. No evidence of pulmonic stenosis. Aorta: Aortic dilatation noted. There is moderate dilatation of the ascending aorta, measuring 43 mm. Venous: The inferior vena cava is normal in size with greater than 50% respiratory variability, suggesting right atrial pressure of 3 mmHg. IAS/Shunts: The interatrial septum appears to be lipomatous. No atrial level shunt detected by color flow Doppler. Additional Comments: 3D was performed not requiring image post processing on an independent workstation and was indeterminate.  LEFT VENTRICLE PLAX 2D LVIDd:         4.30 cm      Diastology LVIDs:         2.90 cm      LV e' medial:   5.44 cm/s LV PW:         1.10 cm  LV E/e' medial: 13.9 LV  IVS:        1.30 cm LVOT diam:     1.90 cm      2D Longitudinal Strain LVOT Area:     2.84 cm     2D Strain GLS Avg:     -20.6 %  LV Volumes (MOD) LV vol d, MOD A2C: 116.0 ml LV vol d, MOD A4C: 97.0 ml LV vol s, MOD A2C: 41.9 ml LV vol s, MOD A4C: 36.3 ml LV SV MOD A2C:     74.1 ml LV SV MOD A4C:     97.0 ml LV SV MOD BP:      67.1 ml RIGHT VENTRICLE          IVC RV Basal diam:  2.90 cm  IVC diam: 1.50 cm                           PULMONARY VEINS                          Systolic Velocity: 74.60 cm/s LEFT ATRIUM             Index        RIGHT ATRIUM           Index LA diam:        3.30 cm 1.69 cm/m   RA Area:     13.00 cm LA Vol (A2C):   48.9 ml 24.99 ml/m  RA Volume:   26.50 ml  13.54 ml/m LA Vol (A4C):   28.0 ml 14.31 ml/m LA Biplane Vol: 37.2 ml 19.01 ml/m  AORTIC VALVE AV Area (Vmax): 1.60 cm AV Vmax:        155.67 cm/s AV Peak Grad:   9.7 mmHg LVOT Vmax:      88.05 cm/s  AORTA Ao Root diam: 3.40 cm Ao Asc diam:  4.30 cm MITRAL VALVE MV Area (PHT): 3.08 cm    SHUNTS MV Decel Time: 247 msec    Systemic Diam: 1.90 cm MV E velocity: 75.47 cm/s MV A velocity: 98.03 cm/s MV E/A ratio:  0.77 Maude Emmer MD Electronically signed by Maude Emmer MD Signature Date/Time: 09/18/2024/12:34:20 PM    Final    DG Chest Portable 1 View Result Date: 09/17/2024 EXAM: 1 VIEW(S) XRAY OF THE CHEST 09/17/2024 11:18:34 AM COMPARISON: 02/26/2024 CLINICAL HISTORY: SOB FINDINGS: LUNGS AND PLEURA: No focal pulmonary opacity. No pleural effusion. No pneumothorax. HEART AND MEDIASTINUM: Aortic calcification. No acute abnormality of the cardiac and mediastinal silhouettes. 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 IMPRESSION: 1. No acute cardiopulmonary process to explain shortness of breath. Electronically signed by: Waddell Calk MD 09/17/2024 11:21 AM EST RP Workstation: [id number]        Scheduled Meds:  allopurinol   200 mg Oral Daily    aspirin  EC  81 mg Oral QHS   atorvastatin   80 mg Oral QHS   diltiazem   120 mg Oral Daily   latanoprost   1 drop Both Eyes QHS   sodium chloride  flush  3 mL Intravenous Q12H   Continuous Infusions:  sodium chloride  40 mL/hr at 09/18/24 1149   heparin  950 Units/hr (09/18/24 1805)     LOS: 1 day    Time spent: 45 minutes spent on chart review, discussion with nursing staff, consultants, updating family and interview/physical exam; more than 50% of that time was  spent in counseling and/or coordination of care.    Harlene RAYMOND Bowl, DO [facility] Available via Epic secure chat 7am-7pm After these hours, please refer to coverage provider listed on [email or web address] 09/19/2024, 10:01 AM

## 2024-09-19 NOTE — Consult Note (Deleted)
Note type entered in error. ° °

## 2024-09-20 ENCOUNTER — Inpatient Hospital Stay

## 2024-09-20 ENCOUNTER — Other Ambulatory Visit (HOSPITAL_COMMUNITY): Payer: Self-pay

## 2024-09-20 ENCOUNTER — Other Ambulatory Visit: Payer: Self-pay | Admitting: Cardiology

## 2024-09-20 DIAGNOSIS — I480 Paroxysmal atrial fibrillation: Secondary | ICD-10-CM

## 2024-09-20 LAB — CBC
HCT: 43.5 % (ref 39.0–52.0)
Hemoglobin: 14.1 g/dL (ref 13.0–17.0)
MCH: 27.1 pg (ref 26.0–34.0)
MCHC: 32.4 g/dL (ref 30.0–36.0)
MCV: 83.7 fL (ref 80.0–100.0)
Platelets: 215 K/uL (ref 150–400)
RBC: 5.2 MIL/uL (ref 4.22–5.81)
RDW: 15.1 % (ref 11.5–15.5)
WBC: 8.8 K/uL (ref 4.0–10.5)
nRBC: 0 % (ref 0.0–0.2)

## 2024-09-20 LAB — BASIC METABOLIC PANEL WITH GFR
Anion gap: 9 (ref 5–15)
BUN: 19 mg/dL (ref 8–23)
CO2: 21 mmol/L — ABNORMAL LOW (ref 22–32)
Calcium: 8.4 mg/dL — ABNORMAL LOW (ref 8.9–10.3)
Chloride: 107 mmol/L (ref 98–111)
Creatinine, Ser: 1.46 mg/dL — ABNORMAL HIGH (ref 0.61–1.24)
GFR, Estimated: 49 mL/min — ABNORMAL LOW (ref >60)
Glucose, Bld: 98 mg/dL (ref 70–99)
Potassium: 4.1 mmol/L (ref 3.5–5.1)
Sodium: 137 mmol/L (ref 135–145)

## 2024-09-20 MED ORDER — CLOPIDOGREL BISULFATE 75 MG PO TABS
75.0000 mg | ORAL_TABLET | Freq: Every day | ORAL | 0 refills | Status: DC
  Filled 2024-09-20: qty 90, 90d supply, fill #0

## 2024-09-20 NOTE — Progress Notes (Addendum)
 Progress Note  Patient Name: Logan Wright Date of Encounter: 09/20/2024 [facility] Cardiologist: Maude Emmer, MD   Interval Summary    Doing well this morning, no issues overnight. Answered pt and wife questions and concerns regarding cath yesterday.   Vital Signs Vitals:   09/19/24 2018 09/19/24 2336 09/20/24 0526 09/20/24 0808  BP: 130/77 (!) 143/88 139/73 123/68  Pulse: 66  60 65  Resp: 20 17 19 18   Temp: 98.2 F (36.8 C) 98.5 F (36.9 C) 98.7 F (37.1 C) 98.2 F (36.8 C)  TempSrc: Oral Oral Oral Oral  SpO2: 99% 99% 97% 96%  Weight:      Height:        Intake/Output Summary (Last 24 hours) at 09/20/2024 0905 Last data filed at 09/20/2024 9187 Gross per 24 hour  Intake 750 ml  Output 250 ml  Net 500 ml      09/18/2024    7:09 PM 09/17/2024    2:34 PM 09/17/2024   10:48 AM  Last 3 Weights  Weight (lbs) 179 lb 185 lb 1.6 oz 178 lb  Weight (kg) 81.194 kg 83.961 kg 80.74 kg      Telemetry/ECG   Sinus Rhythm - Personally Reviewed  Physical Exam  GEN: No acute distress.   Neck: No JVD Cardiac: RRR, no murmurs, rubs, or gallops.  Respiratory: Clear to auscultation bilaterally. GI: Soft, nontender, non-distended  MS: No edema VAS: right radial cath site stable   Assessment & Plan   78 y.o. 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 Presented back to [facility] ED on 09/17/2024 for evaluation of a syncopal episode while sitting in his car with associated chest pain at that time. EMS note states 12 lead was being placed as monitor pads were being accessed for application. No radial pulses were present at this time with thready carotid pulse. As monitor pads were  connected, patient heart rate noted to drop to 38 on the monitor with no palpable pulses. As soon as EMS personnel performed one chest compression, patient screamed and became alert and oriented times four, GCS 15. Reported having gone to workout and was biking for over 45 minutes but had not consumed food or fluids. He received a 1 L fluid bolus while in the ED and given his elevated troponin values, was started on IV Heparin . Transferred to [facility] for further evaluation  NSTEMI Syncope -- With EMS reportedly was hypotensive with pressures in the 90s.  Also had transient bradycardia while and route but subsequent heart rates elevated in the 140s with notable atrial fibrillation with RVR. -- hsTn peaked at 2003 -- Echocardiogram with LVEF of 60 to 65%, mild LVH, grade 1 diastolic dysfunction, normal RV and mild AS -- Underwent cardiac catheterization 12/8 with successful FFR guided PCI to proximal RCA (FFR 0.7), and DES x 1.  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  Though short segment occlusion could be amenable to PCI if recurrent anginal symptoms that would consider drug-coated balloon at later date -- Recommendations for triple therapy with aspirin , Plavix  and apixaban  for 1 month then drop aspirin  with continuation of Plavix  and apixaban   for total of 6 months -- Continue aspirin , Plavix , atorvastatin  80 mg daily  Paroxysmal atrial fibrillation Transient bradycardia -- Noted to be having more frequent episodes of paroxysmal atrial fibrillation which required DCCV 06/2024 -- Noted to be in atrial fibrillation at the time of admission but converted to sinus rhythm afternoon of 12/7 -- Continue Cardizem  CD 120 mg daily, Eliquis  5 mg twice daily -- plan for outpatient cardiac monitor at discharge   HTN -- well controlled -- continue cardizem  120mg  daily  HLD -- LDL 66, trig 179  -- continue atorvastatin  80mg   daily  Carotid Artery Stenosis s/p TCAR 05/2023, hx of stroke, 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  -- Continue ASA and statin therapy  CKD stage 3a -- baseline Cr 1.3-1.4, 1.47 today  Will arrange outpatient follow up   For questions or updates, please contact [facility] Please consult [email or web address] for contact info under     Signed, Manuelita Rummer, NP

## 2024-09-20 NOTE — Progress Notes (Signed)
 Discharge instructions given to pt & wife including but not limited to radial site care, medications, when to call 911, f/u appointments & etc. Pt verbalized understanding with teach back. All questions answered. Pt's IVs were removed & telemetry removed.  All belongings gathered & sent with the pt. Neziah Braley R, RN

## 2024-09-20 NOTE — TOC CM/SW Note (Signed)
 Transition of Care [facility]) - Inpatient Brief Assessment   Patient Details  Name: Logan Wright MRN: [id number] Date of Birth: 1946-01-07  [facility] CM/SW Contact:    Sudie Erminio Deems, RN Phone Number: 09/20/2024, 10:54 AM   Clinical Narrative: Patient presented for syncope- PTA patient was from home with spouse. Spouse at the bedside during the visit. Patient states he does not use any DME and has no home health services. Patient has insurance and PCP. No home needs identified.  Transition of Care Asessment: Insurance and Status: Insurance coverage has been reviewed Patient has primary care physician: Yes Home environment has been reviewed: reviewed Prior level of function:: independent Prior/Current Home Services: No current home services Social Drivers of Health Review: SDOH reviewed no interventions necessary Readmission risk has been reviewed: Yes Transition of care needs: no transition of care needs at this time

## 2024-09-20 NOTE — Progress Notes (Signed)
 14d zio ordered for pAF

## 2024-09-20 NOTE — Progress Notes (Unsigned)
Enrolled for [employer] to mail a ZIO XT long term holter monitor to the patients address on file.   Dr. Rosemary Holms to read.

## 2024-09-20 NOTE — Discharge Summary (Signed)
 Physician Discharge Summary  Logan Wright FMW:[id number] DOB: October 14, 1945 DOA: 09/17/2024  PCP: Dayna Motto, DO  Admit date: 09/17/2024 Discharge date: 09/20/2024  Admitted From:  Discharge disposition: Home   Recommendations for Outpatient Follow-Up:   Needs triple therapy with aspirin /Plavix /apixaban  for 1 month then aspirin  to be dropped and continue Plavix /Eliquis  for total of 6 months If patient has recurrent anginal symptoms then will need a another stent in the [facility] cardiology follow-up Cardiology plans outpatient cardiac monitor at discharge for paroxysmal A-fib burden BMP 1 week re- creatinine   Discharge Diagnosis:   Principal Problem:   Episode of unresponsiveness Active Problems:   PAF (paroxysmal atrial fibrillation) (HCC)   Hyperkalemia   Essential hypertension   Chronic kidney disease, stage III (moderate) (HCC)   History of prediabetes   Hyperlipidemia   Non-ST elevation (NSTEMI) myocardial infarction [facility])   Syncope and collapse    Discharge Condition: Improved.  Diet recommendation: Low sodium, heart healthy.  Wound care: None.  Code status: Full.   History of Present Illness:   Logan Wright is a 78 y.o. male with medical history significant of paroxysmal A-fib, hypertension, HLD, CAD, ascending aorta, right breast cancer s/p masectomy presents after having episode of unresponsiveness.   Patient recalls getting up this morning and going to the gym and riding the stationary bike for approximately 45 minutes without eating breakfast or drinking any fluids he did take his morning medications which included Cardizem .  He recalls going to the gas station to get a cup of coffee and thereafter the next thing he recalls is waking up in the hospital.  There is reported that patient was found unresponsive sitting in his car which it was unclear how long he had been there.  EMS reported patient's heart rates dropped into the 30s at 1 point for  which they attempted to start compressions but stopped quickly as the patient screamed.  Patient does report having some discomfort with taking a deep breath in, but thinks it secondary to where they were doing compressions.    Hospital Course by Problem:   Episode of unresponsiveness Patient presents after having episode of unresponsiveness while at the gas station.  Patient was found with heart rates down into the 30s after reportedly working out before eating or drinking anything this morning.  Initial high-sensitivity troponin negative.  Compressions started and stopped by EMS shortly after patient woke up screaming.  Lower suspicion for pulmonary embolism as patient reports compliance with Eliquis . Echocardiogram with LVEF of 60 to 65%, mild LVH, grade 1 diastolic dysfunction, normal RV and mild AS -- Underwent cardiac catheterization 12/8 with successful FFR guided PCI to proximal RCA (FFR 0.7), and DES x 1.  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  Though short segment occlusion could be amenable to PCI if recurrent anginal symptoms that would consider drug-coated balloon at later date -- Recommendations for triple therapy with aspirin , Plavix  and apixaban  for 1 month then drop aspirin  with continuation of Plavix  and apixaban  for total of 6 months   Paroxysmal atrial fibrillation Patient was found in atrial fibrillation with heart rates in the 110s.  TSH noted to be within normal limits.   -converted to sinus - Continue Cardizem  - Resume Eliquis  - Plan for outpatient cardiac monitor   Hyperkalemia -resolved   Essential hypertension Blood pressures currently maintained. - Continue Cardizem   Chronic kidney disease stage IIIa Creatinine noted to be 1.47 with BUN 24. - stable -hydrate prior to heart cath in AM-Will need outpatient follow-up of creatinine   History of prediabetes On admission  glucose noted to be 116.  Last available hemoglobin A1c noted to be 5.5 when checked back in 04/2023. - Resume home meds   Hyperlipidemia - Continue statin    Medical Consultants:   Cardiology   Discharge Exam:   Vitals:   09/20/24 0526 09/20/24 0808  BP: 139/73 123/68  Pulse: 60 65  Resp: 19 18  Temp: 98.7 F (37.1 C) 98.2 F (36.8 C)  SpO2: 97% 96%   Vitals:   09/19/24 2018 09/19/24 2336 09/20/24 0526 09/20/24 0808  BP: 130/77 (!) 143/88 139/73 123/68  Pulse: 66  60 65  Resp: 20 17 19 18   Temp: 98.2 F (36.8 C) 98.5 F (36.9 C) 98.7 F (37.1 C) 98.2 F (36.8 C)  TempSrc: Oral Oral Oral Oral  SpO2: 99% 99% 97% 96%  Weight:      Height:        General exam: Appears calm and comfortable.   The results of significant diagnostics from this hospitalization (including imaging, microbiology, ancillary and laboratory) are listed below for reference.     Procedures and Diagnostic Studies:   ECHOCARDIOGRAM COMPLETE Result Date: 09/18/2024    ECHOCARDIOGRAM REPORT   Patient Name:   Logan Wright Date of Exam: 09/18/2024 Medical Rec #:  [id number]      Height:       67.0 in Accession #:    [id number]     Weight:       185.1 lb Date of Birth:  1945/11/30      BSA:          1.957 m Patient Age:    78 years       BP:           141/62 mmHg Patient Gender: M              HR:           66 bpm. Exam Location:  Inpatient Procedure: 2D Echo, Color Doppler, Cardiac Doppler and Strain Analysis (Both            Spectral and Color Flow Doppler were utilized during procedure). Indications:    Atrial Fibrillation  History:        Patient has prior history of Echocardiogram examinations, most                 recent 05/06/2023. Stroke and TIA, Arrythmias:Atrial Fibrillation                 and Dysrhythmia, Signs/Symptoms:Chest Pain; Risk                 Factors:Hypertension and Dyslipidemia. Breast Cancer, CKD stage                 3.  Sonographer:    Logan Shove RDCS Referring Phys: [phone] RONDELL  A SMITH IMPRESSIONS  1. Left ventricular ejection fraction, by estimation, is 60 to 65%. The left ventricle has normal function. The left ventricle has no regional wall motion abnormalities. There is mild left ventricular hypertrophy. Left ventricular diastolic parameters are consistent with Grade I diastolic dysfunction (impaired relaxation). The average left ventricular global longitudinal strain is -20.6 %. The global longitudinal strain is normal.  2. Right ventricular systolic function is normal. The right ventricular size is normal.  3. The  mitral valve is normal in structure. Trivial mitral valve regurgitation. No evidence of mitral stenosis.  4. Poor interogation of AV peak gradient 8 mmHg . The aortic valve is tricuspid. There is moderate calcification of the aortic valve. There is moderate thickening of the aortic valve. Aortic valve regurgitation is not visualized. Mild aortic valve stenosis.  5. Aortic dilatation noted. There is moderate dilatation of the ascending aorta, measuring 43 mm.  6. The inferior vena cava is normal in size with greater than 50% respiratory variability, suggesting right atrial pressure of 3 mmHg. FINDINGS  Left Ventricle: Left ventricular ejection fraction, by estimation, is 60 to 65%. The left ventricle has normal function. The left ventricle has no regional wall motion abnormalities. The average left ventricular global longitudinal strain is -20.6 %. Strain was performed and the global longitudinal strain is normal. The left ventricular internal cavity size was normal in size. There is mild left ventricular hypertrophy. Left ventricular diastolic parameters are consistent with Grade I diastolic dysfunction (impaired relaxation). Right Ventricle: The right ventricular size is normal. No increase in right ventricular wall thickness. Right ventricular systolic function is normal. Left Atrium: Left atrial size was normal in size. Right Atrium: Right atrial size was normal in size.  Pericardium: There is no evidence of pericardial effusion. Mitral Valve: The mitral valve is normal in structure. Trivial mitral valve regurgitation. No evidence of mitral valve stenosis. Tricuspid Valve: The tricuspid valve is normal in structure. Tricuspid valve regurgitation is not demonstrated. No evidence of tricuspid stenosis. Aortic Valve: Poor interogation of AV peak gradient 8 mmHg. The aortic valve is tricuspid. There is moderate calcification of the aortic valve. There is moderate thickening of the aortic valve. Aortic valve regurgitation is not visualized. Mild aortic stenosis is present. Aortic valve peak gradient measures 9.7 mmHg. Pulmonic Valve: The pulmonic valve was normal in structure. Pulmonic valve regurgitation is not visualized. No evidence of pulmonic stenosis. Aorta: Aortic dilatation noted. There is moderate dilatation of the ascending aorta, measuring 43 mm. Venous: The inferior vena cava is normal in size with greater than 50% respiratory variability, suggesting right atrial pressure of 3 mmHg. IAS/Shunts: The interatrial septum appears to be lipomatous. No atrial level shunt detected by color flow Doppler. Additional Comments: 3D was performed not requiring image post processing on an independent workstation and was indeterminate.  LEFT VENTRICLE PLAX 2D LVIDd:         4.30 cm      Diastology LVIDs:         2.90 cm      LV e' medial:   5.44 cm/s LV PW:         1.10 cm      LV E/e' medial: 13.9 LV IVS:        1.30 cm LVOT diam:     1.90 cm      2D Longitudinal Strain LVOT Area:     2.84 cm     2D Strain GLS Avg:     -20.6 %  LV Volumes (MOD) LV vol d, MOD A2C: 116.0 ml LV vol d, MOD A4C: 97.0 ml LV vol s, MOD A2C: 41.9 ml LV vol s, MOD A4C: 36.3 ml LV SV MOD A2C:     74.1 ml LV SV MOD A4C:     97.0 ml LV SV MOD BP:      67.1 ml RIGHT VENTRICLE          IVC RV Basal diam:  2.90 cm  IVC diam:  1.50 cm                           PULMONARY VEINS                          Systolic Velocity:  74.60 cm/s LEFT ATRIUM             Index        RIGHT ATRIUM           Index LA diam:        3.30 cm 1.69 cm/m   RA Area:     13.00 cm LA Vol (A2C):   48.9 ml 24.99 ml/m  RA Volume:   26.50 ml  13.54 ml/m LA Vol (A4C):   28.0 ml 14.31 ml/m LA Biplane Vol: 37.2 ml 19.01 ml/m  AORTIC VALVE AV Area (Vmax): 1.60 cm AV Vmax:        155.67 cm/s AV Peak Grad:   9.7 mmHg LVOT Vmax:      88.05 cm/s  AORTA Ao Root diam: 3.40 cm Ao Asc diam:  4.30 cm MITRAL VALVE MV Area (PHT): 3.08 cm    SHUNTS MV Decel Time: 247 msec    Systemic Diam: 1.90 cm MV E velocity: 75.47 cm/s MV A velocity: 98.03 cm/s MV E/A ratio:  0.77 Maude Emmer MD Electronically signed by Maude Emmer MD Signature Date/Time: 09/18/2024/12:34:20 PM    Final    DG Chest Portable 1 View Result Date: 09/17/2024 EXAM: 1 VIEW(S) XRAY OF THE CHEST 09/17/2024 11:18:34 AM COMPARISON: 02/26/2024 CLINICAL HISTORY: SOB FINDINGS: LUNGS AND PLEURA: No focal pulmonary opacity. No pleural effusion. No pneumothorax. HEART AND MEDIASTINUM: Aortic calcification. No acute abnormality of the cardiac and mediastinal silhouettes. 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 IMPRESSION: 1. No acute cardiopulmonary process to explain shortness of breath. Electronically signed by: Waddell Calk MD 09/17/2024 11:21 AM EST RP Workstation: [id number]     Labs:   Basic Metabolic Panel: Recent Labs  Lab 09/17/24 1055 09/18/24 0512 09/19/24 0652 09/20/24 0240  NA 141 141 140 137  K 5.2* 4.0 3.8 4.1  CL 108 109 105 107  CO2 18* 23 25 21*  GLUCOSE 116* 92 91 98  BUN 24* 21 20 19   CREATININE 1.47* 1.43* 1.23 1.46*  CALCIUM  8.9 8.5* 8.5* 8.4*  MG 2.2  --   --   --    GFR Estimated Creatinine Clearance: 41.8 mL/min (A) (by C-G formula based on SCr of 1.46 mg/dL (H)). Liver Function Tests: No results for input(s): AST, ALT, ALKPHOS, BILITOT, PROT, ALBUMIN in the last 168  hours. No results for input(s): LIPASE, AMYLASE in the last 168 hours. No results for input(s): AMMONIA in the last 168 hours. Coagulation profile No results for input(s): INR, PROTIME in the last 168 hours.  CBC: Recent Labs  Lab 09/17/24 1055 09/18/24 0512 09/19/24 0652 09/20/24 0240  WBC 9.0 9.8 8.1 8.8  HGB 14.5 13.1 14.0 14.1  HCT 45.9 40.4 42.4 43.5  MCV 86.4 83.3 82.5 83.7  PLT 205 211 193 215   Cardiac Enzymes: No results for input(s): CKTOTAL, CKMB, CKMBINDEX, TROPONINI in the last 168 hours. BNP: Invalid input(s): POCBNP CBG: No results for input(s): GLUCAP in the last 168 hours. D-Dimer Recent Labs    09/17/24 1634  DDIMER 0.54*   Hgb A1c No results for input(s): HGBA1C in the  last 72 hours. Lipid Profile Recent Labs    09/19/24 0652  CHOL 143  HDL 41  LDLCALC 66  TRIG 179*  CHOLHDL 3.5   Thyroid  function studies Recent Labs    09/17/24 1634  TSH 1.844   Anemia work up No results for input(s): VITAMINB12, FOLATE, FERRITIN, TIBC, IRON, RETICCTPCT in the last 72 hours. Microbiology No results found for this or any previous visit (from the past 240 hours).   Discharge Instructions:   Discharge Instructions     Amb Referral to Cardiac Rehabilitation   Complete by: As directed    Diagnosis:  NSTEMI Coronary Stents     After initial evaluation and assessments completed: Virtual Based Care may be provided alone or in conjunction with Phase 2 Cardiac Rehab based on patient barriers.: Yes   Intensive Cardiac Rehabilitation (ICR) [facility] location only OR Traditional Cardiac Rehabilitation (TCR) *If criteria for ICR are not met will enroll in TCR [facility] only): Yes   Diet - low sodium heart healthy   Complete by: As directed    Discharge instructions   Complete by: As directed    Recommendations for triple therapy with aspirin , Plavix  and apixaban  for 1 month then drop aspirin  with continuation of Plavix  and apixaban   for total of 6 months Close cardiology follow up for monitor   Increase activity slowly   Complete by: As directed       Allergies as of 09/20/2024   No Known Allergies      Medication List     TAKE these medications    allopurinol  100 MG tablet Commonly known as: ZYLOPRIM  Take 200 mg by mouth daily.   aspirin  EC 81 MG tablet Take 81 mg by mouth daily. Swallow whole.   atorvastatin  80 MG tablet Commonly known as: LIPITOR  Take 80 mg by mouth at bedtime.   clopidogrel  75 MG tablet Commonly known as: PLAVIX  Take 1 tablet (75 mg total) by mouth daily with breakfast. Start taking on: September 21, 2024   Comirnaty syringe Generic drug: COVID-19 mRNA vaccine (Pf[employer]) Inject 0.3 mLs into the muscle once.   diltiazem  120 MG 24 hr capsule Commonly known as: CARDIZEM  CD Take 1 capsule (120 mg total) by mouth daily.   diphenhydrAMINE -zinc acetate cream Commonly known as: BENADRYL  Apply 1 Application topically 3 (three) times daily as needed for itching.   Eliquis  5 MG Tabs tablet Generic drug: apixaban  Take 1 tablet by mouth twice daily   Fish Oil Ultra 1400 MG Caps Take 1,400-2,800 mg by mouth See admin instructions. Take 1400 mg in the morning and 2800 mg in the evening   Fluzone High-Dose 0.5 ML injection Generic drug: Influenza vac split trivalent PF Inject 0.5 mLs into the muscle once.   GLUCOSAMINE-CHONDROITIN PO Take 2 tablets by mouth daily.   Jardiance  10 MG Tabs tablet Generic drug: empagliflozin  Take 10 mg by mouth daily.   latanoprost  0.005 % ophthalmic solution Commonly known as: XALATAN  Place 1 drop into both eyes at bedtime.   multivitamin tablet Take 1 tablet by mouth daily.   Potassium 99 MG Tabs Take 1 tablet by mouth daily.   tamoxifen  20 MG tablet Commonly known as: NOLVADEX  Take 1 tablet by mouth once daily        Follow-up Information     Dayna Motto, DO Follow up in 1 week(s).   Specialty: Family Medicine Contact  information: [address] [place] [address] [phone]  Time coordinating discharge: 45 min  Signed:  Harlene RAYMOND Bowl DO  [facility] 09/20/2024, 10:39 AM

## 2024-09-20 NOTE — Progress Notes (Signed)
 CARDIAC REHAB PHASE I     Pt resting in bed, feeling well today. Reports tolerating ambulation with no CP, dizziness or SOB. Post MI/stent education including restrictions, risk factors, exercise guidelines, antiplatelet therapy importance, MI booklet, NTG use, heart healthy diet and CRP2 reviewed. All questions and concerns addressed. Will refer to [facility] for CRP2. Plan for discharge home later today.   [id number] Vaughn Asberry Hacking, RN BSN 09/20/2024 10:15 AM

## 2024-09-21 ENCOUNTER — Other Ambulatory Visit: Payer: Self-pay

## 2024-09-21 ENCOUNTER — Encounter (HOSPITAL_BASED_OUTPATIENT_CLINIC_OR_DEPARTMENT_OTHER): Payer: Self-pay | Admitting: Emergency Medicine

## 2024-09-21 ENCOUNTER — Telehealth: Payer: Self-pay | Admitting: Internal Medicine

## 2024-09-21 ENCOUNTER — Encounter: Payer: Self-pay | Admitting: Cardiovascular Disease

## 2024-09-21 ENCOUNTER — Emergency Department (HOSPITAL_BASED_OUTPATIENT_CLINIC_OR_DEPARTMENT_OTHER): Admitting: Radiology

## 2024-09-21 ENCOUNTER — Emergency Department (HOSPITAL_BASED_OUTPATIENT_CLINIC_OR_DEPARTMENT_OTHER)
Admission: EM | Admit: 2024-09-21 | Discharge: 2024-09-22 | Disposition: A | Discharge status: Home / Self Care | Attending: Emergency Medicine | Admitting: Emergency Medicine

## 2024-09-21 DIAGNOSIS — R739 Hyperglycemia, unspecified: Secondary | ICD-10-CM | POA: Insufficient documentation

## 2024-09-21 DIAGNOSIS — R079 Chest pain, unspecified: Secondary | ICD-10-CM | POA: Diagnosis not present

## 2024-09-21 DIAGNOSIS — R0789 Other chest pain: Secondary | ICD-10-CM | POA: Insufficient documentation

## 2024-09-21 DIAGNOSIS — Z955 Presence of coronary angioplasty implant and graft: Secondary | ICD-10-CM | POA: Insufficient documentation

## 2024-09-21 DIAGNOSIS — Z96611 Presence of right artificial shoulder joint: Secondary | ICD-10-CM | POA: Diagnosis not present

## 2024-09-21 LAB — CBC
HCT: 42.8 % (ref 39.0–52.0)
Hemoglobin: 14 g/dL (ref 13.0–17.0)
MCH: 27.2 pg (ref 26.0–34.0)
MCHC: 32.7 g/dL (ref 30.0–36.0)
MCV: 83.1 fL (ref 80.0–100.0)
Platelets: 214 K/uL (ref 150–400)
RBC: 5.15 MIL/uL (ref 4.22–5.81)
RDW: 15.2 % (ref 11.5–15.5)
WBC: 10.5 K/uL (ref 4.0–10.5)
nRBC: 0 % (ref 0.0–0.2)

## 2024-09-21 LAB — BASIC METABOLIC PANEL WITH GFR
Anion gap: 13 (ref 5–15)
BUN: 23 mg/dL (ref 8–23)
CO2: 23 mmol/L (ref 22–32)
Calcium: 9.4 mg/dL (ref 8.9–10.3)
Chloride: 103 mmol/L (ref 98–111)
Creatinine, Ser: 1.2 mg/dL (ref 0.61–1.24)
GFR, Estimated: >60 mL/min (ref >60)
Glucose, Bld: 134 mg/dL — ABNORMAL HIGH (ref 70–99)
Potassium: 4.1 mmol/L (ref 3.5–5.1)
Sodium: 138 mmol/L (ref 135–145)

## 2024-09-21 LAB — TROPONIN T, HIGH SENSITIVITY: Troponin T High Sensitivity: 188 ng/L (ref 0–19)

## 2024-09-21 MED ORDER — ATORVASTATIN CALCIUM 40 MG PO TABS
80.0000 mg | ORAL_TABLET | ORAL | Status: AC
  Administered 2024-09-21: 80 mg via ORAL

## 2024-09-21 MED ORDER — ATORVASTATIN CALCIUM 40 MG PO TABS
80.0000 mg | ORAL_TABLET | Freq: Every day | ORAL | Status: DC
  Filled 2024-09-21: qty 2

## 2024-09-21 MED ORDER — ASPIRIN 81 MG PO TBEC
81.0000 mg | DELAYED_RELEASE_TABLET | Freq: Every day | ORAL | Status: DC
  Filled 2024-09-21: qty 1

## 2024-09-21 MED ORDER — APIXABAN 2.5 MG PO TABS
5.0000 mg | ORAL_TABLET | Freq: Two times a day (BID) | ORAL | Status: DC
  Administered 2024-09-21: 5 mg via ORAL
  Filled 2024-09-21: qty 2

## 2024-09-21 MED ORDER — ASPIRIN 81 MG PO TBEC
81.0000 mg | DELAYED_RELEASE_TABLET | ORAL | Status: AC
  Administered 2024-09-21: 81 mg via ORAL

## 2024-09-21 NOTE — Telephone Encounter (Signed)
 Spoke to patient he stated he started having rt sided chest discomfort appox 5 hours ago.Stated he had a coronary stent this past Monday 5/8.Stated he only has discomfort with exertion.Stated feels like a pulled muscle.No other symptoms.No sob.No fast heart beat.Advised I will send message to [facility] for advice.Advised if discomfort worsens he needs to go to ED to be evaluated.

## 2024-09-21 NOTE — Telephone Encounter (Signed)
°  Pt c/o of Chest Pain: STAT if active (IN THIS MOMENT) CP, including tightness, pressure, jaw pain, shoulder/upper arm/back pain, SOB, nausea, and vomiting.  1. Are you having CP right now (tightness, pressure, or discomfort)? No   2. Are you experiencing any other symptoms (ex. SOB, nausea, vomiting, sweating)? None   3. How long have you been experiencing CP? Today   4. Is your CP continuous or coming and going? Coming and going  5. Have you taken Nitroglycerin? No   6. If CP returns before callback, please consider calling 911. ?    Patient said, he's been experiencing pain in his right pectoral muscle. He had a stent done last Monday. He said the pain feels like strained muscle. He also sent a mychart message

## 2024-09-21 NOTE — ED Triage Notes (Signed)
 Monday had stent in heart  Pain in right side chest into back started at 2pm On and off worse with exertion Minimal pain at triage

## 2024-09-21 NOTE — ED Notes (Signed)
 MD Emil made aware of pt's critical trop result of 188 via face to face.

## 2024-09-21 NOTE — ED Provider Notes (Signed)
 [place] EMERGENCY DEPARTMENT AT [facility] Provider Note   CSN: [id number] Arrival date & time: 09/21/24  2147     Patient presents with: Chest Pain   Logan Wright is a 78 y.o. male.   78 yo M with a chief complaint of right-sided chest pain.  Patient is a couple days post having a cardiac catheterization performed where he had an RCA stent placed.  He said he is been doing well.  Went for a 3 mile walk today.  Had no symptoms with ambulation.  He took a nap and when he woke up and tried to get up he had a sharp pain to the right side of his chest.  Has had it occur multiple times afterwards usually with moving the right arm or bending a certain way.  He called the cardiology clinic and they encouraged him to come to the ED for evaluation.   Chest Pain      Prior to Admission medications   Medication Sig Start Date End Date Taking? Authorizing Provider  allopurinol  (ZYLOPRIM ) 100 MG tablet Take 200 mg by mouth daily.    [provider]  apixaban  (ELIQUIS ) 5 MG TABS tablet Take 1 tablet by mouth twice daily 03/22/24   [name], Aleene PARAS, MD  aspirin  EC 81 MG tablet Take 81 mg by mouth daily. Swallow whole.    [provider]  atorvastatin  (LIPITOR ) 80 MG tablet Take 80 mg by mouth at bedtime.    [provider]  clopidogrel  (PLAVIX ) 75 MG tablet Take 1 tablet (75 mg total) by mouth daily with breakfast. 09/21/24   Juvenal Harlene PENNER, DO  COMIRNATY syringe Inject 0.3 mLs into the muscle once. 07/26/24   [provider]  diltiazem  (CARDIZEM  CD) 120 MG 24 hr capsule Take 1 capsule (120 mg total) by mouth daily. 06/29/24   Dick, Ernest H Jr., NP  diphenhydrAMINE -zinc acetate (BENADRYL ) cream Apply 1 Application topically 3 (three) times daily as needed for itching.    [provider]  empagliflozin  (JARDIANCE ) 10 MG TABS tablet Take 10 mg by mouth daily.    [provider]  FLUZONE HIGH-DOSE 0.5 ML injection Inject 0.5 mLs  into the muscle once. 07/26/24   [provider]  GLUCOSAMINE-CHONDROITIN PO Take 2 tablets by mouth daily.    [provider]  latanoprost  (XALATAN ) 0.005 % ophthalmic solution Place 1 drop into both eyes at bedtime. 12/22/23   [provider]  Multiple Vitamin (MULTIVITAMIN) tablet Take 1 tablet by mouth daily.     [provider]  Omega-3 Fatty Acids (FISH OIL ULTRA) 1400 MG CAPS Take 1,400-2,800 mg by mouth See admin instructions. Take 1400 mg in the morning and 2800 mg in the evening    [provider]  Potassium 99 MG TABS Take 1 tablet by mouth daily.    [provider]  tamoxifen  (NOLVADEX ) 20 MG tablet Take 1 tablet by mouth once daily 12/28/23   Gudena, Vinay, MD    Allergies: Patient has no known allergies.    Review of Systems  Cardiovascular:  Positive for chest pain.    Updated Vital Signs BP (!) 148/73   Pulse 69   Temp 99 F (37.2 C) (Oral)   Resp 20   SpO2 94%   Physical Exam Vitals and nursing note reviewed.  Constitutional:      Appearance: He is well-developed.  HENT:     Head: Normocephalic and atraumatic.  Eyes:     Pupils:  Pupils are equal, round, and reactive to light.  Neck:     Vascular: No JVD.  Cardiovascular:     Rate and Rhythm: Normal rate and regular rhythm.     Heart sounds: No murmur heard.    No friction rub. No gallop.  Pulmonary:     Effort: No respiratory distress.     Breath sounds: No wheezing.  Chest:     Chest wall: Tenderness present.     Comments: Pain about the lateral aspect of the right pectoral muscle causes some grimacing of the patient.  Pain with palpation of the right trapezius cause reproduction of his pain. Abdominal:     General: There is no distension.     Tenderness: There is no abdominal tenderness. There is no guarding or rebound.  Musculoskeletal:        General: Normal range of motion.     Cervical back: Normal range of motion and neck supple.  Skin:     Coloration: Skin is not pale.     Findings: No rash.  Neurological:     Mental Status: He is alert and oriented to person, place, and time.  Psychiatric:        Behavior: Behavior normal.     (all labs ordered are listed, but only abnormal results are displayed) Labs Reviewed  BASIC METABOLIC PANEL WITH GFR - Abnormal; Notable for the following components:      Result Value   Glucose, Bld 134 (*)    All other components within normal limits  TROPONIN T, HIGH SENSITIVITY - Abnormal; Notable for the following components:   Troponin T High Sensitivity 188 (*)    All other components within normal limits  CBC  TROPONIN T, HIGH SENSITIVITY    EKG: None  Radiology: DG Chest 2 View Result Date: 09/21/2024 CLINICAL DATA:  Right-sided chest pain intermittent since 2 p.m. EXAM: DG CHEST 2V COMPARISON:  09/17/2024 FINDINGS: Frontal and lateral views of the chest demonstrate an unremarkable cardiac silhouette. No acute airspace disease, effusion, or pneumothorax. 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 IMPRESSION: 1. No acute intrathoracic process. Electronically Signed   By: Ozell Daring M.D.   On: 09/21/2024 22:41     Procedures   Medications Ordered in the ED  apixaban  (ELIQUIS ) tablet 5 mg (has no administration in time range)  aspirin  EC tablet 81 mg (has no administration in time range)  atorvastatin  (LIPITOR ) tablet 80 mg (has no administration in time range)                                    Medical Decision Making Amount and/or Complexity of Data Reviewed Labs: ordered. Radiology: ordered.  Risk OTC drugs. Prescription drug management.   39 yoM with a chief complaints of right sided chest discomfort.  Atypical in nature and reproduced on exam.  Did not feel like when he required cardiac catheterization and stent placement a couple days ago.  Initial troponin is elevated at 188 but downward trending from 1200 during his  hospitalization.  I discussed case with the cardiology fellow on-call Dr. Melia.  Based on my history and description of physical exam and his interpretation of the EKG thought unlikely to be cardiac in nature.  Did recommend getting a second troponin if continues to downward trend likely could follow-up with cardiology in clinic.  Patient care signed out to Dr. Geroldine, please see their note  for further details of care in the ED.  The patients results and plan were reviewed and discussed.   Any x-rays performed were independently reviewed by myself.   Differential diagnosis were considered with the presenting HPI.  Medications  apixaban  (ELIQUIS ) tablet 5 mg (has no administration in time range)  aspirin  EC tablet 81 mg (has no administration in time range)  atorvastatin  (LIPITOR ) tablet 80 mg (has no administration in time range)    Vitals:   09/21/24 2200 09/21/24 2201 09/21/24 2215 09/21/24 2245  BP:  (!) 176/79 (!) 142/79 (!) 148/73  Pulse: 74 74 74 69  Resp:  18 20 20   Temp:  99 F (37.2 C)    TempSrc:  Oral    SpO2: 95% 97% 95% 94%    Final diagnoses:  Right-sided chest pain    Admission/ observation were discussed with the admitting physician, patient and/or family and they are comfortable with the plan.       Final diagnoses:  Right-sided chest pain    ED Discharge Orders     None          Emil Share, DO 09/21/24 2327

## 2024-09-22 ENCOUNTER — Other Ambulatory Visit: Payer: Self-pay

## 2024-09-22 DIAGNOSIS — I480 Paroxysmal atrial fibrillation: Secondary | ICD-10-CM

## 2024-09-22 DIAGNOSIS — N1831 Chronic kidney disease, stage 3a: Secondary | ICD-10-CM | POA: Diagnosis not present

## 2024-09-22 DIAGNOSIS — Z5189 Encounter for other specified aftercare: Secondary | ICD-10-CM | POA: Diagnosis not present

## 2024-09-22 DIAGNOSIS — M62838 Other muscle spasm: Secondary | ICD-10-CM | POA: Diagnosis not present

## 2024-09-22 DIAGNOSIS — R001 Bradycardia, unspecified: Secondary | ICD-10-CM | POA: Diagnosis not present

## 2024-09-22 DIAGNOSIS — I2510 Atherosclerotic heart disease of native coronary artery without angina pectoris: Secondary | ICD-10-CM | POA: Diagnosis not present

## 2024-09-22 LAB — LIPOPROTEIN A (LPA): Lipoprotein (a): 84.6 nmol/L — ABNORMAL HIGH (ref <75.0)

## 2024-09-22 LAB — TROPONIN T, HIGH SENSITIVITY: Troponin T High Sensitivity: 180 ng/L (ref 0–19)

## 2024-09-22 MED ORDER — NITROGLYCERIN 0.4 MG SL SUBL: 0.4000 mg | SUBLINGUAL_TABLET | SUBLINGUAL | 2 refills | Status: DC | PRN

## 2024-09-22 NOTE — Telephone Encounter (Signed)
 Left voice message to call back 12/11

## 2024-09-22 NOTE — Telephone Encounter (Signed)
 Pt seen in ED 12/10

## 2024-09-22 NOTE — ED Notes (Signed)
 Pt ambulatory to and from restroom with personal footwear applied, no distress noted. Pt sts pain to right scapular region upon standing from stretcher, but reports it soon resolves when walking. Pt denies pain or discomfort at rest. Delta trop sent to lab for analysis. Call bell and pt's significant other remain at bedside. Stretcher in lowest locked position. Awaiting further dispo.

## 2024-09-22 NOTE — Telephone Encounter (Signed)
 Spoke with patient, states he was seen in the ED and got Dr Magdaleno message. NTG prescription sent in and instructions given.

## 2024-09-22 NOTE — Telephone Encounter (Signed)
 Patient returned Dr. Godfrey call.

## 2024-09-22 NOTE — Discharge Instructions (Signed)
 Follow-up with your cardiologist in the next few days, and return to the ER if you develop worsening pain, difficulty breathing, or for other new and concerning symptoms.

## 2024-09-23 ENCOUNTER — Telehealth (HOSPITAL_COMMUNITY): Payer: Self-pay

## 2024-09-23 MED ORDER — APIXABAN 5 MG PO TABS: 5.0000 mg | ORAL_TABLET | Freq: Two times a day (BID) | ORAL | 1 refills | Status: DC

## 2024-09-23 NOTE — Telephone Encounter (Signed)
 Prescription refill request for Eliquis  received. Indication:afib Last office visit:9/25 Scr: 1.20  12/25 Age:78 Weight:81.2  kg  Prescription refilled

## 2024-09-23 NOTE — Telephone Encounter (Signed)
 Called patient to see if he was interested in Cardiac Rehab program.  Pt stated that he was not sure he wanted to participated because he exercises already and was concerned about cost with insurance.  He stated that he would have to see if he wanted to do this after the 10/12/2024 F/U.     Closed Referral.

## 2024-09-27 ENCOUNTER — Ambulatory Visit

## 2024-10-07 ENCOUNTER — Emergency Department (HOSPITAL_BASED_OUTPATIENT_CLINIC_OR_DEPARTMENT_OTHER)

## 2024-10-07 ENCOUNTER — Emergency Department (HOSPITAL_BASED_OUTPATIENT_CLINIC_OR_DEPARTMENT_OTHER)
Admission: EM | Admit: 2024-10-07 | Discharge: 2024-10-08 | Disposition: A | Discharge status: Home / Self Care | Attending: Emergency Medicine | Admitting: Emergency Medicine

## 2024-10-07 ENCOUNTER — Encounter (HOSPITAL_BASED_OUTPATIENT_CLINIC_OR_DEPARTMENT_OTHER): Payer: Self-pay

## 2024-10-07 ENCOUNTER — Telehealth: Payer: Self-pay | Admitting: Physician Assistant

## 2024-10-07 DIAGNOSIS — Z7982 Long term (current) use of aspirin: Secondary | ICD-10-CM | POA: Insufficient documentation

## 2024-10-07 DIAGNOSIS — Z7901 Long term (current) use of anticoagulants: Secondary | ICD-10-CM | POA: Diagnosis not present

## 2024-10-07 DIAGNOSIS — I959 Hypotension, unspecified: Secondary | ICD-10-CM | POA: Insufficient documentation

## 2024-10-07 DIAGNOSIS — I4891 Unspecified atrial fibrillation: Secondary | ICD-10-CM | POA: Insufficient documentation

## 2024-10-07 DIAGNOSIS — R000 Tachycardia, unspecified: Secondary | ICD-10-CM | POA: Insufficient documentation

## 2024-10-07 LAB — CBC WITH DIFFERENTIAL/PLATELET
Abs Immature Granulocytes: 0.02 K/uL (ref 0.00–0.07)
Basophils Absolute: 0 K/uL (ref 0.0–0.1)
Basophils Relative: 0 %
Eosinophils Absolute: 0.1 K/uL (ref 0.0–0.5)
Eosinophils Relative: 2 %
HCT: 45.9 % (ref 39.0–52.0)
Hemoglobin: 14.9 g/dL (ref 13.0–17.0)
Immature Granulocytes: 0 %
Lymphocytes Relative: 32 %
Lymphs Abs: 2.1 K/uL (ref 0.7–4.0)
MCH: 27.3 pg (ref 26.0–34.0)
MCHC: 32.5 g/dL (ref 30.0–36.0)
MCV: 84.2 fL (ref 80.0–100.0)
Monocytes Absolute: 0.8 K/uL (ref 0.1–1.0)
Monocytes Relative: 12 %
Neutro Abs: 3.5 K/uL (ref 1.7–7.7)
Neutrophils Relative %: 54 %
Platelets: 263 K/uL (ref 150–400)
RBC: 5.45 MIL/uL (ref 4.22–5.81)
RDW: 14.9 % (ref 11.5–15.5)
WBC: 6.6 K/uL (ref 4.0–10.5)
nRBC: 0 % (ref 0.0–0.2)

## 2024-10-07 LAB — RESP PANEL BY RT-PCR (RSV, FLU A&B, COVID)  RVPGX2
Influenza A by PCR: NEGATIVE
Influenza B by PCR: NEGATIVE
Resp Syncytial Virus by PCR: NEGATIVE
SARS Coronavirus 2 by RT PCR: NEGATIVE

## 2024-10-07 MED ORDER — ETOMIDATE 2 MG/ML IV SOLN
8.0000 mg | Freq: Once | INTRAVENOUS | Status: AC
  Administered 2024-10-07: 8 mg via INTRAVENOUS
  Filled 2024-10-07: qty 10

## 2024-10-07 MED ORDER — SODIUM CHLORIDE 0.9 % IV BOLUS
1000.0000 mL | Freq: Once | INTRAVENOUS | Status: AC
  Administered 2024-10-07: 1000 mL via INTRAVENOUS

## 2024-10-07 NOTE — ED Notes (Signed)
 Pt placed on CO2 monotor for a cardiovert, 2 LT [place] , Suction at bedside ambu bag at bedside. Will monotor

## 2024-10-07 NOTE — Telephone Encounter (Signed)
 Patient recently underwent PCI and had multiple A-fib.  He went into A-fib with RVR about an hour ago.  He has good cardiac awareness of A-fib.  Last EKG obtained in the emergency room on 09/21/2024 showed a normal sinus rhythm.  He takes the twice daily dosing of Eliquis  religiously.  Current blood pressure is in the 140s systolic.  Current heart rate is about of 150 bpm.  Last dose of Cardizem  CD was early this morning.  I asked him to take another dose of Cardizem .  If after 2 hours his heart rate is still above 120 bpm, he has been instructed to go to the local emergency room.  I think this would be a good candidate to undergo ED cardioversion.

## 2024-10-07 NOTE — ED Provider Notes (Incomplete)
" °  Physical Exam  BP 115/74   Pulse 65   Temp 98.6 F (37 C) (Oral)   Resp (!) 22   SpO2 94%   Physical Exam  Procedures  Procedures  ED Course / MDM    Medical Decision Making Amount and/or Complexity of Data Reviewed Labs: ordered. Radiology: ordered.  Risk Prescription drug management.   29M s/p cardioversion for afib with rvr, needs obs prior to DC.     "

## 2024-10-07 NOTE — ED Notes (Signed)
 Cardiovent done. Pt stable throughout, No distress noted

## 2024-10-07 NOTE — ED Provider Notes (Signed)
 " [place] EMERGENCY DEPARTMENT AT [facility] Provider Note   CSN: [id number] Arrival date & time: 10/07/24  8042     Patient presents with: No chief complaint on file.   Logan Wright is a 78 y.o. male.  With a history of atrial fibrillation on Eliquis  who presents to the ED for atrial fibrillation.  Patient reports that he began to feel off around 1800 tonight.  He noted himself to be tachycardic in the 150s and called his cardiologist who advised him to take another dose of diltiazem  and wait an hour.  Shortly thereafter he became hypotensive and decided to come to the ED for further evaluation.  No fevers chills recent illness or shortness of breath.  Denies chest pain.  Compliant with Eliquis  and has not missed any doses.   HPI     Prior to Admission medications  Medication Sig Start Date End Date Taking? Authorizing Provider  allopurinol  (ZYLOPRIM ) 100 MG tablet Take 200 mg by mouth daily.    [provider]  apixaban  (ELIQUIS ) 5 MG TABS tablet Take 1 tablet (5 mg total) by mouth 2 (two) times daily. 09/23/24   Okey Vina GAILS, MD  aspirin  EC 81 MG tablet Take 81 mg by mouth daily. Swallow whole.    [provider]  atorvastatin  (LIPITOR ) 80 MG tablet Take 80 mg by mouth at bedtime.    [provider]  clopidogrel  (PLAVIX ) 75 MG tablet Take 1 tablet (75 mg total) by mouth daily with breakfast. 09/21/24   Juvenal Harlene PENNER, DO  COMIRNATY syringe Inject 0.3 mLs into the muscle once. 07/26/24   [provider]  diltiazem  (CARDIZEM  CD) 120 MG 24 hr capsule Take 1 capsule (120 mg total) by mouth daily. 06/29/24   Dick, Ernest H Jr., NP  diphenhydrAMINE -zinc acetate (BENADRYL ) cream Apply 1 Application topically 3 (three) times daily as needed for itching.    [provider]  empagliflozin  (JARDIANCE ) 10 MG TABS tablet Take 10 mg by mouth daily.    [provider]  FLUZONE HIGH-DOSE 0.5 ML injection Inject 0.5 mLs into the  muscle once. 07/26/24   [provider]  GLUCOSAMINE-CHONDROITIN PO Take 2 tablets by mouth daily.    [provider]  latanoprost  (XALATAN ) 0.005 % ophthalmic solution Place 1 drop into both eyes at bedtime. 12/22/23   [provider]  Multiple Vitamin (MULTIVITAMIN) tablet Take 1 tablet by mouth daily.     [provider]  nitroGLYCERIN  (NITROSTAT ) 0.4 MG SL tablet Place 1 tablet (0.4 mg total) under the tongue every 5 (five) minutes as needed for chest pain. 09/22/24 12/21/24  Ladona Heinz, MD  Omega-3 Fatty Acids (FISH OIL ULTRA) 1400 MG CAPS Take 1,400-2,800 mg by mouth See admin instructions. Take 1400 mg in the morning and 2800 mg in the evening    [provider]  Potassium 99 MG TABS Take 1 tablet by mouth daily.    [provider]  tamoxifen  (NOLVADEX ) 20 MG tablet Take 1 tablet by mouth once daily 12/28/23   Gudena, Vinay, MD    Allergies: Patient has no known allergies.    Review of Systems  Updated Vital Signs BP 112/69 (BP Location: Right Arm)   Pulse 60   Temp 98 F (36.7 C) (Oral)   Resp (!) 24   SpO2 97%   Physical Exam Vitals and nursing note reviewed.  HENT:     Head: Normocephalic and atraumatic.  Eyes:     Pupils:  Pupils are equal, round, and reactive to light.  Cardiovascular:     Rate and Rhythm: Tachycardia present. Rhythm irregular.  Pulmonary:     Effort: Pulmonary effort is normal.     Breath sounds: Normal breath sounds.  Abdominal:     Palpations: Abdomen is soft.     Tenderness: There is no abdominal tenderness.  Skin:    General: Skin is warm and dry.  Neurological:     Mental Status: He is alert.  Psychiatric:        Mood and Affect: Mood normal.     (all labs ordered are listed, but only abnormal results are displayed) Labs Reviewed  RESP PANEL BY RT-PCR (RSV, FLU A&B, COVID)  [id number]  CBC WITH DIFFERENTIAL/PLATELET  TROPONIN T, HIGH SENSITIVITY    EKG: None  Radiology: DG Chest  Portable 1 View Result Date: 10/07/2024 EXAM: 1 VIEW(S) XRAY OF THE CHEST 10/07/2024 08:28:00 PM COMPARISON: 09/21/2024 CLINICAL HISTORY: FINDINGS: LINES, TUBES AND DEVICES: Multiple overlying monitor wires noted. LUNGS AND PLEURA: No focal pulmonary opacity. No pleural effusion. No pneumothorax. HEART AND MEDIASTINUM: Unchanged cardiomediastinal silhouette. Atherosclerotic plaque. BONES AND SOFT TISSUES: Stable reverse right shoulder arthroplasty. Advanced arthrosis of left shoulder. No acute osseous abnormality. IMPRESSION: 1. No acute cardiopulmonary abnormality. Electronically signed by: Morgane Naveau MD 10/07/2024 09:54 PM EST RP Workstation: [id number]     .Cardioversion  Date/Time: 10/08/2024 11:00 PM  Performed by: Pamella Ozell LABOR, DO Authorized by: Pamella Ozell LABOR, DO   Consent:    Consent obtained:  Verbal and written   Consent given by:  Patient   Risks discussed:  Induced arrhythmia, cutaneous burn, pain and death   Alternatives discussed:  Rate-control medication Universal protocol:    Procedure explained and questions answered to patient or proxy's satisfaction: yes     Immediately prior to procedure a time out was called: yes     Patient identity confirmed:  Verbally with patient Pre-procedure details:    Cardioversion basis:  Emergent   Rhythm:  Atrial fibrillation   Electrode placement:  Anterior-posterior Patient sedated: Yes. Refer to sedation procedure documentation for details of sedation.  Attempt one:    Cardioversion mode:  Synchronous   Waveform:  Biphasic   Shock (Joules):  150   Shock outcome:  Conversion to normal sinus rhythm Post-procedure details:    Patient status:  Awake   Patient tolerance of procedure:  Tolerated well, no immediate complications .Sedation  Date/Time: 10/08/2024 10:55 PM  Performed by: Pamella Ozell LABOR, DO Authorized by: Pamella Ozell LABOR, DO   Consent:    Consent obtained:  Verbal and written   Consent given by:   Patient   Risks discussed:  Allergic reaction, dysrhythmia, inadequate sedation, nausea, prolonged sedation necessitating reversal and prolonged hypoxia resulting in organ damage   Alternatives discussed:  Analgesia without sedation Universal protocol:    Immediately prior to procedure, a time out was called: yes     Patient identity confirmed:  Provided demographic data Indications:    Procedure performed:  Cardioversion Pre-sedation assessment:    Time since last food or drink:  1800   NPO status caution: urgency dictates proceeding with non-ideal NPO status     ASA classification: class 2 - patient with mild systemic disease     Mallampati score:  I - soft palate, uvula, fauces, pillars visible   Pre-sedation assessments completed and reviewed: pre-procedure airway patency not reviewed, pre-procedure cardiovascular function not reviewed, pre-procedure hydration status not reviewed, pre-procedure mental status  not reviewed and pre-procedure nausea and vomiting status not reviewed   A pre-sedation assessment was completed prior to the start of the procedure Immediate pre-procedure details:    Reassessment: Patient reassessed immediately prior to procedure   Procedure details (see MAR for exact dosages):    Preoxygenation:  Nasal cannula   Sedation:  Etomidate    Intended level of sedation: deep   Intra-procedure monitoring:  Blood pressure monitoring and continuous capnometry   Intra-procedure events: none     Total Provider sedation time (minutes):  12 Post-procedure details:   A post-sedation assessment was completed following the completion of the procedure.   Attendance: Constant attendance by certified staff until patient recovered     Recovery: Patient returned to pre-procedure baseline     Post-sedation assessments completed and reviewed: post-procedure airway patency not reviewed, post-procedure cardiovascular function not reviewed, post-procedure mental status not reviewed,  post-procedure nausea and vomiting status not reviewed, pain score not reviewed and post-procedure respiratory function not reviewed     Patient is stable for discharge or admission: no     Procedure completion:  Tolerated well, no immediate complications .Critical Care  Performed by: Pamella Ozell LABOR, DO Authorized by: Pamella Ozell LABOR, DO   Critical care provider statement:    Critical care time (minutes):  30   Critical care was necessary to treat or prevent imminent or life-threatening deterioration of the following conditions:  Cardiac failure   Critical care was time spent personally by me on the following activities:  Development of treatment plan with patient or surrogate, discussions with consultants, evaluation of patient's response to treatment, examination of patient, ordering and review of laboratory studies, ordering and review of radiographic studies, ordering and performing treatments and interventions, pulse oximetry, re-evaluation of patient's condition, review of old charts and obtaining history from patient or surrogate   I assumed direction of critical care for this patient from another provider in my specialty: no      Medications Ordered in the ED  sodium chloride  0.9 % bolus 1,000 mL (0 mLs Intravenous Stopped 10/07/24 2128)  etomidate  (AMIDATE ) injection 8 mg (8 mg Intravenous Given 10/07/24 2250)                                    Medical Decision Making 78 year old male with history as above presenting in A-fib RVR.  Initial blood pressures as well.  Rate in the 150s 160s.  He took diltiazem  at home and rate did improve.  Gave him 1 L IV fluids but still in A-fib.  Discussed with Dr. Michele (cardiology) who agrees with plan for electrocardioversion here and discharge if he remains stable and normal sinus rhythm.  Procedural sedation with etomidate  electrocardioversion with 1 synchronized 150 J shock.  Tolerated procedure well.  He will follow-up with PCP and  cardiology team  Amount and/or Complexity of Data Reviewed Labs: ordered. Radiology: ordered.  Risk Prescription drug management.        Final diagnoses:  Atrial fibrillation with rapid ventricular response [facility])    ED Discharge Orders     None          Pamella Ozell LABOR, DO 10/08/24 0037  "

## 2024-10-07 NOTE — ED Triage Notes (Signed)
 Pt states feeling 'off' at 1755pm. Advised he used cardiac reader, found to be in afib (150-160), BP 145/80. Called cardiologist, advised to take another dilt (took one this AM), BP then 90/50  Denies CP, dizziness, additional symptoms, keeps saying I just feel off.

## 2024-10-08 NOTE — Discharge Instructions (Signed)
 You were seen in the Emergency Department for fast A-fib We cardioverted you back into normal sinus rhythm Continue taking all previous prescribed medications Follow-up with your cardiologist Return to the emergency room for chest pain recurrent fast A-fib or other concerns

## 2024-10-12 ENCOUNTER — Ambulatory Visit: Admitting: Physician Assistant

## 2024-10-19 ENCOUNTER — Ambulatory Visit
Admission: RE | Admit: 2024-10-19 | Discharge: 2024-10-19 | Disposition: A | Discharge status: Home / Self Care | Source: Ambulatory Visit | Attending: Hematology and Oncology | Admitting: Hematology and Oncology

## 2024-10-19 DIAGNOSIS — Z1231 Encounter for screening mammogram for malignant neoplasm of breast: Secondary | ICD-10-CM

## 2024-10-24 ENCOUNTER — Ambulatory Visit: Payer: Self-pay | Admitting: Cardiology

## 2024-10-24 DIAGNOSIS — I480 Paroxysmal atrial fibrillation: Secondary | ICD-10-CM | POA: Diagnosis not present

## 2024-10-24 NOTE — Progress Notes (Signed)
 Seeing you on 1/15, known PAF. Several episodes of SVT and Afib. May need EP input.  Thanks MJP

## 2024-10-26 NOTE — Progress Notes (Signed)
 " Cardiology Office Note   Date:  10/27/2024  ID:  Logan Wright, DOB 1946/02/17, MRN [id number] PCP: Dayna Motto, DO  [facility] Providers Cardiologist:  None   History of Present Illness Logan Wright is a 79 y.o. male with a past medical history of hypertension and atypical chest pain who is here for follow-up appointment.  History includes regular exercise with aerobic and weightlifting.  Does [employer] several times per week and averages about 5 days a week of exercise.  Having some left knee problems so had been cycling more.  No chest pain or shortness of breath with exercise.  He has been followed by Dr. Alveta since 2022.  He has been treated for hypertension and atrial fibrillation.  Unfortunately had right breast cancer s/p mastectomy in June 2021.  Was diagnosed with atrial fibrillation.  Was treated with rate control and anticoagulation.  He did not have a cardioversion but converted on his own to normal sinus rhythm.  Referred for sleep study and had tried to get him in with Dr. Shlomo at some point.  He has lost 30 pounds.  CHA2DS2-VASc score of 3.  When he was last seen he was getting a lot of exercise and was hiking in [place]  and [place].  No chest pain or dyspnea with his level activity.  Lipid panel looked good with LDL 78, HDL 42, triglycerides 93 and total cholesterol 138.  Recent cardiac monitor 10/24/2024 showing known PAF.  Several episodes of SVT.  Electrophysiology consult likely needed.  Today, he presents with a hx of coronary artery disease who presents for follow-up after stent placement.  Since the stent placement in the right coronary artery for a significant blockage, he notes improved energy and mental clarity. A 99% lesion in the first diagonal branch of the LAD was not stented due to small vessel size and collateral flow.  He has brief, non-painful sensations during and outside of workouts that he is unsure are cardiac. He  denies sharp chest pain, shortness of breath, or palpitations.  He rides a stationary bike for 30 minutes six days a week and does circuit training four days a week, and is cautious to avoid overexertion since the stent. He and his wife scuba dive to depths of 50 to 60 feet, and he canceled a planned trip to reassess his cardiac status post-stent.  His wife is transitioning toward a vegetarian diet, which he finds challenging given his preference for fried foods and pizza, which he has avoided for three years.  Reports no shortness of breath nor dyspnea on exertion. Reports no chest pain, pressure, or tightness. No edema, orthopnea, PND. Reports no palpitations.   Discussed the use of AI scribe software for clinical note transcription with the patient, who gave verbal consent to proceed.  ROS: pertinent ROS in HPI  Studies Reviewed      Zio patch monitor 14 days 09/25/2024 - 10/09/2024: Dominant rhythm: Sinus. HR 43-101 bpm. Avg HR 63 bpm, in sinus rhythm. Atrial Fibrillation occurred (2% burden), ranging from 81-191 bpm (avg of 131 bpm), the longest lasting 4 hours 57 mins with an avg rate of 131 bpm.  29 episodes of SVT, fastest at 197 bpm for 25.1 secs, longest for 8 min 20 secs at 180 bpm. 9.9% isolated SVE, <1% couplet/triplets. 1 episode of VT, fastest at 152 bpm for 7 beats. <1% isolated VE, couplets. No high grade AV block, sinus pause >3sec noted. Supraventricular  Tachycardia was detected within +/- 45 seconds of symptomatic patient event(s).      Physical Exam VS:  BP 122/72   Pulse 72   Ht 5' 7 (1.702 m)   Wt 179 lb (81.2 kg)   SpO2 97%   BMI 28.04 kg/m        Wt Readings from Last 3 Encounters:  10/27/24 179 lb (81.2 kg)  09/18/24 179 lb (81.2 kg)  08/06/24 184 lb 15.5 oz (83.9 kg)    GEN: Well nourished, well developed in no acute distress NECK: No JVD; No carotid bruits CARDIAC: RRR, no murmurs, rubs, gallops RESPIRATORY:  Clear to auscultation without  rales, wheezing or rhonchi  ABDOMEN: Soft, non-tender, non-distended EXTREMITIES:  No edema; No deformity   ASSESSMENT AND PLAN  Coronary artery disease status post right coronary artery stent placement Stent placement improved blood flow. 99% blockage in LAD not stented due to small vessel size and bridging collaterals. High intervention risk due to bifurcation proximity. Symptoms monitored, stress tests optional. Chest sensations likely non-cardiac. - Continue monitoring symptoms for changes. - Consider stress tests every few years. - Referred to cardiac rehab for monitoring during exercise. - Encouraged gradual increase in weightlifting with lower weights and higher repetitions. - Approved scuba diving trip in May.  Essential hypertension Hypertension management ongoing with lifestyle modifications. - Continue current hypertension management plan. - Encouraged adherence to a low-salt diet.  Atrial Fibrillation -no issues recently     Dispo: He can return in 3 months to see MD  Signed, [name] LOISE Fabry, PA-C   "

## 2024-10-27 ENCOUNTER — Ambulatory Visit: Attending: Physician Assistant | Admitting: Physician Assistant

## 2024-10-27 ENCOUNTER — Encounter: Payer: Self-pay | Admitting: Physician Assistant

## 2024-10-27 VITALS — BP 122/72 | HR 72 | Ht 67.0 in | Wt 179.0 lb

## 2024-10-27 DIAGNOSIS — I480 Paroxysmal atrial fibrillation: Secondary | ICD-10-CM | POA: Diagnosis not present

## 2024-10-27 DIAGNOSIS — I10 Essential (primary) hypertension: Secondary | ICD-10-CM | POA: Diagnosis not present

## 2024-10-27 DIAGNOSIS — I471 Supraventricular tachycardia, unspecified: Secondary | ICD-10-CM | POA: Diagnosis not present

## 2024-10-27 MED ORDER — ATORVASTATIN CALCIUM 80 MG PO TABS: 80.0000 mg | ORAL_TABLET | Freq: Every evening | ORAL | 1 refills | Status: AC

## 2024-10-27 MED ORDER — NITROGLYCERIN 0.4 MG SL SUBL: 0.4000 mg | SUBLINGUAL_TABLET | SUBLINGUAL | 2 refills | Status: AC | PRN

## 2024-10-27 MED ORDER — EMPAGLIFLOZIN 10 MG PO TABS: 10.0000 mg | ORAL_TABLET | Freq: Every day | ORAL | 1 refills | Status: AC

## 2024-10-27 MED ORDER — DILTIAZEM HCL ER COATED BEADS 120 MG PO CP24: 120.0000 mg | ORAL_CAPSULE | Freq: Every day | ORAL | 1 refills | Status: AC

## 2024-10-27 MED ORDER — CLOPIDOGREL BISULFATE 75 MG PO TABS: 75.0000 mg | ORAL_TABLET | Freq: Every day | ORAL | 1 refills | Status: AC

## 2024-10-27 MED ORDER — APIXABAN 5 MG PO TABS: 5.0000 mg | ORAL_TABLET | Freq: Two times a day (BID) | ORAL | 1 refills | Status: AC

## 2024-10-27 NOTE — Patient Instructions (Signed)
 Medication Instructions:  Your physician recommends that you continue on your current medications as directed. Please refer to the Current Medication list given to you today. *If you need a refill on your cardiac medications before your next appointment, please call your pharmacy*  Lab Work: None Ordered If you have labs (blood work) drawn today and your tests are completely normal, you will receive your results only by: MyChart Message (if you have MyChart) OR A paper copy in the mail If you have any lab test that is abnormal or we need to change your treatment, we will call you to review the results.  Testing/Procedures: None Ordered  Follow-Up: At [facility], you and your health needs are our priority.  As part of our continuing mission to provide you with exceptional heart care, our providers are all part of one team.  This team includes your primary Cardiologist (physician) and Advanced Practice Providers or APPs (Physician Assistants and Nurse Practitioners) who all work together to provide you with the care you need, when you need it.  Your next appointment:   3-4 month(s)  Provider:   Georganna Archer, MD   We recommend signing up for the patient portal called MyChart.  Sign up information is provided on this After Visit Summary.  MyChart is used to connect with patients for Virtual Visits (Telemedicine).  Patients are able to view lab/test results, encounter notes, upcoming appointments, etc.  Non-urgent messages can be sent to your provider as well.   To learn more about what you can do with MyChart, go to [email or web address].   Other Instructions

## 2024-10-27 NOTE — Addendum Note (Signed)
 Addended by: SEBASTIAN, TEE Y on: 10/27/2024 02:47 PM   Modules accepted: Orders

## 2024-11-02 ENCOUNTER — Telehealth (HOSPITAL_COMMUNITY): Payer: Self-pay

## 2024-11-02 NOTE — Telephone Encounter (Signed)
 Pt called and stated that he wanted to get cardiac rehab stated. Advised that I would open referral and pass to nurse navigator to get cleared. At that point we will call him to schedule and let him know of his insurance responsibility.

## 2024-11-05 ENCOUNTER — Encounter: Payer: Self-pay | Admitting: Physician Assistant

## 2024-11-06 NOTE — Progress Notes (Signed)
 Please clarify the medical condition(s) necessitating the order for: Respiratory Therapy on 10/07/24.   Respiratory therapy needed for electrocardioversion

## 2024-11-14 ENCOUNTER — Telehealth (HOSPITAL_BASED_OUTPATIENT_CLINIC_OR_DEPARTMENT_OTHER): Payer: Self-pay | Admitting: *Deleted

## 2024-11-14 NOTE — Telephone Encounter (Signed)
"  ° °  Pre-operative Risk Assessment    Patient Name: Logan Wright  DOB: [birth year] MRN: [id number]   Date of last office visit: 10/27/2024 Date of next office visit: 01/27/2025  Request for Surgical Clearance    Procedure:  Bilateral upper eyelid biepharoptosis repair and right upper eyelid lesion excision and repair with biopsy  Date of Surgery:  Clearance 11/28/24                                 Surgeon:  Dr. Renzo Zaidlvar Surgeon's Group or Practice Name:  [facility] Phone number:  [phone] Fax number:  [phone]   Type of Clearance Requested:   - Medical  - Pharmacy:  Hold Aspirin  and Apixaban  (Eliquis ) Not indicated   Type of Anesthesia:  Not Indicated   Additional requests/questions:    Signed, Edsel Grayce Sanders   11/14/2024, 1:24 PM   "

## 2024-11-14 NOTE — Telephone Encounter (Signed)
 Please advise holding Eliquis  prior to Bilateral upper eyelid biepharoptosis repair and right upper eyelid lesion excision and repair with biopsy. Last labs 09/2024  Thank you!  AW

## 2024-11-14 NOTE — Telephone Encounter (Signed)
 [name]  You saw this patient on 10/27/24. Per office protocol, will you please comment on medical clearance for Bilateral upper eyelid biepharoptosis repair and right upper eyelid lesion excision and repair with biopsy?  Please route your response to P CV DIV Preop. I will communicate with requesting office once you have given recommendations.   Thank you!  Mardy Pizza, NP

## 2024-11-16 NOTE — Telephone Encounter (Signed)
 I will fax these notes to requesting office to see notes from preop APP [name] Fabry, PAC in regard to blood thinner recommendations.    Please let our office know ASAP if the blood thinners will need to be held as this will need to be re-visited.

## 2024-12-27 ENCOUNTER — Ambulatory Visit: Admitting: Internal Medicine

## 2025-01-27 ENCOUNTER — Ambulatory Visit: Admitting: Student in an Organized Health Care Education/Training Program

## 2025-07-12 ENCOUNTER — Ambulatory Visit: Admitting: Hematology and Oncology
